# Patient Record
Sex: Male | Born: 1935
Health system: Southern US, Community
[De-identification: ages and names within clinical notes are randomized; demographics above are authoritative.]

## PROBLEM LIST (undated history)

## (undated) DIAGNOSIS — R42 Dizziness and giddiness: Secondary | ICD-10-CM

## (undated) DIAGNOSIS — E785 Hyperlipidemia, unspecified: Secondary | ICD-10-CM

## (undated) DIAGNOSIS — E119 Type 2 diabetes mellitus without complications: Secondary | ICD-10-CM

## (undated) DIAGNOSIS — H409 Unspecified glaucoma: Secondary | ICD-10-CM

## (undated) DIAGNOSIS — I219 Acute myocardial infarction, unspecified: Secondary | ICD-10-CM

## (undated) DIAGNOSIS — K219 Gastro-esophageal reflux disease without esophagitis: Secondary | ICD-10-CM

## (undated) DIAGNOSIS — I10 Essential (primary) hypertension: Secondary | ICD-10-CM

## (undated) DIAGNOSIS — M1990 Unspecified osteoarthritis, unspecified site: Secondary | ICD-10-CM

## (undated) DIAGNOSIS — C801 Malignant (primary) neoplasm, unspecified: Secondary | ICD-10-CM

## (undated) HISTORY — DX: Hyperlipidemia, unspecified: E78.5

## (undated) HISTORY — DX: Unspecified glaucoma: H40.9

## (undated) HISTORY — PX: GANGLION CYST EXCISION: SHX1691

## (undated) HISTORY — PX: CIRCUMCISION: SUR203

## (undated) HISTORY — PX: TONSILLECTOMY: SUR1361

## (undated) HISTORY — PX: CATARACT EXTRACTION W/ INTRAOCULAR LENS  IMPLANT, BILATERAL: SHX1307

## (undated) HISTORY — DX: Dizziness and giddiness: R42

## (undated) HISTORY — DX: Type 2 diabetes mellitus without complications: E11.9

## (undated) HISTORY — DX: Essential (primary) hypertension: I10

## (undated) HISTORY — DX: Unspecified osteoarthritis, unspecified site: M19.90

## (undated) HISTORY — PX: VASECTOMY: SHX75

## (undated) HISTORY — DX: Gastro-esophageal reflux disease without esophagitis: K21.9

---

## 2005-08-10 ENCOUNTER — Ambulatory Visit: Payer: Self-pay | Admitting: Urology

## 2005-11-01 ENCOUNTER — Ambulatory Visit: Payer: Self-pay | Admitting: Gastroenterology

## 2009-01-26 ENCOUNTER — Ambulatory Visit: Payer: Self-pay | Admitting: Oncology

## 2009-02-11 LAB — CMP (CANCER CENTER ONLY)
AST: 38 U/L (ref 11–38)
BUN, Bld: 16 mg/dL (ref 7–22)
Calcium: 9.6 mg/dL (ref 8.0–10.3)
Chloride: 101 mEq/L (ref 98–108)
Creat: 1.2 mg/dl (ref 0.6–1.2)
Glucose, Bld: 113 mg/dL (ref 73–118)

## 2009-02-11 LAB — CBC WITH DIFFERENTIAL (CANCER CENTER ONLY)
Eosinophils Absolute: 0.3 10*3/uL (ref 0.0–0.5)
HCT: 38.5 % — ABNORMAL LOW (ref 38.7–49.9)
LYMPH#: 1.5 10*3/uL (ref 0.9–3.3)
LYMPH%: 26.7 % (ref 14.0–48.0)
MCV: 92 fL (ref 82–98)
MONO#: 0.4 10*3/uL (ref 0.1–0.9)
NEUT%: 61.4 % (ref 40.0–80.0)
RBC: 4.2 10*6/uL (ref 4.20–5.70)
WBC: 5.7 10*3/uL (ref 4.0–10.0)

## 2009-02-11 LAB — MORPHOLOGY - CHCC SATELLITE: PLT EST ~~LOC~~: ADEQUATE

## 2009-02-13 LAB — PROTEIN ELECTROPHORESIS, SERUM
Albumin ELP: 61.8 % (ref 55.8–66.1)
Alpha-1-Globulin: 4 % (ref 2.9–4.9)
Alpha-2-Globulin: 13.8 % — ABNORMAL HIGH (ref 7.1–11.8)
Total Protein, Serum Electrophoresis: 7 g/dL (ref 6.0–8.3)

## 2009-02-13 LAB — RETICULOCYTES (CHCC)
ABS Retic: 56 10*3/uL (ref 19.0–186.0)
RBC.: 4.31 MIL/uL (ref 4.22–5.81)
Retic Ct Pct: 1.3 % (ref 0.4–3.1)

## 2009-02-13 LAB — FERRITIN: Ferritin: 343 ng/mL — ABNORMAL HIGH (ref 22–322)

## 2009-02-13 LAB — VITAMIN B12: Vitamin B-12: 1228 pg/mL — ABNORMAL HIGH (ref 211–911)

## 2009-02-13 LAB — IRON AND TIBC: TIBC: 339 ug/dL (ref 215–435)

## 2010-08-25 ENCOUNTER — Ambulatory Visit: Payer: Self-pay | Admitting: Unknown Physician Specialty

## 2011-07-14 HISTORY — PX: MELANOMA EXCISION: SHX5266

## 2013-05-23 DIAGNOSIS — Z8589 Personal history of malignant neoplasm of other organs and systems: Secondary | ICD-10-CM | POA: Insufficient documentation

## 2013-11-25 ENCOUNTER — Ambulatory Visit: Payer: Self-pay | Admitting: Family Medicine

## 2014-04-21 ENCOUNTER — Ambulatory Visit: Payer: Self-pay | Admitting: Family Medicine

## 2014-09-24 DIAGNOSIS — M3501 Sicca syndrome with keratoconjunctivitis: Secondary | ICD-10-CM | POA: Diagnosis not present

## 2014-10-02 DIAGNOSIS — D0471 Carcinoma in situ of skin of right lower limb, including hip: Secondary | ICD-10-CM | POA: Diagnosis not present

## 2014-10-02 DIAGNOSIS — Z85828 Personal history of other malignant neoplasm of skin: Secondary | ICD-10-CM | POA: Diagnosis not present

## 2014-10-02 DIAGNOSIS — D225 Melanocytic nevi of trunk: Secondary | ICD-10-CM | POA: Diagnosis not present

## 2014-10-02 DIAGNOSIS — Z85820 Personal history of malignant melanoma of skin: Secondary | ICD-10-CM | POA: Diagnosis not present

## 2014-10-02 DIAGNOSIS — L570 Actinic keratosis: Secondary | ICD-10-CM | POA: Diagnosis not present

## 2014-10-02 DIAGNOSIS — X32XXXA Exposure to sunlight, initial encounter: Secondary | ICD-10-CM | POA: Diagnosis not present

## 2014-10-02 DIAGNOSIS — D485 Neoplasm of uncertain behavior of skin: Secondary | ICD-10-CM | POA: Diagnosis not present

## 2014-10-07 DIAGNOSIS — E119 Type 2 diabetes mellitus without complications: Secondary | ICD-10-CM | POA: Diagnosis not present

## 2014-10-07 DIAGNOSIS — R740 Nonspecific elevation of levels of transaminase and lactic acid dehydrogenase [LDH]: Secondary | ICD-10-CM | POA: Diagnosis not present

## 2014-10-07 DIAGNOSIS — N400 Enlarged prostate without lower urinary tract symptoms: Secondary | ICD-10-CM | POA: Diagnosis not present

## 2014-10-07 DIAGNOSIS — M109 Gout, unspecified: Secondary | ICD-10-CM | POA: Diagnosis not present

## 2014-10-07 DIAGNOSIS — I10 Essential (primary) hypertension: Secondary | ICD-10-CM | POA: Diagnosis not present

## 2014-10-07 DIAGNOSIS — Z794 Long term (current) use of insulin: Secondary | ICD-10-CM | POA: Diagnosis not present

## 2014-10-07 DIAGNOSIS — E781 Pure hyperglyceridemia: Secondary | ICD-10-CM | POA: Diagnosis not present

## 2014-10-16 DIAGNOSIS — H1012 Acute atopic conjunctivitis, left eye: Secondary | ICD-10-CM | POA: Diagnosis not present

## 2014-11-05 DIAGNOSIS — H4011X2 Primary open-angle glaucoma, moderate stage: Secondary | ICD-10-CM | POA: Diagnosis not present

## 2014-11-18 DIAGNOSIS — D0471 Carcinoma in situ of skin of right lower limb, including hip: Secondary | ICD-10-CM | POA: Diagnosis not present

## 2015-01-08 DIAGNOSIS — E780 Pure hypercholesterolemia: Secondary | ICD-10-CM | POA: Diagnosis not present

## 2015-01-08 DIAGNOSIS — E119 Type 2 diabetes mellitus without complications: Secondary | ICD-10-CM | POA: Diagnosis not present

## 2015-01-08 DIAGNOSIS — N400 Enlarged prostate without lower urinary tract symptoms: Secondary | ICD-10-CM | POA: Diagnosis not present

## 2015-01-08 DIAGNOSIS — Z794 Long term (current) use of insulin: Secondary | ICD-10-CM | POA: Diagnosis not present

## 2015-01-08 DIAGNOSIS — I10 Essential (primary) hypertension: Secondary | ICD-10-CM | POA: Diagnosis not present

## 2015-01-08 DIAGNOSIS — E781 Pure hyperglyceridemia: Secondary | ICD-10-CM | POA: Diagnosis not present

## 2015-01-08 DIAGNOSIS — I213 ST elevation (STEMI) myocardial infarction of unspecified site: Secondary | ICD-10-CM | POA: Diagnosis not present

## 2015-01-08 DIAGNOSIS — R740 Nonspecific elevation of levels of transaminase and lactic acid dehydrogenase [LDH]: Secondary | ICD-10-CM | POA: Diagnosis not present

## 2015-01-16 DIAGNOSIS — H4011X2 Primary open-angle glaucoma, moderate stage: Secondary | ICD-10-CM | POA: Diagnosis not present

## 2015-01-22 DIAGNOSIS — H4011X2 Primary open-angle glaucoma, moderate stage: Secondary | ICD-10-CM | POA: Diagnosis not present

## 2015-03-03 ENCOUNTER — Other Ambulatory Visit: Payer: Self-pay | Admitting: Family Medicine

## 2015-03-03 ENCOUNTER — Other Ambulatory Visit: Payer: Self-pay | Admitting: *Deleted

## 2015-03-03 MED ORDER — GLUCOSE BLOOD VI STRP: ORAL_STRIP | Status: DC

## 2015-03-09 ENCOUNTER — Other Ambulatory Visit: Payer: Self-pay | Admitting: Family Medicine

## 2015-03-09 ENCOUNTER — Telehealth: Payer: Self-pay | Admitting: Family Medicine

## 2015-03-09 MED ORDER — LISINOPRIL 40 MG PO TABS: 40.0000 mg | ORAL_TABLET | Freq: Every day | ORAL | Status: DC

## 2015-03-09 NOTE — Telephone Encounter (Signed)
PT IS NEEDING REFILL ON LISINOPRIL 40MG  TAB 1 TAB EVERY DAY. [facility] IS . [facility] ORDER FOR [employer].

## 2015-03-09 NOTE — Telephone Encounter (Signed)
Sent request to Dr Manuella Ghazi

## 2015-03-10 ENCOUNTER — Telehealth: Payer: Self-pay | Admitting: Family Medicine

## 2015-03-10 NOTE — Telephone Encounter (Signed)
Pt needs [place] to reply to the rx that [facility] sent [place] to get a refill on his test strips. Pt has been out of test strips for about a week now.

## 2015-03-11 NOTE — Telephone Encounter (Signed)
Was sent on 03-03-15 by Dr Manuella Ghazi

## 2015-03-17 ENCOUNTER — Other Ambulatory Visit: Payer: Self-pay | Admitting: Emergency Medicine

## 2015-03-17 DIAGNOSIS — E119 Type 2 diabetes mellitus without complications: Secondary | ICD-10-CM

## 2015-03-17 MED ORDER — ACCU-CHEK NANO SMARTVIEW W/DEVICE KIT: 1.0000 | PACK | Freq: Once | Status: DC

## 2015-03-17 MED ORDER — FINGERSTIX LANCETS MISC: 1.0000 | Freq: Two times a day (BID) | Status: DC

## 2015-03-19 ENCOUNTER — Other Ambulatory Visit: Payer: Self-pay | Admitting: Family Medicine

## 2015-03-19 DIAGNOSIS — E119 Type 2 diabetes mellitus without complications: Secondary | ICD-10-CM

## 2015-03-19 MED ORDER — ACCU-CHEK NANO SMARTVIEW W/DEVICE KIT: 1.0000 | PACK | Freq: Once | Status: DC

## 2015-03-19 NOTE — Telephone Encounter (Signed)
Spoke with patient he stated that he received test strips but they do not fit meter that he currently has, I sent in script for proper meter and pt will pick up at [facility] on [address].

## 2015-03-20 ENCOUNTER — Telehealth: Payer: Self-pay | Admitting: Family Medicine

## 2015-03-20 NOTE — Telephone Encounter (Signed)
Patient will come to clinic to pick up script for meter. Patient next appt is 04/09/2015.

## 2015-03-20 NOTE — Telephone Encounter (Signed)
Requesting a return call [phone]

## 2015-03-27 DIAGNOSIS — L570 Actinic keratosis: Secondary | ICD-10-CM | POA: Diagnosis not present

## 2015-03-27 DIAGNOSIS — X32XXXA Exposure to sunlight, initial encounter: Secondary | ICD-10-CM | POA: Diagnosis not present

## 2015-03-27 DIAGNOSIS — Z85828 Personal history of other malignant neoplasm of skin: Secondary | ICD-10-CM | POA: Diagnosis not present

## 2015-03-27 DIAGNOSIS — Z85820 Personal history of malignant melanoma of skin: Secondary | ICD-10-CM | POA: Diagnosis not present

## 2015-03-27 DIAGNOSIS — S30861A Insect bite (nonvenomous) of abdominal wall, initial encounter: Secondary | ICD-10-CM | POA: Diagnosis not present

## 2015-04-09 ENCOUNTER — Ambulatory Visit (INDEPENDENT_AMBULATORY_CARE_PROVIDER_SITE_OTHER): Payer: Commercial Managed Care - HMO | Admitting: Family Medicine

## 2015-04-09 ENCOUNTER — Encounter: Payer: Self-pay | Admitting: Family Medicine

## 2015-04-09 VITALS — BP 128/74 | HR 87 | Temp 98.5°F | Resp 17 | Ht 70.0 in | Wt 179.6 lb

## 2015-04-09 DIAGNOSIS — I10 Essential (primary) hypertension: Secondary | ICD-10-CM

## 2015-04-09 DIAGNOSIS — E781 Pure hyperglyceridemia: Secondary | ICD-10-CM | POA: Diagnosis not present

## 2015-04-09 DIAGNOSIS — Z9889 Other specified postprocedural states: Secondary | ICD-10-CM

## 2015-04-09 DIAGNOSIS — Z85820 Personal history of malignant melanoma of skin: Secondary | ICD-10-CM | POA: Insufficient documentation

## 2015-04-09 DIAGNOSIS — E785 Hyperlipidemia, unspecified: Secondary | ICD-10-CM | POA: Diagnosis not present

## 2015-04-09 DIAGNOSIS — Z794 Long term (current) use of insulin: Secondary | ICD-10-CM | POA: Diagnosis not present

## 2015-04-09 DIAGNOSIS — E119 Type 2 diabetes mellitus without complications: Secondary | ICD-10-CM

## 2015-04-09 DIAGNOSIS — K219 Gastro-esophageal reflux disease without esophagitis: Secondary | ICD-10-CM | POA: Insufficient documentation

## 2015-04-09 DIAGNOSIS — R809 Proteinuria, unspecified: Secondary | ICD-10-CM

## 2015-04-09 DIAGNOSIS — E1129 Type 2 diabetes mellitus with other diabetic kidney complication: Secondary | ICD-10-CM | POA: Insufficient documentation

## 2015-04-09 DIAGNOSIS — N400 Enlarged prostate without lower urinary tract symptoms: Secondary | ICD-10-CM | POA: Insufficient documentation

## 2015-04-09 MED ORDER — LISINOPRIL 40 MG PO TABS: 40.0000 mg | ORAL_TABLET | Freq: Every day | ORAL | Status: DC

## 2015-04-09 NOTE — Progress Notes (Signed)
Name: JASIR ROTHER   MRN: [id number]    DOB: [birth year]   Date:04/09/2015       Progress Note  Subjective  Chief Complaint  Chief Complaint  Patient presents with  . Follow-up    3 mo./Fasting  . Diabetes  . Gastrophageal Reflux  . Hypertension    Diabetes He presents for his follow-up diabetic visit. He has type 2 diabetes mellitus. His disease course has been stable. Pertinent negatives for hypoglycemia include no headaches. Pertinent negatives for diabetes include no chest pain. Pertinent negatives for diabetic complications include no CVA. Current diabetic treatment includes intensive insulin program and oral agent (dual therapy). His breakfast blood glucose range is generally 110-130 mg/dl. An ACE inhibitor/angiotensin II receptor blocker is being taken. Eye exam is current.  Hypertension This is a chronic problem. The problem is controlled. Pertinent negatives include no chest pain, headaches, palpitations or shortness of breath. Past treatments include calcium channel blockers and ACE inhibitors. Hypertensive end-organ damage includes CAD/MI. There is no history of angina or CVA.  Hyperlipidemia This is a chronic problem. Recent lipid tests were reviewed and are high. Exacerbating diseases include diabetes. Pertinent negatives include no chest pain, leg pain, myalgias or shortness of breath. Current antihyperlipidemic treatment includes statins. The current treatment provides moderate improvement of lipids. Risk factors for coronary artery disease include diabetes mellitus, dyslipidemia, male sex and hypertension.      Past Medical History  Diagnosis Date  . Diabetes mellitus without complication   . Hypertension   . GERD (gastroesophageal reflux disease)   . Arthritis     Past Surgical History  Procedure Laterality Date  . Melanoma excision  07/2011  . Tonsillectomy    . Circumcision      Family History  Problem Relation Age of Onset  . Heart disease Mother   .  Heart disease Brother     History   Social History  . Marital Status: Married    Spouse Name: N/A  . Number of Children: N/A  . Years of Education: N/A   Occupational History  . Not on file.   Social History Main Topics  . Smoking status: Former Smoker    Types: Cigarettes  . Smokeless tobacco: Never Used  . Alcohol Use: 0.0 oz/week    0 Standard drinks or equivalent per week     Comment: occasional  . Drug Use: No  . Sexual Activity: Not on file   Other Topics Concern  . Not on file   Social History Narrative  . No narrative on file     Current outpatient prescriptions:  .  allopurinol (ZYLOPRIM) 100 MG tablet, Take 1 tablet by mouth 2 (two) times daily., Disp: , Rfl:  .  amLODipine (NORVASC) 5 MG tablet, Take 1 tablet by mouth every morning., Disp: , Rfl:  .  aspirin 81 MG tablet, Take 81 mg by mouth., Disp: , Rfl:  .  atorvastatin (LIPITOR) 10 MG tablet, Take 5 mg by mouth daily., Disp: , Rfl:  .  Blood Glucose Monitoring Suppl (ACCU-CHEK NANO SMARTVIEW) W/DEVICE KIT, 1 Device by Does not apply route once., Disp: 1 kit, Rfl: 0 .  dorzolamide (TRUSOPT) 2 % ophthalmic solution, , Disp: , Rfl:  .  doxazosin (CARDURA) 4 MG tablet, Take 4 mg by mouth daily., Disp: , Rfl:  .  Ergocalciferol (VITAMIN D2) 2000 UNITS TABS, Take by mouth., Disp: , Rfl:  .  finasteride (PROSCAR) 5 MG tablet, Take 5 mg by mouth daily., Disp: ,  Name: Mahlon G Larocque   MRN: [id number]    DOB: 09/29/1929   Date:04/09/2015       Progress Note  Subjective  Chief Complaint  Chief Complaint  Patient presents with  . Follow-up    3 mo./Fasting  . Diabetes  . Gastrophageal Reflux  . Hypertension    Diabetes He presents for his follow-up diabetic visit. He has type 2 diabetes mellitus. His disease course has been stable. Pertinent negatives for hypoglycemia include no headaches. Pertinent negatives for diabetes include no chest pain. Pertinent negatives for diabetic complications include no CVA. Current diabetic treatment includes intensive insulin program and oral agent (dual therapy). His breakfast blood glucose range is generally 110-130 mg/dl. An ACE inhibitor/angiotensin II receptor blocker is being taken. Eye exam is current.  Hypertension This is a chronic problem. The problem is controlled. Pertinent negatives include no chest pain, headaches, palpitations or shortness of breath. Past treatments include calcium channel blockers and ACE inhibitors. Hypertensive end-organ damage includes CAD/MI. There is no history of angina or CVA.  Hyperlipidemia This is a chronic problem. Recent lipid tests were reviewed and are high. Exacerbating diseases include diabetes. Pertinent negatives include no chest pain, leg pain, myalgias or shortness of breath. Current antihyperlipidemic treatment includes statins. The current treatment provides moderate improvement of lipids. Risk factors for coronary artery disease include diabetes mellitus, dyslipidemia, male sex and hypertension.      Past Medical History  Diagnosis Date  . Diabetes mellitus without complication   . Hypertension   . GERD (gastroesophageal reflux disease)   . Arthritis     Past Surgical History  Procedure Laterality Date  . Melanoma excision  07/2011  . Tonsillectomy    . Circumcision      Family History  Problem Relation Age of Onset  . Heart disease Mother   .  Heart disease Brother     History   Social History  . Marital Status: Married    Spouse Name: N/A  . Number of Children: N/A  . Years of Education: N/A   Occupational History  . Not on file.   Social History Main Topics  . Smoking status: Former Smoker    Types: Cigarettes  . Smokeless tobacco: Never Used  . Alcohol Use: 0.0 oz/week    0 Standard drinks or equivalent per week     Comment: occasional  . Drug Use: No  . Sexual Activity: Not on file   Other Topics Concern  . Not on file   Social History Narrative  . No narrative on file     Current outpatient prescriptions:  .  allopurinol (ZYLOPRIM) 100 MG tablet, Take 1 tablet by mouth 2 (two) times daily., Disp: , Rfl:  .  amLODipine (NORVASC) 5 MG tablet, Take 1 tablet by mouth every morning., Disp: , Rfl:  .  aspirin 81 MG tablet, Take 81 mg by mouth., Disp: , Rfl:  .  atorvastatin (LIPITOR) 10 MG tablet, Take 5 mg by mouth daily., Disp: , Rfl:  .  Blood Glucose Monitoring Suppl (ACCU-CHEK NANO SMARTVIEW) W/DEVICE KIT, 1 Device by Does not apply route once., Disp: 1 kit, Rfl: 0 .  dorzolamide (TRUSOPT) 2 % ophthalmic solution, , Disp: , Rfl:  .  doxazosin (CARDURA) 4 MG tablet, Take 4 mg by mouth daily., Disp: , Rfl:  .  Ergocalciferol (VITAMIN D2) 2000 UNITS TABS, Take by mouth., Disp: , Rfl:  .  finasteride (PROSCAR) 5 MG tablet, Take 5 mg by mouth daily., Disp: ,

## 2015-04-10 LAB — LIPID PANEL
CHOL/HDL RATIO: 3.1 ratio (ref 0.0–5.0)
Cholesterol, Total: 121 mg/dL (ref 100–199)
HDL: 39 mg/dL — ABNORMAL LOW (ref >39)
LDL CALC: 6 mg/dL (ref 0–99)
Triglycerides: 380 mg/dL — ABNORMAL HIGH (ref 0–149)
VLDL Cholesterol Cal: 76 mg/dL — ABNORMAL HIGH (ref 5–40)

## 2015-04-10 LAB — COMPREHENSIVE METABOLIC PANEL
A/G RATIO: 2 (ref 1.1–2.5)
ALBUMIN: 4.4 g/dL (ref 3.5–4.7)
ALK PHOS: 43 IU/L (ref 39–117)
ALT: 54 IU/L — AB (ref 0–44)
AST: 31 IU/L (ref 0–40)
BILIRUBIN TOTAL: 0.4 mg/dL (ref 0.0–1.2)
BUN/Creatinine Ratio: 18 (ref 10–22)
BUN: 19 mg/dL (ref 8–27)
CO2: 21 mmol/L (ref 18–29)
CREATININE: 1.04 mg/dL (ref 0.76–1.27)
Calcium: 9.3 mg/dL (ref 8.6–10.2)
Chloride: 101 mmol/L (ref 97–108)
GFR calc Af Amer: 77 mL/min/{1.73_m2} (ref >59)
GFR calc non Af Amer: 67 mL/min/{1.73_m2} (ref >59)
GLOBULIN, TOTAL: 2.2 g/dL (ref 1.5–4.5)
GLUCOSE: 154 mg/dL — AB (ref 65–99)
Potassium: 4.7 mmol/L (ref 3.5–5.2)
Sodium: 138 mmol/L (ref 134–144)
Total Protein: 6.6 g/dL (ref 6.0–8.5)

## 2015-04-10 LAB — HEMOGLOBIN A1C
Est. average glucose Bld gHb Est-mCnc: 166 mg/dL
HEMOGLOBIN A1C: 7.4 % — AB (ref 4.8–5.6)

## 2015-04-14 ENCOUNTER — Telehealth: Payer: Self-pay

## 2015-04-14 NOTE — Telephone Encounter (Signed)
Left vm  Of lab results f/u in 4 weeks

## 2015-05-15 ENCOUNTER — Other Ambulatory Visit: Payer: Self-pay | Admitting: Family Medicine

## 2015-05-26 DIAGNOSIS — H4011X2 Primary open-angle glaucoma, moderate stage: Secondary | ICD-10-CM | POA: Diagnosis not present

## 2015-06-30 DIAGNOSIS — E785 Hyperlipidemia, unspecified: Secondary | ICD-10-CM | POA: Diagnosis not present

## 2015-06-30 DIAGNOSIS — E119 Type 2 diabetes mellitus without complications: Secondary | ICD-10-CM | POA: Diagnosis not present

## 2015-06-30 DIAGNOSIS — Z794 Long term (current) use of insulin: Secondary | ICD-10-CM | POA: Diagnosis not present

## 2015-06-30 DIAGNOSIS — H409 Unspecified glaucoma: Secondary | ICD-10-CM | POA: Diagnosis not present

## 2015-06-30 DIAGNOSIS — M19042 Primary osteoarthritis, left hand: Secondary | ICD-10-CM | POA: Diagnosis not present

## 2015-06-30 DIAGNOSIS — K219 Gastro-esophageal reflux disease without esophagitis: Secondary | ICD-10-CM | POA: Diagnosis not present

## 2015-06-30 DIAGNOSIS — M19041 Primary osteoarthritis, right hand: Secondary | ICD-10-CM | POA: Diagnosis not present

## 2015-06-30 DIAGNOSIS — Z7982 Long term (current) use of aspirin: Secondary | ICD-10-CM | POA: Diagnosis not present

## 2015-06-30 DIAGNOSIS — H3530 Unspecified macular degeneration: Secondary | ICD-10-CM | POA: Diagnosis not present

## 2015-06-30 DIAGNOSIS — E118 Type 2 diabetes mellitus with unspecified complications: Secondary | ICD-10-CM | POA: Diagnosis not present

## 2015-06-30 DIAGNOSIS — Z23 Encounter for immunization: Secondary | ICD-10-CM | POA: Diagnosis not present

## 2015-06-30 DIAGNOSIS — I10 Essential (primary) hypertension: Secondary | ICD-10-CM | POA: Diagnosis not present

## 2015-07-13 ENCOUNTER — Encounter: Payer: Self-pay | Admitting: Family Medicine

## 2015-07-13 ENCOUNTER — Ambulatory Visit (INDEPENDENT_AMBULATORY_CARE_PROVIDER_SITE_OTHER): Payer: Commercial Managed Care - HMO | Admitting: Family Medicine

## 2015-07-13 VITALS — BP 138/60 | HR 71 | Temp 98.1°F | Resp 18 | Ht 70.0 in | Wt 179.0 lb

## 2015-07-13 DIAGNOSIS — I10 Essential (primary) hypertension: Secondary | ICD-10-CM | POA: Diagnosis not present

## 2015-07-13 DIAGNOSIS — R740 Nonspecific elevation of levels of transaminase and lactic acid dehydrogenase [LDH]: Secondary | ICD-10-CM | POA: Diagnosis not present

## 2015-07-13 DIAGNOSIS — E785 Hyperlipidemia, unspecified: Secondary | ICD-10-CM | POA: Diagnosis not present

## 2015-07-13 DIAGNOSIS — R1031 Right lower quadrant pain: Secondary | ICD-10-CM | POA: Diagnosis not present

## 2015-07-13 DIAGNOSIS — R7401 Elevation of levels of liver transaminase levels: Secondary | ICD-10-CM | POA: Insufficient documentation

## 2015-07-13 DIAGNOSIS — E781 Pure hyperglyceridemia: Secondary | ICD-10-CM

## 2015-07-13 DIAGNOSIS — M19049 Primary osteoarthritis, unspecified hand: Secondary | ICD-10-CM | POA: Insufficient documentation

## 2015-07-13 DIAGNOSIS — E119 Type 2 diabetes mellitus without complications: Secondary | ICD-10-CM | POA: Insufficient documentation

## 2015-07-13 DIAGNOSIS — E1140 Type 2 diabetes mellitus with diabetic neuropathy, unspecified: Secondary | ICD-10-CM | POA: Insufficient documentation

## 2015-07-13 DIAGNOSIS — H409 Unspecified glaucoma: Secondary | ICD-10-CM | POA: Insufficient documentation

## 2015-07-13 DIAGNOSIS — Z9181 History of falling: Secondary | ICD-10-CM | POA: Insufficient documentation

## 2015-07-13 DIAGNOSIS — M1990 Unspecified osteoarthritis, unspecified site: Secondary | ICD-10-CM | POA: Insufficient documentation

## 2015-07-13 DIAGNOSIS — M109 Gout, unspecified: Secondary | ICD-10-CM | POA: Insufficient documentation

## 2015-07-13 DIAGNOSIS — Z794 Long term (current) use of insulin: Secondary | ICD-10-CM

## 2015-07-13 NOTE — Progress Notes (Signed)
Name: Tracy [name]   MRN: [id number]    DOB: [birth year]   Date:07/13/2015       Progress Note  Subjective  Chief Complaint  Chief Complaint  Patient presents with  . Hypertension    3 month follow up   . Diabetes    Seen by Endocrinologist on June 30, 2015  . Hyperlipidemia    Hypertension This is a chronic problem. The problem is controlled. Pertinent negatives include no chest pain, headaches, palpitations or shortness of breath. Risk factors for coronary artery disease include diabetes mellitus, dyslipidemia and male gender. Past treatments include calcium channel blockers. Hypertensive end-organ damage includes CAD/MI. There is no history of kidney disease.  Hyperlipidemia This is a chronic problem. The problem is uncontrolled. Recent lipid tests were reviewed and are high (elevated TG). Exacerbating diseases include diabetes. Pertinent negatives include no chest pain, leg pain, myalgias or shortness of breath. Current antihyperlipidemic treatment includes statins (OTC Fish oil).   Pt. Also concerned about a pressure-like sensation on his right lower quadrant occasionally. No pain or other symptoms but is concerned about a hernia. No changes in urine or stool.  Past Medical History  Diagnosis Date  . Diabetes mellitus without complication (HCC)   . Hypertension   . GERD (gastroesophageal reflux disease)   . Arthritis     Past Surgical History  Procedure Laterality Date  . Melanoma excision  07/2011  . Tonsillectomy    . Circumcision      Family History  Problem Relation Age of Onset  . Heart disease Mother   . Heart disease Brother     Social History   Social History  . Marital Status: Married    Spouse Name: N/A  . Number of Children: N/A  . Years of Education: N/A   Occupational History  . Not on file.   Social History Main Topics  . Smoking status: Former Smoker    Types: Cigarettes  . Smokeless tobacco: Never Used  . Alcohol Use: 0.0 oz/week     0 Standard drinks or equivalent per week     Comment: occasional  . Drug Use: No  . Sexual Activity: Not on file   Other Topics Concern  . Not on file   Social History Narrative     Current outpatient prescriptions:  .  ACCU-CHEK FASTCLIX LANCETS MISC, CHECK  BLOOD  SUGAR TWICE DAILY, Disp: 204 each, Rfl: 1 .  allopurinol (ZYLOPRIM) 100 MG tablet, Take 1 tablet by mouth 2 (two) times daily., Disp: , Rfl:  .  amLODipine (NORVASC) 5 MG tablet, Take 1 tablet by mouth every morning., Disp: , Rfl:  .  aspirin 81 MG tablet, Take 81 mg by mouth., Disp: , Rfl:  .  atorvastatin (LIPITOR) 10 MG tablet, Take 5 mg by mouth daily., Disp: , Rfl:  .  Blood Glucose Monitoring Suppl (ACCU-CHEK NANO SMARTVIEW) W/DEVICE KIT, USE AS DIRECTED, Disp: 1 kit, Rfl: 0 .  dorzolamide (TRUSOPT) 2 % ophthalmic solution, , Disp: , Rfl:  .  doxazosin (CARDURA) 4 MG tablet, Take 4 mg by mouth daily., Disp: , Rfl:  .  Ergocalciferol (VITAMIN D2) 2000 UNITS TABS, Take by mouth., Disp: , Rfl:  .  finasteride (PROSCAR) 5 MG tablet, Take 5 mg by mouth daily., Disp: , Rfl:  .  glipiZIDE (GLUCOTROL) 10 MG tablet, Take 1 tablet by mouth 2 (two) times daily., Disp: , Rfl:  .  glucose blood (COOL BLOOD GLUCOSE TEST STRIPS) test strip, Use as  instructed, Disp: 100 each, Rfl: 12 .  Insulin Glargine (LANTUS SOLOSTAR) 100 UNIT/ML Solostar Pen, 40 units  At bedtime. 30 days = 5 pens., Disp: , Rfl:  .  Insulin Pen Needle (B-D ULTRAFINE III SHORT PEN) 31G X 8 MM MISC, , Disp: , Rfl:  .  lisinopril (PRINIVIL,ZESTRIL) 40 MG tablet, Take 1 tablet (40 mg total) by mouth daily., Disp: 90 tablet, Rfl: 1 .  metFORMIN (GLUCOPHAGE-XR) 750 MG 24 hr tablet, Take 1 tablet by mouth 2 (two) times daily., Disp: , Rfl:  .  Multiple Vitamin (DAILY VITAMINS) tablet, Take by mouth., Disp: , Rfl:  .  NEEDLE, DISP, 14 G 14G X 1" MISC, , Disp: , Rfl:  .  timolol (TIMOPTIC) 0.5 % ophthalmic solution, Frequency:QD   Dosage:0.0     Instructions:   Note:Dose: 0.5 %, OS, Disp: , Rfl:  .  TOLAK 4 % CREA, , Disp: , Rfl:   Allergies  Allergen Reactions  . Tetracyclines & Related Other (See Comments)   Review of Systems  Constitutional: Negative for fever and chills.  Respiratory: Negative for cough and shortness of breath.   Cardiovascular: Negative for chest pain, palpitations and leg swelling.  Musculoskeletal: Negative for myalgias.  Neurological: Negative for headaches.   Objective  Filed Vitals:   07/13/15 0817  BP: 138/60  Pulse: 71  Temp: 98.1 F (36.7 C)  TempSrc: Oral  Resp: 18  Height: 5\' 10"  (1.778 m)  Weight: 179 lb (81.194 kg)  SpO2: 96%    Physical Exam  Constitutional: He is oriented to person, place, and time and well-developed, well-nourished, and in no distress.  Cardiovascular: Normal rate, regular rhythm and normal heart sounds.   No murmur heard. Pulmonary/Chest: Effort normal and breath sounds normal. He has no wheezes. He has no rales.  Abdominal: Soft. Bowel sounds are normal. There is no tenderness. No hernia.  No tenderness to palpation in the right lower quadrant, and right groin, which is the area of concern. Normal Bowel sounds throughout.  Neurological: He is alert and oriented to person, place, and time.  Skin: Skin is warm and dry.  Nursing note and vitals reviewed.   Assessment & Plan  1. Essential hypertension Blood pressure is well controlled on present therapy.  2. Hypertriglyceridemia Currently on OTC fish oil. If the TGs are persistently elevated, will need to start on Rx-strength fish oil. - Lipid Profile  3. ALT (SGPT) level raised  - Comprehensive Metabolic Panel (CMET)  4. Dyslipidemia Recheck lipids today. Continue on statin therapy.   5. Abdominal discomfort in right lower quadrant No Bulging or other abnormality found on abdominal and groin exam. Pt. Reassured.    Leandro Berkowitz Asad [facility] 07/13/2015 8:24  AM

## 2015-07-14 LAB — COMPREHENSIVE METABOLIC PANEL
A/G RATIO: 2.1 (ref 1.1–2.5)
ALT: 42 IU/L (ref 0–44)
AST: 22 IU/L (ref 0–40)
Albumin: 4.4 g/dL (ref 3.5–4.7)
Alkaline Phosphatase: 42 IU/L (ref 39–117)
BILIRUBIN TOTAL: 0.4 mg/dL (ref 0.0–1.2)
BUN / CREAT RATIO: 13 (ref 10–22)
BUN: 14 mg/dL (ref 8–27)
CHLORIDE: 102 mmol/L (ref 97–106)
CO2: 23 mmol/L (ref 18–29)
Calcium: 9.1 mg/dL (ref 8.6–10.2)
Creatinine, Ser: 1.07 mg/dL (ref 0.76–1.27)
GFR, EST AFRICAN AMERICAN: 74 mL/min/{1.73_m2} (ref >59)
GFR, EST NON AFRICAN AMERICAN: 64 mL/min/{1.73_m2} (ref >59)
GLOBULIN, TOTAL: 2.1 g/dL (ref 1.5–4.5)
Glucose: 167 mg/dL — ABNORMAL HIGH (ref 65–99)
POTASSIUM: 4.5 mmol/L (ref 3.5–5.2)
SODIUM: 141 mmol/L (ref 136–144)
TOTAL PROTEIN: 6.5 g/dL (ref 6.0–8.5)

## 2015-07-14 LAB — LIPID PANEL
CHOLESTEROL TOTAL: 111 mg/dL (ref 100–199)
Chol/HDL Ratio: 2.6 ratio units (ref 0.0–5.0)
HDL: 43 mg/dL (ref >39)
LDL Calculated: 26 mg/dL (ref 0–99)
TRIGLYCERIDES: 208 mg/dL — AB (ref 0–149)
VLDL Cholesterol Cal: 42 mg/dL — ABNORMAL HIGH (ref 5–40)

## 2015-07-15 ENCOUNTER — Telehealth: Payer: Self-pay | Admitting: Emergency Medicine

## 2015-07-15 NOTE — Telephone Encounter (Signed)
Spoke to wife and gave results.

## 2015-09-23 DIAGNOSIS — H401132 Primary open-angle glaucoma, bilateral, moderate stage: Secondary | ICD-10-CM | POA: Diagnosis not present

## 2015-10-01 ENCOUNTER — Telehealth: Payer: Self-pay | Admitting: Family Medicine

## 2015-10-01 NOTE — Telephone Encounter (Signed)
I did not look in my box til 12:15 and I don't recall, how to even do?

## 2015-10-01 NOTE — Telephone Encounter (Signed)
Patient has appointment at 8:30 with Dr Evorn Gong at [facility] on 10-01-15. They are needing a referral to be sent over today. Darrick Penna is not in the office today

## 2015-10-02 DIAGNOSIS — B079 Viral wart, unspecified: Secondary | ICD-10-CM | POA: Diagnosis not present

## 2015-10-02 DIAGNOSIS — L570 Actinic keratosis: Secondary | ICD-10-CM | POA: Diagnosis not present

## 2015-10-02 DIAGNOSIS — D485 Neoplasm of uncertain behavior of skin: Secondary | ICD-10-CM | POA: Diagnosis not present

## 2015-10-02 DIAGNOSIS — X32XXXA Exposure to sunlight, initial encounter: Secondary | ICD-10-CM | POA: Diagnosis not present

## 2015-10-02 DIAGNOSIS — L820 Inflamed seborrheic keratosis: Secondary | ICD-10-CM | POA: Diagnosis not present

## 2015-10-02 DIAGNOSIS — Z85828 Personal history of other malignant neoplasm of skin: Secondary | ICD-10-CM | POA: Diagnosis not present

## 2015-10-02 DIAGNOSIS — Z85820 Personal history of malignant melanoma of skin: Secondary | ICD-10-CM | POA: Diagnosis not present

## 2015-10-08 NOTE — Telephone Encounter (Signed)
Spoke with Raquel Sarna at [facility] since she couldn't ahold of me or [place], she went on [employer] and did the [facility] Referral herself. So, this is taken care of.

## 2015-10-13 ENCOUNTER — Ambulatory Visit (INDEPENDENT_AMBULATORY_CARE_PROVIDER_SITE_OTHER): Payer: Commercial Managed Care - HMO | Admitting: Family Medicine

## 2015-10-13 ENCOUNTER — Encounter: Payer: Self-pay | Admitting: Family Medicine

## 2015-10-13 VITALS — BP 134/63 | HR 83 | Temp 98.4°F | Resp 19 | Ht 70.0 in | Wt 181.6 lb

## 2015-10-13 DIAGNOSIS — Z794 Long term (current) use of insulin: Secondary | ICD-10-CM

## 2015-10-13 DIAGNOSIS — E781 Pure hyperglyceridemia: Secondary | ICD-10-CM

## 2015-10-13 DIAGNOSIS — I10 Essential (primary) hypertension: Secondary | ICD-10-CM | POA: Diagnosis not present

## 2015-10-13 DIAGNOSIS — E119 Type 2 diabetes mellitus without complications: Secondary | ICD-10-CM | POA: Diagnosis not present

## 2015-10-13 DIAGNOSIS — N400 Enlarged prostate without lower urinary tract symptoms: Secondary | ICD-10-CM

## 2015-10-13 LAB — GLUCOSE, POCT (MANUAL RESULT ENTRY): POC GLUCOSE: 161 mg/dL — AB (ref 70–99)

## 2015-10-13 LAB — POCT GLYCOSYLATED HEMOGLOBIN (HGB A1C): Hemoglobin A1C: 6.9

## 2015-10-13 MED ORDER — DOXAZOSIN MESYLATE 4 MG PO TABS: 4.0000 mg | ORAL_TABLET | Freq: Every day | ORAL | Status: DC

## 2015-10-13 MED ORDER — FINASTERIDE 5 MG PO TABS: 5.0000 mg | ORAL_TABLET | Freq: Every day | ORAL | Status: DC

## 2015-10-13 MED ORDER — AMLODIPINE BESYLATE 5 MG PO TABS: 5.0000 mg | ORAL_TABLET | Freq: Every day | ORAL | Status: DC

## 2015-10-13 NOTE — Progress Notes (Signed)
Name: Tracy [name]   MRN: [id number]    DOB: 18-May-1930   Date:10/13/2015       Progress Note  Subjective  Chief Complaint  Chief Complaint  Patient presents with  . Follow-up    3 mo  . Diabetes  . Hyperlipidemia  . Gout  . Medication Refill    amlodipine 5 mg / finasteride 5 mg / doxazosin 4 mg     Diabetes He presents for his follow-up diabetic visit. He has type 2 diabetes mellitus. His disease course has been stable. Hypoglycemia symptoms include dizziness, hunger and sweats. Pertinent negatives for hypoglycemia include no headaches. Pertinent negatives for diabetes include no blurred vision, no chest pain, no fatigue, no foot paresthesias, no polydipsia and no polyuria. Current diabetic treatment includes intensive insulin program and oral agent (dual therapy). He is following a diabetic diet. His breakfast blood glucose range is generally 110-130 mg/dl. An ACE inhibitor/angiotensin II receptor blocker is being taken.  Hyperlipidemia This is a chronic problem. The problem is uncontrolled. Recent lipid tests were reviewed and are high (Elevated Triglycerides). Pertinent negatives include no chest pain, leg pain, myalgias or shortness of breath. Current antihyperlipidemic treatment includes statins and fibric acid derivatives.  Benign Prostatic Hypertrophy This is a chronic problem. The problem is unchanged. Irritative symptoms do not include frequency, nocturia or urgency. Obstructive symptoms include incomplete emptying (sometimes he has to go twice in the morning to empty his bladder.). Obstructive symptoms do not include dribbling. Pertinent negatives include no dysuria or hematuria. Past treatments include doxazosin and finasteride. The treatment provided significant relief.  Hypertension This is a chronic problem. The problem is controlled. Associated symptoms include sweats. Pertinent negatives include no blurred vision, chest pain, headaches, palpitations or shortness of  breath. Past treatments include calcium channel blockers and ACE inhibitors. Hypertensive end-organ damage includes CAD/MI. There is no history of kidney disease.     Past Medical History  Diagnosis Date  . Diabetes mellitus without complication (HCC)   . Hypertension   . GERD (gastroesophageal reflux disease)   . Arthritis     Past Surgical History  Procedure Laterality Date  . Melanoma excision  07/2011  . Tonsillectomy    . Circumcision      Family History  Problem Relation Age of Onset  . Heart disease Mother   . Heart disease Brother     Social History   Social History  . Marital Status: Married    Spouse Name: N/A  . Number of Children: N/A  . Years of Education: N/A   Occupational History  . Not on file.   Social History Main Topics  . Smoking status: Former Smoker    Types: Cigarettes  . Smokeless tobacco: Never Used  . Alcohol Use: 0.0 oz/week    0 Standard drinks or equivalent per week     Comment: occasional  . Drug Use: No  . Sexual Activity: Not on file   Other Topics Concern  . Not on file   Social History Narrative     Current outpatient prescriptions:  .  ACCU-CHEK FASTCLIX LANCETS MISC, CHECK  BLOOD  SUGAR TWICE DAILY, Disp: 204 each, Rfl: 1 .  allopurinol (ZYLOPRIM) 100 MG tablet, Take 1 tablet by mouth 2 (two) times daily., Disp: , Rfl:  .  amLODipine (NORVASC) 5 MG tablet, Take 1 tablet by mouth every morning., Disp: , Rfl:  .  aspirin 81 MG tablet, Take 81 mg by mouth., Disp: , Rfl:  .  atorvastatin (LIPITOR) 10 MG tablet, Take 5 mg by mouth daily., Disp: , Rfl:  .  Blood Glucose Monitoring Suppl (ACCU-CHEK NANO SMARTVIEW) W/DEVICE KIT, USE AS DIRECTED, Disp: 1 kit, Rfl: 0 .  dorzolamide (TRUSOPT) 2 % ophthalmic solution, , Disp: , Rfl:  .  doxazosin (CARDURA) 4 MG tablet, Take 4 mg by mouth daily., Disp: , Rfl:  .  finasteride (PROSCAR) 5 MG tablet, Take 5 mg by mouth daily., Disp: , Rfl:  .  glipiZIDE (GLUCOTROL) 10 MG tablet, Take  1 tablet by mouth 2 (two) times daily., Disp: , Rfl:  .  glucose blood (COOL BLOOD GLUCOSE TEST STRIPS) test strip, Use as instructed, Disp: 100 each, Rfl: 12 .  Insulin Glargine (LANTUS SOLOSTAR) 100 UNIT/ML Solostar Pen, 40 units  At bedtime. 30 days = 5 pens., Disp: , Rfl:  .  Insulin Pen Needle (B-D ULTRAFINE III SHORT PEN) 31G X 8 MM MISC, , Disp: , Rfl:  .  lisinopril (PRINIVIL,ZESTRIL) 40 MG tablet, Take 1 tablet (40 mg total) by mouth daily., Disp: 90 tablet, Rfl: 1 .  metFORMIN (GLUCOPHAGE-XR) 750 MG 24 hr tablet, Take 1 tablet by mouth 2 (two) times daily., Disp: , Rfl:  .  Multiple Vitamin (DAILY VITAMINS) tablet, Take by mouth., Disp: , Rfl:  .  NEEDLE, DISP, 14 G 14G X 1" MISC, , Disp: , Rfl:  .  timolol (TIMOPTIC) 0.5 % ophthalmic solution, Frequency:QD   Dosage:0.0     Instructions:  Note:Dose: 0.5 %, OS, Disp: , Rfl:  .  TOLAK 4 % CREA, , Disp: , Rfl:  .  Ergocalciferol (VITAMIN D2) 2000 UNITS TABS, Take by mouth. Reported on 10/13/2015, Disp: , Rfl:   Allergies  Allergen Reactions  . Tetracyclines & Related Other (See Comments)     Review of Systems  Constitutional: Negative for fatigue.  Eyes: Negative for blurred vision.  Respiratory: Negative for shortness of breath.   Cardiovascular: Negative for chest pain and palpitations.  Genitourinary: Positive for incomplete emptying (sometimes he has to go twice in the morning to empty his bladder.). Negative for dysuria, urgency, frequency, hematuria and nocturia.  Musculoskeletal: Negative for myalgias.  Neurological: Positive for dizziness. Negative for headaches.  Endo/Heme/Allergies: Negative for polydipsia.    Objective  Filed Vitals:   10/13/15 0835  BP: 134/63  Pulse: 83  Temp: 98.4 F (36.9 C)  TempSrc: Oral  Resp: 19  Height: 5\' 10"  (1.778 m)  Weight: 181 lb 9.6 oz (82.373 kg)  SpO2: 96%    Physical Exam  Constitutional: He is oriented to person, place, and time and well-developed, well-nourished, and  in no distress.  Cardiovascular: Normal rate, regular rhythm and normal heart sounds.   No murmur heard. Pulmonary/Chest: Effort normal and breath sounds normal. He has no wheezes.  Occasional crackles at the bases  Abdominal: Soft. Bowel sounds are normal.  Genitourinary: Rectum normal. Prostate is enlarged. Prostate is not tender.  Smooth contoured prostate  Neurological: He is alert and oriented to person, place, and time.  Nursing note and vitals reviewed.    Assessment & Plan  1. Essential hypertension BP stable and controlled on therapy. - amLODipine (NORVASC) 5 MG tablet; Take 1 tablet (5 mg total) by mouth daily.  Dispense: 90 tablet; Refill: 0  2. Type 2 diabetes mellitus treated with insulin (HCC) Now on 44 units of Lantus, along with glipizide and metformin. A1c 6.9%, stable and controlled, at goal. - POCT HgB A1C - POCT Glucose (CBG) - Urine  Microalbumin w/creat. ratio  3. Benign fibroma of prostate Enlarged prostate with minimal and occasional urinary symptoms. Continue on doxazosin and finasteride - doxazosin (CARDURA) 4 MG tablet; Take 1 tablet (4 mg total) by mouth at bedtime.  Dispense: 90 tablet; Refill: 0 - finasteride (PROSCAR) 5 MG tablet; Take 1 tablet (5 mg total) by mouth at bedtime.  Dispense: 90 tablet; Refill: 0 - PSA  4. Hypertriglyceridemia  - Lipid Profile - Comprehensive Metabolic Panel (CMET)    Kordell Jafri [facility]ad [facility] 10/13/2015 8:54 AM

## 2015-10-15 DIAGNOSIS — E119 Type 2 diabetes mellitus without complications: Secondary | ICD-10-CM | POA: Diagnosis not present

## 2015-10-15 DIAGNOSIS — Z794 Long term (current) use of insulin: Secondary | ICD-10-CM | POA: Diagnosis not present

## 2015-10-15 DIAGNOSIS — N400 Enlarged prostate without lower urinary tract symptoms: Secondary | ICD-10-CM | POA: Diagnosis not present

## 2015-10-15 DIAGNOSIS — E781 Pure hyperglyceridemia: Secondary | ICD-10-CM | POA: Diagnosis not present

## 2015-10-16 LAB — COMPREHENSIVE METABOLIC PANEL
ALBUMIN: 4 g/dL (ref 3.5–4.7)
ALK PHOS: 41 IU/L (ref 39–117)
ALT: 44 IU/L (ref 0–44)
AST: 33 IU/L (ref 0–40)
Albumin/Globulin Ratio: 1.8 (ref 1.1–2.5)
BILIRUBIN TOTAL: 0.5 mg/dL (ref 0.0–1.2)
BUN / CREAT RATIO: 16 (ref 10–22)
BUN: 15 mg/dL (ref 8–27)
CO2: 24 mmol/L (ref 18–29)
Calcium: 9.3 mg/dL (ref 8.6–10.2)
Chloride: 103 mmol/L (ref 96–106)
Creatinine, Ser: 0.93 mg/dL (ref 0.76–1.27)
GFR calc Af Amer: 87 mL/min/{1.73_m2} (ref >59)
GFR calc non Af Amer: 76 mL/min/{1.73_m2} (ref >59)
GLOBULIN, TOTAL: 2.2 g/dL (ref 1.5–4.5)
GLUCOSE: 103 mg/dL — AB (ref 65–99)
Potassium: 4.5 mmol/L (ref 3.5–5.2)
SODIUM: 140 mmol/L (ref 134–144)
Total Protein: 6.2 g/dL (ref 6.0–8.5)

## 2015-10-16 LAB — PSA: Prostate Specific Ag, Serum: 0.4 ng/mL (ref 0.0–4.0)

## 2015-10-16 LAB — LIPID PANEL
CHOLESTEROL TOTAL: 115 mg/dL (ref 100–199)
Chol/HDL Ratio: 2.8 ratio units (ref 0.0–5.0)
HDL: 41 mg/dL (ref >39)
LDL Calculated: 33 mg/dL (ref 0–99)
Triglycerides: 207 mg/dL — ABNORMAL HIGH (ref 0–149)
VLDL CHOLESTEROL CAL: 41 mg/dL — AB (ref 5–40)

## 2015-10-16 LAB — MICROALBUMIN / CREATININE URINE RATIO
Creatinine, Urine: 53.1 mg/dL
MICROALB/CREAT RATIO: 69.1 mg/g{creat} — AB (ref 0.0–30.0)
MICROALBUM., U, RANDOM: 36.7 ug/mL

## 2015-12-31 ENCOUNTER — Other Ambulatory Visit: Payer: Self-pay | Admitting: Family Medicine

## 2016-01-08 ENCOUNTER — Encounter: Payer: Self-pay | Admitting: Family Medicine

## 2016-01-08 ENCOUNTER — Ambulatory Visit (INDEPENDENT_AMBULATORY_CARE_PROVIDER_SITE_OTHER): Payer: Commercial Managed Care - HMO | Admitting: Family Medicine

## 2016-01-08 VITALS — BP 131/71 | HR 65 | Temp 97.7°F | Resp 15 | Ht 70.0 in | Wt 180.5 lb

## 2016-01-08 DIAGNOSIS — K219 Gastro-esophageal reflux disease without esophagitis: Secondary | ICD-10-CM | POA: Diagnosis not present

## 2016-01-08 DIAGNOSIS — M109 Gout, unspecified: Secondary | ICD-10-CM | POA: Diagnosis not present

## 2016-01-08 MED ORDER — RANITIDINE HCL 150 MG PO CAPS: 150.0000 mg | ORAL_CAPSULE | Freq: Every evening | ORAL | Status: DC

## 2016-01-08 MED ORDER — ALLOPURINOL 100 MG PO TABS: 100.0000 mg | ORAL_TABLET | Freq: Two times a day (BID) | ORAL | Status: DC

## 2016-01-08 NOTE — Progress Notes (Signed)
Name: Tracy [name]   MRN: [id number]    DOB: Jun 15, 1930   Date:01/08/2016       Progress Note  Subjective  Chief Complaint  Chief Complaint  Patient presents with  . Follow-up    3 mo  . Diabetes  . Medication Refill    ranitidine 150 mg / allopurinol 100 mg     Gastroesophageal Reflux He reports no abdominal pain, no belching, no chest pain, no heartburn or no nausea. This is a chronic problem. The problem has been gradually improving. The symptoms are aggravated by ETOH. He has tried a histamine-2 antagonist for the symptoms. The treatment provided significant (Takes Ranitidine sparingly, not every day.) relief.    Gout: Pt. Presents for follow up of Gout and medication refill. He is on Allopurinol 100 mg twice daily, last gout attack was many years ago, has been on Allopurinol since his last gout attack. Will obtain recent Uric Acid level.   Past Medical History  Diagnosis Date  . Diabetes mellitus without complication (HCC)   . Hypertension   . GERD (gastroesophageal reflux disease)   . Arthritis     Past Surgical History  Procedure Laterality Date  . Melanoma excision  07/2011  . Tonsillectomy    . Circumcision      Family History  Problem Relation Age of Onset  . Heart disease Mother   . Heart disease Brother     Social History   Social History  . Marital Status: Married    Spouse Name: N/A  . Number of Children: N/A  . Years of Education: N/A   Occupational History  . Not on file.   Social History Main Topics  . Smoking status: Former Smoker    Types: Cigarettes  . Smokeless tobacco: Never Used  . Alcohol Use: 0.0 oz/week    0 Standard drinks or equivalent per week     Comment: occasional  . Drug Use: No  . Sexual Activity: Not on file   Other Topics Concern  . Not on file   Social History Narrative     Current outpatient prescriptions:  .  ACCU-CHEK FASTCLIX LANCETS MISC, CHECK  BLOOD  SUGAR TWICE DAILY, Disp: 204 each, Rfl: 1 .   allopurinol (ZYLOPRIM) 100 MG tablet, Take 1 tablet by mouth 2 (two) times daily., Disp: , Rfl:  .  amLODipine (NORVASC) 5 MG tablet, TAKE 1 TABLET EVERY DAY, Disp: 90 tablet, Rfl: 0 .  aspirin 81 MG tablet, Take 81 mg by mouth., Disp: , Rfl:  .  atorvastatin (LIPITOR) 10 MG tablet, Take 5 mg by mouth daily., Disp: , Rfl:  .  Blood Glucose Monitoring Suppl (ACCU-CHEK NANO SMARTVIEW) W/DEVICE KIT, USE AS DIRECTED, Disp: 1 kit, Rfl: 0 .  dorzolamide (TRUSOPT) 2 % ophthalmic solution, , Disp: , Rfl:  .  doxazosin (CARDURA) 4 MG tablet, Take 1 tablet (4 mg total) by mouth at bedtime., Disp: 90 tablet, Rfl: 0 .  Ergocalciferol (VITAMIN D2) 2000 UNITS TABS, Take by mouth. Reported on 10/13/2015, Disp: , Rfl:  .  finasteride (PROSCAR) 5 MG tablet, Take 1 tablet (5 mg total) by mouth at bedtime., Disp: 90 tablet, Rfl: 0 .  glipiZIDE (GLUCOTROL) 10 MG tablet, Take 1 tablet by mouth 2 (two) times daily., Disp: , Rfl:  .  glucose blood (COOL BLOOD GLUCOSE TEST STRIPS) test strip, Use as instructed, Disp: 100 each, Rfl: 12 .  Insulin Glargine (LANTUS SOLOSTAR) 100 UNIT/ML Solostar Pen, Inject 44 Units into the  skin at bedtime., Disp: , Rfl:  .  Insulin Pen Needle (B-D ULTRAFINE III SHORT PEN) 31G X 8 MM MISC, , Disp: , Rfl:  .  lisinopril (PRINIVIL,ZESTRIL) 40 MG tablet, Take 1 tablet (40 mg total) by mouth daily., Disp: 90 tablet, Rfl: 1 .  metFORMIN (GLUCOPHAGE-XR) 750 MG 24 hr tablet, Take 1 tablet by mouth 2 (two) times daily., Disp: , Rfl:  .  Multiple Vitamin (DAILY VITAMINS) tablet, Take by mouth., Disp: , Rfl:  .  NEEDLE, DISP, 14 G 14G X 1" MISC, , Disp: , Rfl:  .  timolol (TIMOPTIC) 0.5 % ophthalmic solution, Frequency:QD   Dosage:0.0     Instructions:  Note:Dose: 0.5 %, OS, Disp: , Rfl:  .  TOLAK 4 % CREA, , Disp: , Rfl:   Allergies  Allergen Reactions  . Tetracyclines & Related Other (See Comments)     Review of Systems  Cardiovascular: Negative for chest pain.  Gastrointestinal: Negative  for heartburn, nausea and abdominal pain.  Musculoskeletal: Negative for joint pain.     Objective  Filed Vitals:   01/08/16 1016  BP: 131/71  Pulse: 65  Temp: 97.7 F (36.5 C)  TempSrc: Oral  Resp: 15  Height: 5\' 10"  (1.778 m)  Weight: 180 lb 8 oz (81.874 kg)  SpO2: 96%    Physical Exam  Constitutional: He is well-developed, well-nourished, and in no distress.  Cardiovascular: Normal rate and regular rhythm.   Pulmonary/Chest: Effort normal and breath sounds normal.  Abdominal: Soft. Bowel sounds are normal.  Musculoskeletal: He exhibits no edema.       Right foot: There is no tenderness and no swelling.       Left foot: There is no tenderness and no swelling.  Nursing note and vitals reviewed.    Assessment & Plan  1. Gout of right foot, unspecified cause, unspecified chronicity Stable, repeat uric acid level. - allopurinol (ZYLOPRIM) 100 MG tablet; Take 1 tablet (100 mg total) by mouth 2 (two) times daily.  Dispense: 180 tablet; Refill: 0 - Uric acid  2. Gastroesophageal reflux disease, esophagitis presence not specified He takes Zantac when needed, occasional symptoms. - ranitidine (ZANTAC) 150 MG capsule; Take 1 capsule (150 mg total) by mouth every evening.  Dispense: 90 capsule; Refill: 0   Caralyn Twining [facility] 01/08/2016 10:40 AM

## 2016-01-11 DIAGNOSIS — M109 Gout, unspecified: Secondary | ICD-10-CM | POA: Diagnosis not present

## 2016-01-12 LAB — URIC ACID: Uric Acid: 4.9 mg/dL (ref 3.7–8.6)

## 2016-01-21 ENCOUNTER — Ambulatory Visit (INDEPENDENT_AMBULATORY_CARE_PROVIDER_SITE_OTHER): Payer: Commercial Managed Care - HMO | Admitting: Family Medicine

## 2016-01-21 ENCOUNTER — Encounter: Payer: Self-pay | Admitting: Family Medicine

## 2016-01-21 VITALS — BP 132/70 | HR 67 | Temp 97.9°F | Resp 15 | Ht 70.0 in | Wt 181.3 lb

## 2016-01-21 DIAGNOSIS — E785 Hyperlipidemia, unspecified: Secondary | ICD-10-CM

## 2016-01-21 DIAGNOSIS — Z794 Long term (current) use of insulin: Secondary | ICD-10-CM

## 2016-01-21 DIAGNOSIS — E119 Type 2 diabetes mellitus without complications: Secondary | ICD-10-CM

## 2016-01-21 DIAGNOSIS — H401132 Primary open-angle glaucoma, bilateral, moderate stage: Secondary | ICD-10-CM | POA: Diagnosis not present

## 2016-01-21 LAB — POCT GLYCOSYLATED HEMOGLOBIN (HGB A1C): Hemoglobin A1C: 7

## 2016-01-21 LAB — GLUCOSE, POCT (MANUAL RESULT ENTRY): POC GLUCOSE: 133 mg/dL — AB (ref 70–99)

## 2016-01-21 MED ORDER — GLIPIZIDE 10 MG PO TABS: 10.0000 mg | ORAL_TABLET | Freq: Two times a day (BID) | ORAL | Status: DC

## 2016-01-21 NOTE — Progress Notes (Signed)
Name: Tracy [name]   MRN: [id number]    DOB: 1929/12/02   Date:01/21/2016       Progress Note  Subjective  Chief Complaint  Chief Complaint  Patient presents with  . Follow-up    2 wk  . Diabetes    Diabetes He presents for his follow-up diabetic visit. He has type 2 diabetes mellitus. His disease course has been stable. Pertinent negatives for diabetes include no fatigue, no polydipsia and no polyuria. Symptoms are stable. Current diabetic treatment includes intensive insulin program and oral agent (dual therapy). His breakfast blood glucose range is generally 110-130 mg/dl.  Hyperlipidemia This is a chronic problem. The problem is controlled. Recent lipid tests were reviewed and are high (Triglycerides elevated.). Exacerbating diseases include diabetes. Pertinent negatives include no leg pain, myalgias or shortness of breath. Current antihyperlipidemic treatment includes statins.     Past Medical History  Diagnosis Date  . Diabetes mellitus without complication (HCC)   . Hypertension   . GERD (gastroesophageal reflux disease)   . Arthritis     Past Surgical History  Procedure Laterality Date  . Melanoma excision  07/2011  . Tonsillectomy    . Circumcision      Family History  Problem Relation Age of Onset  . Heart disease Mother   . Heart disease Brother     Social History   Social History  . Marital Status: Married    Spouse Name: N/A  . Number of Children: N/A  . Years of Education: N/A   Occupational History  . Not on file.   Social History Main Topics  . Smoking status: Former Smoker    Types: Cigarettes  . Smokeless tobacco: Never Used  . Alcohol Use: 0.0 oz/week    0 Standard drinks or equivalent per week     Comment: occasional  . Drug Use: No  . Sexual Activity: Not on file   Other Topics Concern  . Not on file   Social History Narrative     Current outpatient prescriptions:  .  ACCU-CHEK FASTCLIX LANCETS MISC, CHECK  BLOOD  SUGAR  TWICE DAILY, Disp: 204 each, Rfl: 1 .  allopurinol (ZYLOPRIM) 100 MG tablet, Take 1 tablet (100 mg total) by mouth 2 (two) times daily., Disp: 180 tablet, Rfl: 0 .  amLODipine (NORVASC) 5 MG tablet, TAKE 1 TABLET EVERY DAY, Disp: 90 tablet, Rfl: 0 .  aspirin 81 MG tablet, Take 81 mg by mouth., Disp: , Rfl:  .  atorvastatin (LIPITOR) 10 MG tablet, Take 5 mg by mouth daily., Disp: , Rfl:  .  Blood Glucose Monitoring Suppl (ACCU-CHEK NANO SMARTVIEW) W/DEVICE KIT, USE AS DIRECTED, Disp: 1 kit, Rfl: 0 .  dorzolamide (TRUSOPT) 2 % ophthalmic solution, , Disp: , Rfl:  .  doxazosin (CARDURA) 4 MG tablet, Take 1 tablet (4 mg total) by mouth at bedtime., Disp: 90 tablet, Rfl: 0 .  Ergocalciferol (VITAMIN D2) 2000 UNITS TABS, Take by mouth. Reported on 10/13/2015, Disp: , Rfl:  .  finasteride (PROSCAR) 5 MG tablet, Take 1 tablet (5 mg total) by mouth at bedtime., Disp: 90 tablet, Rfl: 0 .  glipiZIDE (GLUCOTROL) 10 MG tablet, Take 1 tablet by mouth 2 (two) times daily., Disp: , Rfl:  .  glucose blood (COOL BLOOD GLUCOSE TEST STRIPS) test strip, Use as instructed, Disp: 100 each, Rfl: 12 .  Insulin Glargine (LANTUS SOLOSTAR) 100 UNIT/ML Solostar Pen, Inject 44 Units into the skin at bedtime., Disp: , Rfl:  .  Insulin  Pen Needle (B-D ULTRAFINE III SHORT PEN) 31G X 8 MM MISC, , Disp: , Rfl:  .  lisinopril (PRINIVIL,ZESTRIL) 40 MG tablet, Take 1 tablet (40 mg total) by mouth daily., Disp: 90 tablet, Rfl: 1 .  metFORMIN (GLUCOPHAGE-XR) 750 MG 24 hr tablet, Take 1 tablet by mouth 2 (two) times daily., Disp: , Rfl:  .  Multiple Vitamin (DAILY VITAMINS) tablet, Take by mouth., Disp: , Rfl:  .  NEEDLE, DISP, 14 G 14G X 1" MISC, , Disp: , Rfl:  .  ranitidine (ZANTAC) 150 MG capsule, Take 1 capsule (150 mg total) by mouth every evening., Disp: 90 capsule, Rfl: 0 .  timolol (TIMOPTIC) 0.5 % ophthalmic solution, Frequency:QD   Dosage:0.0     Instructions:  Note:Dose: 0.5 %, OS, Disp: , Rfl:  .  TOLAK 4 % CREA, , Disp: ,  Rfl:   Allergies  Allergen Reactions  . Tetracyclines & Related Other (See Comments)     Review of Systems  Constitutional: Negative for malaise/fatigue and fatigue.  Respiratory: Negative for shortness of breath.   Gastrointestinal: Negative for nausea, vomiting, abdominal pain and diarrhea.  Musculoskeletal: Negative for myalgias.  Endo/Heme/Allergies: Negative for polydipsia.    Objective  Filed Vitals:   01/21/16 0957  BP: 132/70  Pulse: 67  Temp: 97.9 F (36.6 C)  TempSrc: Oral  Resp: 15  Height: 5\' 10"  (1.778 m)  Weight: 181 lb 4.8 oz (82.237 kg)  SpO2: 96%    Physical Exam  Constitutional: He is oriented to person, place, and time and well-developed, well-nourished, and in no distress.  HENT:  Head: Normocephalic and atraumatic.  Cardiovascular: Normal rate and regular rhythm.   Pulmonary/Chest: Effort normal and breath sounds normal.  Neurological: He is alert and oriented to person, place, and time.  Nursing note and vitals reviewed.   Assessment & Plan  1. Type 2 diabetes mellitus treated with insulin (HCC) Foot exam completed, A1c i at goal at 7.0%, continue on present diabetic regimen.s - POCT HgB A1C - POCT Glucose (CBG) - glipiZIDE (GLUCOTROL) 10 MG tablet; Take 1 tablet (10 mg total) by mouth 2 (two) times daily before a meal.  Dispense: 180 tablet; Refill: 0  2. Dyslipidemia Elevated LDL, repeat and consider restarting on pure EPA. - Lipid Profile - Comprehensive Metabolic Panel (CMET)   Bethel Sirois [facility] 01/21/2016 10:05 AM

## 2016-01-22 DIAGNOSIS — E785 Hyperlipidemia, unspecified: Secondary | ICD-10-CM | POA: Diagnosis not present

## 2016-01-23 LAB — LIPID PANEL
CHOLESTEROL TOTAL: 103 mg/dL (ref 100–199)
Chol/HDL Ratio: 2.7 ratio units (ref 0.0–5.0)
HDL: 38 mg/dL — AB (ref >39)
LDL Calculated: 3 mg/dL (ref 0–99)
TRIGLYCERIDES: 309 mg/dL — AB (ref 0–149)
VLDL Cholesterol Cal: 62 mg/dL — ABNORMAL HIGH (ref 5–40)

## 2016-01-23 LAB — COMPREHENSIVE METABOLIC PANEL
A/G RATIO: 1.8 (ref 1.2–2.2)
ALBUMIN: 4.2 g/dL (ref 3.5–4.7)
ALT: 43 IU/L (ref 0–44)
AST: 28 IU/L (ref 0–40)
Alkaline Phosphatase: 44 IU/L (ref 39–117)
BILIRUBIN TOTAL: 0.3 mg/dL (ref 0.0–1.2)
BUN / CREAT RATIO: 23 (ref 10–24)
BUN: 22 mg/dL (ref 8–27)
CO2: 22 mmol/L (ref 18–29)
Calcium: 9.1 mg/dL (ref 8.6–10.2)
Chloride: 101 mmol/L (ref 96–106)
Creatinine, Ser: 0.97 mg/dL (ref 0.76–1.27)
GFR calc non Af Amer: 72 mL/min/{1.73_m2} (ref >59)
GFR, EST AFRICAN AMERICAN: 83 mL/min/{1.73_m2} (ref >59)
GLOBULIN, TOTAL: 2.4 g/dL (ref 1.5–4.5)
Glucose: 135 mg/dL — ABNORMAL HIGH (ref 65–99)
POTASSIUM: 4.6 mmol/L (ref 3.5–5.2)
SODIUM: 139 mmol/L (ref 134–144)
TOTAL PROTEIN: 6.6 g/dL (ref 6.0–8.5)

## 2016-01-26 DIAGNOSIS — H401132 Primary open-angle glaucoma, bilateral, moderate stage: Secondary | ICD-10-CM | POA: Diagnosis not present

## 2016-02-01 ENCOUNTER — Telehealth: Payer: Self-pay | Admitting: Family Medicine

## 2016-02-11 NOTE — Telephone Encounter (Signed)
ERRENOUS °

## 2016-02-22 ENCOUNTER — Other Ambulatory Visit: Payer: Self-pay | Admitting: Family Medicine

## 2016-03-31 DIAGNOSIS — Z85820 Personal history of malignant melanoma of skin: Secondary | ICD-10-CM | POA: Diagnosis not present

## 2016-03-31 DIAGNOSIS — X32XXXA Exposure to sunlight, initial encounter: Secondary | ICD-10-CM | POA: Diagnosis not present

## 2016-03-31 DIAGNOSIS — L820 Inflamed seborrheic keratosis: Secondary | ICD-10-CM | POA: Diagnosis not present

## 2016-03-31 DIAGNOSIS — D485 Neoplasm of uncertain behavior of skin: Secondary | ICD-10-CM | POA: Diagnosis not present

## 2016-03-31 DIAGNOSIS — Z85828 Personal history of other malignant neoplasm of skin: Secondary | ICD-10-CM | POA: Diagnosis not present

## 2016-03-31 DIAGNOSIS — L570 Actinic keratosis: Secondary | ICD-10-CM | POA: Diagnosis not present

## 2016-04-20 ENCOUNTER — Other Ambulatory Visit: Payer: Self-pay | Admitting: Family Medicine

## 2016-04-20 DIAGNOSIS — M109 Gout, unspecified: Secondary | ICD-10-CM

## 2016-04-20 DIAGNOSIS — E119 Type 2 diabetes mellitus without complications: Secondary | ICD-10-CM

## 2016-04-20 DIAGNOSIS — I10 Essential (primary) hypertension: Secondary | ICD-10-CM

## 2016-04-20 DIAGNOSIS — Z794 Long term (current) use of insulin: Secondary | ICD-10-CM

## 2016-04-26 ENCOUNTER — Ambulatory Visit: Payer: Commercial Managed Care - HMO | Admitting: Family Medicine

## 2016-04-27 ENCOUNTER — Encounter: Payer: Self-pay | Admitting: Family Medicine

## 2016-04-27 ENCOUNTER — Ambulatory Visit (INDEPENDENT_AMBULATORY_CARE_PROVIDER_SITE_OTHER): Payer: Commercial Managed Care - HMO | Admitting: Family Medicine

## 2016-04-27 VITALS — BP 110/70 | HR 92 | Temp 98.4°F | Resp 16 | Ht 70.0 in | Wt 177.2 lb

## 2016-04-27 DIAGNOSIS — E1121 Type 2 diabetes mellitus with diabetic nephropathy: Secondary | ICD-10-CM | POA: Diagnosis not present

## 2016-04-27 DIAGNOSIS — E785 Hyperlipidemia, unspecified: Secondary | ICD-10-CM | POA: Diagnosis not present

## 2016-04-27 DIAGNOSIS — Z794 Long term (current) use of insulin: Secondary | ICD-10-CM

## 2016-04-27 DIAGNOSIS — E781 Pure hyperglyceridemia: Secondary | ICD-10-CM

## 2016-04-27 DIAGNOSIS — I10 Essential (primary) hypertension: Secondary | ICD-10-CM

## 2016-04-27 DIAGNOSIS — N400 Enlarged prostate without lower urinary tract symptoms: Secondary | ICD-10-CM

## 2016-04-27 LAB — POCT GLYCOSYLATED HEMOGLOBIN (HGB A1C): Hemoglobin A1C: 7.5

## 2016-04-27 LAB — GLUCOSE, POCT (MANUAL RESULT ENTRY): POC Glucose: 198 mg/dl — AB (ref 70–99)

## 2016-04-27 MED ORDER — METFORMIN HCL 1000 MG PO TABS: 1000.0000 mg | ORAL_TABLET | Freq: Two times a day (BID) | ORAL | 1 refills | Status: DC

## 2016-04-27 MED ORDER — FINASTERIDE 5 MG PO TABS: 5.0000 mg | ORAL_TABLET | Freq: Every day | ORAL | 0 refills | Status: DC

## 2016-04-27 MED ORDER — ATORVASTATIN CALCIUM 10 MG PO TABS: 10.0000 mg | ORAL_TABLET | Freq: Every day | ORAL | 0 refills | Status: DC

## 2016-04-27 NOTE — Progress Notes (Signed)
Name: Tracy [name]   MRN: [id number]    DOB: [birth year]   Date:04/27/2016       Progress Note  Subjective  Chief Complaint  Chief Complaint  Patient presents with  . Diabetes    follow up 3 months  . Hypertension    Diabetes  He presents for his follow-up diabetic visit. He has type 2 diabetes mellitus. His disease course has been worsening. Pertinent negatives for hypoglycemia include no headaches. Pertinent negatives for diabetes include no blurred vision, no chest pain, no fatigue, no polydipsia and no polyuria. Symptoms are stable. Pertinent negatives for diabetic complications include no CVA. Current diabetic treatment includes intensive insulin program and oral agent (dual therapy). He is following a diabetic diet. His breakfast blood glucose range is generally 140-180 mg/dl. An ACE inhibitor/angiotensin II receptor blocker is being taken.  Hypertension  This is a chronic problem. The problem is unchanged. Pertinent negatives include no blurred vision, chest pain, headaches, palpitations or shortness of breath. Past treatments include calcium channel blockers and ACE inhibitors. Hypertensive end-organ damage includes CAD/MI. There is no history of kidney disease or CVA.  Hyperlipidemia  This is a chronic problem. The problem is uncontrolled. Recent lipid tests were reviewed and are high. Pertinent negatives include no chest pain, myalgias or shortness of breath. Current antihyperlipidemic treatment includes statins. Risk factors for coronary artery disease include diabetes mellitus, dyslipidemia and male sex.     Past Medical History:  Diagnosis Date  . Arthritis   . Diabetes mellitus without complication (HCC)   . GERD (gastroesophageal reflux disease)   . Hypertension     Past Surgical History:  Procedure Laterality Date  . CIRCUMCISION    . MELANOMA EXCISION  07/2011  . TONSILLECTOMY      Family History  Problem Relation Age of Onset  . Heart disease Mother   .  Heart disease Brother     Social History   Social History  . Marital status: Married    Spouse name: N/A  . Number of children: N/A  . Years of education: N/A   Occupational History  . Not on file.   Social History Main Topics  . Smoking status: Former Smoker    Types: Cigarettes  . Smokeless tobacco: Never Used  . Alcohol use 0.0 oz/week     Comment: occasional  . Drug use: No  . Sexual activity: Not on file   Other Topics Concern  . Not on file   Social History Narrative  . No narrative on file     Current Outpatient Prescriptions:  .  ACCU-CHEK FASTCLIX LANCETS MISC, CHECK  BLOOD  SUGAR TWICE DAILY, Disp: 204 each, Rfl: 1 .  allopurinol (ZYLOPRIM) 100 MG tablet, TAKE 1 TABLET TWICE DAILY, Disp: 180 tablet, Rfl: 0 .  amLODipine (NORVASC) 5 MG tablet, TAKE 1 TABLET EVERY DAY, Disp: 90 tablet, Rfl: 0 .  aspirin 81 MG tablet, Take 81 mg by mouth., Disp: , Rfl:  .  atorvastatin (LIPITOR) 10 MG tablet, Take 5 mg by mouth daily., Disp: , Rfl:  .  Blood Glucose Monitoring Suppl (ACCU-CHEK NANO SMARTVIEW) W/DEVICE KIT, USE AS DIRECTED, Disp: 1 kit, Rfl: 0 .  dorzolamide (TRUSOPT) 2 % ophthalmic solution, , Disp: , Rfl:  .  doxazosin (CARDURA) 4 MG tablet, TAKE 1 TABLET AT BEDTIME, Disp: 90 tablet, Rfl: 0 .  Ergocalciferol (VITAMIN D2) 2000 UNITS TABS, Take by mouth. Reported on 10/13/2015, Disp: , Rfl:  .  finasteride (PROSCAR) 5 MG  tablet, TAKE 1 TABLET AT BEDTIME, Disp: 90 tablet, Rfl: 0 .  glipiZIDE (GLUCOTROL) 10 MG tablet, TAKE 1 TABLET TWICE DAILY BEFORE MEALS, Disp: 180 tablet, Rfl: 0 .  glucose blood (COOL BLOOD GLUCOSE TEST STRIPS) test strip, Use as instructed, Disp: 100 each, Rfl: 12 .  Insulin Glargine (LANTUS SOLOSTAR) 100 UNIT/ML Solostar Pen, Inject 44 Units into the skin at bedtime., Disp: , Rfl:  .  Insulin Pen Needle (B-D ULTRAFINE III SHORT PEN) 31G X 8 MM MISC, , Disp: , Rfl:  .  lisinopril (PRINIVIL,ZESTRIL) 40 MG tablet, TAKE 1 TABLET EVERY DAY, Disp: 90  tablet, Rfl: 3 .  metFORMIN (GLUCOPHAGE-XR) 750 MG 24 hr tablet, Take 1 tablet by mouth 2 (two) times daily., Disp: , Rfl:  .  Multiple Vitamin (DAILY VITAMINS) tablet, Take by mouth., Disp: , Rfl:  .  NEEDLE, DISP, 14 G 14G X 1" MISC, , Disp: , Rfl:  .  ranitidine (ZANTAC) 150 MG capsule, Take 1 capsule (150 mg total) by mouth every evening., Disp: 90 capsule, Rfl: 0 .  timolol (TIMOPTIC) 0.5 % ophthalmic solution, Frequency:QD   Dosage:0.0     Instructions:  Note:Dose: 0.5 %, OS, Disp: , Rfl:  .  TOLAK 4 % CREA, , Disp: , Rfl:   Allergies  Allergen Reactions  . Tetracyclines & Related Other (See Comments)    Review of Systems  Constitutional: Negative for fatigue.  Eyes: Negative for blurred vision.  Respiratory: Negative for shortness of breath.   Cardiovascular: Negative for chest pain and palpitations.  Musculoskeletal: Negative for myalgias.  Neurological: Negative for headaches.  Endo/Heme/Allergies: Negative for polydipsia.     Objective  Vitals:   04/27/16 0822  BP: 110/70  Pulse: 92  Resp: 16  Temp: 98.4 F (36.9 C)  TempSrc: Oral  SpO2: 97%  Weight: 177 lb 3.2 oz (80.4 kg)  Height: 5\' 10"  (1.778 m)    Physical Exam  Constitutional: He is oriented to person, place, and time and well-developed, well-nourished, and in no distress.  HENT:  Head: Normocephalic and atraumatic.  Cardiovascular: Normal rate, regular rhythm and normal heart sounds.   No murmur heard. Pulmonary/Chest: Effort normal and breath sounds normal. No respiratory distress. He has no wheezes. He has no rhonchi.  Abdominal: Soft. Bowel sounds are normal. There is no tenderness.  Musculoskeletal:       Right ankle: He exhibits no swelling.       Left ankle: He exhibits no swelling.  Neurological: He is alert and oriented to person, place, and time.  Psychiatric: Mood, memory, affect and judgment normal.  Nursing note and vitals reviewed.   Recent Results (from the past 2160 hour(s))   POCT Glucose (CBG)     Status: Abnormal   Collection Time: 04/27/16  8:26 AM  Result Value Ref Range   POC Glucose 198 (A) 70 - 99 mg/dl  POCT HgB W0J     Status: None   Collection Time: 04/27/16  8:28 AM  Result Value Ref Range   Hemoglobin A1C 7.5      Assessment & Plan  1. Type 2 diabetes mellitus with diabetic nephropathy, with long-term current use of insulin (HCC) A1c is worsening, fasting a.m. readings are also in the high 150s. Increase metformin from 750 mg to 1000 mg twice a day. Advised on dietary modification and compliance. - POCT HgB A1C - POCT Glucose (CBG) - metFORMIN (GLUCOPHAGE) 1000 MG tablet; Take 1 tablet (1,000 mg total) by mouth 2 (two) times  daily with a meal.  Dispense: 180 tablet; Refill: 1  2. Essential hypertension BP stable and controlled on present antihypertensive therapy  3. Hypertriglyceridemia Recheck FLP, if persistently above 300, consider starting back on Vascepa - Lipid Profile  4. Dyslipidemia Continue on statin therapy, goal LDL is less than 70 - COMPLETE METABOLIC PANEL WITH GFR - atorvastatin (LIPITOR) 10 MG tablet; Take 1 tablet (10 mg total) by mouth daily at 6 PM.  Dispense: 90 tablet; Refill: 0  5. Benign fibroma of prostate Stable, no nocturia - finasteride (PROSCAR) 5 MG tablet; Take 1 tablet (5 mg total) by mouth at bedtime.  Dispense: 90 tablet; Refill: 0    Brazos Sandoval [facility] 04/27/2016 8:32 AM

## 2016-04-29 DIAGNOSIS — E785 Hyperlipidemia, unspecified: Secondary | ICD-10-CM | POA: Diagnosis not present

## 2016-04-29 DIAGNOSIS — E781 Pure hyperglyceridemia: Secondary | ICD-10-CM | POA: Diagnosis not present

## 2016-04-29 LAB — COMPLETE METABOLIC PANEL WITH GFR
ALT: 37 U/L (ref 9–46)
AST: 23 U/L (ref 10–35)
Albumin: 3.8 g/dL (ref 3.6–5.1)
Alkaline Phosphatase: 44 U/L (ref 40–115)
BUN: 13 mg/dL (ref 7–25)
CHLORIDE: 104 mmol/L (ref 98–110)
CO2: 24 mmol/L (ref 20–31)
CREATININE: 0.85 mg/dL (ref 0.70–1.11)
Calcium: 8.8 mg/dL (ref 8.6–10.3)
GFR, Est Non African American: 81 mL/min (ref >=60)
GLUCOSE: 152 mg/dL — AB (ref 65–99)
Potassium: 4.4 mmol/L (ref 3.5–5.3)
SODIUM: 138 mmol/L (ref 135–146)
Total Bilirubin: 0.4 mg/dL (ref 0.2–1.2)
Total Protein: 6 g/dL — ABNORMAL LOW (ref 6.1–8.1)

## 2016-04-29 LAB — LIPID PANEL
Cholesterol: 107 mg/dL — ABNORMAL LOW (ref 125–200)
HDL: 33 mg/dL — ABNORMAL LOW (ref >=40)
TRIGLYCERIDES: 487 mg/dL — AB (ref <150)
Total CHOL/HDL Ratio: 3.2 Ratio (ref <=5.0)

## 2016-06-18 ENCOUNTER — Other Ambulatory Visit: Payer: Self-pay | Admitting: Family Medicine

## 2016-07-15 ENCOUNTER — Other Ambulatory Visit: Payer: Self-pay | Admitting: Family Medicine

## 2016-07-22 ENCOUNTER — Other Ambulatory Visit: Payer: Self-pay | Admitting: Family Medicine

## 2016-07-22 DIAGNOSIS — Z794 Long term (current) use of insulin: Principal | ICD-10-CM

## 2016-07-22 DIAGNOSIS — E785 Hyperlipidemia, unspecified: Secondary | ICD-10-CM

## 2016-07-22 DIAGNOSIS — E119 Type 2 diabetes mellitus without complications: Secondary | ICD-10-CM

## 2016-07-28 ENCOUNTER — Encounter: Payer: Self-pay | Admitting: Family Medicine

## 2016-07-28 ENCOUNTER — Ambulatory Visit
Admission: RE | Admit: 2016-07-28 | Discharge: 2016-07-28 | Disposition: A | Discharge status: Home / Self Care | Payer: Commercial Managed Care - HMO | Source: Ambulatory Visit | Attending: Family Medicine | Admitting: Family Medicine

## 2016-07-28 ENCOUNTER — Ambulatory Visit (INDEPENDENT_AMBULATORY_CARE_PROVIDER_SITE_OTHER): Payer: Commercial Managed Care - HMO | Admitting: Family Medicine

## 2016-07-28 VITALS — BP 124/68 | HR 68 | Temp 98.0°F | Resp 16 | Ht 70.0 in | Wt 181.6 lb

## 2016-07-28 DIAGNOSIS — Z23 Encounter for immunization: Secondary | ICD-10-CM

## 2016-07-28 DIAGNOSIS — M25552 Pain in left hip: Secondary | ICD-10-CM

## 2016-07-28 MED ORDER — MELOXICAM 7.5 MG PO TABS: 7.5000 mg | ORAL_TABLET | Freq: Every day | ORAL | 0 refills | Status: DC

## 2016-07-28 NOTE — Progress Notes (Signed)
Name: Tracy [name]   MRN: [id number]    DOB: 1929-10-08   Date:07/28/2016       Progress Note  Subjective  Chief Complaint  Chief Complaint  Patient presents with  . Hip Pain    left hip pain for weeks    Hip Pain   There was no injury mechanism. The pain is present in the left hip. The quality of the pain is described as aching. The pain is at a severity of 4/10. The pain is moderate. The pain has been fluctuating since onset. Pertinent negatives include no inability to bear weight. The symptoms are aggravated by movement (siting in one position makes it worse, usually more in AM, gets better throughout the day). He has tried nothing for the symptoms.     Past Medical History:  Diagnosis Date  . Arthritis   . Diabetes mellitus without complication (HCC)   . GERD (gastroesophageal reflux disease)   . Hypertension     Past Surgical History:  Procedure Laterality Date  . CIRCUMCISION    . MELANOMA EXCISION  07/2011  . TONSILLECTOMY      Family History  Problem Relation Age of Onset  . Heart disease Mother   . Heart disease Brother     Social History   Social History  . Marital status: Married    Spouse name: N/A  . Number of children: N/A  . Years of education: N/A   Occupational History  . Not on file.   Social History Main Topics  . Smoking status: Former Smoker    Types: Cigarettes  . Smokeless tobacco: Never Used  . Alcohol use 0.0 oz/week     Comment: occasional  . Drug use: No  . Sexual activity: Not on file   Other Topics Concern  . Not on file   Social History Narrative  . No narrative on file     Current Outpatient Prescriptions:  .  ACCU-CHEK FASTCLIX LANCETS MISC, CHECK  BLOOD  SUGAR TWICE DAILY, Disp: 204 each, Rfl: 1 .  allopurinol (ZYLOPRIM) 100 MG tablet, TAKE 1 TABLET TWICE DAILY, Disp: 180 tablet, Rfl: 0 .  amLODipine (NORVASC) 5 MG tablet, TAKE 1 TABLET EVERY DAY, Disp: 90 tablet, Rfl: 0 .  aspirin 81 MG tablet, Take 81 mg by  mouth., Disp: , Rfl:  .  atorvastatin (LIPITOR) 10 MG tablet, TAKE 1 TABLET DAILY AT 6 PM., Disp: 90 tablet, Rfl: 0 .  Blood Glucose Monitoring Suppl (ACCU-CHEK NANO SMARTVIEW) W/DEVICE KIT, USE AS DIRECTED, Disp: 1 kit, Rfl: 0 .  dorzolamide (TRUSOPT) 2 % ophthalmic solution, , Disp: , Rfl:  .  doxazosin (CARDURA) 4 MG tablet, TAKE 1 TABLET AT BEDTIME, Disp: 90 tablet, Rfl: 0 .  Ergocalciferol (VITAMIN D2) 2000 UNITS TABS, Take by mouth. Reported on 10/13/2015, Disp: , Rfl:  .  finasteride (PROSCAR) 5 MG tablet, Take 1 tablet (5 mg total) by mouth at bedtime., Disp: 90 tablet, Rfl: 0 .  glipiZIDE (GLUCOTROL) 10 MG tablet, TAKE 1 TABLET TWICE DAILY BEFORE MEALS, Disp: 180 tablet, Rfl: 0 .  glucose blood (COOL BLOOD GLUCOSE TEST STRIPS) test strip, Use as instructed, Disp: 100 each, Rfl: 12 .  Insulin Glargine (LANTUS SOLOSTAR) 100 UNIT/ML Solostar Pen, Inject 44 Units into the skin at bedtime., Disp: , Rfl:  .  Insulin Pen Needle (B-D ULTRAFINE III SHORT PEN) 31G X 8 MM MISC, , Disp: , Rfl:  .  lisinopril (PRINIVIL,ZESTRIL) 40 MG tablet, TAKE 1 TABLET EVERY DAY,  Disp: 90 tablet, Rfl: 3 .  metFORMIN (GLUCOPHAGE) 1000 MG tablet, Take 1 tablet (1,000 mg total) by mouth 2 (two) times daily with a meal., Disp: 180 tablet, Rfl: 1 .  metFORMIN (GLUCOPHAGE-XR) 750 MG 24 hr tablet, Take 1 tablet by mouth 2 (two) times daily., Disp: , Rfl:  .  Multiple Vitamin (DAILY VITAMINS) tablet, Take by mouth., Disp: , Rfl:  .  NEEDLE, DISP, 14 G 14G X 1" MISC, , Disp: , Rfl:  .  ranitidine (ZANTAC) 150 MG capsule, Take 1 capsule (150 mg total) by mouth every evening., Disp: 90 capsule, Rfl: 0 .  timolol (TIMOPTIC) 0.5 % ophthalmic solution, Frequency:QD   Dosage:0.0     Instructions:  Note:Dose: 0.5 %, OS, Disp: , Rfl:  .  TOLAK 4 % CREA, , Disp: , Rfl:   Allergies  Allergen Reactions  . Tetracyclines & Related Other (See Comments)     Review of Systems  Constitutional: Negative for chills, fever and  malaise/fatigue.  Musculoskeletal: Positive for joint pain.      Objective  Vitals:   07/28/16 0918  BP: 124/68  Pulse: 68  Resp: 16  Temp: 98 F (36.7 C)  TempSrc: Oral  SpO2: 96%  Weight: 181 lb 9.6 oz (82.4 kg)  Height: 5\' 10"  (1.778 m)    Physical Exam  Constitutional: He is oriented to person, place, and time and well-developed, well-nourished, and in no distress.  Musculoskeletal:       Left hip: He exhibits tenderness and swelling.       Legs: Neurological: He is alert and oriented to person, place, and time.  Psychiatric: Mood, memory, affect and judgment normal.  Nursing note and vitals reviewed.    Assessment & Plan  1. Left hip pain Suspect osteoarthritis, we'll obtain x-rays, start on meloxicam 7.5 mg daily after meals - meloxicam (MOBIC) 7.5 MG tablet; Take 1 tablet (7.5 mg total) by mouth daily.  Dispense: 30 tablet; Refill: 0 - DG HIP UNILAT WITH PELVIS 2-3 VIEWS LEFT; Future  2. Need for influenza vaccination  - Flu vaccine HIGH DOSE PF (Fluzone High dose)   [name] [facility] 07/28/2016 9:25 AM

## 2016-08-11 DIAGNOSIS — Z79899 Other long term (current) drug therapy: Secondary | ICD-10-CM | POA: Diagnosis not present

## 2016-08-11 DIAGNOSIS — Z794 Long term (current) use of insulin: Secondary | ICD-10-CM | POA: Diagnosis not present

## 2016-08-11 DIAGNOSIS — E119 Type 2 diabetes mellitus without complications: Secondary | ICD-10-CM | POA: Diagnosis not present

## 2016-08-11 DIAGNOSIS — Z7982 Long term (current) use of aspirin: Secondary | ICD-10-CM | POA: Diagnosis not present

## 2016-08-11 DIAGNOSIS — E118 Type 2 diabetes mellitus with unspecified complications: Secondary | ICD-10-CM | POA: Diagnosis not present

## 2016-08-11 DIAGNOSIS — I10 Essential (primary) hypertension: Secondary | ICD-10-CM | POA: Diagnosis not present

## 2016-08-11 DIAGNOSIS — E781 Pure hyperglyceridemia: Secondary | ICD-10-CM | POA: Diagnosis not present

## 2016-08-12 ENCOUNTER — Other Ambulatory Visit: Payer: Self-pay | Admitting: Family Medicine

## 2016-08-12 DIAGNOSIS — M109 Gout, unspecified: Secondary | ICD-10-CM

## 2016-08-25 ENCOUNTER — Ambulatory Visit (INDEPENDENT_AMBULATORY_CARE_PROVIDER_SITE_OTHER): Payer: Commercial Managed Care - HMO | Admitting: Family Medicine

## 2016-08-25 ENCOUNTER — Encounter: Payer: Self-pay | Admitting: Family Medicine

## 2016-08-25 VITALS — BP 120/78 | HR 84 | Temp 98.1°F | Resp 16 | Ht 70.0 in | Wt 183.3 lb

## 2016-08-25 DIAGNOSIS — Z794 Long term (current) use of insulin: Secondary | ICD-10-CM | POA: Diagnosis not present

## 2016-08-25 DIAGNOSIS — E1121 Type 2 diabetes mellitus with diabetic nephropathy: Secondary | ICD-10-CM | POA: Diagnosis not present

## 2016-08-25 DIAGNOSIS — I10 Essential (primary) hypertension: Secondary | ICD-10-CM

## 2016-08-25 DIAGNOSIS — E785 Hyperlipidemia, unspecified: Secondary | ICD-10-CM

## 2016-08-25 DIAGNOSIS — E781 Pure hyperglyceridemia: Secondary | ICD-10-CM

## 2016-08-25 LAB — GLUCOSE, POCT (MANUAL RESULT ENTRY): POC GLUCOSE: 221 mg/dL — AB (ref 70–99)

## 2016-08-25 LAB — POCT GLYCOSYLATED HEMOGLOBIN (HGB A1C): HEMOGLOBIN A1C: 7.1

## 2016-08-25 MED ORDER — ATORVASTATIN CALCIUM 10 MG PO TABS
ORAL_TABLET | ORAL | 0 refills | Status: DC
Start: 2016-08-25 — End: 2016-12-08

## 2016-08-25 MED ORDER — METFORMIN HCL 1000 MG PO TABS: 1000.0000 mg | ORAL_TABLET | Freq: Two times a day (BID) | ORAL | 1 refills | Status: DC

## 2016-08-25 NOTE — Progress Notes (Signed)
Name: Tracy [name]   MRN: [id number]    DOB: 1930-08-21   Date:08/25/2016       Progress Note  Subjective  Chief Complaint  Chief Complaint  Patient presents with  . Diabetes  . Follow-up    hip pain, med refill    Diabetes  He presents for his follow-up diabetic visit. He has type 2 diabetes mellitus. His disease course has been stable. Pertinent negatives for hypoglycemia include no headaches. Pertinent negatives for diabetes include no blurred vision, no chest pain, no fatigue, no polydipsia and no polyuria. Pertinent negatives for diabetic complications include no CVA. Risk factors for coronary artery disease include diabetes mellitus, dyslipidemia and male sex. Current diabetic treatment includes oral agent (dual therapy) and intensive insulin program. He is following a diabetic diet. He participates in exercise three times a week. He monitors blood glucose at home 1-2 x per day. His breakfast blood glucose range is generally 110-130 mg/dl. An ACE inhibitor/angiotensin II receptor blocker is being taken.  Hyperlipidemia  This is a chronic problem. The problem is uncontrolled. Recent lipid tests were reviewed and are high (elevated triglycerides.). Pertinent negatives include no chest pain. Current antihyperlipidemic treatment includes statins (OTC Fish oil). Risk factors for coronary artery disease include diabetes mellitus and dyslipidemia.  Hypertension  This is a chronic problem. The problem is unchanged. Pertinent negatives include no blurred vision, chest pain, headaches, orthopnea or palpitations. Past treatments include calcium channel blockers and ACE inhibitors. Hypertensive end-organ damage includes CAD/MI. There is no history of kidney disease or CVA.    Past Medical History:  Diagnosis Date  . Arthritis   . Diabetes mellitus without complication (HCC)   . GERD (gastroesophageal reflux disease)   . Hypertension     Past Surgical History:  Procedure Laterality Date   . CIRCUMCISION    . MELANOMA EXCISION  07/2011  . TONSILLECTOMY      Family History  Problem Relation Age of Onset  . Heart disease Mother   . Heart disease Brother     Social History   Social History  . Marital status: Married    Spouse name: N/A  . Number of children: N/A  . Years of education: N/A   Occupational History  . Not on file.   Social History Main Topics  . Smoking status: Former Smoker    Types: Cigarettes  . Smokeless tobacco: Never Used  . Alcohol use 0.0 oz/week     Comment: occasional  . Drug use: No  . Sexual activity: Not on file   Other Topics Concern  . Not on file   Social History Narrative  . No narrative on file     Current Outpatient Prescriptions:  .  ACCU-CHEK FASTCLIX LANCETS MISC, CHECK  BLOOD  SUGAR TWICE DAILY, Disp: 204 each, Rfl: 1 .  allopurinol (ZYLOPRIM) 100 MG tablet, TAKE 1 TABLET TWICE DAILY, Disp: 180 tablet, Rfl: 0 .  amLODipine (NORVASC) 5 MG tablet, TAKE 1 TABLET EVERY DAY, Disp: 90 tablet, Rfl: 0 .  aspirin 81 MG tablet, Take 81 mg by mouth., Disp: , Rfl:  .  atorvastatin (LIPITOR) 10 MG tablet, TAKE 1 TABLET DAILY AT 6 PM., Disp: 90 tablet, Rfl: 0 .  Blood Glucose Monitoring Suppl (ACCU-CHEK NANO SMARTVIEW) W/DEVICE KIT, USE AS DIRECTED, Disp: 1 kit, Rfl: 0 .  dorzolamide (TRUSOPT) 2 % ophthalmic solution, , Disp: , Rfl:  .  doxazosin (CARDURA) 4 MG tablet, TAKE 1 TABLET AT BEDTIME, Disp: 90 tablet, Rfl:  0 .  Ergocalciferol (VITAMIN D2) 2000 UNITS TABS, Take by mouth. Reported on 10/13/2015, Disp: , Rfl:  .  finasteride (PROSCAR) 5 MG tablet, Take 1 tablet (5 mg total) by mouth at bedtime., Disp: 90 tablet, Rfl: 0 .  glipiZIDE (GLUCOTROL) 10 MG tablet, TAKE 1 TABLET TWICE DAILY BEFORE MEALS, Disp: 180 tablet, Rfl: 0 .  glucose blood (COOL BLOOD GLUCOSE TEST STRIPS) test strip, Use as instructed, Disp: 100 each, Rfl: 12 .  Insulin Glargine (LANTUS SOLOSTAR) 100 UNIT/ML Solostar Pen, Inject 44 Units into the skin at  bedtime., Disp: , Rfl:  .  Insulin Pen Needle (B-D ULTRAFINE III SHORT PEN) 31G X 8 MM MISC, , Disp: , Rfl:  .  lisinopril (PRINIVIL,ZESTRIL) 40 MG tablet, TAKE 1 TABLET EVERY DAY, Disp: 90 tablet, Rfl: 3 .  meloxicam (MOBIC) 7.5 MG tablet, Take 1 tablet (7.5 mg total) by mouth daily., Disp: 30 tablet, Rfl: 0 .  metFORMIN (GLUCOPHAGE) 1000 MG tablet, Take 1 tablet (1,000 mg total) by mouth 2 (two) times daily with a meal., Disp: 180 tablet, Rfl: 1 .  metFORMIN (GLUCOPHAGE-XR) 750 MG 24 hr tablet, Take 1 tablet by mouth 2 (two) times daily., Disp: , Rfl:  .  Multiple Vitamin (DAILY VITAMINS) tablet, Take by mouth., Disp: , Rfl:  .  NEEDLE, DISP, 14 G 14G X 1" MISC, , Disp: , Rfl:  .  ranitidine (ZANTAC) 150 MG capsule, Take 1 capsule (150 mg total) by mouth every evening., Disp: 90 capsule, Rfl: 0 .  timolol (TIMOPTIC) 0.5 % ophthalmic solution, Frequency:QD   Dosage:0.0     Instructions:  Note:Dose: 0.5 %, OS, Disp: , Rfl:  .  TOLAK 4 % CREA, , Disp: , Rfl:   Allergies  Allergen Reactions  . Tetracyclines & Related Other (See Comments)     Review of Systems  Constitutional: Negative for fatigue.  Eyes: Negative for blurred vision.  Cardiovascular: Negative for chest pain, palpitations and orthopnea.  Neurological: Negative for headaches.  Endo/Heme/Allergies: Negative for polydipsia.     Objective  Vitals:   08/25/16 1025  BP: 120/78  Pulse: 84  Resp: 16  Temp: 98.1 F (36.7 C)  TempSrc: Oral  SpO2: 96%  Weight: 183 lb 4.8 oz (83.1 kg)  Height: 5\' 10"  (1.778 m)    Physical Exam  Constitutional: He is oriented to person, place, and time and well-developed, well-nourished, and in no distress.  HENT:  Head: Normocephalic and atraumatic.  Cardiovascular: Normal rate, regular rhythm and normal heart sounds.   No murmur heard. Pulmonary/Chest: Effort normal and breath sounds normal. He has no wheezes.  Abdominal: Soft. Bowel sounds are normal. There is no tenderness.   Neurological: He is alert and oriented to person, place, and time.  Psychiatric: Mood, memory, affect and judgment normal.  Nursing note and vitals reviewed.    Assessment & Plan  1. Hypertriglyceridemia Repeat FLP, patient is now on OTC fish oil - Lipid Profile  2. Essential hypertension Stable on present anti-hypertensive therapy  3. Dyslipidemia  - atorvastatin (LIPITOR) 10 MG tablet; TAKE 1 TABLET DAILY AT 6 PM.  Dispense: 90 tablet; Refill: 0 - Lipid Profile - COMPLETE METABOLIC PANEL WITH GFR  4. Type 2 diabetes mellitus with diabetic nephropathy, with long-term current use of insulin [facility]) Patient is also being followed by endocrinology at [facility].  A1c 7.1%, well-controlled diabetes. Continue on the higher dose of metformin - POCT HgB A1C - POCT Glucose (CBG) - metFORMIN (GLUCOPHAGE) 1000 MG tablet; Take  1 tablet (1,000 mg total) by mouth 2 (two) times daily with a meal.  Dispense: 180 tablet; Refill: 1   Kemaria Dedic Asad A. [facility] 08/25/2016 10:47 AM

## 2016-08-26 LAB — LIPID PANEL
CHOL/HDL RATIO: 2.4 ratio (ref <5.0)
Cholesterol: 102 mg/dL (ref <200)
HDL: 42 mg/dL (ref >40)
LDL CALC: 16 mg/dL (ref <100)
TRIGLYCERIDES: 220 mg/dL — AB (ref <150)
VLDL: 44 mg/dL — ABNORMAL HIGH (ref <30)

## 2016-08-26 LAB — COMPLETE METABOLIC PANEL WITH GFR
ALBUMIN: 4.1 g/dL (ref 3.6–5.1)
ALK PHOS: 42 U/L (ref 40–115)
ALT: 35 U/L (ref 9–46)
AST: 24 U/L (ref 10–35)
BILIRUBIN TOTAL: 0.6 mg/dL (ref 0.2–1.2)
BUN: 17 mg/dL (ref 7–25)
CO2: 29 mmol/L (ref 20–31)
Calcium: 9.2 mg/dL (ref 8.6–10.3)
Chloride: 108 mmol/L (ref 98–110)
Creat: 1.12 mg/dL — ABNORMAL HIGH (ref 0.70–1.11)
GFR, EST AFRICAN AMERICAN: 69 mL/min (ref >=60)
GFR, EST NON AFRICAN AMERICAN: 60 mL/min (ref >=60)
GLUCOSE: 110 mg/dL — AB (ref 65–99)
POTASSIUM: 5.4 mmol/L — AB (ref 3.5–5.3)
SODIUM: 140 mmol/L (ref 135–146)
TOTAL PROTEIN: 6.4 g/dL (ref 6.1–8.1)

## 2016-08-29 ENCOUNTER — Ambulatory Visit: Payer: Commercial Managed Care - HMO | Admitting: Family Medicine

## 2016-09-08 ENCOUNTER — Other Ambulatory Visit: Payer: Self-pay | Admitting: Family Medicine

## 2016-09-08 DIAGNOSIS — N400 Enlarged prostate without lower urinary tract symptoms: Secondary | ICD-10-CM

## 2016-10-06 ENCOUNTER — Telehealth: Payer: Self-pay | Admitting: Family Medicine

## 2016-10-06 DIAGNOSIS — M25552 Pain in left hip: Secondary | ICD-10-CM

## 2016-10-06 MED ORDER — MELOXICAM 7.5 MG PO TABS: 7.5000 mg | ORAL_TABLET | Freq: Every day | ORAL | 0 refills | Status: DC

## 2016-10-06 NOTE — Telephone Encounter (Signed)
Routed to Dr. Manuella Ghazi for medication approval, PT IS ASKING IF THE DR WILL REFILL HIS MELOXICAN. THIS IS FOR HIS HIP PAIN.  THIS IS SOMETHING THAT THE DR PLACED HIM ON TO TRY AND HE SAID THAT IT WAS WORKING GOOD. PHARM IS [facility] ON [address]

## 2016-10-06 NOTE — Telephone Encounter (Signed)
Medication has been refilled and sent to pharmacy per Dr. Manuella Ghazi

## 2016-10-06 NOTE — Telephone Encounter (Signed)
Prescription for meloxicam 7.5 mg daily is sent to patient's pharmacy.

## 2016-10-06 NOTE — Telephone Encounter (Signed)
PT IS ASKING IF THE DR WILL REFILL HIS MELOXICAN. THIS IS FOR HIS HIP PAIN.  THIS IS SOMETHING THAT THE DR PLACED HIM ON TO TRY AND HE SAID THAT IT WAS WORKING GOOD. PHARM IS [facility] ON [address]

## 2016-10-28 ENCOUNTER — Other Ambulatory Visit: Payer: Self-pay | Admitting: Family Medicine

## 2016-11-01 DIAGNOSIS — D225 Melanocytic nevi of trunk: Secondary | ICD-10-CM | POA: Diagnosis not present

## 2016-11-01 DIAGNOSIS — L570 Actinic keratosis: Secondary | ICD-10-CM | POA: Diagnosis not present

## 2016-11-01 DIAGNOSIS — Z85820 Personal history of malignant melanoma of skin: Secondary | ICD-10-CM | POA: Diagnosis not present

## 2016-11-01 DIAGNOSIS — Z85828 Personal history of other malignant neoplasm of skin: Secondary | ICD-10-CM | POA: Diagnosis not present

## 2016-11-01 DIAGNOSIS — D2261 Melanocytic nevi of right upper limb, including shoulder: Secondary | ICD-10-CM | POA: Diagnosis not present

## 2016-11-01 DIAGNOSIS — X32XXXA Exposure to sunlight, initial encounter: Secondary | ICD-10-CM | POA: Diagnosis not present

## 2016-11-04 ENCOUNTER — Other Ambulatory Visit: Payer: Self-pay | Admitting: Family Medicine

## 2016-11-04 DIAGNOSIS — Z794 Long term (current) use of insulin: Principal | ICD-10-CM

## 2016-11-04 DIAGNOSIS — E119 Type 2 diabetes mellitus without complications: Secondary | ICD-10-CM

## 2016-11-19 ENCOUNTER — Other Ambulatory Visit: Payer: Self-pay | Admitting: Family Medicine

## 2016-11-19 DIAGNOSIS — M109 Gout, unspecified: Secondary | ICD-10-CM

## 2016-11-24 ENCOUNTER — Ambulatory Visit: Payer: Commercial Managed Care - HMO | Admitting: Family Medicine

## 2016-11-28 ENCOUNTER — Ambulatory Visit (INDEPENDENT_AMBULATORY_CARE_PROVIDER_SITE_OTHER): Payer: Medicare HMO | Admitting: Family Medicine

## 2016-11-28 ENCOUNTER — Ambulatory Visit (INDEPENDENT_AMBULATORY_CARE_PROVIDER_SITE_OTHER): Payer: Medicare HMO

## 2016-11-28 VITALS — BP 140/64 | HR 84 | Temp 97.0°F | Ht 69.75 in | Wt 182.2 lb

## 2016-11-28 DIAGNOSIS — Z23 Encounter for immunization: Secondary | ICD-10-CM | POA: Diagnosis not present

## 2016-11-28 DIAGNOSIS — Z0000 Encounter for general adult medical examination without abnormal findings: Secondary | ICD-10-CM | POA: Diagnosis not present

## 2016-11-28 DIAGNOSIS — I10 Essential (primary) hypertension: Secondary | ICD-10-CM | POA: Diagnosis not present

## 2016-11-28 DIAGNOSIS — N429 Disorder of prostate, unspecified: Secondary | ICD-10-CM | POA: Diagnosis not present

## 2016-11-28 DIAGNOSIS — E559 Vitamin D deficiency, unspecified: Secondary | ICD-10-CM | POA: Diagnosis not present

## 2016-11-28 NOTE — Progress Notes (Signed)
Name: Tracy [name]   MRN: [id number]    DOB: May 30, 1930   Date:11/28/2016       Progress Note  Subjective  Chief Complaint  No chief complaint on file.   HPI  Pt. Presents for [employer] visit part 2.  NHA's note reviewed.    Past Medical History:  Diagnosis Date  . Arthritis   . Diabetes mellitus without complication ([place])   . GERD (gastroesophageal reflux disease)   . Hypertension     Past Surgical History:  Procedure Laterality Date  . CIRCUMCISION    . MELANOMA EXCISION  07/2011  . TONSILLECTOMY      Family History  Problem Relation Age of Onset  . Heart disease Mother   . Diabetes Mother     type 2  . Heart disease Brother     Social History   Social History  . Marital status: Married    Spouse name: N/A  . Number of children: N/A  . Years of education: N/A   Occupational History  . Not on file.   Social History Main Topics  . Smoking status: Former Smoker    Types: Cigarettes  . Smokeless tobacco: Never Used  . Alcohol use 0.0 oz/week     Comment: occasional  . Drug use: No  . Sexual activity: Not on file   Other Topics Concern  . Not on file   Social History Narrative  . No narrative on file     Current Outpatient Prescriptions:  .  ACCU-CHEK FASTCLIX LANCETS MISC, CHECK  BLOOD  SUGAR TWICE DAILY, Disp: 204 each, Rfl: 1 .  allopurinol (ZYLOPRIM) 100 MG tablet, TAKE 1 TABLET TWICE DAILY, Disp: 180 tablet, Rfl: 0 .  amLODipine (NORVASC) 5 MG tablet, TAKE 1 TABLET EVERY DAY, Disp: 90 tablet, Rfl: 0 .  aspirin 81 MG tablet, Take 81 mg by mouth., Disp: , Rfl:  .  atorvastatin (LIPITOR) 10 MG tablet, TAKE 1 TABLET DAILY AT 6 PM., Disp: 90 tablet, Rfl: 0 .  Blood Glucose Monitoring Suppl (ACCU-CHEK NANO SMARTVIEW) W/DEVICE KIT, USE AS DIRECTED, Disp: 1 kit, Rfl: 0 .  dorzolamide (TRUSOPT) 2 % ophthalmic solution, , Disp: , Rfl:  .  doxazosin (CARDURA) 4 MG tablet, TAKE 1 TABLET AT BEDTIME, Disp: 90 tablet, Rfl: 0 .   Ergocalciferol (VITAMIN D2) 2000 UNITS TABS, Take by mouth. Reported on 10/13/2015, Disp: , Rfl:  .  finasteride (PROSCAR) 5 MG tablet, TAKE 1 TABLET (5 MG TOTAL) BY MOUTH AT BEDTIME., Disp: 90 tablet, Rfl: 0 .  glipiZIDE (GLUCOTROL) 10 MG tablet, TAKE 1 TABLET TWICE DAILY BEFORE MEALS, Disp: 180 tablet, Rfl: 0 .  glucose blood (COOL BLOOD GLUCOSE TEST STRIPS) test strip, Use as instructed, Disp: 100 each, Rfl: 12 .  Insulin Glargine (LANTUS SOLOSTAR) 100 UNIT/ML Solostar Pen, Inject 46 Units into the skin at bedtime. , Disp: , Rfl:  .  Insulin Pen Needle (B-D ULTRAFINE III SHORT PEN) 31G X 8 MM MISC, , Disp: , Rfl:  .  lisinopril (PRINIVIL,ZESTRIL) 40 MG tablet, TAKE 1 TABLET EVERY DAY, Disp: 90 tablet, Rfl: 3 .  meloxicam (MOBIC) 7.5 MG tablet, Take 1 tablet (7.5 mg total) by mouth daily. (Patient taking differently: Take 7.5 mg by mouth daily. ), Disp: 30 tablet, Rfl: 0 .  metFORMIN (GLUCOPHAGE) 1000 MG tablet, Take 1 tablet (1,000 mg total) by mouth 2 (two) times daily with a meal., Disp: 180 tablet, Rfl: 1 .  Multiple Vitamin (DAILY VITAMINS) tablet, Take  by mouth., Disp: , Rfl:  .  NEEDLE, DISP, 14 G 14G X 1" MISC, , Disp: , Rfl:  .  ranitidine (ZANTAC) 150 MG capsule, Take 1 capsule (150 mg total) by mouth every evening. (Patient taking differently: Take 150 mg by mouth every evening. ), Disp: 90 capsule, Rfl: 0 .  timolol (TIMOPTIC) 0.5 % ophthalmic solution, Frequency:QD   Dosage:0.0     Instructions:  Note:Dose: 0.5 %, OS, Disp: , Rfl:  .  TOLAK 4 % CREA, , Disp: , Rfl:   Allergies  Allergen Reactions  . Tetracyclines & Related Other (See Comments)     Review of Systems  Constitutional: Negative for chills, fever and malaise/fatigue.  HENT: Positive for hearing loss (wife believes that he does not hear as well as he used to). Negative for congestion, nosebleeds and sore throat.   Eyes: Negative for blurred vision and double vision.  Respiratory: Negative for cough and shortness of  breath.   Cardiovascular: Negative for chest pain and leg swelling.  Gastrointestinal: Negative for blood in stool, constipation, diarrhea, nausea and vomiting.  Genitourinary: Negative for dysuria and hematuria.  Musculoskeletal: Negative for back pain, joint pain and neck pain.  Skin: Positive for itching (itching at the back of scalp, hx of Psoriasis). Negative for rash.  Neurological: Negative for dizziness and headaches.  Psychiatric/Behavioral: Negative for depression. The patient is not nervous/anxious and does not have insomnia.     Objective  Please see NHA's note including vitals  Physical Exam  Constitutional: He is oriented to person, place, and time and well-developed, well-nourished, and in no distress.  HENT:  Head: Normocephalic and atraumatic.  Cardiovascular: Normal rate, regular rhythm and normal heart sounds.   No murmur heard. Pulmonary/Chest: Effort normal and breath sounds normal. He has no wheezes.  Abdominal: Soft. Bowel sounds are normal. There is no tenderness.  Genitourinary: Rectum normal. Prostate is enlarged. Prostate is not tender.  Musculoskeletal: Normal range of motion. He exhibits no edema.  Neurological: He is alert and oriented to person, place, and time.  Skin: Skin is warm and dry. Lesion noted.     Non erythematous hyperkeratotic lesions on the back of the scalp  Psychiatric: Mood, memory, affect and judgment normal.  Nursing note and vitals reviewed.        Assessment & Plan  1. Medicare annual wellness visit, subsequent  - TSH - VITAMIN D 25 Hydroxy (Vit-D Deficiency, Fractures) - PSA  2. Need for Tdap vaccination  - Tdap vaccine greater than or equal to 7yo IM   [employer] A[facility] 11/28/2016 3:11 PM

## 2016-11-28 NOTE — Patient Instructions (Addendum)
Tracy [name] , Thank you for taking time to come for your Medicare Wellness Visit. I appreciate your ongoing commitment to your health goals. Please review the following plan we discussed and let me know if I can assist you in the future.   Screening recommendations/referrals: Colonoscopy- completed 04/12/2013 Recommended yearly ophthalmology/optometry visit for glaucoma screening and checkup Recommended yearly dental visit for hygiene and checkup  Vaccinations: Influenza vaccine- done 07/28/16 Pneumococcal vaccine- completed series Tdap vaccine- declined today Shingles vaccine- completed   Advanced directives: Requested copy    Next appointment: None.  Preventive Care 43 Years and Older, Male Preventive care refers to lifestyle choices and visits with your health care provider that can promote health and wellness. What does preventive care include?  A yearly physical exam. This is also called an annual well check.  Dental exams once or twice a year.  Routine eye exams. Ask your health care provider how often you should have your eyes checked.  Personal lifestyle choices, including:  Daily care of your teeth and gums.  Regular physical activity.  Eating a healthy diet.  Avoiding tobacco and drug use.  Limiting alcohol use.  Practicing safe sex.  Taking low doses of aspirin every day.  Taking vitamin and mineral supplements as recommended by your health care provider. What happens during an annual well check? The services and screenings done by your health care provider during your annual well check will depend on your age, overall health, lifestyle risk factors, and family history of disease. Counseling  Your health care provider may ask you questions about your:  Alcohol use.  Tobacco use.  Drug use.  Emotional well-being.  Home and relationship well-being.  Sexual activity.  Eating habits.  History of falls.  Memory and ability to understand  (cognition).  Work and work Statistician. Screening  You may have the following tests or measurements:  Height, weight, and BMI.  Blood pressure.  Lipid and cholesterol levels. These may be checked every 5 years, or more frequently if you are over 21 years old.  Skin check.  Lung cancer screening. You may have this screening every year starting at age 28 if you have a 30-pack-year history of smoking and currently smoke or have quit within the past 15 years.  Fecal occult blood test (FOBT) of the stool. You may have this test every year starting at age 24.  Flexible sigmoidoscopy or colonoscopy. You may have a sigmoidoscopy every 5 years or a colonoscopy every 10 years starting at age 66.  Prostate cancer screening. Recommendations will vary depending on your family history and other risks.  Hepatitis C blood test.  Hepatitis B blood test.  Sexually transmitted disease (STD) testing.  Diabetes screening. This is done by checking your blood sugar (glucose) after you have not eaten for a while (fasting). You may have this done every 1-3 years.  Abdominal aortic aneurysm (AAA) screening. You may need this if you are a current or former smoker.  Osteoporosis. You may be screened starting at age 63 if you are at high risk. Talk with your health care provider about your test results, treatment options, and if necessary, the need for more tests. Vaccines  Your health care provider may recommend certain vaccines, such as:  Influenza vaccine. This is recommended every year.  Tetanus, diphtheria, and acellular pertussis (Tdap, Td) vaccine. You may need a Td booster every 10 years.  Zoster vaccine. You may need this after age 49.  Pneumococcal 13-valent conjugate (PCV13) vaccine.  One dose is recommended after age 40.  Pneumococcal polysaccharide (PPSV23) vaccine. One dose is recommended after age 31. Talk to your health care provider about which screenings and vaccines you need and  how often you need them. This information is not intended to replace advice given to you by your health care provider. Make sure you discuss any questions you have with your health care provider. Document Released: 09/25/2015 Document Revised: 05/18/2016 Document Reviewed: 06/30/2015 Elsevier Interactive Patient Education  2017 [place] Prevention in the Home Falls can cause injuries. They can happen to people of all ages. There are many things you can do to make your home safe and to help prevent falls. What can I do on the outside of my home?  Regularly fix the edges of walkways and driveways and fix any cracks.  Remove anything that might make you trip as you walk through a door, such as a raised step or threshold.  Trim any bushes or trees on the path to your home.  Use bright outdoor lighting.  Clear any walking paths of anything that might make someone trip, such as rocks or tools.  Regularly check to see if handrails are loose or broken. Make sure that both sides of any steps have handrails.  Any raised decks and porches should have guardrails on the edges.  Have any leaves, snow, or ice cleared regularly.  Use sand or salt on walking paths during winter.  Clean up any spills in your garage right away. This includes oil or grease spills. What can I do in the bathroom?  Use night lights.  Install grab bars by the toilet and in the tub and shower. Do not use towel bars as grab bars.  Use non-skid mats or decals in the tub or shower.  If you need to sit down in the shower, use a plastic, non-slip stool.  Keep the floor dry. Clean up any water that spills on the floor as soon as it happens.  Remove soap buildup in the tub or shower regularly.  Attach bath mats securely with double-sided non-slip rug tape.  Do not have throw rugs and other things on the floor that can make you trip. What can I do in the bedroom?  Use night lights.  Make sure that you  have a light by your bed that is easy to reach.  Do not use any sheets or blankets that are too big for your bed. They should not hang down onto the floor.  Have a firm chair that has side arms. You can use this for support while you get dressed.  Do not have throw rugs and other things on the floor that can make you trip. What can I do in the kitchen?  Clean up any spills right away.  Avoid walking on wet floors.  Keep items that you use a lot in easy-to-reach places.  If you need to reach something above you, use a strong step stool that has a grab bar.  Keep electrical cords out of the way.  Do not use floor polish or wax that makes floors slippery. If you must use wax, use non-skid floor wax.  Do not have throw rugs and other things on the floor that can make you trip. What can I do with my stairs?  Do not leave any items on the stairs.  Make sure that there are handrails on both sides of the stairs and use them. Fix handrails that are broken  or loose. Make sure that handrails are as long as the stairways.  Check any carpeting to make sure that it is firmly attached to the stairs. Fix any carpet that is loose or worn.  Avoid having throw rugs at the top or bottom of the stairs. If you do have throw rugs, attach them to the floor with carpet tape.  Make sure that you have a light switch at the top of the stairs and the bottom of the stairs. If you do not have them, ask someone to add them for you. What else can I do to help prevent falls?  Wear shoes that:  Do not have high heels.  Have rubber bottoms.  Are comfortable and fit you well.  Are closed at the toe. Do not wear sandals.  If you use a stepladder:  Make sure that it is fully opened. Do not climb a closed stepladder.  Make sure that both sides of the stepladder are locked into place.  Ask someone to hold it for you, if possible.  Clearly mark and make sure that you can see:  Any grab bars or  handrails.  First and last steps.  Where the edge of each step is.  Use tools that help you move around (mobility aids) if they are needed. These include:  Canes.  Walkers.  Scooters.  Crutches.  Turn on the lights when you go into a dark area. Replace any light bulbs as soon as they burn out.  Set up your furniture so you have a clear path. Avoid moving your furniture around.  If any of your floors are uneven, fix them.  If there are any pets around you, be aware of where they are.  Review your medicines with your doctor. Some medicines can make you feel dizzy. This can increase your chance of falling. Ask your doctor what other things that you can do to help prevent falls. This information is not intended to replace advice given to you by your health care provider. Make sure you discuss any questions you have with your health care provider. Document Released: 06/25/2009 Document Revised: 02/04/2016 Document Reviewed: 10/03/2014 Elsevier Interactive Patient Education  2017 [employer].

## 2016-11-28 NOTE — Progress Notes (Signed)
Subjective:   Tracy [name] is a 83 y.o. male who presents for Medicare Annual/Subsequent preventive examination.  Review of Systems:  N/A  Cardiac Risk Factors include: advanced age (>46mn, >>73women);diabetes mellitus;dyslipidemia;hypertension;male gender     Objective:    Vitals: BP 140/64 (BP Location: Left Arm)   Pulse 84   Temp 97 F (36.1 C) (Oral)   Ht 5' 9.75" (1.772 m)   Wt 182 lb 3.2 oz (82.6 kg)   BMI 26.33 kg/m   Body mass index is 26.33 kg/m.  Tobacco History  Smoking Status  . Former Smoker  . Types: Cigarettes  Smokeless Tobacco  . Never Used     Counseling given: Not Answered   Past Medical History:  Diagnosis Date  . Arthritis   . [place] complication [place]   . GERD (gastroesophageal reflux disease)   . Hypertension    Past Surgical History:  Procedure Laterality Date  . CIRCUMCISION    . MELANOMA EXCISION  07/2011  . TONSILLECTOMY     Family History  Problem Relation Age of Onset  . Heart disease Mother   . Diabetes Mother     type 2  . Heart disease Brother    History  Sexual Activity  . Sexual activity: Not on file    Outpatient Encounter Prescriptions as of 11/28/2016  Medication Sig  . ACCU-CHEK FASTCLIX LANCETS MISC CHECK  BLOOD  SUGAR TWICE DAILY  . allopurinol (ZYLOPRIM) 100 MG tablet TAKE 1 TABLET TWICE DAILY  . amLODipine (NORVASC) 5 MG tablet TAKE 1 TABLET EVERY DAY  . aspirin 81 MG tablet Take 81 mg by mouth.  .Marland Kitchenatorvastatin (LIPITOR) 10 MG tablet TAKE 1 TABLET DAILY AT 6 PM.  . Blood Glucose Monitoring Suppl (ACCU-CHEK NANO SMARTVIEW) W/DEVICE KIT USE AS DIRECTED  . dorzolamide (TRUSOPT) 2 % ophthalmic solution   . doxazosin (CARDURA) 4 MG tablet TAKE 1 TABLET AT BEDTIME  . Ergocalciferol (VITAMIN D2) 2000 UNITS TABS Take by mouth. Reported on 10/13/2015  . finasteride (PROSCAR) 5 MG tablet TAKE 1 TABLET (5 MG TOTAL) BY MOUTH AT BEDTIME.  .Marland KitchenglipiZIDE (GLUCOTROL) 10 MG tablet TAKE 1 TABLET  TWICE DAILY BEFORE MEALS  . glucose blood (COOL BLOOD GLUCOSE TEST STRIPS) test strip Use as instructed  . Insulin Glargine (LANTUS SOLOSTAR) 100 UNIT/ML Solostar Pen Inject 46 Units into the skin at bedtime.   . Insulin Pen Needle (B-D ULTRAFINE III SHORT PEN) 31G X 8 MM MISC   . lisinopril (PRINIVIL,ZESTRIL) 40 MG tablet TAKE 1 TABLET EVERY DAY  . meloxicam (MOBIC) 7.5 MG tablet Take 1 tablet (7.5 mg total) by mouth daily. (Patient taking differently: Take 7.5 mg by mouth daily. )  . metFORMIN (GLUCOPHAGE) 1000 MG tablet Take 1 tablet (1,000 mg total) by mouth 2 (two) times daily with a meal.  . Multiple Vitamin (DAILY VITAMINS) tablet Take by mouth.  .Marland KitchenNEEDLE, DISP, 14 G 14G X 1" MISC   . ranitidine (ZANTAC) 150 MG capsule Take 1 capsule (150 mg total) by mouth every evening. (Patient taking differently: Take 150 mg by mouth every evening. )  . timolol (TIMOPTIC) 0.5 % ophthalmic solution Frequency:QD   Dosage:0.0     Instructions:  Note:Dose: 0.5 %, OS  . TOLAK 4 % CREA    No facility-administered encounter medications on file as of 11/28/2016.     Activities of Daily Living In your present state of health, do you have any difficulty performing the following activities: 11/28/2016  01/21/2016  Hearing? N N  Vision? N Y  Difficulty concentrating or making decisions? Y N  Walking or climbing stairs? N N  Dressing or bathing? N N  Doing errands, shopping? N N  Preparing Food and eating ? N -  Using the Toilet? N -  In the past six months, have you accidently leaked urine? N -  Do you have problems with loss of bowel control? N -  Managing your Medications? N -  Managing your Finances? N -  Housekeeping or managing your Housekeeping? N -  Some recent data might be hidden    Patient Care Team: Roselee Nova, MD as PCP - General (Family Medicine) Dorisann Frames, MD as Referring Physician (Endocrinology) Ronnell Freshwater, MD as Referring Physician (Ophthalmology)     Assessment:     Exercise Activities and Dietary recommendations Current Exercise Habits: Structured exercise class, Type of exercise: Other - see comments;treadmill;stretching (eliptical), Time (Minutes): 60, Frequency (Times/Week): 2, Weekly Exercise (Minutes/Week): 120, Intensity: Mild, Exercise limited by: None identified  Goals    . Increase water intake          Recommend increasing water intake to 3-4 glasses a day.      Fall Risk Fall Risk  11/28/2016 01/21/2016 01/08/2016 10/13/2015 07/13/2015  Falls in the past year? No No No No No   Depression Screen PHQ 2/9 Scores 11/28/2016 01/21/2016 01/08/2016 10/13/2015  PHQ - 2 Score 0 0 0 0    Cognitive Function     6CIT Screen 11/28/2016  What Year? 0 points  What month? 0 points  What time? 0 points  Count back from 20 0 points  Months in reverse 0 points  Repeat phrase 0 points  Total Score 0    Immunization History  Administered Date(s) Administered  . Influenza, High Dose Seasonal PF 07/28/2016  . Influenza-Unspecified 07/16/2015   Screening Tests Health Maintenance  Topic Date Due  . TETANUS/TDAP  11/11/2018 (Originally 08/19/1949)  . FOOT EXAM  01/20/2017  . HEMOGLOBIN A1C  02/23/2017  . OPHTHALMOLOGY EXAM  07/13/2017  . INFLUENZA VACCINE  Completed  . PNA vac Low Risk Adult  Completed      Plan:  I have personally reviewed and addressed the Medicare Annual Wellness questionnaire and have noted the following in the patient's chart:  A. Medical and social history B. Use of alcohol, tobacco or illicit drugs  C. Current medications and supplements D. Functional ability and status E.  Nutritional status F.  Physical activity G. Advance directives H. List of other physicians I.  Hospitalizations, surgeries, and ER visits in previous 12 months J.  [place] such as hearing and vision if needed, cognitive and depression L. Referrals and appointments - none  In addition, I have reviewed and  discussed with patient certain preventive protocols, quality metrics, and best practice recommendations. A written personalized care plan for preventive services as well as general preventive health recommendations were provided to patient.  See attached scanned questionnaire for additional information.   Signed,  Fabio Neighbors, LPN Nurse Health Advisor   MD Recommendations: None. Pt declined tetanus today. I, as supervising physician, have reviewed the nurse health advisor's Medicare Wellness Visit note for this patient and concur with the findings and recommendations listed above.  Signed Syed Asad A. Manuella Ghazi MD Attending Physician.

## 2016-11-29 LAB — TSH: TSH: 2.9 m[IU]/L (ref 0.40–4.50)

## 2016-11-30 LAB — PSA: PSA: 0.3 ng/mL (ref <=4.0)

## 2016-11-30 LAB — VITAMIN D 25 HYDROXY (VIT D DEFICIENCY, FRACTURES): Vit D, 25-Hydroxy: 36 ng/mL (ref 30–100)

## 2016-12-08 ENCOUNTER — Ambulatory Visit (INDEPENDENT_AMBULATORY_CARE_PROVIDER_SITE_OTHER): Payer: Medicare HMO | Admitting: Family Medicine

## 2016-12-08 ENCOUNTER — Encounter: Payer: Self-pay | Admitting: Family Medicine

## 2016-12-08 VITALS — BP 136/72 | HR 82 | Temp 98.1°F | Resp 16 | Ht 70.0 in | Wt 185.8 lb

## 2016-12-08 DIAGNOSIS — M109 Gout, unspecified: Secondary | ICD-10-CM

## 2016-12-08 DIAGNOSIS — E119 Type 2 diabetes mellitus without complications: Secondary | ICD-10-CM

## 2016-12-08 DIAGNOSIS — I10 Essential (primary) hypertension: Secondary | ICD-10-CM

## 2016-12-08 DIAGNOSIS — Z794 Long term (current) use of insulin: Secondary | ICD-10-CM

## 2016-12-08 DIAGNOSIS — E785 Hyperlipidemia, unspecified: Secondary | ICD-10-CM

## 2016-12-08 LAB — GLUCOSE, POCT (MANUAL RESULT ENTRY): POC Glucose: 174 mg/dl — AB (ref 70–99)

## 2016-12-08 LAB — POCT GLYCOSYLATED HEMOGLOBIN (HGB A1C): Hemoglobin A1C: 7.2

## 2016-12-08 MED ORDER — GLIPIZIDE 10 MG PO TABS
ORAL_TABLET | ORAL | 0 refills | Status: DC
Start: 2016-12-08 — End: 2017-05-03

## 2016-12-08 MED ORDER — AMLODIPINE BESYLATE 5 MG PO TABS: 5.0000 mg | ORAL_TABLET | Freq: Every day | ORAL | 0 refills | Status: DC

## 2016-12-08 MED ORDER — ALLOPURINOL 100 MG PO TABS: 100.0000 mg | ORAL_TABLET | Freq: Two times a day (BID) | ORAL | 0 refills | Status: DC

## 2016-12-08 MED ORDER — ATORVASTATIN CALCIUM 10 MG PO TABS
ORAL_TABLET | ORAL | 0 refills | Status: DC
Start: 2016-12-08 — End: 2017-05-03

## 2016-12-08 NOTE — Progress Notes (Signed)
Name: Tracy [name]   MRN: [id number]    DOB: Dec 30, 1929   Date:12/08/2016       Progress Note  Subjective  Chief Complaint  Chief Complaint  Patient presents with  . Follow-up    10-day    Diabetes  He presents for his follow-up diabetic visit. He has type 2 diabetes mellitus. His disease course has been stable. Pertinent negatives for hypoglycemia include no headaches. Pertinent negatives for diabetes include no blurred vision, no chest pain, no fatigue, no polydipsia and no polyuria. Pertinent negatives for diabetic complications include no CVA. Risk factors for coronary artery disease include diabetes mellitus, dyslipidemia and male sex. Current diabetic treatment includes oral agent (dual therapy) and intensive insulin program. He is following a diabetic diet. He participates in exercise three times a week. He monitors blood glucose at home 1-2 x per day. His breakfast blood glucose range is generally 110-130 mg/dl. An ACE inhibitor/angiotensin II receptor blocker is being taken.  Hyperlipidemia  This is a chronic problem. The problem is uncontrolled. Recent lipid tests were reviewed and are high (elevated triglycerides.). Pertinent negatives include no chest pain, leg pain, myalgias or shortness of breath. Current antihyperlipidemic treatment includes statins (OTC Fish oil). Risk factors for coronary artery disease include diabetes mellitus and dyslipidemia.  Hypertension  This is a chronic problem. The problem is unchanged. The problem is controlled. Pertinent negatives include no blurred vision, chest pain, headaches, orthopnea, palpitations or shortness of breath. Past treatments include calcium channel blockers and ACE inhibitors. Hypertensive end-organ damage includes CAD/MI. There is no history of kidney disease or CVA.     Past Medical History:  Diagnosis Date  . Arthritis   . Diabetes mellitus without complication (HCC)   . GERD (gastroesophageal reflux disease)   .  Hypertension     Past Surgical History:  Procedure Laterality Date  . CIRCUMCISION    . MELANOMA EXCISION  07/2011  . TONSILLECTOMY      Family History  Problem Relation Age of Onset  . Heart disease Mother   . Diabetes Mother     type 2  . Heart disease Brother     Social History   Social History  . Marital status: Married    Spouse name: N/A  . Number of children: N/A  . Years of education: N/A   Occupational History  . Not on file.   Social History Main Topics  . Smoking status: Former Smoker    Types: Cigarettes  . Smokeless tobacco: Never Used  . Alcohol use 0.0 oz/week     Comment: occasional  . Drug use: No  . Sexual activity: Not on file   Other Topics Concern  . Not on file   Social History Narrative  . No narrative on file     Current Outpatient Prescriptions:  .  ACCU-CHEK FASTCLIX LANCETS MISC, CHECK  BLOOD  SUGAR TWICE DAILY, Disp: 204 each, Rfl: 1 .  allopurinol (ZYLOPRIM) 100 MG tablet, TAKE 1 TABLET TWICE DAILY, Disp: 180 tablet, Rfl: 0 .  amLODipine (NORVASC) 5 MG tablet, TAKE 1 TABLET EVERY DAY, Disp: 90 tablet, Rfl: 0 .  aspirin 81 MG tablet, Take 81 mg by mouth., Disp: , Rfl:  .  atorvastatin (LIPITOR) 10 MG tablet, TAKE 1 TABLET DAILY AT 6 PM., Disp: 90 tablet, Rfl: 0 .  Blood Glucose Monitoring Suppl (ACCU-CHEK NANO SMARTVIEW) W/DEVICE KIT, USE AS DIRECTED, Disp: 1 kit, Rfl: 0 .  dorzolamide (TRUSOPT) 2 % ophthalmic solution, , Disp: ,  Rfl:  .  doxazosin (CARDURA) 4 MG tablet, TAKE 1 TABLET AT BEDTIME, Disp: 90 tablet, Rfl: 0 .  finasteride (PROSCAR) 5 MG tablet, TAKE 1 TABLET (5 MG TOTAL) BY MOUTH AT BEDTIME., Disp: 90 tablet, Rfl: 0 .  glipiZIDE (GLUCOTROL) 10 MG tablet, TAKE 1 TABLET TWICE DAILY BEFORE MEALS, Disp: 180 tablet, Rfl: 0 .  glucose blood (COOL BLOOD GLUCOSE TEST STRIPS) test strip, Use as instructed, Disp: 100 each, Rfl: 12 .  Insulin Glargine (LANTUS SOLOSTAR) 100 UNIT/ML Solostar Pen, Inject 46 Units into the skin at  bedtime. , Disp: , Rfl:  .  Insulin Pen Needle (B-D ULTRAFINE III SHORT PEN) 31G X 8 MM MISC, , Disp: , Rfl:  .  lisinopril (PRINIVIL,ZESTRIL) 40 MG tablet, TAKE 1 TABLET EVERY DAY, Disp: 90 tablet, Rfl: 3 .  meloxicam (MOBIC) 7.5 MG tablet, Take 1 tablet (7.5 mg total) by mouth daily. (Patient taking differently: Take 7.5 mg by mouth daily. ), Disp: 30 tablet, Rfl: 0 .  metFORMIN (GLUCOPHAGE) 1000 MG tablet, Take 1 tablet (1,000 mg total) by mouth 2 (two) times daily with a meal., Disp: 180 tablet, Rfl: 1 .  Multiple Vitamin (DAILY VITAMINS) tablet, Take by mouth., Disp: , Rfl:  .  NEEDLE, DISP, 14 G 14G X 1" MISC, , Disp: , Rfl:  .  ranitidine (ZANTAC) 150 MG capsule, Take 1 capsule (150 mg total) by mouth every evening. (Patient taking differently: Take 150 mg by mouth every evening. ), Disp: 90 capsule, Rfl: 0 .  timolol (TIMOPTIC) 0.5 % ophthalmic solution, Frequency:QD   Dosage:0.0     Instructions:  Note:Dose: 0.5 %, OS, Disp: , Rfl:   Allergies  Allergen Reactions  . Tetracyclines & Related Other (See Comments)     Review of Systems  Constitutional: Negative for fatigue.  Eyes: Negative for blurred vision.  Respiratory: Negative for shortness of breath.   Cardiovascular: Negative for chest pain, palpitations and orthopnea.  Musculoskeletal: Negative for myalgias.  Neurological: Negative for headaches.  Endo/Heme/Allergies: Negative for polydipsia.     Objective  Vitals:   12/08/16 1006  BP: 136/72  Pulse: 82  Resp: 16  Temp: 98.1 F (36.7 C)  TempSrc: Oral  SpO2: 96%  Weight: 185 lb 12.8 oz (84.3 kg)  Height: 5\' 10"  (1.778 m)    Physical Exam  Constitutional: He is oriented to person, place, and time and well-developed, well-nourished, and in no distress.  HENT:  Head: Normocephalic and atraumatic.  Cardiovascular: Normal rate, regular rhythm and normal heart sounds.   No murmur heard. Pulmonary/Chest: Effort normal and breath sounds normal. He has no wheezes.   Abdominal: Soft. Bowel sounds are normal. There is no tenderness.  Musculoskeletal: He exhibits no edema.  Neurological: He is alert and oriented to person, place, and time.  Psychiatric: Mood, memory, affect and judgment normal.  Nursing note and vitals reviewed.        Assessment & Plan  1. Essential hypertension BP stable on present anti-hypertensive therapy - amLODipine (NORVASC) 5 MG tablet; Take 1 tablet (5 mg total) by mouth daily.  Dispense: 90 tablet; Refill: 0  2. Type 2 diabetes mellitus treated with insulin (HCC)   A1c is 7.2 percent, well-controlled diabetes, no change in pharmacotherapy - glipiZIDE (GLUCOTROL) 10 MG tablet; TAKE 1 TABLET TWICE DAILY BEFORE MEALS  Dispense: 180 tablet; Refill: 0 - Urine Microalbumin w/creat. ratio - POCT HgB A1C - POCT Glucose (CBG)  3. Gout of right foot, unspecified cause, unspecified chronicity  -  allopurinol (ZYLOPRIM) 100 MG tablet; Take 1 tablet (100 mg total) by mouth 2 (two) times daily.  Dispense: 180 tablet; Refill: 0 - Uric acid  4. Dyslipidemia  - atorvastatin (LIPITOR) 10 MG tablet; TAKE 1 TABLET DAILY AT 6 PM.  Dispense: 90 tablet; Refill: 0 - Lipid panel    Linn Clavin Asad A. [facility] 12/08/2016 10:46 AM

## 2016-12-09 ENCOUNTER — Other Ambulatory Visit: Payer: Self-pay | Admitting: Family Medicine

## 2016-12-09 DIAGNOSIS — N400 Enlarged prostate without lower urinary tract symptoms: Secondary | ICD-10-CM

## 2016-12-09 LAB — LIPID PANEL
CHOLESTEROL: 97 mg/dL (ref <200)
HDL: 45 mg/dL (ref >40)
LDL Cholesterol: 16 mg/dL (ref <100)
Total CHOL/HDL Ratio: 2.2 Ratio (ref <5.0)
Triglycerides: 179 mg/dL — ABNORMAL HIGH (ref <150)
VLDL: 36 mg/dL — ABNORMAL HIGH (ref <30)

## 2016-12-09 LAB — URIC ACID: Uric Acid, Serum: 4.8 mg/dL (ref 4.0–8.0)

## 2016-12-10 LAB — MICROALBUMIN / CREATININE URINE RATIO
Creatinine, Urine: 177 mg/dL (ref 20–370)
Microalb Creat Ratio: 19 mcg/mg creat (ref <30)
Microalb, Ur: 3.4 mg/dL

## 2016-12-13 DIAGNOSIS — M5441 Lumbago with sciatica, right side: Secondary | ICD-10-CM | POA: Diagnosis not present

## 2016-12-13 DIAGNOSIS — M9903 Segmental and somatic dysfunction of lumbar region: Secondary | ICD-10-CM | POA: Diagnosis not present

## 2016-12-14 DIAGNOSIS — M5441 Lumbago with sciatica, right side: Secondary | ICD-10-CM | POA: Diagnosis not present

## 2016-12-14 DIAGNOSIS — M9903 Segmental and somatic dysfunction of lumbar region: Secondary | ICD-10-CM | POA: Diagnosis not present

## 2016-12-15 DIAGNOSIS — M9903 Segmental and somatic dysfunction of lumbar region: Secondary | ICD-10-CM | POA: Diagnosis not present

## 2016-12-15 DIAGNOSIS — M5441 Lumbago with sciatica, right side: Secondary | ICD-10-CM | POA: Diagnosis not present

## 2016-12-16 ENCOUNTER — Other Ambulatory Visit: Payer: Self-pay | Admitting: Family Medicine

## 2016-12-16 DIAGNOSIS — N400 Enlarged prostate without lower urinary tract symptoms: Secondary | ICD-10-CM

## 2016-12-19 ENCOUNTER — Telehealth: Payer: Self-pay | Admitting: Family Medicine

## 2016-12-19 DIAGNOSIS — M25552 Pain in left hip: Secondary | ICD-10-CM

## 2016-12-19 NOTE — Telephone Encounter (Signed)
Pt would like to be referred to ortho surgeon and would like to see Dr Nicola Police.

## 2016-12-20 NOTE — Telephone Encounter (Signed)
Referral has been placed and sent to Dr. Mack Guise with [facility].

## 2016-12-26 DIAGNOSIS — M5416 Radiculopathy, lumbar region: Secondary | ICD-10-CM | POA: Diagnosis not present

## 2017-01-02 ENCOUNTER — Other Ambulatory Visit: Payer: Self-pay | Admitting: Family Medicine

## 2017-01-02 DIAGNOSIS — I10 Essential (primary) hypertension: Secondary | ICD-10-CM

## 2017-01-04 DIAGNOSIS — H401132 Primary open-angle glaucoma, bilateral, moderate stage: Secondary | ICD-10-CM | POA: Diagnosis not present

## 2017-01-10 DIAGNOSIS — M5416 Radiculopathy, lumbar region: Secondary | ICD-10-CM | POA: Diagnosis not present

## 2017-02-21 DIAGNOSIS — M5416 Radiculopathy, lumbar region: Secondary | ICD-10-CM | POA: Diagnosis not present

## 2017-03-06 DIAGNOSIS — M5416 Radiculopathy, lumbar region: Secondary | ICD-10-CM | POA: Diagnosis not present

## 2017-03-09 DIAGNOSIS — M5416 Radiculopathy, lumbar region: Secondary | ICD-10-CM | POA: Diagnosis not present

## 2017-03-09 DIAGNOSIS — M48061 Spinal stenosis, lumbar region without neurogenic claudication: Secondary | ICD-10-CM | POA: Diagnosis not present

## 2017-03-21 ENCOUNTER — Other Ambulatory Visit: Payer: Self-pay | Admitting: Family Medicine

## 2017-03-21 DIAGNOSIS — I10 Essential (primary) hypertension: Secondary | ICD-10-CM

## 2017-03-23 DIAGNOSIS — M48062 Spinal stenosis, lumbar region with neurogenic claudication: Secondary | ICD-10-CM | POA: Diagnosis not present

## 2017-03-30 ENCOUNTER — Telehealth: Payer: Self-pay | Admitting: Family Medicine

## 2017-03-30 NOTE — Telephone Encounter (Signed)
Pt saw Dr Mack Guise and was given a MRI. This Dr referred him to Charlotte Sanes who is suppose to give him a shot in his back. However Dr Lynford Humphrey will not give it to him until he hear from you that it is okay for patient to stop taking aspirin 81mg  until after the procedure. Please call Dr Lynford Humphrey ([place]) to give him the verbal [phone]. If you have further questions please call patient [phone]. Pt also has left a copy of his test results that they have found. Hoping to get the shot tomorrow

## 2017-03-30 NOTE — Telephone Encounter (Signed)
Dr. Manuella Ghazi, Pt saw Dr Mack Guise and was given a MRI. This Dr referred him to Charlotte Sanes who is suppose to give him a shot in his back. However Dr Lynford Humphrey will not give it to him until he hear from you that it is okay for patient to stop taking aspirin 81mg  until after the procedure. Please call Dr Lynford Humphrey ([place]) to give him the verbal [phone]. If you have further questions please call patient [phone]. Pt also has left a copy of his test results that they have found. Hoping to get the shot tomorrow

## 2017-03-30 NOTE — Telephone Encounter (Signed)
Please contact the orthopedic clinic and I will be glad to speak with the orthopedic surgeon regarding this patient.

## 2017-04-11 ENCOUNTER — Telehealth: Payer: Self-pay | Admitting: Family Medicine

## 2017-04-11 NOTE — Telephone Encounter (Signed)
Dr Emmaline Kluver office returning your call pertaining to a verbal that they are requesting for patient to stop taking aspirin due to a procedure.  (P) [phone]

## 2017-04-11 NOTE — Telephone Encounter (Signed)
Please connect with Dr. Jonathon Resides office and I will be happy to talk to them when available

## 2017-04-13 ENCOUNTER — Other Ambulatory Visit: Payer: Self-pay | Admitting: Family Medicine

## 2017-04-13 DIAGNOSIS — I10 Essential (primary) hypertension: Secondary | ICD-10-CM

## 2017-04-14 DIAGNOSIS — M48062 Spinal stenosis, lumbar region with neurogenic claudication: Secondary | ICD-10-CM | POA: Diagnosis not present

## 2017-04-14 DIAGNOSIS — M545 Low back pain: Secondary | ICD-10-CM | POA: Diagnosis not present

## 2017-04-17 ENCOUNTER — Telehealth: Payer: Self-pay | Admitting: Family Medicine

## 2017-04-17 DIAGNOSIS — E875 Hyperkalemia: Secondary | ICD-10-CM

## 2017-04-17 NOTE — Telephone Encounter (Signed)
I had contacted the office twice last week and left a message for them to return my call. Never received a response.

## 2017-04-17 NOTE — Telephone Encounter (Signed)
LISINOPRIL IS NEEDING TO BE REFILLED AND HE IS ALMOST OUT AND [employer] SAYS THAT THE DR HAS NOT RESPONDED TO THEIR REQUEST TWICE. HE ORDERED IT THE FIRST TIME July 10TH. 2. ANOTHER THING THE PATIENT SAID IS THAT DR DAVID MUSANTE ( ORTHO [phone]) IS TRYING TO HAVE SURGERY OR SPINAL SHOT AND HE WILL NOT DO IT SINCE HE IS ON 81MG  ASPRIN 1 A DAY AND THEY NEED THE OK FOR HIM TO BE OFF IT.

## 2017-04-18 DIAGNOSIS — E875 Hyperkalemia: Secondary | ICD-10-CM | POA: Insufficient documentation

## 2017-04-18 NOTE — Telephone Encounter (Signed)
Dr. Manuella Ghazi refused the lisinopril on 03/21/17, but I don't know why Please ask patient to come by for a BMP; last creatinine was elevated and last K+ was elevated I'd like to see his kidney function and K+ before sending off a 90 day supply of medicine If he can do that today or tomorrow, we'll be able to help him In regards to the aspirin, that will need to wait for Dr. Manuella Ghazi

## 2017-04-18 NOTE — Telephone Encounter (Signed)
Just for clarification; he does not need an appointment, just the BMP Thank you

## 2017-04-18 NOTE — Telephone Encounter (Signed)
Left voicemail asking patient to call and schedule appointment this week if possible

## 2017-04-19 ENCOUNTER — Encounter: Payer: Self-pay | Admitting: Family Medicine

## 2017-04-19 ENCOUNTER — Ambulatory Visit (INDEPENDENT_AMBULATORY_CARE_PROVIDER_SITE_OTHER): Payer: Medicare HMO | Admitting: Family Medicine

## 2017-04-19 VITALS — BP 120/80 | HR 90 | Temp 98.3°F | Resp 16 | Ht 70.0 in | Wt 182.2 lb

## 2017-04-19 DIAGNOSIS — M48062 Spinal stenosis, lumbar region with neurogenic claudication: Secondary | ICD-10-CM | POA: Diagnosis not present

## 2017-04-19 DIAGNOSIS — E875 Hyperkalemia: Secondary | ICD-10-CM

## 2017-04-19 DIAGNOSIS — I10 Essential (primary) hypertension: Secondary | ICD-10-CM | POA: Diagnosis not present

## 2017-04-19 DIAGNOSIS — Z791 Long term (current) use of non-steroidal anti-inflammatories (NSAID): Secondary | ICD-10-CM | POA: Diagnosis not present

## 2017-04-19 NOTE — Progress Notes (Addendum)
Name: Tracy [name]   MRN: [id number]    DOB: [birth year]   Date:04/19/2017       Progress Note  Subjective  Chief Complaint  Chief Complaint  Patient presents with  . Hypertension    3 month follow up , medication refills    HPI  Pt presents for the following:  Lumbar Stenosis and Neurogenic Claudication: Patient saw Dr. Mack Guise with [facility], was given Meloxicam, and was referred to Dr. Lynford Humphrey at [facility] for an epidural steroid injection.  [name] office determined that patient could only have this injection if he stopped his daily 40m Aspirin, but pt needs approval to do this by his PCP.  Has taken 865mASA daily since 1980 s/p MI; no longer sees a cardiologist. Per MD notes from Dr. LaSanda Kleinwe will await Dr. ShTrena Platteturn before determining if aspirin therapy may be stopped.  -He also asks for a refill on his Meloxicam - Originally Rx'd by Dr. ShManuella GhaziDr. KrMack Guiseefilled last time, but he is no longer seeing him and is almost out.  Discussed most recent Kidney function (08/2016 - Elevated Cr), and the need to recheck prior to refilling this prescription. We will draw BMP today to check potassium and Kidney function and forward to PCP, Dr. ShManuella Ghazio determine refill status.  HTN: Pt has long-standing history of HTN. Per MD notes from Dr. LaSanda Kleinpatient needs to have BMP performed prior to refill.  Creatinine was elevated at last check in December 2017, and patient has been taking Meloxicam almost daily.  Discussed this with the patient in detail and he is very agreeable.  BP is below goal at 120/80 today.  He denies chest pain, shortness of breath, or swelling.  He is feeling very well today.  Patient Active Problem List   Diagnosis Date Noted  . Hyperkalemia 04/18/2017  . Medicare annual wellness visit, subsequent 11/28/2016  . Left hip pain 07/28/2016  . ALT (SGPT) level raised 07/13/2015  . Abdominal discomfort in right lower quadrant 07/13/2015  .  At risk for falling 07/13/2015  . Arthritis of hand, degenerative 07/13/2015  . Type 2 diabetes mellitus treated with insulin (HCTexanna10/31/2016  . Gout 07/13/2015  . Glaucoma 07/13/2015  . Diabetic neuropathy (HCAur[place]10/31/2016  . Hypertension 04/09/2015  . Dyslipidemia 04/09/2015  . Hypertriglyceridemia 04/09/2015  . Benign fibroma of prostate 04/09/2015  . Acid reflux 04/09/2015  . Cutaneous malignant melanoma (HC[facility] [place]07/28/2016  . Heart attack ([place]/1980    Social History  Substance Use Topics  . Smoking status: Former Smoker    Types: Cigarettes  . Smokeless tobacco: Never Used  . Alcohol use 0.0 oz/week     Comment: occasional     Current Outpatient Prescriptions:  .  ACCU-CHEK FASTCLIX LANCETS MISC, CHECK  BLOOD  SUGAR TWICE DAILY, Disp: 204 each, Rfl: 1 .  allopurinol (ZYLOPRIM) 100 MG tablet, Take 1 tablet (100 mg total) by mouth 2 (two) times daily., Disp: 180 tablet, Rfl: 0 .  amLODipine (NORVASC) 5 MG tablet, TAKE 1 TABLET EVERY DAY, Disp: 90 tablet, Rfl: 0 .  aspirin 81 MG tablet, Take 81 mg by mouth., Disp: , Rfl:  .  atorvastatin (LIPITOR) 10 MG tablet, TAKE 1 TABLET DAILY AT 6 PM., Disp: 90 tablet, Rfl: 0 .  Blood Glucose Monitoring Suppl (ACCU-CHEK NANO SMARTVIEW) W/DEVICE KIT, USE AS DIRECTED, Disp: 1 kit, Rfl: 0 .  dorzolamide (TRUSOPT) 2 % ophthalmic solution, , Disp: , Rfl:  .  doxazosin (CARDURA) 4 MG tablet, TAKE 1 TABLET AT BEDTIME, Disp: 90 tablet, Rfl: 0 .  finasteride (PROSCAR) 5 MG tablet, TAKE 1 TABLET AT BEDTIME, Disp: 90 tablet, Rfl: 0 .  glipiZIDE (GLUCOTROL) 10 MG tablet, TAKE 1 TABLET TWICE DAILY BEFORE MEALS, Disp: 180 tablet, Rfl: 0 .  glucose blood (COOL BLOOD GLUCOSE TEST STRIPS) test strip, Use as instructed, Disp: 100 each, Rfl: 12 .  Insulin Glargine (LANTUS SOLOSTAR) 100 UNIT/ML Solostar Pen, Inject 46 Units into the skin at bedtime. , Disp: , Rfl:  .  Insulin Pen Needle (B-D ULTRAFINE III SHORT PEN) 31G X 8 MM MISC, , Disp: , Rfl:  .   lisinopril (PRINIVIL,ZESTRIL) 40 MG tablet, TAKE 1 TABLET EVERY DAY, Disp: 90 tablet, Rfl: 3 .  meloxicam (MOBIC) 7.5 MG tablet, Take 1 tablet (7.5 mg total) by mouth daily. (Patient taking differently: Take 7.5 mg by mouth daily. ), Disp: 30 tablet, Rfl: 0 .  metFORMIN (GLUCOPHAGE) 1000 MG tablet, Take 1 tablet (1,000 mg total) by mouth 2 (two) times daily with a meal., Disp: 180 tablet, Rfl: 1 .  Multiple Vitamin (DAILY VITAMINS) tablet, Take by mouth., Disp: , Rfl:  .  NEEDLE, DISP, 14 G 14G X 1" MISC, , Disp: , Rfl:  .  ranitidine (ZANTAC) 150 MG capsule, Take 1 capsule (150 mg total) by mouth every evening. (Patient taking differently: Take 150 mg by mouth every evening. ), Disp: 90 capsule, Rfl: 0 .  timolol (TIMOPTIC) 0.5 % ophthalmic solution, Frequency:QD   Dosage:0.0     Instructions:  Note:Dose: 0.5 %, OS, Disp: , Rfl:   Allergies  Allergen Reactions  . Tetracyclines & Related Other (See Comments)    ROS  Constitutional: Negative for fever or weight change.  Respiratory: Negative for cough and shortness of breath.   Cardiovascular: Negative for chest pain or palpitations.  Gastrointestinal: Negative for abdominal pain, no bowel changes.  Musculoskeletal: Negative for gait problem or joint swelling. +Hip, buttock, and thigh pain Skin: Negative for rash.  Neurological: Negative for dizziness or headache.  No other specific complaints in a complete review of systems (except as listed in HPI above).  Objective  Vitals:   04/19/17 1325  BP: 120/80  Pulse: 90  Resp: 16  Temp: 98.3 F (36.8 C)  TempSrc: Oral  SpO2: 97%  Weight: 182 lb 3.2 oz (82.6 kg)  Height: 5' 10"  (1.778 m)   Body mass index is 26.14 kg/m.  Nursing Note and Vital Signs reviewed.  Physical Exam  Constitutional: Patient appears well-developed and well-nourished. No distress.  HEENT: head atraumatic, normocephalic Cardiovascular: Normal rate, regular rhythm, S1/S2 present.  No murmur or rub heard.  No BLE edema. Pulmonary/Chest: Effort normal and breath sounds clear. No respiratory distress or retractions. Psychiatric: Patient has a normal mood and affect. behavior is normal. Judgment and thought content normal. MSK: Decreased lumbar AROM, extremities have baseline AROM. Skin warm/dry.  No tenderness.  No results found for this or any previous visit (from the past 2160 hour(s)).   Assessment & Plan  1. Spinal stenosis of lumbar region with neurogenic claudication - Spoke with [name] office, patient must be off of Aspirin therapy for >6 days before procedure is performed.  Will defer to PCP, Dr. Manuella Ghazi to determine Aspirin therapy regimen. - BMP to be drawn today, will pass along to Dr. Manuella Ghazi to determine if Meloxicam appropriate to refill.  2. Essential hypertension -Patient will have BMP performed today while in the office. -We  will await these test results prior to refilling lisinopril.  Discussed red flags for elevated BP/stroke including headache, blurred vision, extremities weakness, confusion, and facial droop .- Basic Metabolic Panel (BMET) - *Of note, patient needs 90 day supply sent to his mail-order pharmacy, but will also need a 7 day supply sent to [facility] [address] to get him through until the mail order arrives*  3. Hyperkalemia - Basic Metabolic Panel (BMET)  4. NSAID long-term use - Basic Metabolic Panel (BMET)  I have reviewed this encounter including the documentation in this note and/or discussed this patient with the Johney Maine, FNP, NP-C. I am certifying that I agree with the content of this note as supervising physician.  Steele Sizer, MD [facility] 04/24/2017, 9:43 PM

## 2017-04-19 NOTE — Patient Instructions (Signed)
Please have your Metabolic Panel drawn today.  We will be in contact regarding your Meloxicam and Lisinopril after this result is back. We have placed a call to [facility] to determine what their Aspirin Protocol is, and we are awaiting a response back. I will notify your PCP, Dr. Manuella Ghazi, so that he is able to follow up with you.

## 2017-04-20 ENCOUNTER — Other Ambulatory Visit: Payer: Self-pay | Admitting: Family Medicine

## 2017-04-20 DIAGNOSIS — M25552 Pain in left hip: Secondary | ICD-10-CM

## 2017-04-20 DIAGNOSIS — I10 Essential (primary) hypertension: Secondary | ICD-10-CM

## 2017-04-20 LAB — BASIC METABOLIC PANEL
BUN: 21 mg/dL (ref 7–25)
CHLORIDE: 106 mmol/L (ref 98–110)
CO2: 21 mmol/L (ref 20–32)
CREATININE: 1.05 mg/dL (ref 0.70–1.11)
Calcium: 9 mg/dL (ref 8.6–10.3)
GLUCOSE: 204 mg/dL — AB (ref 65–99)
Potassium: 4.4 mmol/L (ref 3.5–5.3)
Sodium: 139 mmol/L (ref 135–146)

## 2017-04-20 MED ORDER — LISINOPRIL 40 MG PO TABS: 40.0000 mg | ORAL_TABLET | Freq: Every day | ORAL | 0 refills | Status: DC

## 2017-04-20 MED ORDER — LISINOPRIL 40 MG PO TABS: 40.0000 mg | ORAL_TABLET | Freq: Every day | ORAL | 1 refills | Status: DC

## 2017-04-20 MED ORDER — MELOXICAM 7.5 MG PO TABS: 7.5000 mg | ORAL_TABLET | Freq: Every day | ORAL | 0 refills | Status: DC | PRN

## 2017-04-25 ENCOUNTER — Other Ambulatory Visit: Payer: Self-pay | Admitting: Family Medicine

## 2017-04-25 DIAGNOSIS — E1121 Type 2 diabetes mellitus with diabetic nephropathy: Secondary | ICD-10-CM

## 2017-04-25 DIAGNOSIS — N400 Enlarged prostate without lower urinary tract symptoms: Secondary | ICD-10-CM

## 2017-04-25 DIAGNOSIS — M109 Gout, unspecified: Secondary | ICD-10-CM

## 2017-04-25 DIAGNOSIS — I10 Essential (primary) hypertension: Secondary | ICD-10-CM

## 2017-04-25 DIAGNOSIS — Z794 Long term (current) use of insulin: Secondary | ICD-10-CM

## 2017-04-25 DIAGNOSIS — E119 Type 2 diabetes mellitus without complications: Secondary | ICD-10-CM

## 2017-04-27 NOTE — Telephone Encounter (Signed)
Spoke with patient and discussed stopping aspirin prior to a lower back injection. It is okay for patient to stop aspirin for 6 days prior to the procedure and resume after 24 hours.

## 2017-05-03 ENCOUNTER — Ambulatory Visit (INDEPENDENT_AMBULATORY_CARE_PROVIDER_SITE_OTHER): Payer: Medicare HMO | Admitting: Family Medicine

## 2017-05-03 ENCOUNTER — Encounter: Payer: Self-pay | Admitting: Family Medicine

## 2017-05-03 ENCOUNTER — Telehealth: Payer: Self-pay | Admitting: Family Medicine

## 2017-05-03 VITALS — BP 124/82 | HR 84 | Temp 97.7°F | Resp 16 | Ht 70.0 in | Wt 181.8 lb

## 2017-05-03 DIAGNOSIS — N400 Enlarged prostate without lower urinary tract symptoms: Secondary | ICD-10-CM

## 2017-05-03 DIAGNOSIS — E119 Type 2 diabetes mellitus without complications: Secondary | ICD-10-CM

## 2017-05-03 DIAGNOSIS — I10 Essential (primary) hypertension: Secondary | ICD-10-CM | POA: Diagnosis not present

## 2017-05-03 DIAGNOSIS — E785 Hyperlipidemia, unspecified: Secondary | ICD-10-CM

## 2017-05-03 DIAGNOSIS — M48062 Spinal stenosis, lumbar region with neurogenic claudication: Secondary | ICD-10-CM | POA: Insufficient documentation

## 2017-05-03 DIAGNOSIS — M109 Gout, unspecified: Secondary | ICD-10-CM

## 2017-05-03 DIAGNOSIS — Z794 Long term (current) use of insulin: Secondary | ICD-10-CM

## 2017-05-03 DIAGNOSIS — K219 Gastro-esophageal reflux disease without esophagitis: Secondary | ICD-10-CM | POA: Diagnosis not present

## 2017-05-03 LAB — POCT GLYCOSYLATED HEMOGLOBIN (HGB A1C): HEMOGLOBIN A1C: 6.9

## 2017-05-03 MED ORDER — GLIPIZIDE 10 MG PO TABS: ORAL_TABLET | ORAL | 1 refills | Status: DC

## 2017-05-03 MED ORDER — METFORMIN HCL 1000 MG PO TABS: 1000.0000 mg | ORAL_TABLET | Freq: Two times a day (BID) | ORAL | 1 refills | Status: DC

## 2017-05-03 MED ORDER — FINASTERIDE 5 MG PO TABS: 5.0000 mg | ORAL_TABLET | Freq: Every day | ORAL | 1 refills | Status: DC

## 2017-05-03 MED ORDER — AMLODIPINE BESYLATE 5 MG PO TABS: 5.0000 mg | ORAL_TABLET | Freq: Every day | ORAL | 1 refills | Status: DC

## 2017-05-03 MED ORDER — RANITIDINE HCL 150 MG PO CAPS: 150.0000 mg | ORAL_CAPSULE | Freq: Every day | ORAL | 0 refills | Status: DC | PRN

## 2017-05-03 MED ORDER — ALLOPURINOL 100 MG PO TABS: 100.0000 mg | ORAL_TABLET | Freq: Two times a day (BID) | ORAL | 1 refills | Status: DC

## 2017-05-03 MED ORDER — ATORVASTATIN CALCIUM 10 MG PO TABS: ORAL_TABLET | ORAL | 1 refills | Status: DC

## 2017-05-03 MED ORDER — DOXAZOSIN MESYLATE 4 MG PO TABS
4.0000 mg | ORAL_TABLET | Freq: Every day | ORAL | 1 refills | Status: DC
Start: 2017-05-03 — End: 2017-11-20

## 2017-05-03 NOTE — Progress Notes (Addendum)
Name: Tracy [name]   MRN: [id number]    DOB: 1930/01/08   Date:05/03/2017       Progress Note  Subjective  Chief Complaint  Chief Complaint  Patient presents with  . Follow-up    2 weeks  . Medication Refill    HPI  Lumbar Stenosis and Neurogenic Claudication: Patient saw Dr. Mack Guise with [facility], and was referred to Dr. Lynford Humphrey at [facility] for an epidural steroid injection.  [name] office determined that patient could only have this injection if he stopped his daily '81mg'$  Aspirin for 6 days prior, but pt needed approval to do this by his PCP.  Pt spoke with Dr. Manuella Ghazi regarding this and he was advised on the increased risk of heart attack and stroke from stopping for 6 days especially due to patient's hsitory of prior MI and current T2DM, and he verbalizes his willingness to take this risk to proceed with the procedure.  Has taken '81mg'$  ASA daily since 1980 s/p MI; no longer sees a cardiologist.  We will notify [name] Office so that they may proceed.  HTN: Pt has long-standing history of HTN. Per MD notes from Dr. Sanda Klein, patient needs to have BMP performed prior to refill.  Creatinine was elevated in December 2017, back down to normal August 2018.  BP is below goal  today.  He denies chest pain, shortness of breath, or swelling.  He is feeling very well today.  Taking lisinopril, amlodipine  Diabetes:  A1C today is 6.9% (Down from 7.2% in March 2018) Was seen by an endocrinologist for a long time, but was released and told to return only as needed. His disease course has been stable. No blurred vision, no chest pain, no fatigue, no polydipsia and no polyuria; endorses occasional polyphagia. No history of CVA, and denies numbness/tingling in feet or hands. Risk factors for coronary artery disease include diabetes mellitus, dyslipidemia and male sex. Current diabetic treatment includes glipizide, metformin, and 50units lantus QHS.  He follows the diabetic  diet most days, but sometimes has some fried foods.  Average fasting home BG's 115-120, highest fasting is 170, lowest fasting is 90 (happens maybe once a month).  He participates in exercise three times a week - golfing or at the fitness center. On an ACE-I, Statin, and Aspirin.   Hyperlipidemia: Has had hyperlipidemia for many years; has history of MI 08/27/1979. Takes aspirin daily, on atorvastatin '10mg'$ , takes fish oil OTC 2 capsules daily.  Recent lipid tests were reviewed and show elevated triglycerides, but otherwise look excellent. Pertinent negatives include no chest pain, leg pain, myalgias or shortness of breath. Risk factors for coronary artery disease include diabetes mellitus and dyslipidemia.   Benign fibroma of Prostate: Has had for many years, last PSA was March 2018 and was 0.3. IPSS Questionnaire (AUA-7): Over the past month.   1)  How often have you had a sensation of not emptying your bladder completely after you finish urinating?  3 - About half the time  2)  How often have you had to urinate again less than two hours after you finished urinating? 3 - About half the time  3)  How often have you found you stopped and started again several times when you urinated?  1 - Less than 1 time in 5  4) How difficult have you found it to postpone urination?  1 - Less than 1 time in 5  5) How often have you had a weak  urinary stream?  0 - Not at all  6) How often have you had to push or strain to begin urination?  0 - Never  7) How many times did you most typically get up to urinate from the time you went to bed until the time you got up in the morning?  0 - None  Total score:  0-7 mildly symptomatic   8-19 moderately symptomatic   20-35 severely symptomatic  Score: 8; Moderately symptomatic - patient states this is much improved than before he was started on medications and does not want to proceed with any medication changes at this time.  GERD: Has GERD only when he eats a heavy meal  or drinks vodka or scotch. Will take Zantac PRN for this and it treats his symptoms with good relief. Otherwise symptoms have been well controlled.  Gout: Takes allopurinol BID. Has had gout 5-6 times in the past, last episode was at least 3 years go. Normally gets it in the heel. Denies any joint redness or swelling today.  Patient Active Problem List   Diagnosis Date Noted  . Spinal stenosis of lumbar region with neurogenic claudication 05/03/2017  . Type 2 diabetes mellitus with diabetic nephropathy, with long-term current use of insulin ([place]) 05/03/2017  . Hyperkalemia 04/18/2017  . Medicare annual wellness visit, subsequent 11/28/2016  . Left hip pain 07/28/2016  . ALT (SGPT) level raised 07/13/2015  . Abdominal discomfort in right lower quadrant 07/13/2015  . At risk for falling 07/13/2015  . Arthritis of hand, degenerative 07/13/2015  . Gout 07/13/2015  . Glaucoma 07/13/2015  . Diabetic neuropathy ([place]) 07/13/2015  . Hypertension 04/09/2015  . Dyslipidemia 04/09/2015  . Hypertriglyceridemia 04/09/2015  . Benign fibroma of prostate 04/09/2015  . Acid reflux 04/09/2015  . Cutaneous malignant melanoma ([place]) 04/09/2015  . Heart attack ([place]) 08/27/1979    Past Surgical History:  Procedure Laterality Date  . CIRCUMCISION    . MELANOMA EXCISION  07/2011  . TONSILLECTOMY      Family History  Problem Relation Age of Onset  . Heart disease Mother   . Diabetes Mother        type 2  . Heart disease Brother     Social History   Social History  . Marital status: Married    Spouse name: N/A  . Number of children: N/A  . Years of education: N/A   Occupational History  . Not on file.   Social History Main Topics  . Smoking status: Former Smoker    Types: Cigarettes  . Smokeless tobacco: Never Used  . Alcohol use 0.0 oz/week     Comment: occasional  . Drug use: No  . Sexual activity: Not on file   Other Topics Concern  . Not on file   Social History Narrative  .  No narrative on file     Current Outpatient Prescriptions:  .  ACCU-CHEK FASTCLIX LANCETS MISC, CHECK  BLOOD  SUGAR TWICE DAILY, Disp: 204 each, Rfl: 1 .  allopurinol (ZYLOPRIM) 100 MG tablet, Take 1 tablet (100 mg total) by mouth 2 (two) times daily., Disp: 180 tablet, Rfl: 1 .  amLODipine (NORVASC) 5 MG tablet, Take 1 tablet (5 mg total) by mouth daily., Disp: 90 tablet, Rfl: 1 .  aspirin 81 MG tablet, Take 81 mg by mouth., Disp: , Rfl:  .  atorvastatin (LIPITOR) 10 MG tablet, TAKE 1 TABLET DAILY AT 6 PM., Disp: 90 tablet, Rfl: 1 .  Blood Glucose Monitoring Suppl (ACCU-CHEK  NANO SMARTVIEW) W/DEVICE KIT, USE AS DIRECTED, Disp: 1 kit, Rfl: 0 .  dorzolamide (TRUSOPT) 2 % ophthalmic solution, , Disp: , Rfl:  .  doxazosin (CARDURA) 4 MG tablet, Take 1 tablet (4 mg total) by mouth at bedtime., Disp: 90 tablet, Rfl: 1 .  finasteride (PROSCAR) 5 MG tablet, Take 1 tablet (5 mg total) by mouth at bedtime., Disp: 90 tablet, Rfl: 1 .  glipiZIDE (GLUCOTROL) 10 MG tablet, TAKE 1 TABLET TWICE DAILY BEFORE MEALS, Disp: 180 tablet, Rfl: 1 .  glucose blood (COOL BLOOD GLUCOSE TEST STRIPS) test strip, Use as instructed, Disp: 100 each, Rfl: 12 .  Insulin Glargine (LANTUS SOLOSTAR) 100 UNIT/ML Solostar Pen, Inject 46 Units into the skin at bedtime. , Disp: , Rfl:  .  Insulin Pen Needle (B-D ULTRAFINE III SHORT PEN) 31G X 8 MM MISC, , Disp: , Rfl:  .  lisinopril (PRINIVIL,ZESTRIL) 40 MG tablet, Take 1 tablet (40 mg total) by mouth daily., Disp: 90 tablet, Rfl: 1 .  meloxicam (MOBIC) 7.5 MG tablet, Take 1 tablet (7.5 mg total) by mouth daily as needed., Disp: 30 tablet, Rfl: 0 .  metFORMIN (GLUCOPHAGE) 1000 MG tablet, Take 1 tablet (1,000 mg total) by mouth 2 (two) times daily with a meal., Disp: 180 tablet, Rfl: 1 .  Multiple Vitamin (DAILY VITAMINS) tablet, Take by mouth., Disp: , Rfl:  .  NEEDLE, DISP, 14 G 14G X 1" MISC, , Disp: , Rfl:  .  ranitidine (ZANTAC) 150 MG capsule, Take 1 capsule (150 mg total) by  mouth daily as needed., Disp: 10 capsule, Rfl: 0 .  timolol (TIMOPTIC) 0.5 % ophthalmic solution, Frequency:QD   Dosage:0.0     Instructions:  Note:Dose: 0.5 %, OS, Disp: , Rfl:  .  Cholecalciferol (VITAMIN D) 2000 units tablet, Take 2,000 Units by mouth daily., Disp: , Rfl:  .  Cobalamine Combinations (B12 FOLATE PO), Take 1 tablet by mouth daily., Disp: , Rfl:  .  Multiple Vitamins-Minerals (CENTRUM SILVER 50+MEN) TABS, Take 1 tablet by mouth daily., Disp: , Rfl:   Allergies  Allergen Reactions  . Tetracyclines & Related Other (See Comments)     ROS  Constitutional: Negative for fever or weight change.  Respiratory: Negative for cough and shortness of breath.   Cardiovascular: Negative for chest pain or palpitations.  Gastrointestinal: Negative for abdominal pain, no bowel changes.  Musculoskeletal: Negative for gait problem or joint swelling.  Skin: Negative for rash.  Neurological: Negative for dizziness or headache.  No other specific complaints in a complete review of systems (except as listed in HPI above).  Objective  Vitals:   05/03/17 0753  BP: 124/82  Pulse: 84  Resp: 16  Temp: 97.7 F (36.5 C)  TempSrc: Oral  SpO2: 95%  Weight: 181 lb 12.8 oz (82.5 kg)  Height: '5\' 10"'$  (1.778 m)   Body mass index is 26.09 kg/m.  Physical Exam Constitutional: Patient appears well-developed and well-nourished. No distress.  HEENT: head atraumatic, normocephalic, pupils equal and reactive to light, neck supple, throat within normal limits Cardiovascular: Normal rate, regular rhythm and normal heart sounds.  No murmur heard. No BLE edema. Pulmonary/Chest: Effort normal and breath sounds normal. No respiratory distress. Abdominal: Soft.  There is no tenderness. Psychiatric: Patient has a normal mood and affect. behavior is normal. Judgment and thought content normal.  Recent Results (from the past 2160 hour(s))  Basic Metabolic Panel (BMET)     Status: Abnormal   Collection  Time: 04/19/17  2:30 PM  Result Value Ref Range   Sodium 139 135 - 146 mmol/L   Potassium 4.4 3.5 - 5.3 mmol/L   Chloride 106 98 - 110 mmol/L   CO2 21 20 - 32 mmol/L    Comment: ** Please note change in reference range(s). **      Glucose, Bld 204 (H) 65 - 99 mg/dL   BUN 21 7 - 25 mg/dL   Creat 1.05 0.70 - 1.11 mg/dL    Comment:   For patients > or = 81 years of age: The upper reference limit for Creatinine is approximately 13% higher for people identified as African-American.      Calcium 9.0 8.6 - 10.3 mg/dL  POCT glycosylated hemoglobin (Hb A1C)     Status: None   Collection Time: 05/03/17  1:50 PM  Result Value Ref Range   Hemoglobin A1C 6.9     Diabetic Foot Exam: Diabetic Foot Exam - Simple   Simple Foot Form Diabetic Foot exam was performed with the following findings:  Yes 05/03/2017  8:38 AM  Visual Inspection No deformities, no ulcerations, no other skin breakdown bilaterally:  Yes Sensation Testing Intact to touch and monofilament testing bilaterally:  Yes Pulse Check Posterior Tibialis and Dorsalis pulse intact bilaterally:  Yes Comments    PHQ2/9: Depression screen [facility] 2/9 12/08/2016 11/28/2016 01/21/2016 01/08/2016 10/13/2015  Decreased Interest 0 0 0 0 0  Down, Depressed, Hopeless 0 0 0 0 0  PHQ - 2 Score 0 0 0 0 0   Fall Risk: Fall Risk  12/08/2016 11/28/2016 01/21/2016 01/08/2016 10/13/2015  Falls in the past year? No No No No No   Assessment & Plan  1. Essential hypertension  - amLODipine (NORVASC) 5 MG tablet; Take 1 tablet (5 mg total) by mouth daily.  Dispense: 90 tablet; Refill: 1  2. Dyslipidemia  - atorvastatin (LIPITOR) 10 MG tablet; TAKE 1 TABLET DAILY AT 6 PM.  Dispense: 90 tablet; Refill: 1  3. Type 2 diabetes mellitus with diabetic nephropathy, with long-term current use of insulin (HCC)  - metFORMIN (GLUCOPHAGE) 1000 MG tablet; Take 1 tablet (1,000 mg total) by mouth 2 (two) times daily with a meal.  Dispense: 180 tablet; Refill: 1 -  glipiZIDE (GLUCOTROL) 10 MG tablet; TAKE 1 TABLET TWICE DAILY BEFORE MEALS  Dispense: 180 tablet; Refill: 1 - POCT glycosylated hemoglobin (Hb A1C)  4. Benign fibroma of prostate  - finasteride (PROSCAR) 5 MG tablet; Take 1 tablet (5 mg total) by mouth at bedtime.  Dispense: 90 tablet; Refill: 1  5. Gastroesophageal reflux disease, esophagitis presence not specified  - ranitidine (ZANTAC) 150 MG capsule; Take 1 capsule (150 mg total) by mouth daily as needed.  Dispense: 10 capsule; Refill: 0  6. Gout of right foot, unspecified cause, unspecified chronicity  - allopurinol (ZYLOPRIM) 100 MG tablet; Take 1 tablet (100 mg total) by mouth 2 (two) times daily.  Dispense: 180 tablet; Refill: 1  7. Spinal stenosis of lumbar region with neurogenic claudication May go off of Aspirin x6 days leading up to injection with [facility]. Patient verbalizes understanding of increasing his risk for cardiac and stroke event while off of this medication.   -Reviewed Health Maintenance: Foot Exam done.  I have reviewed this encounter including the documentation in this note and/or discussed this patient with the Johney Maine, FNP, NP-C. I am certifying that I agree with the content of this note as supervising physician.  Steele Sizer, MD [facility] 05/03/2017, 5:01  PM

## 2017-05-03 NOTE — Telephone Encounter (Signed)
Please call [name] office with [facility] to let them know that he is cleared to stop aspirin x6 days per PCP Dr. Manuella Ghazi. He would like to schedule his injection for sometime before 05/17/2017. Thanks!

## 2017-05-03 NOTE — Telephone Encounter (Signed)
Spoke to [facility], Order faxed over to [phone]. They will schedule appointment

## 2017-05-10 DIAGNOSIS — M5416 Radiculopathy, lumbar region: Secondary | ICD-10-CM | POA: Diagnosis not present

## 2017-05-24 DIAGNOSIS — M5416 Radiculopathy, lumbar region: Secondary | ICD-10-CM | POA: Diagnosis not present

## 2017-06-06 DIAGNOSIS — M5416 Radiculopathy, lumbar region: Secondary | ICD-10-CM | POA: Diagnosis not present

## 2017-06-06 DIAGNOSIS — E119 Type 2 diabetes mellitus without complications: Secondary | ICD-10-CM | POA: Diagnosis not present

## 2017-06-21 DIAGNOSIS — M5416 Radiculopathy, lumbar region: Secondary | ICD-10-CM | POA: Diagnosis not present

## 2017-06-21 DIAGNOSIS — E119 Type 2 diabetes mellitus without complications: Secondary | ICD-10-CM | POA: Diagnosis not present

## 2017-06-22 DIAGNOSIS — M5416 Radiculopathy, lumbar region: Secondary | ICD-10-CM | POA: Diagnosis not present

## 2017-06-23 ENCOUNTER — Encounter: Payer: Self-pay | Admitting: Family Medicine

## 2017-06-27 ENCOUNTER — Ambulatory Visit (INDEPENDENT_AMBULATORY_CARE_PROVIDER_SITE_OTHER): Payer: Medicare HMO | Admitting: Family Medicine

## 2017-06-27 ENCOUNTER — Ambulatory Visit
Admission: RE | Admit: 2017-06-27 | Discharge: 2017-06-27 | Disposition: A | Discharge status: Home / Self Care | Payer: Medicare HMO | Source: Ambulatory Visit | Attending: Family Medicine | Admitting: Family Medicine

## 2017-06-27 ENCOUNTER — Encounter: Payer: Self-pay | Admitting: Family Medicine

## 2017-06-27 VITALS — BP 130/64 | HR 85 | Temp 98.1°F | Resp 16 | Ht 70.0 in | Wt 176.4 lb

## 2017-06-27 DIAGNOSIS — Z87891 Personal history of nicotine dependence: Secondary | ICD-10-CM | POA: Diagnosis not present

## 2017-06-27 DIAGNOSIS — Z01818 Encounter for other preprocedural examination: Secondary | ICD-10-CM | POA: Insufficient documentation

## 2017-06-27 DIAGNOSIS — I252 Old myocardial infarction: Secondary | ICD-10-CM

## 2017-06-27 DIAGNOSIS — Z23 Encounter for immunization: Secondary | ICD-10-CM | POA: Diagnosis not present

## 2017-06-27 LAB — POCT INR: INR: 1

## 2017-06-27 LAB — POCT GLYCOSYLATED HEMOGLOBIN (HGB A1C): Hemoglobin A1C: 5.4

## 2017-06-27 NOTE — Progress Notes (Signed)
Name: Tracy [name]   MRN: [id number]    DOB: 1930/03/28   Date:06/27/2017       Progress Note  Subjective  Chief Complaint  Chief Complaint  Patient presents with  . surgical clearance    HPI  Pt. Presents for preoperative clearance for an elective lumabr laminectomy, to be performed by Dr. Artelia [name] for lumbar spinal stenosis.  He has had no previous abnormal bleeding episodes and takes Aspirin 81 mg every day He has history of MI in 1980, has seen Dr. Darrold [name] in the past but regular cardiology follow up. He has well controlled Diabetes. He has no history of adverse reaction to anesthesia.    Past Medical History:  Diagnosis Date  . Arthritis   . Diabetes mellitus without complication (HCC)   . GERD (gastroesophageal reflux disease)   . Hypertension     Past Surgical History:  Procedure Laterality Date  . CIRCUMCISION    . MELANOMA EXCISION  07/2011  . TONSILLECTOMY      Family History  Problem Relation Age of Onset  . Heart disease Mother   . Diabetes Mother        type 2  . Heart disease Brother     Social History   Social History  . Marital status: Married    Spouse name: N/A  . Number of children: N/A  . Years of education: N/A   Occupational History  . Not on file.   Social History Main Topics  . Smoking status: Former Smoker    Types: Cigarettes  . Smokeless tobacco: Never Used  . Alcohol use 0.0 oz/week     Comment: occasional  . Drug use: No  . Sexual activity: Not on file   Other Topics Concern  . Not on file   Social History Narrative  . No narrative on file     Current Outpatient Prescriptions:  .  ACCU-CHEK FASTCLIX LANCETS MISC, CHECK  BLOOD  SUGAR TWICE DAILY, Disp: 204 each, Rfl: 1 .  allopurinol (ZYLOPRIM) 100 MG tablet, Take 1 tablet (100 mg total) by mouth 2 (two) times daily., Disp: 180 tablet, Rfl: 1 .  amLODipine (NORVASC) 5 MG tablet, Take 1 tablet (5 mg total) by mouth daily., Disp: 90 tablet, Rfl: 1 .   aspirin 81 MG tablet, Take 81 mg by mouth., Disp: , Rfl:  .  atorvastatin (LIPITOR) 10 MG tablet, TAKE 1 TABLET DAILY AT 6 PM., Disp: 90 tablet, Rfl: 1 .  Blood Glucose Monitoring Suppl (ACCU-CHEK NANO SMARTVIEW) W/DEVICE KIT, USE AS DIRECTED, Disp: 1 kit, Rfl: 0 .  Cholecalciferol (VITAMIN D) 2000 units tablet, Take 2,000 Units by mouth daily., Disp: , Rfl:  .  Cobalamine Combinations (B12 FOLATE PO), Take 1 tablet by mouth daily., Disp: , Rfl:  .  finasteride (PROSCAR) 5 MG tablet, Take 1 tablet (5 mg total) by mouth at bedtime., Disp: 90 tablet, Rfl: 1 .  glipiZIDE (GLUCOTROL) 10 MG tablet, TAKE 1 TABLET TWICE DAILY BEFORE MEALS, Disp: 180 tablet, Rfl: 1 .  glucose blood (COOL BLOOD GLUCOSE TEST STRIPS) test strip, Use as instructed, Disp: 100 each, Rfl: 12 .  Insulin Glargine (LANTUS SOLOSTAR) 100 UNIT/ML Solostar Pen, Inject 50 Units into the skin at bedtime. , Disp: , Rfl:  .  Insulin Pen Needle (B-D ULTRAFINE III SHORT PEN) 31G X 8 MM MISC, , Disp: , Rfl:  .  lisinopril (PRINIVIL,ZESTRIL) 40 MG tablet, Take 1 tablet (40 mg total) by mouth daily., Disp: 90 tablet,  Rfl: 1 .  meloxicam (MOBIC) 7.5 MG tablet, Take 1 tablet (7.5 mg total) by mouth daily as needed., Disp: 30 tablet, Rfl: 0 .  metFORMIN (GLUCOPHAGE) 1000 MG tablet, Take 1 tablet (1,000 mg total) by mouth 2 (two) times daily with a meal., Disp: 180 tablet, Rfl: 1 .  Multiple Vitamin (DAILY VITAMINS) tablet, Take by mouth., Disp: , Rfl:  .  ranitidine (ZANTAC) 150 MG capsule, Take 1 capsule (150 mg total) by mouth daily as needed., Disp: 10 capsule, Rfl: 0 .  timolol (TIMOPTIC) 0.5 % ophthalmic solution, Frequency:QD   Dosage:0.0     Instructions:  Note:Dose: 0.5 %, OS, Disp: , Rfl:  .  dorzolamide (TRUSOPT) 2 % ophthalmic solution, , Disp: , Rfl:  .  doxazosin (CARDURA) 4 MG tablet, Take 1 tablet (4 mg total) by mouth at bedtime., Disp: 90 tablet, Rfl: 1 .  Multiple Vitamins-Minerals (CENTRUM SILVER 50+MEN) TABS, Take 1 tablet by  mouth daily., Disp: , Rfl:  .  NEEDLE, DISP, 14 G 14G X 1" MISC, , Disp: , Rfl:  .  vitamin B-12 (CYANOCOBALAMIN) 500 MCG tablet, Take 1 tablet by mouth daily., Disp: , Rfl:   Allergies  Allergen Reactions  . Tetracyclines & Related Other (See Comments)     Review of Systems  Constitutional: Negative for chills, fever and malaise/fatigue.  HENT: Negative for congestion and ear pain.   Respiratory: Negative for cough, sputum production and shortness of breath.   Cardiovascular: Negative for chest pain, palpitations and leg swelling.  Gastrointestinal: Negative for abdominal pain, nausea and vomiting.  Genitourinary: Negative for dysuria and hematuria.  Musculoskeletal: Negative for back pain.    Objective  Vitals:   06/27/17 1154  BP: 130/64  Pulse: 85  Resp: 16  Temp: 98.1 F (36.7 C)  TempSrc: Oral  SpO2: 98%  Weight: 176 lb 6.4 oz (80 kg)  Height: 5\' 10"  (1.778 m)    Physical Exam  Constitutional: He is oriented to person, place, and time and well-developed, well-nourished, and in no distress.  HENT:  Head: Normocephalic and atraumatic.  Cardiovascular: Normal rate, regular rhythm and normal heart sounds.   No murmur heard. Pulmonary/Chest: Effort normal and breath sounds normal. He has no wheezes.  Abdominal: Soft. Bowel sounds are normal. There is no tenderness.  Musculoskeletal: He exhibits no edema.  Neurological: He is alert and oriented to person, place, and time.  Psychiatric: Mood, memory, affect and judgment normal.  Nursing note and vitals reviewed.      Assessment & Plan  1. Preoperative clearance INR is normal at 1.0, point-of-care A1c is 5.4%, consistent with well-controlled diabetes. Obtain pertinent lab work as requested by orthopedist. - EKG 12-Lead - CBC with Differential/Platelet - BASIC METABOLIC PANEL WITH GFR - Hepatic function panel - POCT HgB A1C - Urinalysis - POCT INR - DG Chest 2 View; Future - Ambulatory referral to  Cardiology  2. History of MI (myocardial infarction) EKG otherwise normal but given his history of previous MI, will refer to cardiology - Ambulatory referral to Cardiology  3. Needs flu shot  - Flu vaccine HIGH DOSE PF (Fluzone High dose)  Tracy Bultman Asad [facility] 06/27/2017 12:14 PM

## 2017-06-28 LAB — HEPATIC FUNCTION PANEL
AG Ratio: 1.9 (calc) (ref 1.0–2.5)
ALKALINE PHOSPHATASE (APISO): 38 U/L — AB (ref 40–115)
ALT: 42 U/L (ref 9–46)
AST: 27 U/L (ref 10–35)
Albumin: 4.3 g/dL (ref 3.6–5.1)
BILIRUBIN DIRECT: 0.1 mg/dL (ref 0.0–0.2)
BILIRUBIN TOTAL: 0.6 mg/dL (ref 0.2–1.2)
Globulin: 2.3 g/dL (calc) (ref 1.9–3.7)
Indirect Bilirubin: 0.5 mg/dL (calc) (ref 0.2–1.2)
Total Protein: 6.6 g/dL (ref 6.1–8.1)

## 2017-06-28 LAB — CBC WITH DIFFERENTIAL/PLATELET
BASOS ABS: 49 {cells}/uL (ref 0–200)
Basophils Relative: 0.8 %
EOS PCT: 3.3 %
Eosinophils Absolute: 201 cells/uL (ref 15–500)
HEMATOCRIT: 36.8 % — AB (ref 38.5–50.0)
Hemoglobin: 12.8 g/dL — ABNORMAL LOW (ref 13.2–17.1)
LYMPHS ABS: 2202 {cells}/uL (ref 850–3900)
MCH: 33.1 pg — ABNORMAL HIGH (ref 27.0–33.0)
MCHC: 34.8 g/dL (ref 32.0–36.0)
MCV: 95.1 fL (ref 80.0–100.0)
MONOS PCT: 10.8 %
MPV: 11.6 fL (ref 7.5–12.5)
NEUTROS PCT: 49 %
Neutro Abs: 2989 cells/uL (ref 1500–7800)
PLATELETS: 149 10*3/uL (ref 140–400)
RBC: 3.87 10*6/uL — AB (ref 4.20–5.80)
RDW: 13.1 % (ref 11.0–15.0)
TOTAL LYMPHOCYTE: 36.1 %
WBC mixed population: 659 cells/uL (ref 200–950)
WBC: 6.1 10*3/uL (ref 3.8–10.8)

## 2017-06-28 LAB — BASIC METABOLIC PANEL WITH GFR
BUN: 17 mg/dL (ref 7–25)
CALCIUM: 9.5 mg/dL (ref 8.6–10.3)
CHLORIDE: 104 mmol/L (ref 98–110)
CO2: 25 mmol/L (ref 20–32)
Creat: 0.94 mg/dL (ref 0.70–1.11)
GFR, EST NON AFRICAN AMERICAN: 74 mL/min/{1.73_m2} (ref >=60)
GFR, Est African American: 86 mL/min/{1.73_m2} (ref >=60)
Glucose, Bld: 169 mg/dL — ABNORMAL HIGH (ref 65–139)
POTASSIUM: 4.6 mmol/L (ref 3.5–5.3)
SODIUM: 137 mmol/L (ref 135–146)

## 2017-06-28 LAB — URINALYSIS
BILIRUBIN URINE: NEGATIVE
HGB URINE DIPSTICK: NEGATIVE
KETONES UR: NEGATIVE
Leukocytes, UA: NEGATIVE
Nitrite: NEGATIVE
PROTEIN: NEGATIVE
Specific Gravity, Urine: 1.013 (ref 1.001–1.03)
pH: 5.5 (ref 5.0–8.0)

## 2017-07-03 ENCOUNTER — Other Ambulatory Visit: Payer: Self-pay

## 2017-07-03 DIAGNOSIS — H401132 Primary open-angle glaucoma, bilateral, moderate stage: Secondary | ICD-10-CM | POA: Diagnosis not present

## 2017-07-03 MED ORDER — INSULIN PEN NEEDLE 31G X 8 MM MISC: 0 refills | Status: DC

## 2017-07-03 NOTE — Telephone Encounter (Signed)
Pt need a new RX sent.

## 2017-07-06 ENCOUNTER — Telehealth: Payer: Self-pay | Admitting: Family Medicine

## 2017-07-06 NOTE — Telephone Encounter (Signed)
Copied from [place]. Topic: Quick Communication - See Telephone Encounter >> Jul 06, 2017  9:45 AM Corie Chiquito, [place] wrote: CRM for notification. See Telephone encounter for:  07/06/17. Patient is requesting a copy of his EKG to be sent to his cardiologist Dr.Pairseats. Patient has a 9:30 appointment with him in the morning

## 2017-07-07 DIAGNOSIS — I214 Non-ST elevation (NSTEMI) myocardial infarction: Secondary | ICD-10-CM | POA: Diagnosis not present

## 2017-07-07 DIAGNOSIS — Z01810 Encounter for preprocedural cardiovascular examination: Secondary | ICD-10-CM | POA: Diagnosis not present

## 2017-07-07 DIAGNOSIS — I10 Essential (primary) hypertension: Secondary | ICD-10-CM | POA: Diagnosis not present

## 2017-07-07 NOTE — Telephone Encounter (Signed)
EKG performed on 06/27/17 has been faxed to [facility] Cardiologist attention to Dr. Saralyn Pilar on 07/07/17 @ 8:36am.

## 2017-07-10 DIAGNOSIS — H401132 Primary open-angle glaucoma, bilateral, moderate stage: Secondary | ICD-10-CM | POA: Diagnosis not present

## 2017-07-10 DIAGNOSIS — H02059 Trichiasis without entropian unspecified eye, unspecified eyelid: Secondary | ICD-10-CM | POA: Diagnosis not present

## 2017-07-11 DIAGNOSIS — Z08 Encounter for follow-up examination after completed treatment for malignant neoplasm: Secondary | ICD-10-CM | POA: Diagnosis not present

## 2017-07-11 DIAGNOSIS — L570 Actinic keratosis: Secondary | ICD-10-CM | POA: Diagnosis not present

## 2017-07-11 DIAGNOSIS — D485 Neoplasm of uncertain behavior of skin: Secondary | ICD-10-CM | POA: Diagnosis not present

## 2017-07-11 DIAGNOSIS — Z85828 Personal history of other malignant neoplasm of skin: Secondary | ICD-10-CM | POA: Diagnosis not present

## 2017-07-11 DIAGNOSIS — Z85820 Personal history of malignant melanoma of skin: Secondary | ICD-10-CM | POA: Diagnosis not present

## 2017-07-11 DIAGNOSIS — B079 Viral wart, unspecified: Secondary | ICD-10-CM | POA: Diagnosis not present

## 2017-07-11 DIAGNOSIS — X32XXXA Exposure to sunlight, initial encounter: Secondary | ICD-10-CM | POA: Diagnosis not present

## 2017-07-13 ENCOUNTER — Telehealth: Payer: Self-pay

## 2017-07-13 NOTE — Telephone Encounter (Signed)
Pt informed

## 2017-07-13 NOTE — Telephone Encounter (Signed)
Copied from [place] [id number]. Topic: Inquiry >> Jul 13, 2017 10:38 AM Pricilla Handler wrote: Reason for CRM: Patient called requesting a copy of his lab results. Patient wants to pick up a physical copy from the office to take to another doctor. Please print a copy of patient's lab results and call him once they are ready for pickup. Patient needs ASAP. Thank You!!!

## 2017-07-13 NOTE — Telephone Encounter (Signed)
The requested information has been printed and placed upfront for pick up.

## 2017-07-14 ENCOUNTER — Telehealth: Payer: Self-pay | Admitting: Family Medicine

## 2017-07-14 NOTE — Telephone Encounter (Signed)
Copied from [place] [id number]. Topic: Inquiry >> Jul 14, 2017 12:20 PM Malena Catholic I, [place] wrote: Reason for CRM: Ort Call for a pt they need EKG any Office note and, Lab  Her name is Tracy [name] her Beryle Lathe is [phone] her num is [phone] Ext [phone]

## 2017-07-14 NOTE — Telephone Encounter (Signed)
The requested information has been previously sent by Practice Manager, Radene Knee, on 06/30/17.

## 2017-07-18 DIAGNOSIS — I10 Essential (primary) hypertension: Secondary | ICD-10-CM | POA: Diagnosis not present

## 2017-07-18 DIAGNOSIS — M48062 Spinal stenosis, lumbar region with neurogenic claudication: Secondary | ICD-10-CM | POA: Diagnosis not present

## 2017-07-18 DIAGNOSIS — Z6825 Body mass index (BMI) 25.0-25.9, adult: Secondary | ICD-10-CM | POA: Diagnosis not present

## 2017-07-19 ENCOUNTER — Other Ambulatory Visit: Payer: Self-pay | Admitting: Neurosurgery

## 2017-07-24 DIAGNOSIS — Z01810 Encounter for preprocedural cardiovascular examination: Secondary | ICD-10-CM | POA: Diagnosis not present

## 2017-07-24 DIAGNOSIS — I214 Non-ST elevation (NSTEMI) myocardial infarction: Secondary | ICD-10-CM | POA: Diagnosis not present

## 2017-07-25 DIAGNOSIS — M545 Low back pain: Secondary | ICD-10-CM | POA: Diagnosis not present

## 2017-07-26 ENCOUNTER — Other Ambulatory Visit: Payer: Self-pay | Admitting: Neurosurgery

## 2017-07-28 DIAGNOSIS — I10 Essential (primary) hypertension: Secondary | ICD-10-CM | POA: Diagnosis not present

## 2017-07-28 DIAGNOSIS — Z01810 Encounter for preprocedural cardiovascular examination: Secondary | ICD-10-CM | POA: Diagnosis not present

## 2017-07-28 DIAGNOSIS — I214 Non-ST elevation (NSTEMI) myocardial infarction: Secondary | ICD-10-CM | POA: Diagnosis not present

## 2017-07-31 DIAGNOSIS — M545 Low back pain: Secondary | ICD-10-CM | POA: Diagnosis not present

## 2017-08-01 ENCOUNTER — Ambulatory Visit: Payer: Medicare HMO | Admitting: Family Medicine

## 2017-08-07 ENCOUNTER — Ambulatory Visit: Payer: Medicare HMO | Admitting: Family Medicine

## 2017-08-07 ENCOUNTER — Encounter: Payer: Self-pay | Admitting: Family Medicine

## 2017-08-07 VITALS — BP 124/76 | HR 93 | Temp 98.0°F | Resp 16 | Wt 181.3 lb

## 2017-08-07 DIAGNOSIS — I10 Essential (primary) hypertension: Secondary | ICD-10-CM | POA: Diagnosis not present

## 2017-08-07 MED ORDER — LISINOPRIL 40 MG PO TABS: 40.0000 mg | ORAL_TABLET | Freq: Every day | ORAL | 0 refills | Status: DC

## 2017-08-07 NOTE — Progress Notes (Signed)
Name: Tracy [name]   MRN: [id number]    DOB: 1930-02-25   Date:08/07/2017       Progress Note  Subjective  Chief Complaint  Chief Complaint  Patient presents with  . Diabetes    3 months    Hypertension  This is a chronic problem. The problem is unchanged. The problem is controlled. Pertinent negatives include no blurred vision, chest pain, headaches, palpitations, shortness of breath or sweats. Past treatments include ACE inhibitors. There is no history of kidney disease, CAD/MI or CVA.     Past Medical History:  Diagnosis Date  . Arthritis   . Diabetes mellitus without complication (HCC)   . GERD (gastroesophageal reflux disease)   . Hypertension     Past Surgical History:  Procedure Laterality Date  . CIRCUMCISION    . MELANOMA EXCISION  07/2011  . TONSILLECTOMY      Family History  Problem Relation Age of Onset  . Heart disease Mother   . Diabetes Mother        type 2  . Heart disease Brother     Social History   Socioeconomic History  . Marital status: Married    Spouse name: Not on file  . Number of children: Not on file  . Years of education: Not on file  . Highest education level: Not on file  Social Needs  . Financial resource strain: Not on file  . Food insecurity - worry: Not on file  . Food insecurity - inability: Not on file  . Transportation needs - medical: Not on file  . Transportation needs - non-medical: Not on file  Occupational History  . Not on file  Tobacco Use  . Smoking status: Former Smoker    Types: Cigarettes  . Smokeless tobacco: Never Used  Substance and Sexual Activity  . Alcohol use: Yes    Alcohol/week: 0.0 oz    Comment: occasional  . Drug use: No  . Sexual activity: Not on file  Other Topics Concern  . Not on file  Social History Narrative  . Not on file     Current Outpatient Medications:  .  ACCU-CHEK FASTCLIX LANCETS MISC, CHECK  BLOOD  SUGAR TWICE DAILY, Disp: 204 each, Rfl: 1 .  allopurinol  (ZYLOPRIM) 100 MG tablet, Take 1 tablet (100 mg total) by mouth 2 (two) times daily., Disp: 180 tablet, Rfl: 1 .  amLODipine (NORVASC) 5 MG tablet, Take 1 tablet (5 mg total) by mouth daily., Disp: 90 tablet, Rfl: 1 .  aspirin 81 MG tablet, Take 81 mg by mouth., Disp: , Rfl:  .  atorvastatin (LIPITOR) 10 MG tablet, TAKE 1 TABLET DAILY AT 6 PM., Disp: 90 tablet, Rfl: 1 .  Blood Glucose Monitoring Suppl (ACCU-CHEK NANO SMARTVIEW) W/DEVICE KIT, USE AS DIRECTED, Disp: 1 kit, Rfl: 0 .  Cholecalciferol (VITAMIN D) 2000 units tablet, Take 2,000 Units by mouth daily., Disp: , Rfl:  .  Cobalamine Combinations (B12 FOLATE PO), Take 1 tablet by mouth daily., Disp: , Rfl:  .  dorzolamide (TRUSOPT) 2 % ophthalmic solution, , Disp: , Rfl:  .  doxazosin (CARDURA) 4 MG tablet, Take 1 tablet (4 mg total) by mouth at bedtime., Disp: 90 tablet, Rfl: 1 .  finasteride (PROSCAR) 5 MG tablet, Take 1 tablet (5 mg total) by mouth at bedtime., Disp: 90 tablet, Rfl: 1 .  glipiZIDE (GLUCOTROL) 10 MG tablet, TAKE 1 TABLET TWICE DAILY BEFORE MEALS, Disp: 180 tablet, Rfl: 1 .  glucose blood (COOL  BLOOD GLUCOSE TEST STRIPS) test strip, Use as instructed, Disp: 100 each, Rfl: 12 .  Insulin Glargine (LANTUS SOLOSTAR) 100 UNIT/ML Solostar Pen, Inject 50 Units into the skin at bedtime. , Disp: , Rfl:  .  Insulin Pen Needle (B-D ULTRAFINE III SHORT PEN) 31G X 8 MM MISC, Use as directed with Insulin pen., Disp: 100 each, Rfl: 0 .  lisinopril (PRINIVIL,ZESTRIL) 40 MG tablet, Take 1 tablet (40 mg total) by mouth daily., Disp: 90 tablet, Rfl: 1 .  meloxicam (MOBIC) 7.5 MG tablet, Take 1 tablet (7.5 mg total) by mouth daily as needed., Disp: 30 tablet, Rfl: 0 .  metFORMIN (GLUCOPHAGE) 1000 MG tablet, Take 1 tablet (1,000 mg total) by mouth 2 (two) times daily with a meal., Disp: 180 tablet, Rfl: 1 .  Multiple Vitamin (DAILY VITAMINS) tablet, Take by mouth., Disp: , Rfl:  .  Multiple Vitamins-Minerals (CENTRUM SILVER 50+MEN) TABS, Take 1  tablet by mouth daily., Disp: , Rfl:  .  NEEDLE, DISP, 14 G 14G X 1" MISC, , Disp: , Rfl:  .  ranitidine (ZANTAC) 150 MG capsule, Take 1 capsule (150 mg total) by mouth daily as needed., Disp: 10 capsule, Rfl: 0 .  timolol (TIMOPTIC) 0.5 % ophthalmic solution, Frequency:QD   Dosage:0.0     Instructions:  Note:Dose: 0.5 %, OS, Disp: , Rfl:  .  vitamin B-12 (CYANOCOBALAMIN) 500 MCG tablet, Take 1 tablet by mouth daily., Disp: , Rfl:   Allergies  Allergen Reactions  . Tetracyclines & Related Other (See Comments)     Review of Systems  Eyes: Negative for blurred vision.  Respiratory: Negative for shortness of breath.   Cardiovascular: Negative for chest pain and palpitations.  Neurological: Negative for headaches.    Objective  Vitals:   08/07/17 1522  BP: 124/76  Pulse: 93  Resp: 16  Temp: 98 F (36.7 C)  TempSrc: Oral  SpO2: 96%  Weight: 181 lb 4.8 oz (82.2 kg)    Physical Exam  Constitutional: He is oriented to person, place, and time and well-developed, well-nourished, and in no distress.  HENT:  Head: Normocephalic and atraumatic.  Cardiovascular: Normal rate, regular rhythm and normal heart sounds.  No murmur heard. Pulmonary/Chest: Effort normal and breath sounds normal. He has no wheezes.  Neurological: He is alert and oriented to person, place, and time.  Psychiatric: Mood, memory, affect and judgment normal.  Nursing note and vitals reviewed.       Assessment & Plan  1. Essential hypertension BP stable on present antihypertensive treatment  - lisinopril (PRINIVIL,ZESTRIL) 40 MG tablet; Take 1 tablet (40 mg total) by mouth daily.  Dispense: 90 tablet; Refill: 0   Haleem Hanner [facility]ad [facility] 08/07/2017 3:27 PM

## 2017-08-10 ENCOUNTER — Other Ambulatory Visit: Payer: Self-pay | Admitting: Family Medicine

## 2017-08-10 DIAGNOSIS — K219 Gastro-esophageal reflux disease without esophagitis: Secondary | ICD-10-CM

## 2017-08-10 NOTE — Telephone Encounter (Signed)
Copied from [place] [id number]. Topic: General - Other >> Aug 10, 2017  3:04 PM Yvette Rack wrote: Reason for CRM: med refill on Zantac 150 mg to [facility] mail service

## 2017-08-11 MED ORDER — RANITIDINE HCL 150 MG PO CAPS: 150.0000 mg | ORAL_CAPSULE | Freq: Every day | ORAL | 0 refills | Status: DC | PRN

## 2017-08-11 NOTE — Telephone Encounter (Signed)
Prescription refill. Thanks.

## 2017-08-22 DIAGNOSIS — M4317 Spondylolisthesis, lumbosacral region: Secondary | ICD-10-CM | POA: Diagnosis not present

## 2017-08-29 DIAGNOSIS — H3530 Unspecified macular degeneration: Secondary | ICD-10-CM | POA: Diagnosis not present

## 2017-08-29 DIAGNOSIS — Z85820 Personal history of malignant melanoma of skin: Secondary | ICD-10-CM | POA: Diagnosis not present

## 2017-08-29 DIAGNOSIS — Z7984 Long term (current) use of oral hypoglycemic drugs: Secondary | ICD-10-CM | POA: Diagnosis not present

## 2017-08-29 DIAGNOSIS — Z794 Long term (current) use of insulin: Secondary | ICD-10-CM | POA: Diagnosis not present

## 2017-08-29 DIAGNOSIS — E119 Type 2 diabetes mellitus without complications: Secondary | ICD-10-CM | POA: Diagnosis not present

## 2017-08-29 DIAGNOSIS — I10 Essential (primary) hypertension: Secondary | ICD-10-CM | POA: Diagnosis not present

## 2017-08-29 DIAGNOSIS — I252 Old myocardial infarction: Secondary | ICD-10-CM | POA: Diagnosis not present

## 2017-08-29 DIAGNOSIS — E781 Pure hyperglyceridemia: Secondary | ICD-10-CM | POA: Diagnosis not present

## 2017-08-29 DIAGNOSIS — I2510 Atherosclerotic heart disease of native coronary artery without angina pectoris: Secondary | ICD-10-CM | POA: Diagnosis not present

## 2017-08-29 DIAGNOSIS — K219 Gastro-esophageal reflux disease without esophagitis: Secondary | ICD-10-CM | POA: Diagnosis not present

## 2017-09-06 NOTE — Pre-Procedure Instructions (Signed)
ANTRON SETH  09/06/2017      [place] [place] [id number],  [address] [address] [place] [address] Phone: [phone] Fax: [phone]  [place] [address] Phone: [phone] Fax: [facility] - [place], [place] [address] Phone: [phone] Fax: [phone]    Your procedure is scheduled on Wednesday January 2.  Report to [facility] Admitting at 6:30 A.M.  Call this number if you have problems the morning of surgery:  [phone]   Remember:  Do not eat food or drink liquids after midnight.  Take these medicines the morning of surgery with A SIP OF WATER:   Allopurinol (zyloprim) Amlodipine (norvasc) Ranitidine (zantac) Eye drops  DO NOT TAKE Glipizide (Glucotrol) or Metformin (Glucphage) the day of surgery  TAKE HALF DOSE of insulin Glargine (Lantus) insulin the night before surgery. (25 units)  7 days prior to surgery STOP taking any meloxicam (mobic), Aleve, Naproxen, Ibuprofen, Motrin, Advil, Goody's, BC's, all herbal medications, fish oil, and all vitamins  **FOLLOW your surgeon's instructions on stopping Aspirin. If no instructions were given, please call surgeon's office**     How to Manage Your Diabetes Before and After Surgery  Why is it important to control my blood sugar before and after surgery? . Improving blood sugar levels before and after surgery helps healing and can limit problems. . A way of improving blood sugar control is eating a healthy diet by: o  Eating less sugar and carbohydrates o  Increasing activity/exercise o  Talking with your doctor about reaching your blood sugar goals . High blood sugars (greater than 180 mg/dL) can raise your risk of infections and slow your recovery, so you will  need to focus on controlling your diabetes during the weeks before surgery. . Make sure that the doctor who takes care of your diabetes knows about your planned surgery including the date and location.  How do I manage my blood sugar before surgery? . Check your blood sugar at least 4 times a day, starting 2 days before surgery, to make sure that the level is not too high or low. o Check your blood sugar the morning of your surgery when you wake up and every 2 hours until you get to the Short Stay unit. . If your blood sugar is less than 70 mg/dL, you will need to treat for low blood sugar: o Do not take insulin. o Treat a low blood sugar (less than 70 mg/dL) with  cup of clear juice (cranberry or apple), 4 glucose tablets, OR glucose gel. Recheck blood sugar in 15 minutes after treatment (to make sure it is greater than 70 mg/dL). If your blood sugar is not greater than 70 mg/dL on recheck, call [phone] o  for further instructions. . Report your blood sugar to the short stay nurse when you get to Short Stay.  . If you are admitted to the hospital after surgery: o Your blood sugar will be checked by the staff and you will probably be given insulin after surgery (instead of oral diabetes medicines) to make sure you have good blood sugar levels. o The goal for blood sugar control after surgery is 80-180 mg/dL.              Do not wear jewelry, make-up  or nail polish.  Do not wear lotions, powders, or perfumes, or deodorant.  Do not shave 48 hours prior to surgery.  Men may shave face and neck.  Do not bring valuables to the hospital.  [facility] is not responsible for any belongings or valuables.  Contacts, dentures or bridgework may not be worn into surgery.  Leave your suitcase in the car.  After surgery it may be brought to your room.  For patients admitted to the hospital, discharge time will be determined by your treatment team.  Patients discharged the day of surgery  will not be allowed to drive home.    Special instructions:    [place]- Preparing For Surgery  Before surgery, you can play an important role. Because skin is not sterile, your skin needs to be as free of germs as possible. You can reduce the number of germs on your skin by washing with CHG (chlorahexidine gluconate) Soap before surgery.  CHG is an antiseptic cleaner which kills germs and bonds with the skin to continue killing germs even after washing.  Please do not use if you have an allergy to CHG or antibacterial soaps. If your skin becomes reddened/irritated stop using the CHG.  Do not shave (including legs and underarms) for at least 48 hours prior to first CHG shower. It is OK to shave your face.  Please follow these instructions carefully.   1. Shower the NIGHT BEFORE SURGERY and the MORNING OF SURGERY with CHG.   2. If you chose to wash your hair, wash your hair first as usual with your normal shampoo.  3. After you shampoo, rinse your hair and body thoroughly to remove the shampoo.  4. Use CHG as you would any other liquid soap. You can apply CHG directly to the skin and wash gently with a scrungie or a clean washcloth.   5. Apply the CHG Soap to your body ONLY FROM THE NECK DOWN.  Do not use on open wounds or open sores. Avoid contact with your eyes, ears, mouth and genitals (private parts). Wash Face and genitals (private parts)  with your normal soap.  6. Wash thoroughly, paying special attention to the area where your surgery will be performed.  7. Thoroughly rinse your body with warm water from the neck down.  8. DO NOT shower/wash with your normal soap after using and rinsing off the CHG Soap.  9. Pat yourself dry with a CLEAN TOWEL.  10. Wear CLEAN PAJAMAS to bed the night before surgery, wear comfortable clothes the morning of surgery  11. Place CLEAN SHEETS on your bed the night of your first shower and DO NOT SLEEP WITH PETS.    Day of Surgery: Do not  apply any deodorants/lotions. Please wear clean clothes to the hospital/surgery center.      Please read over the following fact sheets that you were given. Coughing and Deep Breathing, MRSA Information and Surgical Site Infection Prevention

## 2017-09-07 ENCOUNTER — Encounter (HOSPITAL_COMMUNITY)
Admission: RE | Admit: 2017-09-07 | Discharge: 2017-09-07 | Disposition: A | Discharge status: Home / Self Care | Payer: Medicare HMO | Source: Ambulatory Visit | Attending: Neurosurgery | Admitting: Neurosurgery

## 2017-09-07 ENCOUNTER — Other Ambulatory Visit: Payer: Self-pay

## 2017-09-07 ENCOUNTER — Encounter (HOSPITAL_COMMUNITY): Payer: Self-pay

## 2017-09-07 DIAGNOSIS — I252 Old myocardial infarction: Secondary | ICD-10-CM | POA: Insufficient documentation

## 2017-09-07 DIAGNOSIS — I10 Essential (primary) hypertension: Secondary | ICD-10-CM | POA: Insufficient documentation

## 2017-09-07 DIAGNOSIS — Z7982 Long term (current) use of aspirin: Secondary | ICD-10-CM | POA: Diagnosis not present

## 2017-09-07 DIAGNOSIS — Z7984 Long term (current) use of oral hypoglycemic drugs: Secondary | ICD-10-CM | POA: Insufficient documentation

## 2017-09-07 DIAGNOSIS — Z87891 Personal history of nicotine dependence: Secondary | ICD-10-CM | POA: Insufficient documentation

## 2017-09-07 DIAGNOSIS — E119 Type 2 diabetes mellitus without complications: Secondary | ICD-10-CM | POA: Insufficient documentation

## 2017-09-07 DIAGNOSIS — K219 Gastro-esophageal reflux disease without esophagitis: Secondary | ICD-10-CM | POA: Insufficient documentation

## 2017-09-07 DIAGNOSIS — Z79899 Other long term (current) drug therapy: Secondary | ICD-10-CM | POA: Diagnosis not present

## 2017-09-07 DIAGNOSIS — Z01812 Encounter for preprocedural laboratory examination: Secondary | ICD-10-CM | POA: Diagnosis not present

## 2017-09-07 HISTORY — DX: Malignant (primary) neoplasm, unspecified: C80.1

## 2017-09-07 HISTORY — DX: Acute myocardial infarction, unspecified: I21.9

## 2017-09-07 LAB — CBC
HCT: 39.2 % (ref 39.0–52.0)
Hemoglobin: 13.7 g/dL (ref 13.0–17.0)
MCH: 33.7 pg (ref 26.0–34.0)
MCHC: 34.9 g/dL (ref 30.0–36.0)
MCV: 96.6 fL (ref 78.0–100.0)
PLATELETS: 135 10*3/uL — AB (ref 150–400)
RBC: 4.06 MIL/uL — AB (ref 4.22–5.81)
RDW: 13.4 % (ref 11.5–15.5)
WBC: 6.7 10*3/uL (ref 4.0–10.5)

## 2017-09-07 LAB — BASIC METABOLIC PANEL
ANION GAP: 8 (ref 5–15)
BUN: 19 mg/dL (ref 6–20)
CALCIUM: 9.2 mg/dL (ref 8.9–10.3)
CO2: 21 mmol/L — ABNORMAL LOW (ref 22–32)
Chloride: 107 mmol/L (ref 101–111)
Creatinine, Ser: 0.98 mg/dL (ref 0.61–1.24)
GLUCOSE: 222 mg/dL — AB (ref 65–99)
POTASSIUM: 4.6 mmol/L (ref 3.5–5.1)
SODIUM: 136 mmol/L (ref 135–145)

## 2017-09-07 LAB — SURGICAL PCR SCREEN
MRSA, PCR: NEGATIVE
Staphylococcus aureus: NEGATIVE

## 2017-09-07 LAB — GLUCOSE, CAPILLARY: GLUCOSE-CAPILLARY: 262 mg/dL — AB (ref 65–99)

## 2017-09-07 NOTE — Progress Notes (Signed)
NOTIFIED Tracy [name] OF GLUCOSE 265 ON ARRIVAL, PATIENT HAD EATEN CHEESE TOAST THIS AM.  ENCOURAGED PATIENT TO WATCH DIET PRIOR TO SURGERY AND BE SURE TO TAKE DIABETES MEDS AS INSTRUCTED.  CARDIOLOGIST DR. Royal Kunia. [phone] [name]

## 2017-09-07 NOTE — Progress Notes (Signed)
Anesthesia Chart Review:  Pt is an 83 year old male scheduled for bilateral L3-4, left L4-5, and left L5-S1 laminectomy/foraminotomy on 09/13/2016 with [name], M.D.  - PCP is Keith Rake, MD. Last office visit 08/07/17 - Cardiologist is Isaias Cowman, MD who cleared pt for surgery at last office visit 07/28/17 (notes in care everywhere)   PMH includes: MI (1980), HTN, DM, GERD. Former smoker. BMI 26.  Medications include: Amlodipine, ASA 81 mg, Lipitor, doxazosin, glipizide, Lantus, lisinopril, metformin, Zantac  BP (!) 152/82   Pulse 65   Temp 36.6 C (Oral)   Resp 18   Ht 5\' 10"  (1.778 m)   Wt 179 lb 10.8 oz (81.5 kg)   SpO2 97%   BMI 25.78 kg/m   Preoperative labs reviewed.   - Glucose 222. HbA1c was 5.4 on 06/27/17  CXR 06/27/17: There is no acute cardiopulmonary abnormality.  EKG 06/27/17: Sinus rhythm. Short PR interval. LAFB.  Nuclear stress test 07/25/17 (care everywhere):  1.Negative ETT 2.Mild reduced left ventricular function 3.Inferior wall hypokinesis 4.Mild inferior scar without significant ischemia  If no changes, I anticipate pt can proceed with surgery as scheduled.   Willeen Cass, FNP-BC [facility] Short Stay Surgical Center/Anesthesiology Phone: [phone] 09/07/2017 4:07 PM

## 2017-09-08 ENCOUNTER — Telehealth: Payer: Self-pay | Admitting: Family Medicine

## 2017-09-08 NOTE — Telephone Encounter (Signed)
Copied from [place] [id number]. Topic: Quick Communication - See Telephone Encounter >> Sep 08, 2017  4:54 PM Corie Chiquito, [place] wrote: CRM for notification. See Telephone encounter for: Nevin Bloodgood called from [place] because the patient still needs his refill on his Ranitidine 150 Mg. If someone could give them a call back at [phone]  09/08/17.

## 2017-09-11 NOTE — Telephone Encounter (Signed)
Spoke Insurance claims handler from [facility] and he stated the last Rx written for this was last year. I saw that Dr. Manuella Ghazi reorder it and sent it to [facility] on 08/11/2017 but Elta [place] states they did not receive it; therefore did a verbal. Also [facility] [place] states capsule usually are not covered therefore changed it to tabs instead. I will change it on the pt med list to match.

## 2017-09-13 ENCOUNTER — Encounter (HOSPITAL_COMMUNITY)
Admission: RE | Disposition: A | Discharge status: Home / Self Care | Payer: Self-pay | Source: Ambulatory Visit | Attending: Neurosurgery

## 2017-09-13 ENCOUNTER — Inpatient Hospital Stay (HOSPITAL_COMMUNITY): Payer: Medicare HMO

## 2017-09-13 ENCOUNTER — Inpatient Hospital Stay (HOSPITAL_COMMUNITY)
Admission: RE | Admit: 2017-09-13 | Discharge: 2017-09-14 | DRG: 520 | Disposition: A | Discharge status: Home / Self Care | Payer: Medicare HMO | Source: Ambulatory Visit | Attending: Neurosurgery | Admitting: Neurosurgery

## 2017-09-13 ENCOUNTER — Other Ambulatory Visit: Payer: Self-pay

## 2017-09-13 ENCOUNTER — Encounter (HOSPITAL_COMMUNITY): Payer: Self-pay

## 2017-09-13 ENCOUNTER — Inpatient Hospital Stay (HOSPITAL_COMMUNITY): Payer: Medicare HMO | Admitting: Emergency Medicine

## 2017-09-13 ENCOUNTER — Inpatient Hospital Stay (HOSPITAL_COMMUNITY): Payer: Medicare HMO | Admitting: Certified Registered Nurse Anesthetist

## 2017-09-13 DIAGNOSIS — Z8249 Family history of ischemic heart disease and other diseases of the circulatory system: Secondary | ICD-10-CM | POA: Diagnosis not present

## 2017-09-13 DIAGNOSIS — M5418 Radiculopathy, sacral and sacrococcygeal region: Secondary | ICD-10-CM | POA: Diagnosis not present

## 2017-09-13 DIAGNOSIS — E119 Type 2 diabetes mellitus without complications: Secondary | ICD-10-CM | POA: Diagnosis not present

## 2017-09-13 DIAGNOSIS — M4807 Spinal stenosis, lumbosacral region: Secondary | ICD-10-CM | POA: Diagnosis present

## 2017-09-13 DIAGNOSIS — M7138 Other bursal cyst, other site: Secondary | ICD-10-CM | POA: Diagnosis present

## 2017-09-13 DIAGNOSIS — I252 Old myocardial infarction: Secondary | ICD-10-CM

## 2017-09-13 DIAGNOSIS — I10 Essential (primary) hypertension: Secondary | ICD-10-CM | POA: Diagnosis present

## 2017-09-13 DIAGNOSIS — Z794 Long term (current) use of insulin: Secondary | ICD-10-CM | POA: Diagnosis not present

## 2017-09-13 DIAGNOSIS — Z87891 Personal history of nicotine dependence: Secondary | ICD-10-CM | POA: Diagnosis not present

## 2017-09-13 DIAGNOSIS — K219 Gastro-esophageal reflux disease without esophagitis: Secondary | ICD-10-CM | POA: Diagnosis not present

## 2017-09-13 DIAGNOSIS — Z419 Encounter for procedure for purposes other than remedying health state, unspecified: Secondary | ICD-10-CM

## 2017-09-13 DIAGNOSIS — Z981 Arthrodesis status: Secondary | ICD-10-CM | POA: Diagnosis not present

## 2017-09-13 DIAGNOSIS — Z85820 Personal history of malignant melanoma of skin: Secondary | ICD-10-CM | POA: Diagnosis not present

## 2017-09-13 DIAGNOSIS — Z833 Family history of diabetes mellitus: Secondary | ICD-10-CM

## 2017-09-13 DIAGNOSIS — M48061 Spinal stenosis, lumbar region without neurogenic claudication: Principal | ICD-10-CM | POA: Diagnosis present

## 2017-09-13 DIAGNOSIS — Z7982 Long term (current) use of aspirin: Secondary | ICD-10-CM | POA: Diagnosis not present

## 2017-09-13 DIAGNOSIS — M5417 Radiculopathy, lumbosacral region: Secondary | ICD-10-CM | POA: Diagnosis not present

## 2017-09-13 DIAGNOSIS — M5416 Radiculopathy, lumbar region: Secondary | ICD-10-CM | POA: Diagnosis not present

## 2017-09-13 HISTORY — PX: LUMBAR LAMINECTOMY/DECOMPRESSION MICRODISCECTOMY: SHX5026

## 2017-09-13 LAB — GLUCOSE, CAPILLARY
GLUCOSE-CAPILLARY: 114 mg/dL — AB (ref 65–99)
GLUCOSE-CAPILLARY: 122 mg/dL — AB (ref 65–99)
GLUCOSE-CAPILLARY: 322 mg/dL — AB (ref 65–99)
GLUCOSE-CAPILLARY: 273 mg/dL — AB (ref 65–99)
Glucose-Capillary: 179 mg/dL — ABNORMAL HIGH (ref 65–99)

## 2017-09-13 SURGERY — LUMBAR LAMINECTOMY/DECOMPRESSION MICRODISCECTOMY 3 LEVELS
Anesthesia: General | Site: Back

## 2017-09-13 MED ORDER — PANTOPRAZOLE SODIUM 40 MG PO TBEC
40.0000 mg | DELAYED_RELEASE_TABLET | Freq: Every day | ORAL | Status: DC
  Administered 2017-09-13: 40 mg via ORAL
  Filled 2017-09-13: qty 1

## 2017-09-13 MED ORDER — EPHEDRINE SULFATE-NACL 50-0.9 MG/10ML-% IV SOSY
PREFILLED_SYRINGE | INTRAVENOUS | Status: DC | PRN
  Administered 2017-09-13: 10 mg via INTRAVENOUS
  Administered 2017-09-13 (×2): 5 mg via INTRAVENOUS

## 2017-09-13 MED ORDER — ONDANSETRON HCL 4 MG/2ML IJ SOLN
4.0000 mg | Freq: Once | INTRAMUSCULAR | Status: DC | PRN
Start: 2017-09-13 — End: 2017-09-13

## 2017-09-13 MED ORDER — PROPOFOL 10 MG/ML IV BOLUS
INTRAVENOUS | Status: DC | PRN
  Administered 2017-09-13: 130 mg via INTRAVENOUS

## 2017-09-13 MED ORDER — CEFAZOLIN SODIUM-DEXTROSE 2-4 GM/100ML-% IV SOLN
2.0000 g | Freq: Three times a day (TID) | INTRAVENOUS | Status: AC
  Administered 2017-09-13 – 2017-09-14 (×2): 2 g via INTRAVENOUS
  Filled 2017-09-13 (×2): qty 100

## 2017-09-13 MED ORDER — OMEGA-3-ACID ETHYL ESTERS 1 G PO CAPS: 1.0000 g | ORAL_CAPSULE | Freq: Every day | ORAL | Status: DC

## 2017-09-13 MED ORDER — 0.9 % SODIUM CHLORIDE (POUR BTL) OPTIME
TOPICAL | Status: DC | PRN
  Administered 2017-09-13: 1000 mL

## 2017-09-13 MED ORDER — ACETAMINOPHEN 325 MG PO TABS: 650.0000 mg | ORAL_TABLET | ORAL | Status: DC | PRN

## 2017-09-13 MED ORDER — ROCURONIUM BROMIDE 10 MG/ML (PF) SYRINGE
PREFILLED_SYRINGE | INTRAVENOUS | Status: AC
  Filled 2017-09-13: qty 5

## 2017-09-13 MED ORDER — BUPIVACAINE HCL (PF) 0.25 % IJ SOLN
INTRAMUSCULAR | Status: DC | PRN
  Administered 2017-09-13: 10 mL
  Administered 2017-09-13: 5 mL

## 2017-09-13 MED ORDER — INSULIN ASPART 100 UNIT/ML ~~LOC~~ SOLN
0.0000 [IU] | Freq: Every day | SUBCUTANEOUS | Status: DC
  Administered 2017-09-13: 3 [IU] via SUBCUTANEOUS

## 2017-09-13 MED ORDER — ONDANSETRON HCL 4 MG/2ML IJ SOLN
INTRAMUSCULAR | Status: DC | PRN
  Administered 2017-09-13: 4 mg via INTRAVENOUS

## 2017-09-13 MED ORDER — BUPIVACAINE HCL (PF) 0.25 % IJ SOLN
INTRAMUSCULAR | Status: AC
  Filled 2017-09-13: qty 30

## 2017-09-13 MED ORDER — PHENYLEPHRINE HCL 10 MG/ML IJ SOLN
INTRAVENOUS | Status: DC | PRN
  Administered 2017-09-13: 25 ug/min via INTRAVENOUS

## 2017-09-13 MED ORDER — THROMBIN (RECOMBINANT) 5000 UNITS EX SOLR
CUTANEOUS | Status: DC | PRN
  Administered 2017-09-13 (×2): 5000 [IU] via TOPICAL

## 2017-09-13 MED ORDER — MENTHOL 3 MG MT LOZG: 1.0000 | LOZENGE | OROMUCOSAL | Status: DC | PRN

## 2017-09-13 MED ORDER — LISINOPRIL 20 MG PO TABS
40.0000 mg | ORAL_TABLET | Freq: Every day | ORAL | Status: DC
  Administered 2017-09-13 – 2017-09-14 (×2): 40 mg via ORAL
  Filled 2017-09-13 (×2): qty 2

## 2017-09-13 MED ORDER — CYCLOBENZAPRINE HCL 10 MG PO TABS
10.0000 mg | ORAL_TABLET | Freq: Three times a day (TID) | ORAL | Status: DC | PRN
  Administered 2017-09-13 – 2017-09-14 (×3): 10 mg via ORAL
  Filled 2017-09-13 (×2): qty 1

## 2017-09-13 MED ORDER — ONDANSETRON HCL 4 MG PO TABS: 4.0000 mg | ORAL_TABLET | Freq: Four times a day (QID) | ORAL | Status: DC | PRN

## 2017-09-13 MED ORDER — OXYCODONE HCL 5 MG PO TABS
10.0000 mg | ORAL_TABLET | ORAL | Status: DC | PRN
  Administered 2017-09-13 – 2017-09-14 (×3): 10 mg via ORAL
  Filled 2017-09-13 (×2): qty 2

## 2017-09-13 MED ORDER — SODIUM CHLORIDE 0.9 % IR SOLN
Status: DC | PRN
  Administered 2017-09-13: 500 mL

## 2017-09-13 MED ORDER — SODIUM CHLORIDE 0.9 % IV SOLN: 250.0000 mL | INTRAVENOUS | Status: DC

## 2017-09-13 MED ORDER — PANTOPRAZOLE SODIUM 40 MG IV SOLR: 40.0000 mg | Freq: Every day | INTRAVENOUS | Status: DC

## 2017-09-13 MED ORDER — HYDROMORPHONE HCL 1 MG/ML IJ SOLN
0.2500 mg | INTRAMUSCULAR | Status: DC | PRN
  Administered 2017-09-13: 0.25 mg via INTRAVENOUS

## 2017-09-13 MED ORDER — VITAMIN D3 25 MCG (1000 UNIT) PO TABS
2000.0000 [IU] | ORAL_TABLET | Freq: Every day | ORAL | Status: DC
  Administered 2017-09-13 – 2017-09-14 (×2): 2000 [IU] via ORAL
  Filled 2017-09-13 (×4): qty 2

## 2017-09-13 MED ORDER — HEMOSTATIC AGENTS (NO CHARGE) OPTIME
TOPICAL | Status: DC | PRN
  Administered 2017-09-13: 1 via TOPICAL

## 2017-09-13 MED ORDER — INSULIN GLARGINE 100 UNIT/ML ~~LOC~~ SOLN
50.0000 [IU] | Freq: Every day | SUBCUTANEOUS | Status: DC
  Administered 2017-09-13: 50 [IU] via SUBCUTANEOUS
  Filled 2017-09-13: qty 0.5

## 2017-09-13 MED ORDER — ALLOPURINOL 100 MG PO TABS
100.0000 mg | ORAL_TABLET | Freq: Two times a day (BID) | ORAL | Status: DC
  Administered 2017-09-14: 100 mg via ORAL
  Filled 2017-09-13: qty 1

## 2017-09-13 MED ORDER — EPHEDRINE 5 MG/ML INJ
INTRAVENOUS | Status: AC
  Filled 2017-09-13: qty 10

## 2017-09-13 MED ORDER — ADULT MULTIVITAMIN W/MINERALS CH
1.0000 | ORAL_TABLET | Freq: Every day | ORAL | Status: DC
  Administered 2017-09-13 – 2017-09-14 (×2): 1 via ORAL
  Filled 2017-09-13 (×4): qty 1

## 2017-09-13 MED ORDER — FINASTERIDE 5 MG PO TABS
5.0000 mg | ORAL_TABLET | Freq: Every day | ORAL | Status: DC
  Administered 2017-09-13: 5 mg via ORAL
  Filled 2017-09-13: qty 1

## 2017-09-13 MED ORDER — HYDROMORPHONE HCL 1 MG/ML IJ SOLN
INTRAMUSCULAR | Status: AC
  Filled 2017-09-13: qty 1

## 2017-09-13 MED ORDER — CHLORHEXIDINE GLUCONATE CLOTH 2 % EX PADS: 6.0000 | MEDICATED_PAD | Freq: Once | CUTANEOUS | Status: DC

## 2017-09-13 MED ORDER — OXYCODONE HCL 5 MG PO TABS
ORAL_TABLET | ORAL | Status: AC
  Administered 2017-09-13: 10 mg via ORAL
  Filled 2017-09-13: qty 2

## 2017-09-13 MED ORDER — ONDANSETRON HCL 4 MG/2ML IJ SOLN: 4.0000 mg | Freq: Four times a day (QID) | INTRAMUSCULAR | Status: DC | PRN

## 2017-09-13 MED ORDER — MEPERIDINE HCL 25 MG/ML IJ SOLN: 6.2500 mg | INTRAMUSCULAR | Status: DC | PRN

## 2017-09-13 MED ORDER — INSULIN ASPART 100 UNIT/ML ~~LOC~~ SOLN
0.0000 [IU] | Freq: Three times a day (TID) | SUBCUTANEOUS | Status: DC
  Administered 2017-09-13: 11 [IU] via SUBCUTANEOUS
  Administered 2017-09-14: 3 [IU] via SUBCUTANEOUS

## 2017-09-13 MED ORDER — PHENOL 1.4 % MT LIQD: 1.0000 | OROMUCOSAL | Status: DC | PRN

## 2017-09-13 MED ORDER — PROPOFOL 10 MG/ML IV BOLUS
INTRAVENOUS | Status: AC
  Filled 2017-09-13: qty 40

## 2017-09-13 MED ORDER — LIDOCAINE 2% (20 MG/ML) 5 ML SYRINGE
INTRAMUSCULAR | Status: AC
  Filled 2017-09-13: qty 5

## 2017-09-13 MED ORDER — TIMOLOL MALEATE 0.5 % OP SOLN
1.0000 [drp] | Freq: Every day | OPHTHALMIC | Status: DC
  Filled 2017-09-13: qty 5

## 2017-09-13 MED ORDER — SUGAMMADEX SODIUM 200 MG/2ML IV SOLN
INTRAVENOUS | Status: AC
  Filled 2017-09-13: qty 2

## 2017-09-13 MED ORDER — DORZOLAMIDE HCL 2 % OP SOLN
1.0000 [drp] | Freq: Two times a day (BID) | OPHTHALMIC | Status: DC
  Administered 2017-09-13: 1 [drp] via OPHTHALMIC
  Filled 2017-09-13: qty 10

## 2017-09-13 MED ORDER — HYDROMORPHONE HCL 1 MG/ML IJ SOLN: 0.5000 mg | INTRAMUSCULAR | Status: DC | PRN

## 2017-09-13 MED ORDER — MELOXICAM 7.5 MG PO TABS
7.5000 mg | ORAL_TABLET | Freq: Every day | ORAL | Status: DC
  Administered 2017-09-13 – 2017-09-14 (×2): 7.5 mg via ORAL
  Filled 2017-09-13 (×2): qty 1

## 2017-09-13 MED ORDER — DEXAMETHASONE SODIUM PHOSPHATE 10 MG/ML IJ SOLN
INTRAMUSCULAR | Status: AC
  Filled 2017-09-13: qty 1

## 2017-09-13 MED ORDER — LIDOCAINE 2% (20 MG/ML) 5 ML SYRINGE
INTRAMUSCULAR | Status: DC | PRN
  Administered 2017-09-13: 100 mg via INTRAVENOUS

## 2017-09-13 MED ORDER — LIDOCAINE-EPINEPHRINE 1 %-1:100000 IJ SOLN
INTRAMUSCULAR | Status: DC | PRN
  Administered 2017-09-13: 5 mL

## 2017-09-13 MED ORDER — GLIPIZIDE 10 MG PO TABS
10.0000 mg | ORAL_TABLET | Freq: Every day | ORAL | Status: DC
  Administered 2017-09-14: 10 mg via ORAL
  Filled 2017-09-13: qty 1

## 2017-09-13 MED ORDER — ATORVASTATIN CALCIUM 20 MG PO TABS
20.0000 mg | ORAL_TABLET | Freq: Every day | ORAL | Status: DC
  Administered 2017-09-13: 20 mg via ORAL
  Filled 2017-09-13: qty 1

## 2017-09-13 MED ORDER — CYCLOBENZAPRINE HCL 10 MG PO TABS
ORAL_TABLET | ORAL | Status: AC
  Administered 2017-09-13: 10 mg via ORAL
  Filled 2017-09-13: qty 1

## 2017-09-13 MED ORDER — LACTATED RINGERS IV SOLN
INTRAVENOUS | Status: DC | PRN
  Administered 2017-09-13 (×2): via INTRAVENOUS

## 2017-09-13 MED ORDER — ASPIRIN EC 81 MG PO TBEC
81.0000 mg | DELAYED_RELEASE_TABLET | Freq: Every day | ORAL | Status: DC
  Administered 2017-09-13 – 2017-09-14 (×2): 81 mg via ORAL
  Filled 2017-09-13 (×2): qty 1

## 2017-09-13 MED ORDER — METFORMIN HCL 500 MG PO TABS
1000.0000 mg | ORAL_TABLET | Freq: Two times a day (BID) | ORAL | Status: DC
  Administered 2017-09-13 – 2017-09-14 (×2): 1000 mg via ORAL
  Filled 2017-09-13 (×2): qty 2

## 2017-09-13 MED ORDER — SODIUM CHLORIDE 0.9% FLUSH
3.0000 mL | Freq: Two times a day (BID) | INTRAVENOUS | Status: DC
  Administered 2017-09-14: 3 mL via INTRAVENOUS

## 2017-09-13 MED ORDER — SODIUM CHLORIDE 0.9% FLUSH: 3.0000 mL | INTRAVENOUS | Status: DC | PRN

## 2017-09-13 MED ORDER — AMLODIPINE BESYLATE 5 MG PO TABS
5.0000 mg | ORAL_TABLET | Freq: Every day | ORAL | Status: DC
  Administered 2017-09-14: 5 mg via ORAL
  Filled 2017-09-13: qty 1

## 2017-09-13 MED ORDER — FENTANYL CITRATE (PF) 100 MCG/2ML IJ SOLN
INTRAMUSCULAR | Status: DC | PRN
  Administered 2017-09-13: 50 ug via INTRAVENOUS
  Administered 2017-09-13: 100 ug via INTRAVENOUS

## 2017-09-13 MED ORDER — ONDANSETRON HCL 4 MG/2ML IJ SOLN
INTRAMUSCULAR | Status: AC
  Filled 2017-09-13: qty 2

## 2017-09-13 MED ORDER — DOXAZOSIN MESYLATE 4 MG PO TABS
4.0000 mg | ORAL_TABLET | Freq: Every day | ORAL | Status: DC
  Administered 2017-09-13: 4 mg via ORAL
  Filled 2017-09-13: qty 1

## 2017-09-13 MED ORDER — FENTANYL CITRATE (PF) 250 MCG/5ML IJ SOLN
INTRAMUSCULAR | Status: AC
  Filled 2017-09-13: qty 5

## 2017-09-13 MED ORDER — ACETAMINOPHEN 650 MG RE SUPP: 650.0000 mg | RECTAL | Status: DC | PRN

## 2017-09-13 MED ORDER — OMEGA-3 FISH OIL 1000 MG PO CAPS: 1.0000 | ORAL_CAPSULE | Freq: Every day | ORAL | Status: DC

## 2017-09-13 MED ORDER — FAMOTIDINE 20 MG PO TABS
10.0000 mg | ORAL_TABLET | Freq: Every day | ORAL | Status: DC
  Administered 2017-09-13 – 2017-09-14 (×2): 10 mg via ORAL
  Filled 2017-09-13 (×2): qty 1

## 2017-09-13 MED ORDER — SUGAMMADEX SODIUM 200 MG/2ML IV SOLN
INTRAVENOUS | Status: DC | PRN
  Administered 2017-09-13: 200 mg via INTRAVENOUS

## 2017-09-13 MED ORDER — DEXAMETHASONE SODIUM PHOSPHATE 10 MG/ML IJ SOLN
10.0000 mg | INTRAMUSCULAR | Status: AC
  Administered 2017-09-13: 10 mg via INTRAVENOUS

## 2017-09-13 MED ORDER — ROCURONIUM BROMIDE 10 MG/ML (PF) SYRINGE
PREFILLED_SYRINGE | INTRAVENOUS | Status: DC | PRN
  Administered 2017-09-13: 50 mg via INTRAVENOUS
  Administered 2017-09-13: 20 mg via INTRAVENOUS
  Administered 2017-09-13: 10 mg via INTRAVENOUS

## 2017-09-13 MED ORDER — CYANOCOBALAMIN 500 MCG PO TABS
500.0000 ug | ORAL_TABLET | Freq: Every day | ORAL | Status: DC
  Administered 2017-09-13 – 2017-09-14 (×2): 500 ug via ORAL
  Filled 2017-09-13 (×2): qty 1

## 2017-09-13 MED ORDER — LIDOCAINE-EPINEPHRINE 1 %-1:100000 IJ SOLN
INTRAMUSCULAR | Status: AC
  Filled 2017-09-13: qty 1

## 2017-09-13 MED ORDER — THROMBIN (RECOMBINANT) 5000 UNITS EX SOLR
CUTANEOUS | Status: AC
  Filled 2017-09-13: qty 10000

## 2017-09-13 MED ORDER — CEFAZOLIN SODIUM-DEXTROSE 2-4 GM/100ML-% IV SOLN
2.0000 g | INTRAVENOUS | Status: AC
  Administered 2017-09-13: 2 g via INTRAVENOUS
  Filled 2017-09-13: qty 100

## 2017-09-13 MED ORDER — ALUM & MAG HYDROXIDE-SIMETH 200-200-20 MG/5ML PO SUSP: 30.0000 mL | Freq: Four times a day (QID) | ORAL | Status: DC | PRN

## 2017-09-13 SURGICAL SUPPLY — 58 items
BAG DECANTER FOR FLEXI CONT (MISCELLANEOUS) ×3 IMPLANT
BENZOIN TINCTURE PRP APPL 2/3 (GAUZE/BANDAGES/DRESSINGS) ×3 IMPLANT
BLADE CLIPPER SURG (BLADE) IMPLANT
BLADE SURG 11 STRL SS (BLADE) ×3 IMPLANT
BUR CUTTER 7.0 ROUND (BURR) ×3 IMPLANT
BUR MATCHSTICK NEURO 3.0 LAGG (BURR) ×3 IMPLANT
CANISTER SUCT 3000ML PPV (MISCELLANEOUS) ×3 IMPLANT
CARTRIDGE OIL MAESTRO DRILL (MISCELLANEOUS) ×1 IMPLANT
CLOSURE WOUND 1/2 X4 (GAUZE/BANDAGES/DRESSINGS) ×1
DECANTER SPIKE VIAL GLASS SM (MISCELLANEOUS) ×3 IMPLANT
DERMABOND ADVANCED (GAUZE/BANDAGES/DRESSINGS) ×2
DERMABOND ADVANCED .7 DNX12 (GAUZE/BANDAGES/DRESSINGS) ×1 IMPLANT
DIFFUSER DRILL AIR PNEUMATIC (MISCELLANEOUS) ×3 IMPLANT
DRAPE HALF SHEET 40X57 (DRAPES) IMPLANT
DRAPE LAPAROTOMY 100X72X124 (DRAPES) ×3 IMPLANT
DRAPE MICROSCOPE LEICA (MISCELLANEOUS) ×3 IMPLANT
DRAPE POUCH INSTRU U-SHP 10X18 (DRAPES) ×3 IMPLANT
DRAPE SURG 17X23 STRL (DRAPES) ×3 IMPLANT
DRSG OPSITE 4X5.5 SM (GAUZE/BANDAGES/DRESSINGS) ×3 IMPLANT
DRSG OPSITE POSTOP 4X8 (GAUZE/BANDAGES/DRESSINGS) ×3 IMPLANT
DURAPREP 26ML APPLICATOR (WOUND CARE) ×3 IMPLANT
ELECT REM PT RETURN 9FT ADLT (ELECTROSURGICAL) ×3
ELECTRODE REM PT RTRN 9FT ADLT (ELECTROSURGICAL) ×1 IMPLANT
EVACUATOR 1/8 PVC DRAIN (DRAIN) ×3 IMPLANT
GAUZE SPONGE 4X4 12PLY STRL (GAUZE/BANDAGES/DRESSINGS) IMPLANT
GAUZE SPONGE 4X4 16PLY XRAY LF (GAUZE/BANDAGES/DRESSINGS) ×3 IMPLANT
GLOVE BIO SURGEON STRL SZ7 (GLOVE) ×3 IMPLANT
GLOVE BIO SURGEON STRL SZ8 (GLOVE) ×6 IMPLANT
GLOVE BIO SURGEON STRL SZ8.5 (GLOVE) ×3 IMPLANT
GLOVE BIOGEL PI IND STRL 7.0 (GLOVE) ×1 IMPLANT
GLOVE BIOGEL PI INDICATOR 7.0 (GLOVE) ×2
GLOVE ECLIPSE 7.5 STRL STRAW (GLOVE) IMPLANT
GLOVE EXAM NITRILE LRG STRL (GLOVE) IMPLANT
GLOVE EXAM NITRILE XL STR (GLOVE) IMPLANT
GLOVE EXAM NITRILE XS STR PU (GLOVE) IMPLANT
GLOVE INDICATOR 8.5 STRL (GLOVE) ×6 IMPLANT
GOWN STRL REUS W/ TWL LRG LVL3 (GOWN DISPOSABLE) ×1 IMPLANT
GOWN STRL REUS W/ TWL XL LVL3 (GOWN DISPOSABLE) ×3 IMPLANT
GOWN STRL REUS W/TWL 2XL LVL3 (GOWN DISPOSABLE) IMPLANT
GOWN STRL REUS W/TWL LRG LVL3 (GOWN DISPOSABLE) ×2
GOWN STRL REUS W/TWL XL LVL3 (GOWN DISPOSABLE) ×6
KIT BASIN OR (CUSTOM PROCEDURE TRAY) ×3 IMPLANT
KIT ROOM TURNOVER OR (KITS) ×3 IMPLANT
NEEDLE HYPO 22GX1.5 SAFETY (NEEDLE) ×3 IMPLANT
NEEDLE SPNL 22GX3.5 QUINCKE BK (NEEDLE) ×3 IMPLANT
NS IRRIG 1000ML POUR BTL (IV SOLUTION) ×3 IMPLANT
OIL CARTRIDGE MAESTRO DRILL (MISCELLANEOUS) ×3
PACK LAMINECTOMY NEURO (CUSTOM PROCEDURE TRAY) ×3 IMPLANT
RUBBERBAND STERILE (MISCELLANEOUS) ×6 IMPLANT
SPONGE SURGIFOAM ABS GEL SZ50 (HEMOSTASIS) ×3 IMPLANT
STRIP CLOSURE SKIN 1/2X4 (GAUZE/BANDAGES/DRESSINGS) ×2 IMPLANT
SUT VIC AB 0 CT1 18XCR BRD8 (SUTURE) ×1 IMPLANT
SUT VIC AB 0 CT1 8-18 (SUTURE) ×2
SUT VIC AB 2-0 CT1 18 (SUTURE) ×6 IMPLANT
SUT VIC AB 4-0 PS2 27 (SUTURE) ×3 IMPLANT
TOWEL GREEN STERILE (TOWEL DISPOSABLE) ×3 IMPLANT
TOWEL GREEN STERILE FF (TOWEL DISPOSABLE) ×3 IMPLANT
WATER STERILE IRR 1000ML POUR (IV SOLUTION) ×3 IMPLANT

## 2017-09-13 NOTE — Transfer of Care (Signed)
Immediate Anesthesia Transfer of Care Note  Patient: Tracy [name]  Procedure(s) Performed: BILATERAL Lumbar three-four , Left  Lumbar four-five  AND Left  L5-S1, LAMINECTOMY/FORAMINOTOMY (Back)  Patient Location: PACU  Anesthesia Type:General  Level of Consciousness: drowsy and patient cooperative  Airway & Oxygen Therapy: Patient Spontanous Breathing and Patient connected to nasal cannula oxygen  Post-op Assessment: Report given to RN, Post -op Vital signs reviewed and stable and Patient moving all extremities X 4  Post vital signs: Reviewed and stable  Last Vitals:  Vitals:   09/13/17 0645  BP: (!) 153/69  Pulse: 60  Resp: 20  Temp: 36.5 C  SpO2: 95%    Last Pain:  Vitals:   09/13/17 0645  TempSrc: Oral      Patients Stated Pain Goal: 3 (49/61/16 4353)  Complications: No apparent anesthesia complications

## 2017-09-13 NOTE — Anesthesia Procedure Notes (Signed)
Procedure Name: Intubation Date/Time: 09/13/2017 8:37 AM Performed by: Colin Benton, CRNA Pre-anesthesia Checklist: Patient identified, Emergency Drugs available, Suction available and Patient being monitored Patient Re-evaluated:Patient Re-evaluated prior to induction Oxygen Delivery Method: Circle system utilized Preoxygenation: Pre-oxygenation with 100% oxygen Induction Type: IV induction Ventilation: Mask ventilation without difficulty and Oral airway inserted - appropriate to patient size Laryngoscope Size: Mac and 3 Grade View: Grade II Tube type: Oral Tube size: 7.5 mm Number of attempts: 2 Airway Equipment and Method: Stylet Placement Confirmation: ETT inserted through vocal cords under direct vision,  positive ETCO2 and breath sounds checked- equal and bilateral Secured at: 23 cm Tube secured with: Tape Dental Injury: Teeth and Oropharynx as per pre-operative assessment  Comments: DL x1 with MAC 3 by CRNA.  Grade 3 view.  Unable to pass ETT anterior.  DL x2 by MD.  Successful intubation with grade 2 view.

## 2017-09-13 NOTE — Anesthesia Postprocedure Evaluation (Signed)
Anesthesia Post Note  Patient: Tracy [name]  Procedure(s) Performed: BILATERAL Lumbar three-four , Left  Lumbar four-five  AND Left  L5-S1, LAMINECTOMY/FORAMINOTOMY (Back)     Patient location during evaluation: PACU Anesthesia Type: General Level of consciousness: awake and alert Pain management: pain level controlled Vital Signs Assessment: post-procedure vital signs reviewed and stable Respiratory status: spontaneous breathing, nonlabored ventilation, respiratory function stable and patient connected to nasal cannula oxygen Cardiovascular status: blood pressure returned to baseline and stable Postop Assessment: no apparent nausea or vomiting Anesthetic complications: no    Last Vitals:  Vitals:   09/13/17 1220 09/13/17 1230  BP: (!) 119/51 (!) 142/72  Pulse: 63 68  Resp: 16   Temp: (!) 36.2 C 36.4 C  SpO2: 94% 94%    Last Pain:  Vitals:   09/13/17 1230  TempSrc: Oral  PainSc:                  Jaeli Grubb DAVID

## 2017-09-13 NOTE — Anesthesia Preprocedure Evaluation (Addendum)
Anesthesia Evaluation  Patient identified by MRN, date of birth, ID band Patient awake    Reviewed: Allergy & Precautions, NPO status , Patient's Chart, lab work & pertinent test results  Airway Mallampati: I  TM Distance: >3 FB Neck ROM: Full    Dental  (+) Teeth Intact, Dental Advidsory Given   Pulmonary former smoker,    Pulmonary exam normal        Cardiovascular hypertension, + Past MI  Normal cardiovascular exam     Neuro/Psych    GI/Hepatic GERD  Medicated and Controlled,  Endo/Other  diabetes, Type 2, Oral Hypoglycemic Agents, Insulin Dependent  Renal/GU      Musculoskeletal   Abdominal   Peds  Hematology   Anesthesia Other Findings   Reproductive/Obstetrics                            Anesthesia Physical Anesthesia Plan  ASA: III  Anesthesia Plan: General   Post-op Pain Management:    Induction: Intravenous  PONV Risk Score and Plan: 2 and Ondansetron and Midazolam  Airway Management Planned: Oral ETT  Additional Equipment:   Intra-op Plan:   Post-operative Plan: Extubation in OR  Informed Consent: I have reviewed the patients History and Physical, chart, labs and discussed the procedure including the risks, benefits and alternatives for the proposed anesthesia with the patient or authorized representative who has indicated his/her understanding and acceptance.     Plan Discussed with: CRNA and Surgeon  Anesthesia Plan Comments:         Anesthesia Quick Evaluation

## 2017-09-13 NOTE — H&P (Signed)
Tracy [name] is an 84 y.o. male.   Chief Complaint: back and bilateral leg pain HPI: 84 year old gentleman with long-standing back pain and neurogenic claudication workup revealed severe spinal stenosis at L3-4 0 as well as severe foraminal stenosis at L4-5 and L5-S1 the left. Due to patient's progression of clinical syndrome imaging findings failure conservative treatment I recommended decompressive laminectomy bilaterally at L3-4 left-sided laminotomies for decompression L4-5 L5-S1. I extensively went over the risks and benefits of the operation with him as well as perioperative course expectations of outcome and alternatives surgery and he understands and agrees to proceed forward.  Past Medical History:  Diagnosis Date  . Arthritis   . Cancer [facility])    SKIN CANCER  . Diabetes mellitus without complication ([place])   . GERD (gastroesophageal reflux disease)   . Hypertension   . Myocardial infarction [facility])    08/27/1979    Past Surgical History:  Procedure Laterality Date  . CATARACT EXTRACTION W/ INTRAOCULAR LENS  IMPLANT, BILATERAL    . CIRCUMCISION    . GANGLION CYST EXCISION     LEFT HAND  . MELANOMA EXCISION  07/2011  . TONSILLECTOMY    . VASECTOMY      Family History  Problem Relation Age of Onset  . Heart disease Mother   . Diabetes Mother        type 2  . Heart disease Brother    Social History:  reports that he has quit smoking. His smoking use included cigarettes. he has never used smokeless tobacco. He reports that he drinks alcohol. He reports that he does not use drugs.  Allergies:  Allergies  Allergen Reactions  . Tetracyclines & Related     UNSPECIFIED REACTION     Medications Prior to Admission  Medication Sig Dispense Refill  . ACCU-CHEK FASTCLIX LANCETS MISC CHECK  BLOOD  SUGAR TWICE DAILY 204 each 1  . allopurinol (ZYLOPRIM) 100 MG tablet Take 1 tablet (100 mg total) by mouth 2 (two) times daily. 180 tablet 1  . amLODipine (NORVASC) 5 MG tablet  Take 1 tablet (5 mg total) by mouth daily. 90 tablet 1  . aspirin 81 MG tablet Take 81 mg by mouth.    Marland Kitchen atorvastatin (LIPITOR) 10 MG tablet TAKE 1 TABLET DAILY AT 6 PM. 90 tablet 1  . Blood Glucose Monitoring Suppl (ACCU-CHEK NANO SMARTVIEW) W/DEVICE KIT USE AS DIRECTED 1 kit 0  . Cholecalciferol (VITAMIN D) 2000 units tablet Take 2,000 Units by mouth daily.    . Cobalamine Combinations (B12 FOLATE PO) Take 1 tablet by mouth daily.    . dorzolamide (TRUSOPT) 2 % ophthalmic solution     . doxazosin (CARDURA) 4 MG tablet Take 1 tablet (4 mg total) by mouth at bedtime. 90 tablet 1  . finasteride (PROSCAR) 5 MG tablet Take 1 tablet (5 mg total) by mouth at bedtime. 90 tablet 1  . glipiZIDE (GLUCOTROL) 10 MG tablet TAKE 1 TABLET TWICE DAILY BEFORE MEALS 180 tablet 1  . glucose blood (COOL BLOOD GLUCOSE TEST STRIPS) test strip Use as instructed 100 each 12  . Insulin Glargine (LANTUS SOLOSTAR) 100 UNIT/ML Solostar Pen Inject 50 Units into the skin at bedtime.     . Insulin Pen Needle (B-D ULTRAFINE III SHORT PEN) 31G X 8 MM MISC Use as directed with Insulin pen. 100 each 0  . lisinopril (PRINIVIL,ZESTRIL) 40 MG tablet Take 1 tablet (40 mg total) by mouth daily. 90 tablet 0  . meloxicam (MOBIC) 7.5 MG  tablet Take 1 tablet (7.5 mg total) by mouth daily as needed. 30 tablet 0  . metFORMIN (GLUCOPHAGE) 1000 MG tablet Take 1 tablet (1,000 mg total) by mouth 2 (two) times daily with a meal. 180 tablet 1  . Multiple Vitamin (DAILY VITAMINS) tablet Take by mouth.    . Multiple Vitamins-Minerals (CENTRUM SILVER 50+MEN) TABS Take 1 tablet by mouth daily.    Marland Kitchen NEEDLE, DISP, 14 G 14G X 1" MISC     . omega-3 fish oil (MAXEPA) 1000 MG CAPS capsule Take 1 capsule by mouth daily.    . ranitidine (ZANTAC) 150 MG capsule Take 1 capsule (150 mg total) by mouth daily as needed. 90 capsule 0  . timolol (TIMOPTIC) 0.5 % ophthalmic solution Frequency:QD   Dosage:0.0     Instructions:  Note:Dose: 0.5 %, OS    . vitamin  B-12 (CYANOCOBALAMIN) 500 MCG tablet Take 1 tablet by mouth daily.      Results for orders placed or performed during the hospital encounter of 09/13/17 (from the past 48 hour(s))  Glucose, capillary     Status: Abnormal   Collection Time: 09/13/17  6:41 AM  Result Value Ref Range   Glucose-Capillary 114 (H) 65 - 99 mg/dL   Comment 1 Notify RN    No results found.  Review of Systems  Musculoskeletal: Positive for back pain and myalgias.  Neurological: Positive for tingling and sensory change.    Blood pressure (!) 153/69, pulse 60, temperature 97.7 F (36.5 C), temperature source Oral, resp. rate 20, height 5' 10"  (1.778 m), weight 81.5 kg (179 lb 10.8 oz), SpO2 95 %. Physical Exam  Constitutional: He appears well-developed and well-nourished.  HENT:  Head: Normocephalic.  Eyes: Pupils are equal, round, and reactive to light.  Neck: Normal range of motion.  GI: Soft.  Neurological: He is alert. He has normal strength. GCS eye subscore is 4. GCS verbal subscore is 5. GCS motor subscore is 6.  Strength 5 out of 5 iliopsoas, quads, hamstrings, gastric, and tibialis, EHL.  Skin: Skin is warm and dry.     Assessment/Plan 84 year old gentleman presents for a decompressive laminectomy L3-4 and an left-sided 45, 51.  Sharalyn Lomba P, MD 09/13/2017, 7:59 AM

## 2017-09-13 NOTE — Evaluation (Signed)
Physical Therapy Evaluation Patient Details Name: Tracy [name] MRN: [id number] DOB: [birth year] Today's Date: 09/13/2017   History of Present Illness  Pt is an 84 y/o male s/p L3-S1 laminectomy. PMH includes skin cancer, DM, HTN, and MI.   Clinical Impression  Pt is s/p surgery above with deficits below. PTA, pt was independent with mobility. Upon eval, pt reporting tightness in back, and was slightly unsteady during gait. Required min guard and use of IV pole for mobility this session. Reports wife will be able to assist at home and will need DME below. Will continue to follow acutely to maximize functional mobility independence and safety.     Follow Up Recommendations No PT follow up;Supervision - Intermittent    Equipment Recommendations  3in1 (PT)    Recommendations for Other Services       Precautions / Restrictions Precautions Precautions: Back Precaution Booklet Issued: Yes (comment) Precaution Comments: Reviewed back precautions with pt.  Restrictions Weight Bearing Restrictions: No      Mobility  Bed Mobility Overal bed mobility: Needs Assistance Bed Mobility: Rolling;Sidelying to Sit;Sit to Sidelying Rolling: Supervision Sidelying to sit: Supervision     Sit to sidelying: Supervision General bed mobility comments: Supervision to ensure log roll technique. Required verbal cues for sequencing.   Transfers Overall transfer level: Needs assistance Equipment used: None Transfers: Sit to/from Stand Sit to Stand: Min guard         General transfer comment: Min guard for steadying assist. Verbal cues to power through LEs.   Ambulation/Gait Ambulation/Gait assistance: Min guard Ambulation Distance (Feet): 300 Feet Assistive device: (IV pole ) Gait Pattern/deviations: Step-through pattern;Decreased stride length Gait velocity: Decreaed Gait velocity interpretation: Below normal speed for age/gender General Gait Details: Slow, guarded gait. Slightly  unsteady; required use of IV pole and min guard for safety during ambulation. Pt reported feeling wobbly. Educated to use cane at home if feeling unsteady at home.   Stairs            Wheelchair Mobility    Modified Rankin (Stroke Patients Only)       Balance Overall balance assessment: Needs assistance Sitting-balance support: No upper extremity supported;Feet supported Sitting balance-Leahy Scale: Good     Standing balance support: No upper extremity supported;During functional activity Standing balance-Leahy Scale: Fair Standing balance comment: Able to maintain static standing without UE support.                              Pertinent Vitals/Pain Pain Assessment: Faces Faces Pain Scale: Hurts little more Pain Location: back  Pain Descriptors / Indicators: Guarding;Grimacing;Tightness Pain Intervention(s): Limited activity within patient's tolerance;Monitored during session;Repositioned    Home Living Family/patient expects to be discharged to:: Private residence Living Arrangements: Spouse/significant other Available Help at Discharge: Family;Available 24 hours/day Type of Home: House Home Access: Stairs to enter Entrance Stairs-Rails: Psychiatric nurse of Steps: 7 Home Layout: One level Home Equipment: Cane - single point      Prior Function Level of Independence: Independent               Hand Dominance   Dominant Hand: Right    Extremity/Trunk Assessment   Upper Extremity Assessment Upper Extremity Assessment: Defer to OT evaluation    Lower Extremity Assessment Lower Extremity Assessment: RLE deficits/detail RLE Deficits / Details: REports no RLE pain during ambulation, as he had before.     Cervical / Trunk Assessment Cervical / Trunk Assessment:  Other exceptions Cervical / Trunk Exceptions: s/p laminectomy   Communication   Communication: No difficulties  Cognition Arousal/Alertness: Awake/alert Behavior  During Therapy: WFL for tasks assessed/performed Overall Cognitive Status: Within Functional Limits for tasks assessed                                        General Comments General comments (skin integrity, edema, etc.): Pt's wife present during session. Educated about generalized walking program.     Exercises     Assessment/Plan    PT Assessment Patient needs continued PT services  PT Problem List Decreased strength;Decreased balance;Decreased mobility;Decreased knowledge of use of DME;Decreased knowledge of precautions;Pain       PT Treatment Interventions Stair training;Gait training;Functional mobility training;Therapeutic activities;Therapeutic exercise;Balance training;Neuromuscular re-education;Patient/family education    PT Goals (Current goals can be found in the Care Plan section)  Acute Rehab PT Goals Patient Stated Goal: to go home  PT Goal Formulation: With patient Time For Goal Achievement: 09/20/17 Potential to Achieve Goals: Good    Frequency Min 5X/week   Barriers to discharge        Co-evaluation               AM-PAC PT "6 Clicks" Daily Activity  Outcome Measure Difficulty turning over in bed (including adjusting bedclothes, sheets and blankets)?: A Little Difficulty moving from lying on back to sitting on the side of the bed? : A Little Difficulty sitting down on and standing up from a chair with arms (e.g., wheelchair, bedside commode, etc,.)?: Unable Help needed moving to and from a bed to chair (including a wheelchair)?: A Little Help needed walking in hospital room?: A Little Help needed climbing 3-5 steps with a railing? : A Little 6 Click Score: 16    End of Session Equipment Utilized During Treatment: Gait belt Activity Tolerance: Patient tolerated treatment well Patient left: in bed;with call bell/phone within reach;with family/visitor present Nurse Communication: Mobility status PT Visit Diagnosis: Other  abnormalities of gait and mobility (R26.89);Pain Pain - part of body: (back )    Time: 1407-1430 PT Time Calculation (min) (ACUTE ONLY): 23 min   Charges:   PT Evaluation $PT Eval Low Complexity: 1 Low PT Treatments $Gait Training: 8-22 mins   PT G Codes:        Leighton Ruff, PT, DPT  Acute Rehabilitation Services  Pager: [phone]   Rudean Hitt 09/13/2017, 3:55 PM

## 2017-09-13 NOTE — Op Note (Signed)
Preoperative diagnosis: #1 severe central and foraminal spinal stenosis L3-4 with bilateral L3 and L4 radiculopathies  #2 spinal stenosis on the left at L4-5 with foraminal stenosis and left L4 and L5 radiculopathies  #3 severe spinal stenosis L5-S1 on the left with left S1 radiculopathy  Postoperative diagnosis: Same to include synovial cyst on the left at L4-5  Procedure: #1 bilateral decompressive laminectomy L3-4 with bilateral foraminotomies of the L3 and L4 nerve roots  #2 left-sided decompressive laminectomy L4-5 with resection of left-sided synovial cyst and microscopic foraminotomy of the left L5 nerve root  #3 left-sided decompressive laminotomy L5-S1 with microdissection of the left S1 nerve root Mike's cup decompression.  Surgeon: Dominica Severin Nema Oatley  Asst.: Newman Pies  Anesthesia: Gen.  EBL: Minimal  History of present illness: 84 year old gentleman has had long-standing back pain leg weakness and pain going down both legs worse on the right. Workup revealed severe spinal stenosis at L3-4 as well as severe foraminal stenosis on the left L4-5 and L5-S1. Due to patient's fixator treatment imaging findings progression of clinical syndrome or recommended decompressive laminectomies at the above levels. I extensively went over the risks and benefits of the operation with the patient as well as perioperative course expectations of outcome alternatives of surgery and he understands and agrees to proceed forward.  Operative procedure: Patient brought in the or was used to draw anesthesia positioned prone the Wilson frame his back was prepped and draped in routine sterile fashion after infiltration of 10 mL lidocaine with epi a midline incision was made and Bovie light cautery was then to take down subcutaneous tissue and subperiosteal dissection was carried out bilaterally at L3-4 and on the left at L4-5 and L5-S1. Interoperative x-ray confirmed location proper level so it L3 for the spinous  process was removed at L3 central decompression was begun there was marked hourglass compression of thecal sac primarily on the right with a large spur this was all removed decompress the thecal thecal sac. Foraminotomies were performed at the L3 and L4 nerve roots bilaterally decompressing that level. This was then packed with Gelfoam to stick and L4-5 on the left. Inferior aspect limited L5 medial facet complex super aspect of the lamina of L5 was drilled down a high-speed drill laminotomy was begun with a 20 minute Kerrison punch ligament flavum was removed piecemeal fashion standard which was noted be markedly hypertrophied. Then under Mike's cup combination identified the L5 pedicle under bit the medial gutter and marching superiorly it's just above the disc space was a large synovial cyst densely adherent to the dura. This was teased off the dura with a 4 Penfield and removed piecemeal fashion decompress the lateral canal the undersurface of the 4 root. At the decompression was no further stenosis on the for the fiber to the left or the thecal sac. This was packed with Gelfoam and 2 seconds L5-S1. Inferior aspect limited L5 medial facet complex super aspect of lamina S1 was drilled down a high-speed drill laminotomy was cut was begun with a 2 and 3 Kerrison punch here the ligament was also noted be markedly protruded but there was a large spur coming off the facet joint displacing the dorsal aspect of the S1 nerve root this is all removed in piecemeal fashion unroofing the S1 foramen. And under microscopic illumination further foraminotomies were performed at S1 disc space was inspected and felt not to be herniated disc spondylitic and was left alone. Gelfoam was laid top dura the muscle fascia proximal in  layers after copious irrigation meticulous hemostasis and a medium Hemovac drain was placed. Wounds and closed in layers with after Vicryl skin was closed running 4 subcuticular or Dermabond benzo and  Steri-Strips and sterile dressing was applied patient recovered in stable condition at the end the case on it counts sponge counts were correct.

## 2017-09-14 ENCOUNTER — Encounter (HOSPITAL_COMMUNITY): Payer: Self-pay | Admitting: Neurosurgery

## 2017-09-14 LAB — GLUCOSE, CAPILLARY: GLUCOSE-CAPILLARY: 176 mg/dL — AB (ref 65–99)

## 2017-09-14 MED ORDER — CYCLOBENZAPRINE HCL 10 MG PO TABS: 10.0000 mg | ORAL_TABLET | Freq: Three times a day (TID) | ORAL | 0 refills | Status: DC | PRN

## 2017-09-14 MED ORDER — HYDROCODONE-ACETAMINOPHEN 5-325 MG PO TABS: 1.0000 | ORAL_TABLET | ORAL | 0 refills | Status: DC | PRN

## 2017-09-14 NOTE — Progress Notes (Signed)
Patient is discharged from room 3C07 at this time. Alert and in stable condition. IV site d/c'd and instructions read to patient and spouse with understanding verbalized. Left unit via wheelchair with all belongings at side. 

## 2017-09-14 NOTE — Evaluation (Signed)
Occupational Therapy Evaluation and Discharge Patient Details Name: Tracy [name] MRN: [id number] DOB: [birth year] Today's Date: 09/14/2017    History of Present Illness Pt is an 84 y/o male s/p L3-S1 laminectomy. PMH includes skin cancer, DM, HTN, and MI.    Clinical Impression   Pt reports he was independent with ADL PTA. Currently pt supervision with ADL and min guard for functional mobility. All back, safety, and ADL education completed with pt and wife. Pt planning to d/c home with 24/7 supervision from family. No further acute OT needs identified; signing off at this time. Please re-consult if needs change. Thank you for this referral.    Follow Up Recommendations  No OT follow up;Supervision/Assistance - 24 hour    Equipment Recommendations  3 in 1 bedside commode    Recommendations for Other Services       Precautions / Restrictions Precautions Precautions: Back Precaution Booklet Issued: No Precaution Comments: Reviewed back precautions with pt.  Restrictions Weight Bearing Restrictions: No      Mobility Bed Mobility       General bed mobility comments: OOB in chair upon arrival  Transfers Overall transfer level: Needs assistance Equipment used: None Transfers: Sit to/from Stand Sit to Stand: Min guard         General transfer comment: for safety, good hand placement and technique    Balance Overall balance assessment: Needs assistance Sitting-balance support: Feet supported;No upper extremity supported Sitting balance-Leahy Scale: Good     Standing balance support: No upper extremity supported;During functional activity Standing balance-Leahy Scale: Fair                            ADL either performed or assessed with clinical judgement   ADL Overall ADL's : Needs assistance/impaired Eating/Feeding: Set up;Sitting   Grooming: Supervision/safety;Standing Grooming Details (indicate cue type and reason): Educated on use of 2 cups  for oral care Upper Body Bathing: Set up;Sitting   Lower Body Bathing: Supervison/ safety;Sit to/from stand   Upper Body Dressing : Supervision/safety;Standing Upper Body Dressing Details (indicate cue type and reason): to don shirt Lower Body Dressing: Supervision/safety;Sit to/from stand Lower Body Dressing Details (indicate cue type and reason): Pt able to cross foot over opposite knee to don underwear, pants, socks, shoes Toilet Transfer: Min guard;Ambulation;Regular Teacher, adult education Details (indicate cue type and reason): Simulated by sit to stand from chair. Educated on use of 3 in 1 over toilet   Toileting - Clothing Manipulation Details (indicate cue type and reason): Educated on proper technique for peri care without twisting and use of wet wipes Tub/ Shower Transfer: Min guard;Tub transfer;Ambulation;3 in 1 Tub/Shower Transfer Details (indicate cue type and reason): Simulated in room with min guard assist, educated on 3 in 1 in tub as a seat and supervision for transfer inititially Functional mobility during ADLs: Min guard General ADL Comments: Educated pt on maintaining back precuations during functional activities, keeping frequently used items at counter top height, and frequent mobility theoughout the day upon return home.     Vision         Perception     Praxis      Pertinent Vitals/Pain Pain Assessment: Faces Faces Pain Scale: Hurts little more Pain Location: back Pain Descriptors / Indicators: Discomfort;Sore Pain Intervention(s): Monitored during session;Patient requesting pain meds-RN notified     Hand Dominance Right   Extremity/Trunk Assessment Upper Extremity Assessment Upper Extremity Assessment: Overall WFL for tasks assessed  Lower Extremity Assessment Lower Extremity Assessment: Defer to PT evaluation   Cervical / Trunk Assessment Cervical / Trunk Assessment: Other exceptions Cervical / Trunk Exceptions: s/p laminectomy     Communication Communication Communication: No difficulties   Cognition Arousal/Alertness: Awake/alert Behavior During Therapy: WFL for tasks assessed/performed Overall Cognitive Status: Within Functional Limits for tasks assessed                                     General Comments       Exercises     Shoulder Instructions      Home Living Family/patient expects to be discharged to:: Private residence Living Arrangements: Spouse/significant other Available Help at Discharge: Family;Available 24 hours/day Type of Home: House Home Access: Stairs to enter [employer] of Steps: 7 Entrance Stairs-Rails: Right;Left Home Layout: Two level;Able to live on main level with bedroom/bathroom     Bathroom Shower/Tub: Tub/shower unit;Door   [employer] Toilet: Standard     Home Equipment: [employer] - single point          Prior Functioning/Environment Level of Independence: Independent                 OT Problem List:        OT Treatment/Interventions:      OT Goals(Current goals can be found in the care plan section) Acute Rehab OT Goals Patient Stated Goal: to go home  OT Goal Formulation: All assessment and education complete, DC therapy  OT Frequency:     Barriers to D/C:            Co-evaluation              AM-PAC PT "6 Clicks" Daily Activity     Outcome Measure Help from another person eating meals?: None Help from another person taking care of personal grooming?: A Little Help from another person toileting, which includes using toliet, bedpan, or urinal?: A Little Help from another person bathing (including washing, rinsing, drying)?: A Little Help from another person to put on and taking off regular upper body clothing?: A Little Help from another person to put on and taking off regular lower body clothing?: A Little 6 Click Score: 19   End of Session Nurse Communication: Mobility status;Patient requests pain meds;Other  (comment)(equipment and f/u needs)  Activity Tolerance: Patient tolerated treatment well Patient left: in chair;with call bell/phone within reach;with family/visitor present  OT Visit Diagnosis: Unsteadiness on feet (R26.81);Pain Pain - part of body: (back)                Time: 0812-0826 OT Time Calculation (min): 14 min Charges:  OT General Charges $OT Visit: 1 Visit OT Evaluation $OT Eval Low Complexity: 1 Low G-Codes:     Halimah Bewick A. Brett Albino, M.S., OTR/L Pager: [phone]  Gaye Alken 09/14/2017, 9:13 AM

## 2017-09-14 NOTE — Discharge Instructions (Signed)

## 2017-09-14 NOTE — Progress Notes (Signed)
Physical Therapy Treatment Patient Details Name: Tracy [name] MRN: [id number] DOB: 02-Oct-1929 Today's Date: 09/14/2017    History of Present Illness Pt is an 84 y/o male s/p L3-S1 laminectomy. PMH includes skin cancer, DM, HTN, and MI.     PT Comments    Pt progressing towards physical therapy goals. Was able to perform transfers and ambulation with gross min guard assist for balance support and safety. Pt occasionally with lateral LOB but feel he is rushing mobility. No unsteadiness noted when pt is taking his time and moving more cautiously. Will continue to follow and progress as able per POC.    Follow Up Recommendations  No PT follow up;Supervision - Intermittent     Equipment Recommendations  3in1 (PT)    Recommendations for Other Services       Precautions / Restrictions Precautions Precautions: Back Precaution Booklet Issued: Yes (comment) Precaution Comments: Reviewed back precautions with pt.  Restrictions Weight Bearing Restrictions: No    Mobility  Bed Mobility Overal bed mobility: Needs Assistance Bed Mobility: Rolling;Sidelying to Sit;Sit to Sidelying Rolling: Supervision Sidelying to sit: Supervision     Sit to sidelying: Supervision General bed mobility comments: Spent a significant amount of time on bed mobility for technique. Pt required multimodal cues for completion of log roll without twisting or bending in back.   Transfers Overall transfer level: Needs assistance Equipment used: None Transfers: Sit to/from Stand Sit to Stand: Min guard         General transfer comment: Practiced x4 for technique and to minimize trunk flexion. Improved by end of transfer training, however still slightly flexed with power-up to full stand.   Ambulation/Gait Ambulation/Gait assistance: Min guard;Supervision Ambulation Distance (Feet): 300 Feet Assistive device: None Gait Pattern/deviations: Step-through pattern;Decreased stride length Gait velocity:  Decreased Gait velocity interpretation: Below normal speed for age/gender General Gait Details: Pt with quick cadence and appears to be rushing. VC's to slow down as noted occasional sideways LOB. No assist to recover however.    Stairs Stairs: Yes   Stair Management: One rail Left;Step to pattern;Forwards Number of Stairs: 10 General stair comments: VC's for sequencing and general safety with stair negotiation.   Wheelchair Mobility    Modified Rankin (Stroke Patients Only)       Balance Overall balance assessment: Needs assistance Sitting-balance support: No upper extremity supported;Feet supported Sitting balance-Leahy Scale: Good     Standing balance support: No upper extremity supported;During functional activity Standing balance-Leahy Scale: Fair Standing balance comment: Able to maintain static standing without UE support.                             Cognition Arousal/Alertness: Awake/alert Behavior During Therapy: WFL for tasks assessed/performed Overall Cognitive Status: Within Functional Limits for tasks assessed                                        Exercises      General Comments        Pertinent Vitals/Pain Pain Assessment: Faces Faces Pain Scale: Hurts little more Pain Location: back  Pain Descriptors / Indicators: Guarding;Grimacing;Tightness Pain Intervention(s): Limited activity within patient's tolerance;Monitored during session;Repositioned    Home Living                      Prior Function  PT Goals (current goals can now be found in the care plan section) Acute Rehab PT Goals Patient Stated Goal: to go home  PT Goal Formulation: With patient Time For Goal Achievement: 09/20/17 Potential to Achieve Goals: Good Progress towards PT goals: Progressing toward goals    Frequency    Min 5X/week      PT Plan Current plan remains appropriate    Co-evaluation               AM-PAC PT "6 Clicks" Daily Activity  Outcome Measure  Difficulty turning over in bed (including adjusting bedclothes, sheets and blankets)?: A Little Difficulty moving from lying on back to sitting on the side of the bed? : A Little Difficulty sitting down on and standing up from a chair with arms (e.g., wheelchair, bedside commode, etc,.)?: None Help needed moving to and from a bed to chair (including a wheelchair)?: A Little Help needed walking in hospital room?: A Little Help needed climbing 3-5 steps with a railing? : A Little 6 Click Score: 19    End of Session Equipment Utilized During Treatment: Gait belt Activity Tolerance: Patient tolerated treatment well Patient left: in bed;with call bell/phone within reach;with family/visitor present Nurse Communication: Mobility status PT Visit Diagnosis: Other abnormalities of gait and mobility (R26.89);Pain Pain - part of body: (back)     Time: 0750-0809 PT Time Calculation (min) (ACUTE ONLY): 19 min  Charges:  $Gait Training: 8-22 mins                    G Codes:       Rolinda Roan, PT, DPT Acute Rehabilitation Services Pager: [phone]    Thelma Comp 09/14/2017, 8:23 AM

## 2017-09-14 NOTE — Discharge Summary (Signed)
Physician Discharge Summary  Patient ID: Tracy [name] MRN: [id number] DOB/AGE: [birth year] 84 y.o.  Admit date: 09/13/2017 Discharge date: 09/14/2017  Admission Diagnoses: severe central and foraminal spinal stenosis L3-4 with bilateral L3 and L4 radiculopathies. spinal stenosis on the left at L4-5 with foraminal stenosis and left L4 and L5 radiculopathies. severe spinal stenosis L5-S1 on the left with left S1 radiculopathy  Discharge Diagnoses: same    Discharged Condition: good  Hospital Course: The patient was admitted on 09/13/2017 and taken to the operating room where the patient underwent bilat decompressive lami L3-4, left sided decompressive lam L4-5 and L5-S1. The patient tolerated the procedure well and was taken to the recovery room and then to the floor in stable condition. The hospital course was routine. There were no complications. The wound remained clean dry and intact. Pt had appropriate back soreness. No complaints of new pain or new N/T/W. The patient remained afebrile with stable vital signs, and tolerated a regular diet. The patient continued to increase activities, and pain was well controlled with oral pain medications.   Consults: None  Significant Diagnostic Studies:  Results for orders placed or performed during the hospital encounter of 09/13/17  Glucose, capillary  Result Value Ref Range   Glucose-Capillary 114 (H) 65 - 99 mg/dL   Comment 1 Notify RN   Glucose, capillary  Result Value Ref Range   Glucose-Capillary 122 (H) 65 - 99 mg/dL  Glucose, capillary  Result Value Ref Range   Glucose-Capillary 179 (H) 65 - 99 mg/dL  Glucose, capillary  Result Value Ref Range   Glucose-Capillary 322 (H) 65 - 99 mg/dL  Glucose, capillary  Result Value Ref Range   Glucose-Capillary 273 (H) 65 - 99 mg/dL   Comment 1 Notify RN    Comment 2 Document in Chart     Dg Lumbar Spine 1 View  Result Date: 09/13/2017 CLINICAL DATA:  L3-4, L4-5, L5-S1 laminectomy. EXAM:  LUMBAR SPINE - 1 VIEW COMPARISON:  06/22/2017 FINDINGS: Cross-table portable view of the lumbar spine obtained at 0905 hours. Using caudal most lumbar type vertebral body as the L5 level, this shows a last full open disc space at L5-S1 with loss of disc height at L5-S1. Soft tissue retractors are noted in the lower back, overlying the L5 spinous process. Surgical probe is positioned with the tip overlying a position just posterior to the L4-5 facets. Surgical sponges evident in the operative bed. IMPRESSION: Intraoperative localization. Electronically Signed   By: Misty Stanley M.D.   On: 09/13/2017 12:44    Antibiotics:  Anti-infectives (From admission, onward)   Start     Dose/Rate Route Frequency Ordered Stop   09/13/17 1630  ceFAZolin (ANCEF) IVPB 2g/100 mL premix     2 g 200 mL/hr over 30 Minutes Intravenous Every 8 hours 09/13/17 1236 09/14/17 0111   09/13/17 0902  bacitracin 50,000 Units in sodium chloride irrigation 0.9 % 500 mL irrigation  Status:  Discontinued       As needed 09/13/17 0902 09/13/17 1104   09/13/17 0642  ceFAZolin (ANCEF) IVPB 2g/100 mL premix     2 g 200 mL/hr over 30 Minutes Intravenous On call to O.R. 09/13/17 0272 09/13/17 0840      Discharge Exam: Blood pressure 117/62, pulse 82, temperature 97.8 F (36.6 C), temperature source Oral, resp. rate 18, height '5\' 10"'$  (1.778 m), weight 81.5 kg (179 lb 10.8 oz), SpO2 96 %. Neurologic: Grossly normal Ambulating and voiding well  Discharge Medications:   Allergies  as of 09/14/2017      Reactions   Tetracyclines & Related    UNSPECIFIED REACTION       Medication List    TAKE these medications   ACCU-CHEK FASTCLIX LANCETS Misc CHECK  BLOOD  SUGAR TWICE DAILY   ACCU-CHEK NANO SMARTVIEW w/Device Kit USE AS DIRECTED   allopurinol 100 MG tablet Commonly known as:  ZYLOPRIM Take 1 tablet (100 mg total) by mouth 2 (two) times daily.   amLODipine 5 MG tablet Commonly known as:  NORVASC Take 1 tablet (5 mg  total) by mouth daily.   aspirin 81 MG tablet Take 81 mg by mouth.   atorvastatin 10 MG tablet Commonly known as:  LIPITOR TAKE 1 TABLET DAILY AT 6 PM.   B12 FOLATE PO Take 1 tablet by mouth daily.   CENTRUM SILVER 50+MEN Tabs Take 1 tablet by mouth daily.   cyclobenzaprine 10 MG tablet Commonly known as:  FLEXERIL Take 1 tablet (10 mg total) by mouth 3 (three) times daily as needed for muscle spasms.   DAILY VITAMINS tablet Take by mouth.   dorzolamide 2 % ophthalmic solution Commonly known as:  TRUSOPT   doxazosin 4 MG tablet Commonly known as:  CARDURA Take 1 tablet (4 mg total) by mouth at bedtime.   finasteride 5 MG tablet Commonly known as:  PROSCAR Take 1 tablet (5 mg total) by mouth at bedtime.   glipiZIDE 10 MG tablet Commonly known as:  GLUCOTROL TAKE 1 TABLET TWICE DAILY BEFORE MEALS   glucose blood test strip Commonly known as:  COOL BLOOD GLUCOSE TEST STRIPS Use as instructed   HYDROcodone-acetaminophen 5-325 MG tablet Commonly known as:  NORCO/VICODIN Take 1 tablet by mouth every 4 (four) hours as needed for moderate pain.   Insulin Pen Needle 31G X 8 MM Misc Commonly known as:  B-D ULTRAFINE III SHORT PEN Use as directed with Insulin pen.   LANTUS SOLOSTAR 100 UNIT/ML Solostar Pen Generic drug:  Insulin Glargine Inject 50 Units into the skin at bedtime.   lisinopril 40 MG tablet Commonly known as:  PRINIVIL,ZESTRIL Take 1 tablet (40 mg total) by mouth daily.   meloxicam 7.5 MG tablet Commonly known as:  MOBIC Take 1 tablet (7.5 mg total) by mouth daily as needed.   metFORMIN 1000 MG tablet Commonly known as:  GLUCOPHAGE Take 1 tablet (1,000 mg total) by mouth 2 (two) times daily with a meal.   NEEDLE (DISP) 14 G 14G X 1" Misc   omega-3 fish oil 1000 MG Caps capsule Commonly known as:  MAXEPA Take 1 capsule by mouth daily.   ranitidine 150 MG capsule Commonly known as:  ZANTAC Take 1 capsule (150 mg total) by mouth daily as  needed.   timolol 0.5 % ophthalmic solution Commonly known as:  TIMOPTIC Frequency:QD   Dosage:0.0     Instructions:  Note:Dose: 0.5 %, OS   vitamin B-12 500 MCG tablet Commonly known as:  CYANOCOBALAMIN Take 1 tablet by mouth daily.   Vitamin D 2000 units tablet Take 2,000 Units by mouth daily.       Disposition: home   Final Dx: same as admitting       Signed: Eleonore Chiquito 09/14/2017, 7:31 AM

## 2017-09-19 ENCOUNTER — Other Ambulatory Visit: Payer: Self-pay

## 2017-09-19 ENCOUNTER — Emergency Department
Admission: EM | Admit: 2017-09-19 | Discharge: 2017-09-19 | Disposition: A | Discharge status: Home / Self Care | Payer: Medicare HMO | Attending: Emergency Medicine | Admitting: Emergency Medicine

## 2017-09-19 DIAGNOSIS — I252 Old myocardial infarction: Secondary | ICD-10-CM | POA: Insufficient documentation

## 2017-09-19 DIAGNOSIS — Z87891 Personal history of nicotine dependence: Secondary | ICD-10-CM | POA: Diagnosis not present

## 2017-09-19 DIAGNOSIS — Z79899 Other long term (current) drug therapy: Secondary | ICD-10-CM | POA: Insufficient documentation

## 2017-09-19 DIAGNOSIS — K5900 Constipation, unspecified: Secondary | ICD-10-CM | POA: Diagnosis not present

## 2017-09-19 DIAGNOSIS — E119 Type 2 diabetes mellitus without complications: Secondary | ICD-10-CM | POA: Diagnosis not present

## 2017-09-19 DIAGNOSIS — R339 Retention of urine, unspecified: Secondary | ICD-10-CM | POA: Diagnosis not present

## 2017-09-19 DIAGNOSIS — Z7982 Long term (current) use of aspirin: Secondary | ICD-10-CM | POA: Diagnosis not present

## 2017-09-19 DIAGNOSIS — I10 Essential (primary) hypertension: Secondary | ICD-10-CM | POA: Insufficient documentation

## 2017-09-19 DIAGNOSIS — Z794 Long term (current) use of insulin: Secondary | ICD-10-CM | POA: Insufficient documentation

## 2017-09-19 LAB — URINALYSIS, ROUTINE W REFLEX MICROSCOPIC
Bilirubin Urine: NEGATIVE
Glucose, UA: 150 mg/dL — AB
Hgb urine dipstick: NEGATIVE
Ketones, ur: NEGATIVE mg/dL
Leukocytes, UA: NEGATIVE
Nitrite: NEGATIVE
Protein, ur: NEGATIVE mg/dL
Specific Gravity, Urine: 1.018 (ref 1.005–1.030)
pH: 5 (ref 5.0–8.0)

## 2017-09-19 MED ORDER — SORBITOL 70 % SOLN
960.0000 mL | TOPICAL_OIL | Freq: Once | ORAL | Status: AC
  Administered 2017-09-19: 960 mL via RECTAL
  Filled 2017-09-19: qty 473

## 2017-09-19 NOTE — Discharge Instructions (Signed)
Please call urology to arrange a follow-up appointment in 1 week for reevaluation and possible catheter removal.  If you are unable to see urology please follow-up with your doctor in 1 week.  Return to the emergency department for any fever, abdominal pain, weakness or numbness of any leg or arm, or any other symptom personally concerning to yourself.

## 2017-09-19 NOTE — ED Notes (Signed)
Pt bladder scanned for 607cc of urine.

## 2017-09-19 NOTE — ED Triage Notes (Signed)
First nurse note:  Pt to ed with c/o constipation and ? Impaction.  Pt reports laminectomy about 1 week ago with dr Saintclair Halsted in [place].  Reports unable to have BM in several days. Pt walking with walker at triage accompanied by wife.

## 2017-09-19 NOTE — ED Triage Notes (Addendum)
Pt reporting constipation X 1 week. Pt recent back surgery. Pt states he is uncomfortable sitting down. Pt has tried Miralax without relief. Pt taking oxycodone for pain.

## 2017-09-19 NOTE — ED Provider Notes (Signed)
[facility] Emergency Department Provider Note  Time seen: 5:46 PM  I have reviewed the triage vital signs and the nursing notes.   HISTORY  Chief Complaint Constipation    HPI Tracy [name] is a 84 y.o. male with a past medical history of diabetes, gastric reflux, hypertension, prior MI, presents to the emergency department with constipation.  According to the patient he had a laminectomy in his lumbar spine approximately 6 days ago.  Patient has been taking oxycodone, last bowel movement was the day before the surgery.  States he tried taking Dulcolax and MiraLAX last night, was not taking any stool softener or laxative prior to last night.  Patient states today he was having a significant difficulty urinating, he called his doctor and they recommended he go to the emergency department for evaluation.  Here the patient appears well, no distress.  Complaining mostly of suprapubic pain and distention.  Denies any fever, cough or congestion at this time.  Denies any vomiting, diarrhea, dysuria.  Patient states he was able to urinate only a very small amount this morning and is not been able to urinate since.  Patient does describe some weakness, but states it is generalized.  Denies any numbness in his legs.  Denies any focal weakness in his legs.   Past Medical History:  Diagnosis Date  . Arthritis   . Cancer [facility])    SKIN CANCER  . Diabetes mellitus without complication ([place])   . GERD (gastroesophageal reflux disease)   . Hypertension   . Myocardial infarction [facility])    08/27/1979    Patient Active Problem List   Diagnosis Date Noted  . Spinal stenosis of lumbar region 09/13/2017  . Spinal stenosis of lumbar region with neurogenic claudication 05/03/2017  . Type 2 diabetes mellitus without complication, with long-term current use of insulin ([place]) 05/03/2017  . Hyperkalemia 04/18/2017  . Medicare annual wellness visit, subsequent 11/28/2016  . Left hip  pain 07/28/2016  . ALT (SGPT) level raised 07/13/2015  . Abdominal discomfort in right lower quadrant 07/13/2015  . At risk for falling 07/13/2015  . Arthritis of hand, degenerative 07/13/2015  . Gout 07/13/2015  . Glaucoma 07/13/2015  . Diabetic neuropathy ([place]) 07/13/2015  . Hypertension 04/09/2015  . Dyslipidemia 04/09/2015  . Hypertriglyceridemia 04/09/2015  . Benign fibroma of prostate 04/09/2015  . Acid reflux 04/09/2015  . 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 History of MI (myocardial infarction) 08/27/1979    Past Surgical History:  Procedure Laterality Date  . CATARACT EXTRACTION W/ INTRAOCULAR LENS  IMPLANT, BILATERAL    . CIRCUMCISION    . GANGLION CYST EXCISION     LEFT HAND  . LUMBAR LAMINECTOMY/DECOMPRESSION MICRODISCECTOMY  09/13/2017   Procedure: BILATERAL Lumbar three-four , Left  Lumbar four-five  AND Left  L5-S1, LAMINECTOMY/FORAMINOTOMY;  Surgeon: [name], MD;  Location: [place];  Service: Neurosurgery;;  . MELANOMA EXCISION  07/2011  . TONSILLECTOMY    . VASECTOMY      Prior to Admission medications   Medication Sig Start Date End Date Taking? Authorizing Provider  ACCU-CHEK FASTCLIX LANCETS MISC CHECK  BLOOD  SUGAR TWICE DAILY 05/15/15   Ashok Norris, MD  allopurinol (ZYLOPRIM) 100 MG tablet Take 1 tablet (100 mg total) by mouth 2 (two) times daily. 05/03/17   Hubbard Hartshorn, FNP  amLODipine (NORVASC) 5 MG tablet Take 1 tablet (5 mg total) by mouth daily. 05/03/17   Hubbard Hartshorn, FNP  aspirin 81 MG tablet  Take 81 mg by mouth. 10/08/10   [provider]  atorvastatin (LIPITOR) 10 MG tablet TAKE 1 TABLET DAILY AT 6 PM. 05/03/17   Hubbard Hartshorn, FNP  Blood Glucose Monitoring Suppl (ACCU-CHEK NANO SMARTVIEW) W/DEVICE KIT USE AS DIRECTED 05/15/15   Ashok Norris, MD  Cholecalciferol (VITAMIN D) 2000 units tablet Take 2,000 Units by mouth daily. 02/02/17   [provider]  Cobalamine Combinations (B12 FOLATE PO) Take 1 tablet by  mouth daily. 03/21/17   [provider]  cyclobenzaprine (FLEXERIL) 10 MG tablet Take 1 tablet (10 mg total) by mouth 3 (three) times daily as needed for muscle spasms. 09/14/17   Meyran, Ocie Cornfield, NP  dorzolamide (TRUSOPT) 2 % ophthalmic solution  02/28/15   [provider]  doxazosin (CARDURA) 4 MG tablet Take 1 tablet (4 mg total) by mouth at bedtime. 05/03/17   Hubbard Hartshorn, FNP  finasteride (PROSCAR) 5 MG tablet Take 1 tablet (5 mg total) by mouth at bedtime. 05/03/17   Hubbard Hartshorn, FNP  glipiZIDE (GLUCOTROL) 10 MG tablet TAKE 1 TABLET TWICE DAILY BEFORE MEALS 05/03/17   Hubbard Hartshorn, FNP  glucose blood (COOL BLOOD GLUCOSE TEST STRIPS) test strip Use as instructed 03/03/15   Roselee Nova, MD  HYDROcodone-acetaminophen (NORCO/VICODIN) 5-325 MG tablet Take 1 tablet by mouth every 4 (four) hours as needed for moderate pain. 09/14/17 09/14/18  Meyran, Ocie Cornfield, NP  Insulin Glargine (LANTUS SOLOSTAR) 100 UNIT/ML Solostar Pen Inject 50 Units into the skin at bedtime.  11/26/13   [provider]  Insulin Pen Needle (B-D ULTRAFINE III SHORT PEN) 31G X 8 MM MISC Use as directed with Insulin pen. 07/03/17   Roselee Nova, MD  lisinopril (PRINIVIL,ZESTRIL) 40 MG tablet Take 1 tablet (40 mg total) by mouth daily. 08/07/17   Roselee Nova, MD  meloxicam (MOBIC) 7.5 MG tablet Take 1 tablet (7.5 mg total) by mouth daily as needed. 04/20/17   Hubbard Hartshorn, FNP  metFORMIN (GLUCOPHAGE) 1000 MG tablet Take 1 tablet (1,000 mg total) by mouth 2 (two) times daily with a meal. 05/03/17   Hubbard Hartshorn, FNP  Multiple Vitamin (DAILY VITAMINS) tablet Take by mouth. 10/08/10   [provider]  Multiple Vitamins-Minerals (CENTRUM SILVER 50+MEN) TABS Take 1 tablet by mouth daily.    [provider]  NEEDLE, DISP, 14 G 14G X 1" [place]  12/29/11   [provider]  omega-3 fish oil (MAXEPA) 1000 MG CAPS capsule Take 1 capsule by mouth daily.    [provider]  ranitidine (ZANTAC) 150 MG capsule Take 1 capsule (150 mg total) by mouth daily as needed. 08/11/17   Roselee Nova, MD  timolol (TIMOPTIC) 0.5 % ophthalmic solution Frequency:QD   Dosage:0.0     Instructions:  Note:Dose: 0.5 %, OS 01/17/11   [provider]  vitamin B-12 (CYANOCOBALAMIN) 500 MCG tablet Take 1 tablet by mouth daily. 06/15/17   [provider]    Allergies  Allergen Reactions  . Tetracyclines & Related     UNSPECIFIED REACTION     Family History  Problem Relation Age of Onset  . Heart disease Mother   . Diabetes Mother        type 2  . Heart disease Brother     Social History Social History   Tobacco Use  . Smoking status: Former Smoker    Types: Cigarettes  . Smokeless tobacco: Never Used  Substance Use Topics  . Alcohol use: Yes    Alcohol/week: 0.0 oz    Comment: occasional  . Drug use: No    Review of Systems Constitutional: Negative for fever. Eyes: Negative for visual complaints. ENT: Negative for congestion Cardiovascular: Negative for chest pain. Respiratory: Negative for shortness of breath. Gastrointestinal: Negative for abdominal pain Genitourinary: Negative for dysuria.  Musculoskeletal: Negative for back pain. Skin: Negative for rash. Neurological: Negative for headache All other ROS negative  ____________________________________________   PHYSICAL EXAM:  VITAL SIGNS: ED Triage Vitals  Enc Vitals Group     BP 09/19/17 1606 134/63     Pulse Rate 09/19/17 1606 92     Resp --      Temp 09/19/17 1606 98 F (36.7 C)     Temp Source 09/19/17 1606 Oral     SpO2 09/19/17 1606 99 %     Weight 09/19/17 1606 180 lb (81.6 kg)     Height 09/19/17 1606 5' 10"  (1.778 m)     Head Circumference --      Peak Flow --      Pain Score 09/19/17 1611 9     Pain Loc --      Pain Edu? --      Excl. in [place]? --    Constitutional: Alert and oriented. Well appearing and in no distress. Eyes: Normal exam ENT    Head: Normocephalic and atraumatic.   Mouth/Throat: Mucous membranes are moist. Cardiovascular: Normal rate, regular rhythm.  Respiratory: Normal respiratory effort without tachypnea nor retractions. Breath sounds are clear  Gastrointestinal: Soft, moderate suprapubic tenderness to palpation with moderate distention. Musculoskeletal: Nontender with normal range of motion in all extremities.  Neurologic:  Normal speech and language. No gross focal neurologic deficits Skin:  Skin is warm, dry and intact.  Psychiatric: Mood and affect are normal.  ____________________________________________   INITIAL IMPRESSION / ASSESSMENT AND PLAN / ED COURSE  Pertinent labs & imaging results that were available during my care of the patient were reviewed by me and considered in my medical decision making (see chart for details).  Patient presents to the emergency department with complaints of constipation and now urinary retention.  Patient states generalized weakness since the surgery but denies any focal weakness in his legs, denies any sensory deficits in his legs or numbness/tingling.  Patient able to ambulate well in the emergency department.  Differential would include medication induced constipation, urinary retention, urinary tract infection.  Patient's bladder scan shows greater than 600 cc of urine in his bladder, patient does not feel that he can urinate.  We will place a Foley catheter with a leg bag.  I will perform a rectal examination to check for fecal impaction and likely enema following for symptom relief.  At this time the patient has great strength in his legs, ambulates without difficulty, no weakness or numbness on exam.  If the patient has normal rectal tone I do not believe that he would be at risk for cauda equina.  Patient has normal rectal tone on exam.  Hard stool in the rectal vault removed with digital disimpaction.  We will perform an enema, and then allow the patient to  attempt to urinate after he has a bowel movement.  If the patient is still unable to urinate we will then place a Foley catheter.  Patient reports a "massive" bowel movement after the enema.  Still unable to urinate.  Continues with fullness/lower abdominal discomfort.  We will place a  Foley catheter and leg bag.  Patient agreeable to plan of care.  Patient still unable to urinate, Foley catheter placed with nearly 700 cc of urine drained, patient states significant relief.  Discussed with the patient to continue Dulcolax stool softener while taking the pain medication.  Follow-up with urology in 7 days for Foley catheter removal.  Patient agreeable to this plan of care.  ____________________________________________   FINAL CLINICAL IMPRESSION(S) / ED DIAGNOSES  constipation Acute urinary retention   Harvest Dark, MD 09/19/17 2036

## 2017-09-19 NOTE — ED Notes (Signed)
Spoke with Dr. Burlene Arnt about patient, no orders recived.

## 2017-09-28 ENCOUNTER — Ambulatory Visit: Payer: Medicare HMO | Admitting: Urology

## 2017-09-28 ENCOUNTER — Encounter: Payer: Self-pay | Admitting: Urology

## 2017-09-28 VITALS — BP 129/73 | HR 101 | Ht 70.0 in | Wt 180.0 lb

## 2017-09-28 DIAGNOSIS — N138 Other obstructive and reflux uropathy: Secondary | ICD-10-CM | POA: Diagnosis not present

## 2017-09-28 DIAGNOSIS — N401 Enlarged prostate with lower urinary tract symptoms: Secondary | ICD-10-CM

## 2017-09-28 DIAGNOSIS — R339 Retention of urine, unspecified: Secondary | ICD-10-CM | POA: Diagnosis not present

## 2017-09-28 NOTE — Progress Notes (Signed)
09/28/2017 8:36 AM   [name] 05-01-1930 [id number]  Referring provider: Roselee Nova, MD [address] [place], [place] [address]  Chief Complaint  Patient presents with  . Urinary Retention    New Patient    HPI: Tracy [name] is an 84 year old male who presents for evaluation of urinary retention.  He had lumbar spine surgery on 09/13/2016.  He was discharged on 1/3 and was voiding without problems.  He developed significant constipation and subsequent urinary retention and was seen in the ED on 09/19/2017.  Bladder scan showed a volume of 700 mL and a Foley catheter was placed.  Prior to his surgery he had no bothersome lower urinary tract symptoms.  His constipation has resolved.  He is on doxazosin and finasteride for BPH.   PMH: Past Medical History:  Diagnosis Date  . Arthritis   . Cancer [facility])    SKIN CANCER  . Diabetes mellitus without complication ([place])   . GERD (gastroesophageal reflux disease)   . Hypertension   . Myocardial infarction [facility])    08/27/1979    Surgical History: Past Surgical History:  Procedure Laterality Date  . CATARACT EXTRACTION W/ INTRAOCULAR LENS  IMPLANT, BILATERAL    . CIRCUMCISION    . GANGLION CYST EXCISION     LEFT HAND  . LUMBAR LAMINECTOMY/DECOMPRESSION MICRODISCECTOMY  09/13/2017   Procedure: BILATERAL Lumbar three-four , Left  Lumbar four-five  AND Left  L5-S1, LAMINECTOMY/FORAMINOTOMY;  Surgeon: [name], MD;  Location: [place];  Service: Neurosurgery;;  . MELANOMA EXCISION  07/2011  . TONSILLECTOMY    . VASECTOMY      Home Medications:  Allergies as of 09/28/2017      Reactions   Tetracyclines & Related    UNSPECIFIED REACTION       Medication List        Accurate as of 09/28/17  8:36 AM. Always use your most recent med list.          ACCU-CHEK FASTCLIX LANCETS Misc CHECK  BLOOD  SUGAR TWICE DAILY   ACCU-CHEK NANO SMARTVIEW w/Device Kit USE AS DIRECTED   allopurinol 100 MG  tablet Commonly known as:  ZYLOPRIM Take 1 tablet (100 mg total) by mouth 2 (two) times daily.   amLODipine 5 MG tablet Commonly known as:  NORVASC Take 1 tablet (5 mg total) by mouth daily.   aspirin 81 MG tablet Take 81 mg by mouth.   atorvastatin 10 MG tablet Commonly known as:  LIPITOR TAKE 1 TABLET DAILY AT 6 PM.   B12 FOLATE PO Take 1 tablet by mouth daily.   CENTRUM SILVER 50+MEN Tabs Take 1 tablet by mouth daily.   cyclobenzaprine 10 MG tablet Commonly known as:  FLEXERIL Take 1 tablet (10 mg total) by mouth 3 (three) times daily as needed for muscle spasms.   DAILY VITAMINS tablet Take by mouth.   dorzolamide 2 % ophthalmic solution Commonly known as:  TRUSOPT   doxazosin 4 MG tablet Commonly known as:  CARDURA Take 1 tablet (4 mg total) by mouth at bedtime.   finasteride 5 MG tablet Commonly known as:  PROSCAR Take 1 tablet (5 mg total) by mouth at bedtime.   glipiZIDE 10 MG tablet Commonly known as:  GLUCOTROL TAKE 1 TABLET TWICE DAILY BEFORE MEALS   glucose blood test strip Commonly known as:  COOL BLOOD GLUCOSE TEST STRIPS Use as instructed   HYDROcodone-acetaminophen 5-325 MG tablet Commonly known as:  NORCO/VICODIN Take 1  tablet by mouth every 4 (four) hours as needed for moderate pain.   Insulin Pen Needle 31G X 8 MM Misc Commonly known as:  B-D ULTRAFINE III SHORT PEN Use as directed with Insulin pen.   LANTUS SOLOSTAR 100 UNIT/ML Solostar Pen Generic drug:  Insulin Glargine Inject 50 Units into the skin at bedtime.   lisinopril 40 MG tablet Commonly known as:  PRINIVIL,ZESTRIL Take 1 tablet (40 mg total) by mouth daily.   meloxicam 7.5 MG tablet Commonly known as:  MOBIC Take 1 tablet (7.5 mg total) by mouth daily as needed.   metFORMIN 1000 MG tablet Commonly known as:  GLUCOPHAGE Take 1 tablet (1,000 mg total) by mouth 2 (two) times daily with a meal.   NEEDLE (DISP) 14 G 14G X 1" Misc   omega-3 fish oil 1000 MG Caps  capsule Commonly known as:  MAXEPA Take 1 capsule by mouth daily.   ranitidine 150 MG capsule Commonly known as:  ZANTAC Take 1 capsule (150 mg total) by mouth daily as needed.   timolol 0.5 % ophthalmic solution Commonly known as:  TIMOPTIC Frequency:QD   Dosage:0.0     Instructions:  Note:Dose: 0.5 %, OS   vitamin B-12 500 MCG tablet Commonly known as:  CYANOCOBALAMIN Take 1 tablet by mouth daily.   Vitamin D 2000 units tablet Take 2,000 Units by mouth daily.       Allergies:  Allergies  Allergen Reactions  . Tetracyclines & Related     UNSPECIFIED REACTION     Family History: Family History  Problem Relation Age of Onset  . Heart disease Mother   . Diabetes Mother        type 2  . Heart disease Brother     Social History:  reports that he has quit smoking. His smoking use included cigarettes. he has never used smokeless tobacco. He reports that he drinks alcohol. He reports that he does not use drugs.  ROS: UROLOGY Frequent Urination?: No Hard to postpone urination?: No Burning/pain with urination?: No Get up at night to urinate?: No Leakage of urine?: No Urine stream starts and stops?: No Trouble starting stream?: No Do you have to strain to urinate?: No Blood in urine?: No Urinary tract infection?: No Sexually transmitted disease?: No Injury to kidneys or bladder?: No Painful intercourse?: No Weak stream?: No Erection problems?: No Penile pain?: No  Gastrointestinal Nausea?: No Vomiting?: No Indigestion/heartburn?: No Diarrhea?: No Constipation?: Yes  Constitutional Fever: No Night sweats?: No Weight loss?: No Fatigue?: No  Skin Skin rash/lesions?: No Itching?: No  Eyes Blurred vision?: No Double vision?: No  Ears/Nose/Throat Sore throat?: No Sinus problems?: No  Hematologic/Lymphatic Swollen glands?: No Easy bruising?: No  Cardiovascular Leg swelling?: No Chest pain?: No  Respiratory Cough?: No Shortness of breath?:  No  Endocrine Excessive thirst?: No  Musculoskeletal Back pain?: No Joint pain?: No  Neurological Headaches?: No Dizziness?: No  Psychologic Depression?: No Anxiety?: No  Physical Exam: BP 129/73   Pulse (!) 101   Ht 5' 10"  (1.778 m)   Wt 180 lb (81.6 kg)   BMI 25.83 kg/m   Constitutional:  Alert and oriented, No acute distress. HEENT: [place] AT, moist mucus membranes.  Trachea midline, no masses. Cardiovascular: No clubbing, cyanosis, or edema. Respiratory: Normal respiratory effort, no increased work of breathing. GI: Abdomen is soft, nontender, nondistended, no abdominal masses GU: No CVA tenderness.  Skin: No rashes, bruises or suspicious lesions. Lymph: No cervical or inguinal adenopathy. Neurologic: Grossly  intact, no focal deficits, moving all 4 extremities. Psychiatric: Normal mood and affect.  Laboratory Data: Lab Results  Component Value Date   WBC 6.7 09/07/2017   HGB 13.7 09/07/2017   HCT 39.2 09/07/2017   MCV 96.6 09/07/2017   PLT 135 (L) 09/07/2017    Lab Results  Component Value Date   CREATININE 0.98 09/07/2017    Lab Results  Component Value Date   PSA1 0.4 10/15/2015    No results found for: TESTOSTERONE  Lab Results  Component Value Date   HGBA1C 5.4 06/27/2017    Urinalysis Lab Results  Component Value Date   APPEARANCEUR CLEAR (A) 09/19/2017   LEUKOCYTESUR NEGATIVE 09/19/2017   PROTEINUR NEGATIVE 09/19/2017   GLUCOSEU 150 (A) 09/19/2017   BILIRUBINUR NEGATIVE 09/19/2017   NITRITE NEGATIVE 09/19/2017    Lab Results  Component Value Date   LABMICR 36.7 10/15/2015     Assessment & Plan:   84 year old male with urinary retention precipitated by constipation.  He had no voiding problems before or immediately after his lumbar spinal surgery.  His Foley catheter was removed and he was instructed to call should he have voiding difficulty or recurrent retention.  He will otherwise follow-up in approximately 2 weeks for a bladder  scan for PVR and DRE.  He was given Cipro 500 mg prior to catheter removal.    Abbie Sons, MD  [place] [address] [place], [place] [address] [phone]

## 2017-09-28 NOTE — Progress Notes (Signed)
Catheter Removal  Patient is present today for a catheter removal.  8ml of water was drained from the balloon. A 16FR foley cath was removed from the bladder no complications were noted . Patient tolerated well.  Preformed by: Sarah Watts, CMA  

## 2017-10-02 ENCOUNTER — Other Ambulatory Visit: Payer: Self-pay | Admitting: Family Medicine

## 2017-10-05 DIAGNOSIS — M545 Low back pain: Secondary | ICD-10-CM | POA: Diagnosis not present

## 2017-10-06 ENCOUNTER — Encounter: Payer: Self-pay | Admitting: Family Medicine

## 2017-10-06 ENCOUNTER — Ambulatory Visit (INDEPENDENT_AMBULATORY_CARE_PROVIDER_SITE_OTHER): Payer: Medicare HMO | Admitting: Family Medicine

## 2017-10-06 VITALS — BP 128/74 | HR 87 | Temp 97.8°F | Resp 16 | Wt 173.5 lb

## 2017-10-06 DIAGNOSIS — Z794 Long term (current) use of insulin: Secondary | ICD-10-CM

## 2017-10-06 DIAGNOSIS — M1A0710 Idiopathic chronic gout, right ankle and foot, without tophus (tophi): Secondary | ICD-10-CM | POA: Diagnosis not present

## 2017-10-06 DIAGNOSIS — E785 Hyperlipidemia, unspecified: Secondary | ICD-10-CM

## 2017-10-06 DIAGNOSIS — E119 Type 2 diabetes mellitus without complications: Secondary | ICD-10-CM

## 2017-10-06 DIAGNOSIS — I10 Essential (primary) hypertension: Secondary | ICD-10-CM

## 2017-10-06 DIAGNOSIS — E781 Pure hyperglyceridemia: Secondary | ICD-10-CM | POA: Diagnosis not present

## 2017-10-06 LAB — POCT GLYCOSYLATED HEMOGLOBIN (HGB A1C): Hemoglobin A1C: 7.4

## 2017-10-06 MED ORDER — LISINOPRIL 40 MG PO TABS: 40.0000 mg | ORAL_TABLET | Freq: Every day | ORAL | 0 refills | Status: DC

## 2017-10-06 MED ORDER — ATORVASTATIN CALCIUM 10 MG PO TABS: ORAL_TABLET | ORAL | 1 refills | Status: DC

## 2017-10-06 MED ORDER — ALLOPURINOL 100 MG PO TABS: 100.0000 mg | ORAL_TABLET | Freq: Two times a day (BID) | ORAL | 1 refills | Status: DC

## 2017-10-06 MED ORDER — AMLODIPINE BESYLATE 5 MG PO TABS: 5.0000 mg | ORAL_TABLET | Freq: Every day | ORAL | 1 refills | Status: DC

## 2017-10-06 NOTE — Progress Notes (Signed)
Name: Tracy [name]   MRN: [id number]    DOB: 12/08/1929   Date:10/06/2017       Progress Note  Subjective  Chief Complaint  Chief Complaint  Patient presents with  . Diabetes  . Medication Refill    Diabetes  He presents for his follow-up diabetic visit. He has type 2 diabetes mellitus. His disease course has been stable. There are no hypoglycemic associated symptoms. Pertinent negatives for hypoglycemia include no headaches. (Has stopped Glipizide on Endocrinology recommendation. ) Pertinent negatives for diabetes include no blurred vision, no chest pain, no fatigue, no foot paresthesias, no polydipsia and no polyuria. Pertinent negatives for diabetic complications include no CVA. Current diabetic treatment includes oral agent (monotherapy) and insulin injections. His weight is stable. He is following a generally healthy diet. He rarely participates in exercise. He monitors blood glucose at home 1-2 x per day. His breakfast blood glucose range is generally 110-130 mg/dl.  Hypertension  This is a chronic problem. The problem is unchanged. The problem is controlled. Pertinent negatives include no blurred vision, chest pain, headaches, palpitations or shortness of breath. Past treatments include calcium channel blockers and ACE inhibitors. Hypertensive end-organ damage includes CAD/MI. There is no history of kidney disease or CVA.  Hyperlipidemia  This is a chronic problem. The problem is uncontrolled. Recent lipid tests were reviewed and are high. Pertinent negatives include no chest pain, myalgias or shortness of breath. Current antihyperlipidemic treatment includes statins.  Gout: Pt. Takes Allopurinol 100 mg BID for history of gout, no recent gout attacks, eating healthy otherwise and eats a lot of vegetables.     Past Medical History:  Diagnosis Date  . Arthritis   . Cancer (HCC)    SKIN CANCER  . Diabetes mellitus without complication (HCC)   . GERD (gastroesophageal reflux  disease)   . Hypertension   . Myocardial infarction (HCC)    08/27/1979    Past Surgical History:  Procedure Laterality Date  . CATARACT EXTRACTION W/ INTRAOCULAR LENS  IMPLANT, BILATERAL    . CIRCUMCISION    . GANGLION CYST EXCISION     LEFT HAND  . LUMBAR LAMINECTOMY/DECOMPRESSION MICRODISCECTOMY  09/13/2017   Procedure: BILATERAL Lumbar three-four , Left  Lumbar four-five  AND Left  L5-S1, LAMINECTOMY/FORAMINOTOMY;  Surgeon: Cram, Gary, MD;  Location[facility];  Service: Neurosurgery;;  . MELANOMA EXCISION  07/2011  . TONSILLECTOMY    . VASECTOMY      Family History  Problem Relation Age of Onset  . Heart disease Mother   . Diabetes Mother        type 2  . Heart disease Brother     Social History   Socioeconomic History  . Marital status: Married    Spouse name: Not on file  . Number of children: Not on file  . Years of education: Not on file  . Highest education level: Not on file  Social Needs  . Financial resource strain: Not on file  . Food insecurity - worry: Not on file  . Food insecurity - inability: Not on file  . Transportation needs - medical: Not on file  . Transportation needs - non-medical: Not on file  Occupational History  . Not on file  Tobacco Use  . Smoking status: Former Smoker    Types: Cigarettes  . Smokeless tobacco: Never Used  Substance and Sexual Activity  . Alcohol use: Yes    Alcohol/week: 0.0 oz    Comment: occasional  . Drug use: No  .   Name: Tracy [name]   MRN: [id number]    DOB: 12/08/1929   Date:10/06/2017       Progress Note  Subjective  Chief Complaint  Chief Complaint  Patient presents with  . Diabetes  . Medication Refill    Diabetes  He presents for his follow-up diabetic visit. He has type 2 diabetes mellitus. His disease course has been stable. There are no hypoglycemic associated symptoms. Pertinent negatives for hypoglycemia include no headaches. (Has stopped Glipizide on Endocrinology recommendation. ) Pertinent negatives for diabetes include no blurred vision, no chest pain, no fatigue, no foot paresthesias, no polydipsia and no polyuria. Pertinent negatives for diabetic complications include no CVA. Current diabetic treatment includes oral agent (monotherapy) and insulin injections. His weight is stable. He is following a generally healthy diet. He rarely participates in exercise. He monitors blood glucose at home 1-2 x per day. His breakfast blood glucose range is generally 110-130 mg/dl.  Hypertension  This is a chronic problem. The problem is unchanged. The problem is controlled. Pertinent negatives include no blurred vision, chest pain, headaches, palpitations or shortness of breath. Past treatments include calcium channel blockers and ACE inhibitors. Hypertensive end-organ damage includes CAD/MI. There is no history of kidney disease or CVA.  Hyperlipidemia  This is a chronic problem. The problem is uncontrolled. Recent lipid tests were reviewed and are high. Pertinent negatives include no chest pain, myalgias or shortness of breath. Current antihyperlipidemic treatment includes statins.  Gout: Pt. Takes Allopurinol 100 mg BID for history of gout, no recent gout attacks, eating healthy otherwise and eats a lot of vegetables.     Past Medical History:  Diagnosis Date  . Arthritis   . Cancer (HCC)    SKIN CANCER  . Diabetes mellitus without complication (HCC)   . GERD (gastroesophageal reflux  disease)   . Hypertension   . Myocardial infarction (HCC)    08/27/1979    Past Surgical History:  Procedure Laterality Date  . CATARACT EXTRACTION W/ INTRAOCULAR LENS  IMPLANT, BILATERAL    . CIRCUMCISION    . GANGLION CYST EXCISION     LEFT HAND  . LUMBAR LAMINECTOMY/DECOMPRESSION MICRODISCECTOMY  09/13/2017   Procedure: BILATERAL Lumbar three-four , Left  Lumbar four-five  AND Left  L5-S1, LAMINECTOMY/FORAMINOTOMY;  Surgeon: Cram, Gary, MD;  Location[facility];  Service: Neurosurgery;;  . MELANOMA EXCISION  07/2011  . TONSILLECTOMY    . VASECTOMY      Family History  Problem Relation Age of Onset  . Heart disease Mother   . Diabetes Mother        type 2  . Heart disease Brother     Social History   Socioeconomic History  . Marital status: Married    Spouse name: Not on file  . Number of children: Not on file  . Years of education: Not on file  . Highest education level: Not on file  Social Needs  . Financial resource strain: Not on file  . Food insecurity - worry: Not on file  . Food insecurity - inability: Not on file  . Transportation needs - medical: Not on file  . Transportation needs - non-medical: Not on file  Occupational History  . Not on file  Tobacco Use  . Smoking status: Former Smoker    Types: Cigarettes  . Smokeless tobacco: Never Used  Substance and Sexual Activity  . Alcohol use: Yes    Alcohol/week: 0.0 oz    Comment: occasional  . Drug use: No  .   Name: Tracy [name]   MRN: [id number]    DOB: 12/08/1929   Date:10/06/2017       Progress Note  Subjective  Chief Complaint  Chief Complaint  Patient presents with  . Diabetes  . Medication Refill    Diabetes  He presents for his follow-up diabetic visit. He has type 2 diabetes mellitus. His disease course has been stable. There are no hypoglycemic associated symptoms. Pertinent negatives for hypoglycemia include no headaches. (Has stopped Glipizide on Endocrinology recommendation. ) Pertinent negatives for diabetes include no blurred vision, no chest pain, no fatigue, no foot paresthesias, no polydipsia and no polyuria. Pertinent negatives for diabetic complications include no CVA. Current diabetic treatment includes oral agent (monotherapy) and insulin injections. His weight is stable. He is following a generally healthy diet. He rarely participates in exercise. He monitors blood glucose at home 1-2 x per day. His breakfast blood glucose range is generally 110-130 mg/dl.  Hypertension  This is a chronic problem. The problem is unchanged. The problem is controlled. Pertinent negatives include no blurred vision, chest pain, headaches, palpitations or shortness of breath. Past treatments include calcium channel blockers and ACE inhibitors. Hypertensive end-organ damage includes CAD/MI. There is no history of kidney disease or CVA.  Hyperlipidemia  This is a chronic problem. The problem is uncontrolled. Recent lipid tests were reviewed and are high. Pertinent negatives include no chest pain, myalgias or shortness of breath. Current antihyperlipidemic treatment includes statins.  Gout: Pt. Takes Allopurinol 100 mg BID for history of gout, no recent gout attacks, eating healthy otherwise and eats a lot of vegetables.     Past Medical History:  Diagnosis Date  . Arthritis   . Cancer (HCC)    SKIN CANCER  . Diabetes mellitus without complication (HCC)   . GERD (gastroesophageal reflux  disease)   . Hypertension   . Myocardial infarction (HCC)    08/27/1979    Past Surgical History:  Procedure Laterality Date  . CATARACT EXTRACTION W/ INTRAOCULAR LENS  IMPLANT, BILATERAL    . CIRCUMCISION    . GANGLION CYST EXCISION     LEFT HAND  . LUMBAR LAMINECTOMY/DECOMPRESSION MICRODISCECTOMY  09/13/2017   Procedure: BILATERAL Lumbar three-four , Left  Lumbar four-five  AND Left  L5-S1, LAMINECTOMY/FORAMINOTOMY;  Surgeon: Cram, Gary, MD;  [facility];  Service: Neurosurgery;;  . MELANOMA EXCISION  07/2011  . TONSILLECTOMY    . VASECTOMY      Family History  Problem Relation Age of Onset  . Heart disease Mother   . Diabetes Mother        type 2  . Heart disease Brother     Social History   Socioeconomic History  . Marital status: Married    Spouse name: Not on file  . Number of children: Not on file  . Years of education: Not on file  . Highest education level: Not on file  Social Needs  . Financial resource strain: Not on file  . Food insecurity - worry: Not on file  . Food insecurity - inability: Not on file  . Transportation needs - medical: Not on file  . Transportation needs - non-medical: Not on file  Occupational History  . Not on file  Tobacco Use  . Smoking status: Former Smoker    Types: Cigarettes  . Smokeless tobacco: Never Used  Substance and Sexual Activity  . Alcohol use: Yes    Alcohol/week: 0.0 oz    Comment: occasional  . Drug use: No  .

## 2017-10-09 DIAGNOSIS — H401132 Primary open-angle glaucoma, bilateral, moderate stage: Secondary | ICD-10-CM | POA: Diagnosis not present

## 2017-10-10 DIAGNOSIS — M545 Low back pain: Secondary | ICD-10-CM | POA: Diagnosis not present

## 2017-10-10 DIAGNOSIS — M1A0710 Idiopathic chronic gout, right ankle and foot, without tophus (tophi): Secondary | ICD-10-CM | POA: Diagnosis not present

## 2017-10-10 DIAGNOSIS — E781 Pure hyperglyceridemia: Secondary | ICD-10-CM | POA: Diagnosis not present

## 2017-10-10 LAB — URIC ACID: Uric Acid, Serum: 4.5 mg/dL (ref 4.0–8.0)

## 2017-10-10 LAB — LIPID PANEL
CHOL/HDL RATIO: 1.9 (calc) (ref <5.0)
CHOLESTEROL: 82 mg/dL (ref <200)
HDL: 43 mg/dL (ref >40)
LDL CHOLESTEROL (CALC): 21 mg/dL
Non-HDL Cholesterol (Calc): 39 mg/dL (calc) (ref <130)
TRIGLYCERIDES: 96 mg/dL (ref <150)

## 2017-10-12 ENCOUNTER — Encounter: Payer: Self-pay | Admitting: Urology

## 2017-10-12 ENCOUNTER — Ambulatory Visit (INDEPENDENT_AMBULATORY_CARE_PROVIDER_SITE_OTHER): Payer: Medicare HMO | Admitting: Urology

## 2017-10-12 VITALS — BP 134/54 | HR 98 | Ht 70.0 in | Wt 180.0 lb

## 2017-10-12 DIAGNOSIS — R339 Retention of urine, unspecified: Secondary | ICD-10-CM | POA: Diagnosis not present

## 2017-10-12 DIAGNOSIS — N401 Enlarged prostate with lower urinary tract symptoms: Secondary | ICD-10-CM | POA: Diagnosis not present

## 2017-10-12 DIAGNOSIS — N138 Other obstructive and reflux uropathy: Secondary | ICD-10-CM

## 2017-10-12 DIAGNOSIS — Z87898 Personal history of other specified conditions: Secondary | ICD-10-CM | POA: Diagnosis not present

## 2017-10-12 LAB — BLADDER SCAN AMB NON-IMAGING

## 2017-10-12 NOTE — Progress Notes (Signed)
 10/12/2017 1:49 PM   Tracy [name] 03/12/1930 [id number]  Referring provider: Shah, Syed Asad A, [name] [address] [place], [place] [address]  Chief Complaint  Patient presents with  . Follow-up    HPI: 84-year-old male presents for follow-up of urinary retention which occurred after lumbar spine surgery performed in early January.  I saw him on 1/17 and he had no voiding difficulties.  He feels he is at baseline.  He is on doxazosin and finasteride for BPH prescribed by his PCP.   PMH: Past Medical History:  Diagnosis Date  . Arthritis   . Cancer (HCC)    SKIN CANCER  . Diabetes mellitus without complication (HCC)   . GERD (gastroesophageal reflux disease)   . Hypertension   . Myocardial infarction (HCC)    08/27/1979    Surgical History: Past Surgical History:  Procedure Laterality Date  . CATARACT EXTRACTION W/ INTRAOCULAR LENS  IMPLANT, BILATERAL    . CIRCUMCISION    . GANGLION CYST EXCISION     LEFT HAND  . LUMBAR LAMINECTOMY/DECOMPRESSION MICRODISCECTOMY  09/13/2017   Procedure: BILATERAL Lumbar three-four , Left  Lumbar four-five  AND Left  L5-S1, LAMINECTOMY/FORAMINOTOMY;  Surgeon: Cram, Gary, [name];  Location[facility];  Service: Neurosurgery;;  . MELANOMA EXCISION  07/2011  . TONSILLECTOMY    . VASECTOMY      Home Medications:  Allergies as of 10/12/2017      Reactions   Tetracyclines & Related    UNSPECIFIED REACTION       Medication List        Accurate as of 10/12/17  1:49 PM. Always use your most recent med list.          ACCU-CHEK FASTCLIX LANCETS Misc CHECK  BLOOD  SUGAR TWICE DAILY   ACCU-CHEK NANO SMARTVIEW w/Device Kit USE AS DIRECTED   allopurinol 100 MG tablet Commonly known as:  ZYLOPRIM Take 1 tablet (100 mg total) by mouth 2 (two) times daily.   amLODipine 5 MG tablet Commonly known as:  NORVASC Take 1 tablet (5 mg total) by mouth daily.   aspirin 81 MG tablet Take 81 mg by mouth.   atorvastatin 10 MG  tablet Commonly known as:  LIPITOR TAKE 1 TABLET DAILY AT 6 PM.   B12 FOLATE PO Take 1 tablet by mouth daily.   CENTRUM SILVER 50+MEN Tabs Take 1 tablet by mouth daily.   cyclobenzaprine 10 MG tablet Commonly known as:  FLEXERIL Take 1 tablet (10 mg total) by mouth 3 (three) times daily as needed for muscle spasms.   DAILY VITAMINS tablet Take by mouth.   dorzolamide 2 % ophthalmic solution Commonly known as:  TRUSOPT   doxazosin 4 MG tablet Commonly known as:  CARDURA Take 1 tablet (4 mg total) by mouth at bedtime.   finasteride 5 MG tablet Commonly known as:  PROSCAR Take 1 tablet (5 mg total) by mouth at bedtime.   glucose blood test strip Commonly known as:  COOL BLOOD GLUCOSE TEST STRIPS Use as instructed   HYDROcodone-acetaminophen 5-325 MG tablet Commonly known as:  NORCO/VICODIN Take 1 tablet by mouth every 4 (four) hours as needed for moderate pain.   Insulin Pen Needle 31G X 8 MM Misc Commonly known as:  B-D ULTRAFINE III SHORT PEN USE AS DIRECTED WITH INSULIN PEN.   LANTUS SOLOSTAR 100 UNIT/ML Solostar Pen Generic drug:  Insulin Glargine Inject 50 Units into the skin at bedtime.   lisinopril 40 MG tablet Commonly   10/12/2017 1:49 PM   Tracy [name] 09-30-1929 [id number]  Referring provider: Roselee Nova, [name] [address] [place], [place] [address]  Chief Complaint  Patient presents with  . Follow-up    HPI: 84 year old male presents for follow-up of urinary retention which occurred after lumbar spine surgery performed in early January.  I saw him on 1/17 and he had no voiding difficulties.  He feels he is at baseline.  He is on doxazosin and finasteride for BPH prescribed by his PCP.   PMH: Past Medical History:  Diagnosis Date  . Arthritis   . Cancer [facility])    SKIN CANCER  . Diabetes mellitus without complication ([place])   . GERD (gastroesophageal reflux disease)   . Hypertension   . Myocardial infarction [facility])    08/27/1979    Surgical History: Past Surgical History:  Procedure Laterality Date  . CATARACT EXTRACTION W/ INTRAOCULAR LENS  IMPLANT, BILATERAL    . CIRCUMCISION    . GANGLION CYST EXCISION     LEFT HAND  . LUMBAR LAMINECTOMY/DECOMPRESSION MICRODISCECTOMY  09/13/2017   Procedure: BILATERAL Lumbar three-four , Left  Lumbar four-five  AND Left  L5-S1, LAMINECTOMY/FORAMINOTOMY;  Surgeon: Tracy [name];  Location: [facility];  Service: Neurosurgery;;  . MELANOMA EXCISION  07/2011  . TONSILLECTOMY    . VASECTOMY      Home Medications:  Allergies as of 10/12/2017      Reactions   Tetracyclines & Related    UNSPECIFIED REACTION       Medication List        Accurate as of 10/12/17  1:49 PM. Always use your most recent med list.          ACCU-CHEK FASTCLIX LANCETS Misc CHECK  BLOOD  SUGAR TWICE DAILY   ACCU-CHEK NANO SMARTVIEW w/Device Kit USE AS DIRECTED   allopurinol 100 MG tablet Commonly known as:  ZYLOPRIM Take 1 tablet (100 mg total) by mouth 2 (two) times daily.   amLODipine 5 MG tablet Commonly known as:  NORVASC Take 1 tablet (5 mg total) by mouth daily.   aspirin 81 MG tablet Take 81 mg by mouth.   atorvastatin 10 MG  tablet Commonly known as:  LIPITOR TAKE 1 TABLET DAILY AT 6 PM.   B12 FOLATE PO Take 1 tablet by mouth daily.   CENTRUM SILVER 50+MEN Tabs Take 1 tablet by mouth daily.   cyclobenzaprine 10 MG tablet Commonly known as:  FLEXERIL Take 1 tablet (10 mg total) by mouth 3 (three) times daily as needed for muscle spasms.   DAILY VITAMINS tablet Take by mouth.   dorzolamide 2 % ophthalmic solution Commonly known as:  TRUSOPT   doxazosin 4 MG tablet Commonly known as:  CARDURA Take 1 tablet (4 mg total) by mouth at bedtime.   finasteride 5 MG tablet Commonly known as:  PROSCAR Take 1 tablet (5 mg total) by mouth at bedtime.   glucose blood test strip Commonly known as:  COOL BLOOD GLUCOSE TEST STRIPS Use as instructed   HYDROcodone-acetaminophen 5-325 MG tablet Commonly known as:  NORCO/VICODIN Take 1 tablet by mouth every 4 (four) hours as needed for moderate pain.   Insulin Pen Needle 31G X 8 MM Misc Commonly known as:  B-D ULTRAFINE III SHORT PEN USE AS DIRECTED WITH INSULIN PEN.   LANTUS SOLOSTAR 100 UNIT/ML Solostar Pen Generic drug:  Insulin Glargine Inject 50 Units into the skin at bedtime.   lisinopril 40 MG tablet Commonly

## 2017-10-13 DIAGNOSIS — M545 Low back pain: Secondary | ICD-10-CM | POA: Diagnosis not present

## 2017-10-16 DIAGNOSIS — M545 Low back pain: Secondary | ICD-10-CM | POA: Diagnosis not present

## 2017-10-19 ENCOUNTER — Ambulatory Visit: Payer: Medicare HMO | Admitting: Family Medicine

## 2017-10-19 ENCOUNTER — Encounter: Payer: Self-pay | Admitting: Family Medicine

## 2017-10-19 VITALS — BP 128/68 | HR 90 | Temp 98.4°F | Resp 16 | Wt 176.1 lb

## 2017-10-19 DIAGNOSIS — B9789 Other viral agents as the cause of diseases classified elsewhere: Secondary | ICD-10-CM

## 2017-10-19 DIAGNOSIS — J069 Acute upper respiratory infection, unspecified: Secondary | ICD-10-CM | POA: Diagnosis not present

## 2017-10-19 DIAGNOSIS — J029 Acute pharyngitis, unspecified: Secondary | ICD-10-CM

## 2017-10-19 LAB — POCT INFLUENZA A/B
INFLUENZA B, POC: NEGATIVE
Influenza A, POC: NEGATIVE

## 2017-10-19 NOTE — Progress Notes (Signed)
Name: Tracy [name]   MRN: [id number]    DOB: 1930/07/27   Date:10/19/2017       Progress Note  Subjective  Chief Complaint  Chief Complaint  Patient presents with  . Cough     onset: monday sneezing, runny nose. Pt denies body aches/chills and fever   . Sore Throat    onset: monday mild not as bad now.    HPI  Pt. Presents for evaluation of Upper Respiratory infection. His symptoms started 4 days ago with sneezing and runny nose, then came the sore throat, no fevers or chills, he has been taking Zyrtec which helps relieve some symptoms but also makes him sleepy.  Coughing is better now, sore throat has resolved.    Past Medical History:  Diagnosis Date  . Arthritis   . Cancer [facility])    SKIN CANCER  . Diabetes mellitus without complication ([place])   . GERD (gastroesophageal reflux disease)   . Hypertension   . Myocardial infarction [facility])    08/27/1979    Past Surgical History:  Procedure Laterality Date  . CATARACT EXTRACTION W/ INTRAOCULAR LENS  IMPLANT, BILATERAL    . CIRCUMCISION    . GANGLION CYST EXCISION     LEFT HAND  . LUMBAR LAMINECTOMY/DECOMPRESSION MICRODISCECTOMY  09/13/2017   Procedure: BILATERAL Lumbar three-four , Left  Lumbar four-five  AND Left  L5-S1, LAMINECTOMY/FORAMINOTOMY;  Surgeon: [name], MD;  Location: [facility];  Service: Neurosurgery;;  . MELANOMA EXCISION  07/2011  . TONSILLECTOMY    . VASECTOMY      Family History  Problem Relation Age of Onset  . Heart disease Mother   . Diabetes Mother        type 2  . Heart disease Brother     Social History   Socioeconomic History  . Marital status: Married    Spouse name: Not on file  . Number of children: Not on file  . Years of education: Not on file  . Highest education level: Not on file  Social Needs  . Financial resource strain: Not on file  . Food insecurity - worry: Not on file  . Food insecurity - inability: Not on file  . Transportation needs - medical: Not on file  .  Transportation needs - non-medical: Not on file  Occupational History  . Not on file  Tobacco Use  . Smoking status: Former Smoker    Types: Cigarettes  . Smokeless tobacco: Never Used  Substance and Sexual Activity  . Alcohol use: Yes    Alcohol/week: 0.0 oz    Comment: occasional  . Drug use: No  . Sexual activity: Not on file  Other Topics Concern  . Not on file  Social History Narrative  . Not on file     Current Outpatient Medications:  .  ACCU-CHEK FASTCLIX LANCETS MISC, CHECK  BLOOD  SUGAR TWICE DAILY, Disp: 204 each, Rfl: 1 .  allopurinol (ZYLOPRIM) 100 MG tablet, Take 1 tablet (100 mg total) by mouth 2 (two) times daily., Disp: 180 tablet, Rfl: 1 .  amLODipine (NORVASC) 5 MG tablet, Take 1 tablet (5 mg total) by mouth daily., Disp: 90 tablet, Rfl: 1 .  aspirin 81 MG tablet, Take 81 mg by mouth., Disp: , Rfl:  .  atorvastatin (LIPITOR) 10 MG tablet, TAKE 1 TABLET DAILY AT 6 PM., Disp: 90 tablet, Rfl: 1 .  Blood Glucose Monitoring Suppl (ACCU-CHEK NANO SMARTVIEW) W/DEVICE KIT, USE AS DIRECTED, Disp: 1 kit, Rfl: 0 .  Cholecalciferol (VITAMIN D) 2000 units tablet, Take 2,000 Units by mouth daily., Disp: , Rfl:  .  Cobalamine Combinations (B12 FOLATE PO), Take 1 tablet by mouth daily., Disp: , Rfl:  .  cyclobenzaprine (FLEXERIL) 10 MG tablet, Take 1 tablet (10 mg total) by mouth 3 (three) times daily as needed for muscle spasms., Disp: 30 tablet, Rfl: 0 .  dorzolamide (TRUSOPT) 2 % ophthalmic solution, , Disp: , Rfl:  .  doxazosin (CARDURA) 4 MG tablet, Take 1 tablet (4 mg total) by mouth at bedtime., Disp: 90 tablet, Rfl: 1 .  finasteride (PROSCAR) 5 MG tablet, Take 1 tablet (5 mg total) by mouth at bedtime., Disp: 90 tablet, Rfl: 1 .  glucose blood (COOL BLOOD GLUCOSE TEST STRIPS) test strip, Use as instructed, Disp: 100 each, Rfl: 12 .  HYDROcodone-acetaminophen (NORCO/VICODIN) 5-325 MG tablet, Take 1 tablet by mouth every 4 (four) hours as needed for moderate pain., Disp: 20  tablet, Rfl: 0 .  Insulin Glargine (LANTUS SOLOSTAR) 100 UNIT/ML Solostar Pen, Inject 50 Units into the skin at bedtime. , Disp: , Rfl:  .  Insulin Pen Needle (B-D ULTRAFINE III SHORT PEN) 31G X 8 MM MISC, USE AS DIRECTED WITH INSULIN PEN., Disp: 90 each, Rfl: 0 .  lisinopril (PRINIVIL,ZESTRIL) 40 MG tablet, Take 1 tablet (40 mg total) by mouth daily., Disp: 90 tablet, Rfl: 0 .  metFORMIN (GLUCOPHAGE) 1000 MG tablet, Take 1 tablet (1,000 mg total) by mouth 2 (two) times daily with a meal., Disp: 180 tablet, Rfl: 1 .  Multiple Vitamin (DAILY VITAMINS) tablet, Take by mouth., Disp: , Rfl:  .  Multiple Vitamins-Minerals (CENTRUM SILVER 50+MEN) TABS, Take 1 tablet by mouth daily., Disp: , Rfl:  .  NEEDLE, DISP, 14 G 14G X 1" MISC, , Disp: , Rfl:  .  omega-3 fish oil (MAXEPA) 1000 MG CAPS capsule, Take 1 capsule by mouth daily., Disp: , Rfl:  .  ranitidine (ZANTAC) 150 MG capsule, Take 1 capsule (150 mg total) by mouth daily as needed., Disp: 90 capsule, Rfl: 0 .  timolol (TIMOPTIC) 0.5 % ophthalmic solution, Frequency:QD   Dosage:0.0     Instructions:  Note:Dose: 0.5 %, OS, Disp: , Rfl:  .  vitamin B-12 (CYANOCOBALAMIN) 500 MCG tablet, Take 1 tablet by mouth daily., Disp: , Rfl:   Allergies  Allergen Reactions  . Tetracyclines & Related     UNSPECIFIED REACTION      ROS  Please see history of present illness for complete description of ROS  Objective  Vitals:   10/19/17 0832  BP: 128/68  Pulse: 90  Resp: 16  Temp: 98.4 F (36.9 C)  TempSrc: Oral  SpO2: 98%  Weight: 176 lb 1.6 oz (79.9 kg)    Physical Exam  Constitutional: He is oriented to person, place, and time and well-developed, well-nourished, and in no distress.  HENT:  Head: Normocephalic and atraumatic.  Right Ear: Tympanic membrane and ear canal normal.  Left Ear: Tympanic membrane and ear canal normal.  Mouth/Throat: Posterior oropharyngeal erythema present.  R>L nasal turbinate hypertrophy.  Cardiovascular: Normal  rate, regular rhythm, S1 normal, S2 normal and normal heart sounds.  No murmur heard. Pulmonary/Chest: Effort normal and breath sounds normal. He has no wheezes.  Neurological: He is alert and oriented to person, place, and time.  Nursing note and vitals reviewed.    Assessment & Plan  1. Sore throat Negative for influenza - POCT Influenza A/B  2. Viral URI with cough Suspect a viral URI,  advised on conservative treatments, he may discontinue his Zyrtec at this time. Symptoms improving   Syed Asad A. [facility] 10/19/2017 8:46 AM

## 2017-11-07 ENCOUNTER — Ambulatory Visit (INDEPENDENT_AMBULATORY_CARE_PROVIDER_SITE_OTHER): Payer: Medicare HMO

## 2017-11-07 VITALS — BP 138/60 | HR 68 | Temp 97.7°F | Resp 12 | Ht 70.0 in | Wt 177.5 lb

## 2017-11-07 DIAGNOSIS — Z0000 Encounter for general adult medical examination without abnormal findings: Secondary | ICD-10-CM

## 2017-11-07 NOTE — Progress Notes (Signed)
Subjective:   ABDURRAHMAN [name] is a 84 y.o. male who presents for Medicare Annual/Subsequent preventive examination.  Review of Systems:  N/A Cardiac Risk Factors include: advanced age (>46mn, >>24women);diabetes mellitus;hypertension;dyslipidemia;male gender     Objective:    Vitals: BP 138/60 (BP Location: Left Arm, Patient Position: Sitting, Cuff Size: Normal)   Pulse 68   Temp 97.7 F (36.5 C) (Oral)   Resp 12   Ht _0  (1.778 m)   Wt 177 lb 8 oz (80.5 kg)   BMI 25.47 kg/m   Body mass index is 25.47 kg/m.  Advanced Directives 11/07/2017 09/19/2017 09/13/2017 09/07/2017 06/27/2017 05/03/2017 04/19/2017  Does Patient Have a Medical Advance Directive? _1  Yes Yes  Type of [place]aramed[place]of [place] will Living will Living will;Healthcare Power of Attorney Living will;Healthcare Power of Attorney Living will Living will Living will  Does patient want to make changes to medical advance directive? - - No - Patient declined - - - -  Copy of [place]? No - copy requested - No - copy requested No - copy requested - - -  Would patient like information on creating a medical advance directive? - - - - - - -    Tobacco Social History   Tobacco Use  Smoking Status Former Smoker  . Packs/day: 2.00  . Years: 13.00  . Pack years: 26.00  . Types: Cigarettes  . Last attempt to quit: 1963  . Years since quitting: 56.1  Smokeless Tobacco Never Used  Tobacco Comment   smoking cessation materials not required     Counseling given: No Comment: smoking cessation materials not required   Clinical Intake:  Pre-visit preparation completed: Yes  Pain : No/denies pain   BMI - recorded: 25.47 Nutritional Status: BMI 25 -29 Overweight Nutritional Risks: None Diabetes: Yes CBG done?: No CBG resulted in Enter/ Edit results?: No Did pt. bring in CBG monitor from home?: No  How often do you need to have someone help  you when you read instructions, pamphlets, or other written materials from your doctor or pharmacy?: 1 - Never  Interpreter Needed?: No  Information entered by :: AEversole, LPN  Past Medical History:  Diagnosis Date  . Arthritis   . Cancer ([facility]    SKIN CANCER  . [facility]   . GERD (gastroesophageal reflux disease)   . Hypertension   . Myocardial infarction ([facility]    08/27/1979   Past Surgical History:  Procedure Laterality Date  . CATARACT EXTRACTION W/ INTRAOCULAR LENS  IMPLANT, BILATERAL    . CIRCUMCISION    . GANGLION CYST EXCISION     LEFT HAND  . LUMBAR LAMINECTOMY/DECOMPRESSION MICRODISCECTOMY  09/13/2017   Procedure: BILATERAL Lumbar three-four , Left  Lumbar four-five  AND Left  L5-S1, LAMINECTOMY/FORAMINOTOMY;  Surgeon: CKary Kos MD;  [facility]  Service: Neurosurgery;;  . MELANOMA EXCISION  07/2011  . TONSILLECTOMY    . VASECTOMY     Family History  Problem Relation Age of Onset  . Heart disease Mother   . Diabetes Mother        type 2  . Angina Mother   . Heart disease Brother   . Pneumonia Father   . Heart attack Father    Social History   Socioeconomic History  . Marital status: Married    Spouse name: Tracy [name] . Number of children: 4  . Years of education:  None  . Highest education level: Bachelor's degree (e.g., BA, Tracy, BS)  Social Needs  . Financial resource strain: Not hard at all  . Food insecurity - worry: Never true  . Food insecurity - inability: Never true  . Transportation needs - medical: No  . Transportation needs - non-medical: No  Occupational History  . Occupation: Retired  Tobacco Use  . Smoking status: Former Smoker    Packs/day: 2.00    Years: 13.00    Pack years: 26.00    Types: Cigarettes    Last attempt to quit: 1963    Years since quitting: 56.1  . Smokeless tobacco: Never Used  . Tobacco comment: smoking cessation materials not required  Substance and Sexual Activity  .  Alcohol use: Yes    Alcohol/week: 1.2 oz    Types: 2 Shots of liquor per week    Comment: occasional  . Drug use: No  . Sexual activity: Not Currently  Other Topics Concern  . None  Social History Narrative  . None    Outpatient Encounter Medications as of 11/07/2017  Medication Sig  . ACCU-CHEK FASTCLIX LANCETS MISC CHECK  BLOOD  SUGAR TWICE DAILY  . allopurinol (ZYLOPRIM) 100 MG tablet Take 1 tablet (100 mg total) by mouth 2 (two) times daily.  Marland Kitchen amLODipine (NORVASC) 5 MG tablet Take 1 tablet (5 mg total) by mouth daily.  Marland Kitchen aspirin 81 MG tablet Take 81 mg by mouth.  Marland Kitchen atorvastatin (LIPITOR) 10 MG tablet TAKE 1 TABLET DAILY AT 6 PM.  . Blood Glucose Monitoring Suppl (ACCU-CHEK NANO SMARTVIEW) W/DEVICE KIT USE AS DIRECTED  . Cholecalciferol (VITAMIN D) 2000 units tablet Take 2,000 Units by mouth daily.  . Cobalamine Combinations (B12 FOLATE PO) Take 1 tablet by mouth daily.  . dorzolamide (TRUSOPT) 2 % ophthalmic solution   . doxazosin (CARDURA) 4 MG tablet Take 1 tablet (4 mg total) by mouth at bedtime.  . finasteride (PROSCAR) 5 MG tablet Take 1 tablet (5 mg total) by mouth at bedtime.  Marland Kitchen glucose blood (COOL BLOOD GLUCOSE TEST STRIPS) test strip Use as instructed  . Insulin Glargine (LANTUS SOLOSTAR) 100 UNIT/ML Solostar Pen Inject 50 Units into the skin at bedtime.   . Insulin Pen Needle (B-D ULTRAFINE III SHORT PEN) 31G X 8 MM MISC USE AS DIRECTED WITH INSULIN PEN.  Marland Kitchen lisinopril (PRINIVIL,ZESTRIL) 40 MG tablet Take 1 tablet (40 mg total) by mouth daily.  . metFORMIN (GLUCOPHAGE) 1000 MG tablet Take 1 tablet (1,000 mg total) by mouth 2 (two) times daily with a meal.  . Multiple Vitamin (DAILY VITAMINS) tablet Take by mouth.  . Multiple Vitamins-Minerals (CENTRUM SILVER 50+MEN) TABS Take 1 tablet by mouth daily.  Marland Kitchen NEEDLE, DISP, 14 G 14G X 1" MISC   . omega-3 fish oil (MAXEPA) 1000 MG CAPS capsule Take 1 capsule by mouth daily.  . ranitidine (ZANTAC) 150 MG capsule Take 1 capsule  (150 mg total) by mouth daily as needed.  . timolol (TIMOPTIC) 0.5 % ophthalmic solution Frequency:QD   Dosage:0.0     Instructions:  Note:Dose: 0.5 %, OS  . vitamin B-12 (CYANOCOBALAMIN) 500 MCG tablet Take 1 tablet by mouth daily.  . cyclobenzaprine (FLEXERIL) 10 MG tablet Take 1 tablet (10 mg total) by mouth 3 (three) times daily as needed for muscle spasms. (Patient not taking: Reported on 11/07/2017)  . HYDROcodone-acetaminophen (NORCO/VICODIN) 5-325 MG tablet Take 1 tablet by mouth every 4 (four) hours as needed for moderate pain. (Patient not taking: Reported  on 11/07/2017)   No facility-administered encounter medications on file as of 11/07/2017.     Activities of Daily Living In your present state of health, do you have any difficulty performing the following activities: 11/07/2017 10/19/2017  Hearing? N N  Comment denies wearing hearing aids -  Vision? N Y  Comment wears eyeglasses -  Difficulty concentrating or making decisions? Y N  Comment short term memory loss -  Walking or climbing stairs? N N  Comment legs feel weaker -  Dressing or bathing? N N  Doing errands, shopping? N N  Preparing Food and eating ? N -  Comment denies dentures -  Using the Toilet? N -  In the past six months, have you accidently leaked urine? N -  Do you have problems with loss of bowel control? N -  Managing your Medications? N -  Managing your Finances? N -  Housekeeping or managing your Housekeeping? N -  Some recent data might be hidden    Patient Care Team: Hubbard Hartshorn, FNP as PCP - General (Family Medicine) Dorisann Frames, MD as Referring Physician (Endocrinology) Hubbard Hartshorn, FNP as Nurse Practitioner (Family Medicine) Dasher, Rayvon Char, MD (Dermatology)   Assessment:   This is a routine wellness examination for [facility]  Exercise Activities and Dietary recommendations Current Exercise Habits: Structured exercise class, Type of exercise: walking;strength training/weights, Time  (Minutes): 60, Frequency (Times/Week): 5, Weekly Exercise (Minutes/Week): 300, Intensity: Mild, Exercise limited by: None identified  Goals    . DIET - INCREASE WATER INTAKE     Recommend to drink at least 6-8 8oz glasses of water per day.       Fall Risk Fall Risk  11/07/2017 10/19/2017 10/06/2017 08/07/2017 06/27/2017  Falls in the past year? _0   Risk for fall due to : Medication side effect;Impaired vision - - - -  Risk for fall due to: Comment wears eyeglasses - - - -   Is the patient's home free of loose throw rugs in walkways, pet beds, electrical cords, etc?   Yes Does the patient have any grab bars in the bathroom? Yes  Does the patient use a shower chair when bathing? No Does the patient have any stairs in or around the home? Yes If so, are there any handrails?  Yes Does the patient have adequate lighting?  Yes Does the patient use a cane, walker or w/c? No Does the patient use of an elevated toilet seat? Yes  Timed Get Up and Go Performed: Yes. Pt ambulated 10 feet within 9 sec. Gait stead-fast and without the use of an assistive device. No intervention required at this time. Fall risk prevention has been discussed.  Community Resource Referral sent to Care Guide for shower chair. States he and his wife have a rubber mat in shower to help reduce risk for fall in the shower. Would prefer a shower chair to help reduce risk for fall.  Depression Screen PHQ 2/9 Scores 11/07/2017 11/07/2017 10/19/2017 10/06/2017  PHQ - 2 Score 0 0 0 0  PHQ- 9 Score 0 - - -    Cognitive Function     6CIT Screen 11/07/2017 11/28/2016  What Year? 0 points 0 points  What month? 0 points 0 points  What time? 0 points 0 points  Count back from 20 0 points 0 points  Months in reverse 0 points 0 points  Repeat phrase 2 points 0 points  Total Score 2 0  Immunization History  Administered Date(s) Administered  . Influenza, High Dose Seasonal PF 07/07/2014, 07/28/2016, 06/27/2017  .  Influenza, Seasonal, Injecte, Preservative Fre 07/16/2012  . Influenza,inj,Quad PF,6+ Mos 06/18/2013, 06/30/2015  . Influenza-Unspecified 07/16/2015  . Pneumococcal Conjugate-13 07/07/2014  . Pneumococcal Polysaccharide-23 07/26/2010  . Tdap 11/28/2016   Overdue for diabetic eye exam. States he just recently completed exam with [facility]. Advised pt to call their office and request records be forwarded to our office. Verbalized acceptance and understanding.  Qualifies for Shingles Vaccine? Yes. Due for Zostavax or Shingrix vaccine. Education has been provided regarding the importance of this vaccine. Pt has been advised to call his insurance company to determine his out of pocket expense. Advised he may also receive this vaccine at his local pharmacy or Health Dept. Verbalized acceptance and understanding.  Screening Tests Health Maintenance  Topic Date Due  . OPHTHALMOLOGY EXAM  07/13/2017  . HEMOGLOBIN A1C  04/05/2018  . FOOT EXAM  05/03/2018  . TETANUS/TDAP  11/29/2026  . INFLUENZA VACCINE  Completed  . PNA vac Low Risk Adult  Completed   Cancer Screenings: Lung: Low Dose CT Chest recommended if Age 70-80 years, 30 pack-year currently smoking OR have quit w/in 15years. Patient does not qualify. Colorectal: Completed colonoscopy 04/12/13. Colorectal screenings no longer required  Additional Screenings: Hepatitis B/HIV/Syphillis: Does not qualify Hepatitis C Screening: Does not qualify    Plan:  I have personally reviewed and addressed the Medicare Annual Wellness questionnaire and have noted the following in the patient's chart:  A. Medical and social history B. Use of alcohol, tobacco or illicit drugs  C. Current medications and supplements D. Functional ability and status E.  Nutritional status F.  Physical activity G. Advance directives and Code Status: H. List of other physicians I.  Hospitalizations, surgeries, and ER visits in previous 12 months J.   [place] such as hearing and vision if needed, cognitive and depression L. Referrals and appointments - none  In addition, I have reviewed and discussed with patient certain preventive protocols, quality metrics, and best practice recommendations. A written personalized care plan for preventive services as well as general preventive health recommendations were provided to patient.  See attached scanned questionnaire for additional information.   Signed,  Aleatha Borer, LPN Nurse Health Advisor

## 2017-11-07 NOTE — Patient Instructions (Signed)
Tracy [name] , Thank you for taking time to come for your Medicare Wellness Visit. I appreciate your ongoing commitment to your health goals. Please review the following plan we discussed and let me know if I can assist you in the future.   Screening recommendations/referrals: Colorectal Screening: No longer required Lung Cancer Screening: You do not qualify for this screening Hepatitis C Screening: You do not qualify for this screening HIV/Syphilis/Hepatitis B Screening: You do not qualify for this screening   Vision/Dental/Diabetic Exams: Diabetic Exams: Recommend annual diabetic eye exams for retinopathy and diabetic foot exams.  Diabetic Eye Exam: Please schedule an appointment with your ophthalmologist Diabetic Foot Exam: Up to date Recommended yearly ophthalmology/optometry visit for glaucoma screening and checkup Recommended yearly dental visit for hygiene and checkup  Vaccinations: Influenza vaccine: Up to date Pneumococcal vaccine: Completed series Tdap vaccine: Up to date Shingles vaccine: Please call your insurance company to determine your out of pocket expense for the Shingrix vaccine. You may also receive this vaccine at your local pharmacy or Health Dept.   Advanced directives: Please bring a copy of your POA (Power of Attorney) and/or Living Will to your next appointment.  Conditions/risks identified: Recommend to drink at least 6-8 8oz glasses of water per day.  Next appointment: Please schedule an appointment with Dr. Ancil Boozer within the next 30 days for follow up after today's Annual Wellness Visit.  Please schedule your Annual Wellness Visit with your Nurse Health Advisor in one year.   Preventive Care 82 Years and Older, Male Preventive care refers to lifestyle choices and visits with your health care provider that can promote health and wellness. What does preventive care include?  A yearly physical exam. This is also called an annual well check.  Dental exams  once or twice a year.  Routine eye exams. Ask your health care provider how often you should have your eyes checked.  Personal lifestyle choices, including:  Daily care of your teeth and gums.  Regular physical activity.  Eating a healthy diet.  Avoiding tobacco and drug use.  Limiting alcohol use.  Practicing safe sex.  Taking low doses of aspirin every day.  Taking vitamin and mineral supplements as recommended by your health care provider. What happens during an annual well check? The services and screenings done by your health care provider during your annual well check will depend on your age, overall health, lifestyle risk factors, and family history of disease. Counseling  Your health care provider may ask you questions about your:  Alcohol use.  Tobacco use.  Drug use.  Emotional well-being.  Home and relationship well-being.  Sexual activity.  Eating habits.  History of falls.  Memory and ability to understand (cognition).  Work and work Statistician. Screening  You may have the following tests or measurements:  Height, weight, and BMI.  Blood pressure.  Lipid and cholesterol levels. These may be checked every 5 years, or more frequently if you are over 64 years old.  Skin check.  Lung cancer screening. You may have this screening every year starting at age 55 if you have a 30-pack-year history of smoking and currently smoke or have quit within the past 15 years.  Fecal occult blood test (FOBT) of the stool. You may have this test every year starting at age 64.  Flexible sigmoidoscopy or colonoscopy. You may have a sigmoidoscopy every 5 years or a colonoscopy every 10 years starting at age 73.  Prostate cancer screening. Recommendations will vary depending on  your family history and other risks.  Hepatitis C blood test.  Hepatitis B blood test.  Sexually transmitted disease (STD) testing.  Diabetes screening. This is done by checking your  blood sugar (glucose) after you have not eaten for a while (fasting). You may have this done every 1-3 years.  Abdominal aortic aneurysm (AAA) screening. You may need this if you are a current or former smoker.  Osteoporosis. You may be screened starting at age 60 if you are at high risk. Talk with your health care provider about your test results, treatment options, and if necessary, the need for more tests. Vaccines  Your health care provider may recommend certain vaccines, such as:  Influenza vaccine. This is recommended every year.  Tetanus, diphtheria, and acellular pertussis (Tdap, Td) vaccine. You may need a Td booster every 10 years.  Zoster vaccine. You may need this after age 67.  Pneumococcal 13-valent conjugate (PCV13) vaccine. One dose is recommended after age 21.  Pneumococcal polysaccharide (PPSV23) vaccine. One dose is recommended after age 8. Talk to your health care provider about which screenings and vaccines you need and how often you need them. This information is not intended to replace advice given to you by your health care provider. Make sure you discuss any questions you have with your health care provider. Document Released: 09/25/2015 Document Revised: 05/18/2016 Document Reviewed: 06/30/2015 Elsevier Interactive Patient Education  2017 [place] Prevention in the Home Falls can cause injuries. They can happen to people of all ages. There are many things you can do to make your home safe and to help prevent falls. What can I do on the outside of my home?  Regularly fix the edges of walkways and driveways and fix any cracks.  Remove anything that might make you trip as you walk through a door, such as a raised step or threshold.  Trim any bushes or trees on the path to your home.  Use bright outdoor lighting.  Clear any walking paths of anything that might make someone trip, such as rocks or tools.  Regularly check to see if handrails are  loose or broken. Make sure that both sides of any steps have handrails.  Any raised decks and porches should have guardrails on the edges.  Have any leaves, snow, or ice cleared regularly.  Use sand or salt on walking paths during winter.  Clean up any spills in your garage right away. This includes oil or grease spills. What can I do in the bathroom?  Use night lights.  Install grab bars by the toilet and in the tub and shower. Do not use towel bars as grab bars.  Use non-skid mats or decals in the tub or shower.  If you need to sit down in the shower, use a plastic, non-slip stool.  Keep the floor dry. Clean up any water that spills on the floor as soon as it happens.  Remove soap buildup in the tub or shower regularly.  Attach bath mats securely with double-sided non-slip rug tape.  Do not have throw rugs and other things on the floor that can make you trip. What can I do in the bedroom?  Use night lights.  Make sure that you have a light by your bed that is easy to reach.  Do not use any sheets or blankets that are too big for your bed. They should not hang down onto the floor.  Have a firm chair that has side arms. You  can use this for support while you get dressed.  Do not have throw rugs and other things on the floor that can make you trip. What can I do in the kitchen?  Clean up any spills right away.  Avoid walking on wet floors.  Keep items that you use a lot in easy-to-reach places.  If you need to reach something above you, use a strong step stool that has a grab bar.  Keep electrical cords out of the way.  Do not use floor polish or wax that makes floors slippery. If you must use wax, use non-skid floor wax.  Do not have throw rugs and other things on the floor that can make you trip. What can I do with my stairs?  Do not leave any items on the stairs.  Make sure that there are handrails on both sides of the stairs and use them. Fix handrails that  are broken or loose. Make sure that handrails are as long as the stairways.  Check any carpeting to make sure that it is firmly attached to the stairs. Fix any carpet that is loose or worn.  Avoid having throw rugs at the top or bottom of the stairs. If you do have throw rugs, attach them to the floor with carpet tape.  Make sure that you have a light switch at the top of the stairs and the bottom of the stairs. If you do not have them, ask someone to add them for you. What else can I do to help prevent falls?  Wear shoes that:  Do not have high heels.  Have rubber bottoms.  Are comfortable and fit you well.  Are closed at the toe. Do not wear sandals.  If you use a stepladder:  Make sure that it is fully opened. Do not climb a closed stepladder.  Make sure that both sides of the stepladder are locked into place.  Ask someone to hold it for you, if possible.  Clearly mark and make sure that you can see:  Any grab bars or handrails.  First and last steps.  Where the edge of each step is.  Use tools that help you move around (mobility aids) if they are needed. These include:  Canes.  Walkers.  Scooters.  Crutches.  Turn on the lights when you go into a dark area. Replace any light bulbs as soon as they burn out.  Set up your furniture so you have a clear path. Avoid moving your furniture around.  If any of your floors are uneven, fix them.  If there are any pets around you, be aware of where they are.  Review your medicines with your doctor. Some medicines can make you feel dizzy. This can increase your chance of falling. Ask your doctor what other things that you can do to help prevent falls. This information is not intended to replace advice given to you by your health care provider. Make sure you discuss any questions you have with your health care provider. Document Released: 06/25/2009 Document Revised: 02/04/2016 Document Reviewed: 10/03/2014 Elsevier  Interactive Patient Education  2017 [employer].

## 2017-11-14 ENCOUNTER — Ambulatory Visit: Payer: Self-pay | Admitting: Podiatry

## 2017-11-20 ENCOUNTER — Encounter: Payer: Self-pay | Admitting: Family Medicine

## 2017-11-20 ENCOUNTER — Ambulatory Visit: Payer: Medicare HMO | Admitting: Family Medicine

## 2017-11-20 ENCOUNTER — Telehealth: Payer: Self-pay | Admitting: Family Medicine

## 2017-11-20 VITALS — BP 130/70 | HR 97 | Temp 97.7°F | Resp 16 | Ht 70.0 in | Wt 176.8 lb

## 2017-11-20 DIAGNOSIS — M48061 Spinal stenosis, lumbar region without neurogenic claudication: Secondary | ICD-10-CM

## 2017-11-20 DIAGNOSIS — E781 Pure hyperglyceridemia: Secondary | ICD-10-CM

## 2017-11-20 DIAGNOSIS — K219 Gastro-esophageal reflux disease without esophagitis: Secondary | ICD-10-CM

## 2017-11-20 DIAGNOSIS — Z794 Long term (current) use of insulin: Secondary | ICD-10-CM

## 2017-11-20 DIAGNOSIS — E119 Type 2 diabetes mellitus without complications: Secondary | ICD-10-CM | POA: Diagnosis not present

## 2017-11-20 DIAGNOSIS — N400 Enlarged prostate without lower urinary tract symptoms: Secondary | ICD-10-CM | POA: Diagnosis not present

## 2017-11-20 DIAGNOSIS — M1A0710 Idiopathic chronic gout, right ankle and foot, without tophus (tophi): Secondary | ICD-10-CM

## 2017-11-20 DIAGNOSIS — I10 Essential (primary) hypertension: Secondary | ICD-10-CM | POA: Diagnosis not present

## 2017-11-20 MED ORDER — AMLODIPINE BESYLATE 5 MG PO TABS: 5.0000 mg | ORAL_TABLET | Freq: Every day | ORAL | 1 refills | Status: DC

## 2017-11-20 MED ORDER — RANITIDINE HCL 150 MG PO CAPS: 150.0000 mg | ORAL_CAPSULE | Freq: Every day | ORAL | 1 refills | Status: DC | PRN

## 2017-11-20 MED ORDER — METFORMIN HCL 1000 MG PO TABS: 1000.0000 mg | ORAL_TABLET | Freq: Two times a day (BID) | ORAL | 1 refills | Status: DC

## 2017-11-20 MED ORDER — FINASTERIDE 5 MG PO TABS: 5.0000 mg | ORAL_TABLET | Freq: Every day | ORAL | 1 refills | Status: DC

## 2017-11-20 MED ORDER — LISINOPRIL 40 MG PO TABS: 40.0000 mg | ORAL_TABLET | Freq: Every day | ORAL | 1 refills | Status: DC

## 2017-11-20 MED ORDER — DOXAZOSIN MESYLATE 4 MG PO TABS: 4.0000 mg | ORAL_TABLET | Freq: Every day | ORAL | 1 refills | Status: DC

## 2017-11-20 NOTE — Telephone Encounter (Signed)
Please call [facility] to obtain most recent eye examination - pt reported at his visit today that he had recent diabetic eye exam. Thank you!

## 2017-11-20 NOTE — Patient Instructions (Addendum)
-   Please check your blood pressure 3-4 times a week and if you are feeling dizzy or lightheaded.   - Please call our office if your blood pressure is above 150/90 or below 110/65  - Goal blood sugar in the morning (fasting) is between 100-150.   - STOP your glipizide, continue your lantus and metformin as prescribed.  If your morning blood sugar is going above 200, please call our office for further instructions.

## 2017-11-20 NOTE — Progress Notes (Signed)
Name: Tracy [name]   MRN: [id number]    DOB: [birth year]   Date:11/20/2017       Progress Note  Subjective  Chief Complaint  Chief Complaint  Patient presents with  . Hypertension  . Diabetes    discuss medication    HPI  Diabetes  Pt reports some labile BG's since having back surgery on 09/13/17 - he had low of 60 one day while taking glipizide BID, highest has been 160 (since taking glipizide once daily).  He was told to stop glipizide by previous PCP Dr. Manuella Ghazi on 10/06/2017, but felt that his sugars were spiking so he started taking glipizide once in the morning.  Also taking Lantus - 50units daily; Metformin 1074m BID.  Monitors fasting BG's every morning  - advised to stop glipizide and monitor per instructions on AVS - we will consider increasing Lantus if needed. Denies blurred vision, no chest pain, no fatigue, no foot paresthesias, no polydipsia, polyphagia, and no polyuria. Pertinent negatives for diabetic complications include no hx CVA. His weight is stable. He is following a generally healthy diet, likes to walk and golf, though he had recent back surgery which has decreased his activity lately. He used to see endocrinology - Saw Dr. HKenton Kingfisherin the past, but was turned back over to PCP for management.  On ACE-I, statin, and 854mASA daily  GERD: Takes 15056mantac PRN with good efficacy; worse if he has 2 alcoholic beverages.  Otherwise he just avoids triggers.  Takes Zantac about 4 times a week. Denies difficulty swallowing or regurgitation.  Hypertension  BP is controlled today; does not check at home but does have a cuff to do so and is willing to start.  Pertinent negatives include no blurred vision, chest pain, headaches, palpitations or shortness of breath.  He does report one episode of dizziness since being seen. He is currently taking Amlodipine 5mg2makes at night) and lisinopril 40mg62mkes QAM); also on doxsazosin.   Hypertensive end-organ damage includes CAD and MI  (1980). There is no history of kidney disease or CVA.   Hypertriglyceridemia Taking 10mg 53mvastatin daily. Recent lipid tests were reviewed and were very well controlled - TGD's 96, LDL 21.  Pertinent negatives include no chest pain, myalgias or shortness of breath. Taking 81mg A39maily.  Gout: Pt. Takes Allopurinol 100 mg BID for history of gout, no recent gout attacks, eating healthy otherwise and eats a lot of vegetables.  Urinary Retention & BPH  Saw Dr. StoioffBernardo Heater019 for urinary retention s/p urinary retention with catheter placedment in the ER on 09/19/2017 secondary to his lumbar spine surgery on 09/13/2017.  Was taking doxazosin and finasteride at that time.  Per notes from Dr. StoioffBernardo Heaterteride and doxazosin are Rx'd by PCP and may continue to be prescribed by PCP - we will provide refills today.  Pt did not realize that he was out of his doxazosin - according to our records he has likely been out for about a month; would like to restart and has tolerated very well in the past.  Spinal Stenosis of Lumbar Spine: Had bilateral L3-5 Laminectomy/Foraminotomy performed by Dr. Cram onSaintclair Halsted/2019.  Developed subsequent constipation and urinary retention, was seen in ER 09/19/17 and then followed up with urology as discussed above.  He has been doing well, no longer having urinary retention, constipation has resolved, denies pain, states scar has been healing well without concern for infection.  He restarted walking last week - walking daily with  no incline for about 1-2 miles. Follow up is scheduled 12/04/2017.  Patient Active Problem List   Diagnosis Date Noted  . Spinal stenosis of lumbar region 09/13/2017  . Spinal stenosis of lumbar region with neurogenic claudication 05/03/2017  . Type 2 diabetes mellitus without complication, with long-term current use of insulin ([place]) 05/03/2017  . Hyperkalemia 04/18/2017  . Medicare annual wellness visit, subsequent 11/28/2016  . Left hip pain  07/28/2016  . ALT (SGPT) level raised 07/13/2015  . Abdominal discomfort in right lower quadrant 07/13/2015  . At risk for falling 07/13/2015  . Arthritis of hand, degenerative 07/13/2015  . Gout 07/13/2015  . Glaucoma 07/13/2015  . Diabetic neuropathy ([place]) 07/13/2015  . Hypertension 04/09/2015  . Dyslipidemia 04/09/2015  . Hypertriglyceridemia 04/09/2015  . Benign fibroma of prostate 04/09/2015  . Acid reflux 04/09/2015  . Cutaneous malignant melanoma ([place]) 04/09/2015  . History of MI (myocardial infarction) 08/27/1979    Past Surgical History:  Procedure Laterality Date  . CATARACT EXTRACTION W/ INTRAOCULAR LENS  IMPLANT, BILATERAL    . CIRCUMCISION    . GANGLION CYST EXCISION     LEFT HAND  . LUMBAR LAMINECTOMY/DECOMPRESSION MICRODISCECTOMY  09/13/2017   Procedure: BILATERAL Lumbar three-four , Left  Lumbar four-five  AND Left  L5-S1, LAMINECTOMY/FORAMINOTOMY;  Surgeon: [name], MD;  Location: [facility];  Service: Neurosurgery;;  . MELANOMA EXCISION  07/2011  . TONSILLECTOMY    . VASECTOMY      Family History  Problem Relation Age of Onset  . Heart disease Mother   . Diabetes Mother        type 2  . Angina Mother   . Heart disease Brother   . Pneumonia Father   . Heart attack Father     Social History   Socioeconomic History  . Marital status: Married    Spouse name: [name]  . Number of children: 4  . Years of education: Not on file  . Highest education level: Bachelor's degree (e.g., BA, AB, BS)  Social Needs  . Financial resource strain: Not hard at all  . Food insecurity - worry: Never true  . Food insecurity - inability: Never true  . Transportation needs - medical: No  . Transportation needs - non-medical: No  Occupational History  . Occupation: Retired  Tobacco Use  . Smoking status: Former Smoker    Packs/day: 2.00    Years: 13.00    Pack years: 26.00    Types: Cigarettes    Last attempt to quit: 1963    Years since quitting: 56.2  . Smokeless  tobacco: Never Used  . Tobacco comment: smoking cessation materials not required  Substance and Sexual Activity  . Alcohol use: Yes    Alcohol/week: 1.2 oz    Types: 2 Shots of liquor per week    Comment: occasional  . Drug use: No  . Sexual activity: Not Currently  Other Topics Concern  . Not on file  Social History Narrative  . Not on file     Current Outpatient Medications:  .  ACCU-CHEK FASTCLIX LANCETS MISC, CHECK  BLOOD  SUGAR TWICE DAILY, Disp: 204 each, Rfl: 1 .  allopurinol (ZYLOPRIM) 100 MG tablet, Take 1 tablet (100 mg total) by mouth 2 (two) times daily., Disp: 180 tablet, Rfl: 1 .  amLODipine (NORVASC) 5 MG tablet, Take 1 tablet (5 mg total) by mouth daily., Disp: 90 tablet, Rfl: 1 .  aspirin 81 MG tablet, Take 81 mg by mouth., Disp: , Rfl:  .  atorvastatin (LIPITOR) 10 MG tablet, TAKE 1 TABLET DAILY AT 6 PM., Disp: 90 tablet, Rfl: 1 .  Blood Glucose Monitoring Suppl (ACCU-CHEK NANO SMARTVIEW) W/DEVICE KIT, USE AS DIRECTED, Disp: 1 kit, Rfl: 0 .  Cholecalciferol (VITAMIN D) 2000 units tablet, Take 2,000 Units by mouth daily., Disp: , Rfl:  .  Cobalamine Combinations (B12 FOLATE PO), Take 1 tablet by mouth daily., Disp: , Rfl:  .  dorzolamide (TRUSOPT) 2 % ophthalmic solution, , Disp: , Rfl:  .  doxazosin (CARDURA) 4 MG tablet, Take 1 tablet (4 mg total) by mouth at bedtime., Disp: 90 tablet, Rfl: 1 .  finasteride (PROSCAR) 5 MG tablet, Take 1 tablet (5 mg total) by mouth at bedtime., Disp: 90 tablet, Rfl: 1 .  glucose blood (COOL BLOOD GLUCOSE TEST STRIPS) test strip, Use as instructed, Disp: 100 each, Rfl: 12 .  Insulin Glargine (LANTUS SOLOSTAR) 100 UNIT/ML Solostar Pen, Inject 50 Units into the skin at bedtime. , Disp: , Rfl:  .  Insulin Pen Needle (B-D ULTRAFINE III SHORT PEN) 31G X 8 MM MISC, USE AS DIRECTED WITH INSULIN PEN., Disp: 90 each, Rfl: 0 .  lisinopril (PRINIVIL,ZESTRIL) 40 MG tablet, Take 1 tablet (40 mg total) by mouth daily., Disp: 90 tablet, Rfl: 0 .   metFORMIN (GLUCOPHAGE) 1000 MG tablet, Take 1 tablet (1,000 mg total) by mouth 2 (two) times daily with a meal., Disp: 180 tablet, Rfl: 1 .  Multiple Vitamin (DAILY VITAMINS) tablet, Take by mouth., Disp: , Rfl:  .  Multiple Vitamins-Minerals (CENTRUM SILVER 50+MEN) TABS, Take 1 tablet by mouth daily., Disp: , Rfl:  .  NEEDLE, DISP, 14 G 14G X 1" MISC, , Disp: , Rfl:  .  omega-3 fish oil (MAXEPA) 1000 MG CAPS capsule, Take 1 capsule by mouth daily., Disp: , Rfl:  .  ranitidine (ZANTAC) 150 MG capsule, Take 1 capsule (150 mg total) by mouth daily as needed., Disp: 90 capsule, Rfl: 0 .  timolol (TIMOPTIC) 0.5 % ophthalmic solution, Frequency:QD   Dosage:0.0     Instructions:  Note:Dose: 0.5 %, OS, Disp: , Rfl:  .  vitamin B-12 (CYANOCOBALAMIN) 500 MCG tablet, Take 1 tablet by mouth daily., Disp: , Rfl:  .  cyclobenzaprine (FLEXERIL) 10 MG tablet, Take 1 tablet (10 mg total) by mouth 3 (three) times daily as needed for muscle spasms. (Patient not taking: Reported on 11/20/2017), Disp: 30 tablet, Rfl: 0 .  HYDROcodone-acetaminophen (NORCO/VICODIN) 5-325 MG tablet, Take 1 tablet by mouth every 4 (four) hours as needed for moderate pain. (Patient not taking: Reported on 11/07/2017), Disp: 20 tablet, Rfl: 0  Allergies  Allergen Reactions  . Tetracyclines & Related     UNSPECIFIED REACTION     ROS Constitutional: Negative for fever or weight change.  Respiratory: Negative for cough and shortness of breath.   Cardiovascular: Negative for chest pain or palpitations.  Gastrointestinal: Negative for abdominal pain, no bowel changes.  Musculoskeletal: Negative for gait problem or joint swelling.  Skin: Negative for rash.  Neurological: Negative for  headache.  No other specific complaints in a complete review of systems (except as listed in HPI above).  Objective  Vitals:   11/20/17 0816  BP: 130/70  Pulse: 97  Resp: 16  Temp: 97.7 F (36.5 C)  TempSrc: Oral  SpO2: 97%  Weight: 176 lb 12.8 oz  (80.2 kg)  Height: 5' 10"  (1.778 m)   Body mass index is 25.37 kg/m.  Physical Exam Constitutional: Patient appears well-developed and well-nourished. No  distress.  HENT: Head: Normocephalic and atraumatic. Nose: Nose normal. Mouth/Throat: Oropharynx is clear and moist. No oropharyngeal exudate.  Eyes: Conjunctivae and EOM are normal. No scleral icterus.  Neck: Normal range of motion. Neck supple. No JVD present. Cardiovascular: Normal rate, regular rhythm and normal heart sounds.  No murmur heard. No BLE edema. Pulmonary/Chest: Effort normal and breath sounds normal. No respiratory distress. Abdominal: Soft. Bowel sounds are normal, no distension. There is no tenderness. no masses Musculoskeletal: Normal range of motion, no joint effusions. No gross deformities Neurological: he is alert and oriented to person, place, and time. No cranial nerve deficit. Coordination, balance, strength, speech and gait are normal.  Skin: Skin is warm and dry. No rash noted. No erythema. Surgical Scar - verticle along low back is non-tender, well-approximated at the edges, no exudate and appears to be healing without issue. Psychiatric: Patient has a normal mood and affect. behavior is normal. Judgment and thought content normal.  No results found for this or any previous visit (from the past 72 hour(s)).  PHQ2/9: Depression screen [facility] 2/9 11/07/2017 11/07/2017 10/19/2017 10/06/2017 08/07/2017  Decreased Interest 0 0 0 0 0  Down, Depressed, Hopeless 0 0 0 0 0  PHQ - 2 Score 0 0 0 0 0  Altered sleeping 0 - - - -  Tired, decreased energy 0 - - - -  Change in appetite 0 - - - -  Feeling bad or failure about yourself  0 - - - -  Trouble concentrating 0 - - - -  Moving slowly or fidgety/restless 0 - - - -  Suicidal thoughts 0 - - - -  PHQ-9 Score 0 - - - -   Fall Risk: Fall Risk  11/07/2017 10/19/2017 10/06/2017 08/07/2017 06/27/2017  Falls in the past year? No No No No No  Risk for fall due to : Medication  side effect;Impaired vision - - - -  Risk for fall due to: Comment wears eyeglasses - - - -   Assessment & Plan   1. Essential hypertension - doxazosin (CARDURA) 4 MG tablet; Take 1 tablet (4 mg total) by mouth at bedtime.  Dispense: 90 tablet; Refill: 1 - lisinopril (PRINIVIL,ZESTRIL) 40 MG tablet; Take 1 tablet (40 mg total) by mouth daily.  Dispense: 90 tablet; Refill: 1 - amLODipine (NORVASC) 5 MG tablet; Take 1 tablet (5 mg total) by mouth daily.  Dispense: 90 tablet; Refill: 1 - Check BP a few times a week and/or if feeling lightheaded/dizzy as he is restarting doxazosin.  2. Type 2 diabetes mellitus without complication, with long-term current use of insulin ([facility]) - metFORMIN (GLUCOPHAGE) 1000 MG tablet; Take 1 tablet (1,000 mg total) by mouth 2 (two) times daily with a meal.  Dispense: 180 tablet; Refill: 1 - continue Lantus 50units - Goal blood sugar in the morning (fasting) is between 100-150.   - Stop glipizide, continue lantus and metformin as prescribed.  If morning blood sugar is going above 200, pt will call and we will adjust Lantus and/or metformin. - Not due for A1C check today - will follow up in 1 month and check then  3. Chronic gout of right foot, unspecified cause - Continue Allopurinol; stable  4. Benign fibroma of prostate - doxazosin (CARDURA) 4 MG tablet; Take 1 tablet (4 mg total) by mouth at bedtime.  Dispense: 90 tablet; Refill: 1 - finasteride (PROSCAR) 5 MG tablet; Take 1 tablet (5 mg total) by mouth at bedtime.  Dispense: 90 tablet; Refill: 1  5. Hypertriglyceridemia - Continue Atorvastatin and lifestyle changes; stable  6. Gastroesophageal reflux disease, esophagitis presence not specified - Avoid triggers. - ranitidine (ZANTAC) 150 MG capsule; Take 1 capsule (150 mg total) by mouth daily as needed.  Dispense: 90 capsule; Refill: 1  7. Spinal stenosis of lumbar region, unspecified whether neurogenic claudication present - Stable, healing surgical  scar, keep follow up with Dr. Saintclair Halsted

## 2017-11-21 NOTE — Telephone Encounter (Signed)
Left message for medical records at [facility] for last office note to be faxed

## 2017-11-23 ENCOUNTER — Encounter: Payer: Self-pay | Admitting: Family Medicine

## 2017-12-21 ENCOUNTER — Ambulatory Visit: Payer: Medicare HMO | Admitting: Nurse Practitioner

## 2017-12-21 ENCOUNTER — Encounter: Payer: Self-pay | Admitting: Nurse Practitioner

## 2017-12-21 VITALS — BP 144/68 | HR 90 | Temp 97.8°F | Resp 18 | Ht 70.0 in | Wt 176.7 lb

## 2017-12-21 DIAGNOSIS — Z794 Long term (current) use of insulin: Secondary | ICD-10-CM | POA: Diagnosis not present

## 2017-12-21 DIAGNOSIS — I10 Essential (primary) hypertension: Secondary | ICD-10-CM

## 2017-12-21 DIAGNOSIS — K219 Gastro-esophageal reflux disease without esophagitis: Secondary | ICD-10-CM | POA: Diagnosis not present

## 2017-12-21 DIAGNOSIS — E875 Hyperkalemia: Secondary | ICD-10-CM | POA: Diagnosis not present

## 2017-12-21 DIAGNOSIS — E119 Type 2 diabetes mellitus without complications: Secondary | ICD-10-CM | POA: Diagnosis not present

## 2017-12-21 DIAGNOSIS — R197 Diarrhea, unspecified: Secondary | ICD-10-CM | POA: Diagnosis not present

## 2017-12-21 DIAGNOSIS — R740 Nonspecific elevation of levels of transaminase and lactic acid dehydrogenase [LDH]: Secondary | ICD-10-CM | POA: Diagnosis not present

## 2017-12-21 DIAGNOSIS — R7401 Elevation of levels of liver transaminase levels: Secondary | ICD-10-CM

## 2017-12-21 MED ORDER — AMLODIPINE BESYLATE 5 MG PO TABS: 10.0000 mg | ORAL_TABLET | Freq: Every day | ORAL | 1 refills | Status: DC

## 2017-12-21 MED ORDER — PANTOPRAZOLE SODIUM 20 MG PO TBEC: 20.0000 mg | DELAYED_RELEASE_TABLET | Freq: Every day | ORAL | 0 refills | Status: DC

## 2017-12-21 NOTE — Patient Instructions (Addendum)
- Continue taking the ranitidine at night time - Take the new medicine, Protonix, every day for one month - Avoid acid foods, do not eat 2 hours before laying flat; use the bed wedge to keep yourself 30 degrees at night - take 10mg  of amlodipine daily instead of 5mg  and continue to check your blood pressures; our goal for you is less than 140/80 -follow up in 2 weeks to ensure your blood pressure is better controlled; follow-up on labs and GI symptoms  Gastroesophageal Reflux Disease, Adult Normally, food travels down the esophagus and stays in the stomach to be digested. If a person has gastroesophageal reflux disease (GERD), food and stomach acid move back up into the esophagus. When this happens, the esophagus becomes sore and swollen (inflamed). Over time, GERD can make small holes (ulcers) in the lining of the esophagus. Follow these instructions at home: Diet  Follow a diet as told by your doctor. You may need to avoid foods and drinks such as: ? Coffee and tea (with or without caffeine). ? Drinks that contain alcohol. ? Energy drinks and sports drinks. ? Carbonated drinks or sodas. ? Chocolate and cocoa. ? Peppermint and mint flavorings. ? Garlic and onions. ? Horseradish. ? Spicy and acidic foods, such as peppers, chili powder, curry powder, vinegar, hot sauces, and BBQ sauce. ? Citrus fruit juices and citrus fruits, such as oranges, lemons, and limes. ? Tomato-based foods, such as red sauce, chili, salsa, and pizza with red sauce. ? Fried and fatty foods, such as donuts, french fries, potato chips, and high-fat dressings. ? High-fat meats, such as hot dogs, rib eye steak, sausage, ham, and bacon. ? High-fat dairy items, such as whole milk, butter, and cream cheese.  Eat small meals often. Avoid eating large meals.  Avoid drinking large amounts of liquid with your meals.  Avoid eating meals during the 2-3 hours before bedtime.  Avoid lying down right after you eat.  Do not  exercise right after you eat. General instructions  Pay attention to any changes in your symptoms.  Take over-the-counter and prescription medicines only as told by your doctor. Do not take aspirin, ibuprofen, or other NSAIDs unless your doctor says it is okay.  Do not use any tobacco products, including cigarettes, chewing tobacco, and e-cigarettes. If you need help quitting, ask your doctor.  Wear loose clothes. Do not wear anything tight around your waist.  Raise (elevate) the head of your bed about 6 inches (15 cm).  Try to lower your stress. If you need help doing this, ask your doctor.  If you are overweight, lose an amount of weight that is healthy for you. Ask your doctor about a safe weight loss goal.  Keep all follow-up visits as told by your doctor. This is important. Contact a doctor if:  You have new symptoms.  You lose weight and you do not know why it is happening.  You have trouble swallowing, or it hurts to swallow.  You have wheezing or a cough that keeps happening.  Your symptoms do not get better with treatment.  You have a hoarse voice. Get help right away if:  You have pain in your arms, neck, jaw, teeth, or back.  You feel sweaty, dizzy, or light-headed.  You have chest pain or shortness of breath.  You throw up (vomit) and your throw up looks like blood or coffee grounds.  You pass out (faint).  Your poop (stool) is bloody or black.  You cannot swallow,  drink, or eat. This information is not intended to replace advice given to you by your health care provider. Make sure you discuss any questions you have with your health care provider. Document Released: 02/15/2008 Document Revised: 02/04/2016 Document Reviewed: 12/24/2014 Elsevier Interactive Patient Education  [employer].

## 2017-12-21 NOTE — Progress Notes (Addendum)
Name: Tracy [name]   MRN: [id number]    DOB: [birth year]   Date:12/21/2017       Progress Note  Subjective  Chief Complaint  Chief Complaint  Patient presents with  . Diabetes  . Gastroesophageal Reflux    worse since surgery    HPI  Patient had bilateral lumbar laminectomy/foraminotomy in 09/13/2017 and states ever since then he has not felt right. States GERD has been more severe and has had bowel changes- initially more constipated went to ED but since then has had more loose stool; bristol scale 5&6. Patient states has had two episodes of stool incontinence when he was sleeping- states woke up and had defecated in the bed 3/31 and then another time earlier in march. Denies back pain, injury, paresthesia. States GERD has been acting up at night. Patient states has 1-2 alcoholic drinks at night with citrus juice and has cut that out but still having symptoms- burning sensation in throat. Patient states woke on 4/8 due to severe burning sensation and couldn't get a deep breath felt he had a lid on his esophagus. He sat up and sensation lasted 4-5 seconds and then resolved. Patient denies chest pain, dysphagia, cough, abdominal pain, nausea, vomiting, unintentional weight loss, fever, chills.   HTN Patient keeps blood pressure log since 3/12: 124-178/70-92. Takes amlodipine 40m at night and lisinopril 453mduring the day. Denies headaches, blurry vision, headaches, slurred speech, paresthesias, no edema.   BP Readings from Last 3 Encounters:  12/21/17 (!) 144/68  11/20/17 130/70  11/07/17 138/60     HLD Atorvastatin 1082mver night. No myalgias. Lab Results  Component Value Date   CHOL 82 10/10/2017   HDL 43 10/10/2017   LDLCALC 21 10/10/2017   TRIG 96 10/10/2017   CHOLHDL 1.9 10/10/2017    Diabetes Fasting blood sugars- 97-192 but mostly around 130-140's. He does note that higher glucose readings are related to what he had for dinner- often rice, pizza or potatoes.  He takes  metformin 1000m36m and 50units of lantus at night. Patient denies polyuria, polydipsia, polyphagia.   Patient Active Problem List   Diagnosis Date Noted  . Spinal stenosis of lumbar region 09/13/2017  . Spinal stenosis of lumbar region with neurogenic claudication 05/03/2017  . Type 2 diabetes mellitus without complication, with long-term current use of insulin (HCC)[place]/22/2018  . Hyperkalemia 04/18/2017  . Medicare annual wellness visit, subsequent 11/28/2016  . Left hip pain 07/28/2016  . ALT (SGPT) level raised 07/13/2015  . Abdominal discomfort in right lower quadrant 07/13/2015  . At risk for falling 07/13/2015  . Arthritis of hand, degenerative 07/13/2015  . Gout 07/13/2015  . Glaucoma 07/13/2015  . Diabetic neuropathy (HCC)[place]/31/2016  . Hypertension 04/09/2015  . Dyslipidemia 04/09/2015  . Hypertriglyceridemia 04/09/2015  . Benign fibroma of prostate 04/09/2015  . Acid reflux 04/09/2015  . 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 SCC (squamous cell carcinoma), scalp/neck 05/23/2013  . H/O adenomatous polyp of colon 08/25/2010  . History of MI (myocardial infarction) 08/27/1979    Past Medical History:  Diagnosis Date  . Arthritis   . [facility]  . Diabetes mellitus without complication (HCC)[place]. GERD (gastroesophageal reflux disease)   . Hypertension   . [facility]    Past Surgical History:  Procedure Laterality Date  . CATARACT EXTRACTION W/ INTRAOCULAR LENS  IMPLANT, BILATERAL    . CIRCUMCISION    . GANGLION  CYST EXCISION     LEFT HAND  . LUMBAR LAMINECTOMY/DECOMPRESSION MICRODISCECTOMY  09/13/2017   Procedure: BILATERAL Lumbar three-four , Left  Lumbar four-five  AND Left  L5-S1, LAMINECTOMY/FORAMINOTOMY;  Surgeon: [name], MD;  Location: [facility];  Service: Neurosurgery;;  . MELANOMA EXCISION  07/2011  . TONSILLECTOMY    . VASECTOMY      Social History   Tobacco Use  . Smoking status: Former  Smoker    Packs/day: 2.00    Years: 13.00    Pack years: 26.00    Types: Cigarettes    Last attempt to quit: 1963    Years since quitting: 56.3  . Smokeless tobacco: Never Used  . Tobacco comment: smoking cessation materials not required  Substance Use Topics  . Alcohol use: Yes    Alcohol/week: 1.2 oz    Types: 2 Shots of liquor per week    Comment: occasional     Current Outpatient Medications:  .  ACCU-CHEK FASTCLIX LANCETS MISC, CHECK  BLOOD  SUGAR TWICE DAILY, Disp: 204 each, Rfl: 1 .  allopurinol (ZYLOPRIM) 100 MG tablet, Take 1 tablet (100 mg total) by mouth 2 (two) times daily., Disp: 180 tablet, Rfl: 1 .  amLODipine (NORVASC) 5 MG tablet, Take 1 tablet (5 mg total) by mouth daily., Disp: 90 tablet, Rfl: 1 .  aspirin 81 MG tablet, Take 81 mg by mouth., Disp: , Rfl:  .  atorvastatin (LIPITOR) 10 MG tablet, TAKE 1 TABLET DAILY AT 6 PM., Disp: 90 tablet, Rfl: 1 .  Blood Glucose Monitoring Suppl (ACCU-CHEK NANO SMARTVIEW) W/DEVICE KIT, USE AS DIRECTED, Disp: 1 kit, Rfl: 0 .  Cholecalciferol (VITAMIN D) 2000 units tablet, Take 2,000 Units by mouth daily., Disp: , Rfl:  .  Cobalamine Combinations (B12 FOLATE PO), Take 1 tablet by mouth daily., Disp: , Rfl:  .  dorzolamide (TRUSOPT) 2 % ophthalmic solution, , Disp: , Rfl:  .  doxazosin (CARDURA) 4 MG tablet, Take 1 tablet (4 mg total) by mouth at bedtime., Disp: 90 tablet, Rfl: 1 .  finasteride (PROSCAR) 5 MG tablet, Take 1 tablet (5 mg total) by mouth at bedtime., Disp: 90 tablet, Rfl: 1 .  glucose blood (COOL BLOOD GLUCOSE TEST STRIPS) test strip, Use as instructed, Disp: 100 each, Rfl: 12 .  HYDROcodone-acetaminophen (NORCO/VICODIN) 5-325 MG tablet, Take 1 tablet by mouth every 4 (four) hours as needed for moderate pain., Disp: 20 tablet, Rfl: 0 .  Insulin Glargine (LANTUS SOLOSTAR) 100 UNIT/ML Solostar Pen, Inject 50 Units into the skin at bedtime. , Disp: , Rfl:  .  Insulin Pen Needle (B-D ULTRAFINE III SHORT PEN) 31G X 8 MM  MISC, USE AS DIRECTED WITH INSULIN PEN., Disp: 90 each, Rfl: 0 .  lisinopril (PRINIVIL,ZESTRIL) 40 MG tablet, Take 1 tablet (40 mg total) by mouth daily., Disp: 90 tablet, Rfl: 1 .  metFORMIN (GLUCOPHAGE) 1000 MG tablet, Take 1 tablet (1,000 mg total) by mouth 2 (two) times daily with a meal., Disp: 180 tablet, Rfl: 1 .  Multiple Vitamin (DAILY VITAMINS) tablet, Take by mouth., Disp: , Rfl:  .  Multiple Vitamins-Minerals (CENTRUM SILVER 50+MEN) TABS, Take 1 tablet by mouth daily., Disp: , Rfl:  .  NEEDLE, DISP, 14 G 14G X 1" MISC, , Disp: , Rfl:  .  omega-3 fish oil (MAXEPA) 1000 MG CAPS capsule, Take 1 capsule by mouth daily., Disp: , Rfl:  .  ranitidine (ZANTAC) 150 MG capsule, Take 1 capsule (150 mg total) by mouth daily as  needed., Disp: 90 capsule, Rfl: 1 .  timolol (TIMOPTIC) 0.5 % ophthalmic solution, Frequency:QD   Dosage:0.0     Instructions:  Note:Dose: 0.5 %, OS, Disp: , Rfl:  .  vitamin B-12 (CYANOCOBALAMIN) 500 MCG tablet, Take 1 tablet by mouth daily., Disp: , Rfl:   Allergies  Allergen Reactions  . Tetracyclines & Related Other (See Comments)    UNSPECIFIED REACTION  other    ROS  No other specific complaints in a complete review of systems (except as listed in HPI above).  Objective  Vitals:   12/21/17 0854  BP: (!) 144/68  Pulse: 90  Resp: 18  Temp: 97.8 F (36.6 C)  TempSrc: Oral  SpO2: 95%  Weight: 176 lb 11.2 oz (80.2 kg)  Height: 5' 10"  (1.778 m)    Body mass index is 25.35 kg/m.  Nursing Note and Vital Signs reviewed.  Physical Exam  Constitutional: Patient appears well-developed and well-nourished. No distress.  HEENT: head atraumatic, normocephalic, conjunctiva clear, pupils equal, round, neck supple without thyroimegal, no carotid bruits Cardiovascular: Normal rate, regular rhythm, S1/S2 present.  No murmur or rub heard. Radial pulses intact, no edema noted.  Pulmonary/Chest: Effort normal and breath sounds clear. No respiratory distress or  retractions. Abdominal: Soft and non-tender, bowel sounds present  Psychiatric: Patient has a normal mood and affect. behavior is normal. Judgment and thought content normal.  Assessment & Plan  Patient presents for routine follow-up of htn, dm2, hld in addition notes worsening GERD and unrelieved diarrhea for 3 months with two episodes of nocturnal stool incontinence without back pain or paresthesias. Discussed case with Neurosurgeon- Dr. Nydia Bouton who notes likely GI without the presence of LBP or paresthesia- has routine follow-up in 10 days. Will add protonix, discussed diet and other non-pharm improvements to reduce GERD will send to GI for further management of GERD and loose stools. BP logs note chronically elevated BP; and elevated in office will increase amlodipine and follow-up on BP and loose stools in 2 weeks.    1. Essential hypertension Mildly-elevated today and on blood pressure log; will increase amlodipine and follow up in  2 weeks  - COMPLETE METABOLIC PANEL WITH GFR  2. Type 2 diabetes mellitus without complication, with long-term current use of insulin (HCC) - stable, continue current regimen  - HgB A1c  3. ALT (SGPT) level raised - COMPLETE METABOLIC PANEL WITH GFR  4. Hyperkalemia - COMPLETE METABOLIC PANEL WITH GFR  5. Gastroesophageal reflux disease, esophagitis presence not specified - Continue taking the ranitidine at night time - Take the new medicine, Protonix, every day for one month - Avoid acid foods, do not eat 2 hours before laying flat; use the bed wedge to keep yourself 30 degrees at night - pantoprazole (PROTONIX) 20 MG tablet; Take 1 tablet (20 mg total) by mouth daily.  Dispense: 30 tablet; Refill: 0  6. Diarrhea, unspecified type  - Ambulatory referral to Gastroenterology - CBC with Differential   -Red flags and when to present for emergency care or RTC including fever >101.30F, chest pain, shortness of breath, new/worsening/un-resolving  symptoms, Stool/urinary incontinence, paresthesia reviewed with patient at time of visit. Follow up and care instructions discussed and provided in AVS.   ------------------------------------- I have reviewed this encounter including the documentation in this note and/or discussed this patient with the provider, Suezanne Cheshire DNP AGNP-C. I am certifying that I agree with the content of this note as supervising physician. Enid Derry, MD [facility] 12/23/2017, 1:47 PM

## 2017-12-22 LAB — COMPLETE METABOLIC PANEL WITH GFR
AG Ratio: 1.9 (calc) (ref 1.0–2.5)
ALKALINE PHOSPHATASE (APISO): 46 U/L (ref 40–115)
ALT: 39 U/L (ref 9–46)
AST: 27 U/L (ref 10–35)
Albumin: 4.2 g/dL (ref 3.6–5.1)
BUN: 19 mg/dL (ref 7–25)
CALCIUM: 9.3 mg/dL (ref 8.6–10.3)
CO2: 28 mmol/L (ref 20–32)
CREATININE: 1.03 mg/dL (ref 0.70–1.11)
Chloride: 103 mmol/L (ref 98–110)
GFR, EST NON AFRICAN AMERICAN: 66 mL/min/{1.73_m2} (ref >=60)
GFR, Est African American: 76 mL/min/{1.73_m2} (ref >=60)
GLUCOSE: 286 mg/dL — AB (ref 65–139)
Globulin: 2.2 g/dL (calc) (ref 1.9–3.7)
POTASSIUM: 4.5 mmol/L (ref 3.5–5.3)
Sodium: 137 mmol/L (ref 135–146)
Total Bilirubin: 0.5 mg/dL (ref 0.2–1.2)
Total Protein: 6.4 g/dL (ref 6.1–8.1)

## 2017-12-22 LAB — CBC WITH DIFFERENTIAL/PLATELET
BASOS PCT: 1.4 %
Basophils Absolute: 70 cells/uL (ref 0–200)
Eosinophils Absolute: 260 cells/uL (ref 15–500)
Eosinophils Relative: 5.2 %
HCT: 38 % — ABNORMAL LOW (ref 38.5–50.0)
HEMOGLOBIN: 13.5 g/dL (ref 13.2–17.1)
Lymphs Abs: 1740 cells/uL (ref 850–3900)
MCH: 33.1 pg — ABNORMAL HIGH (ref 27.0–33.0)
MCHC: 35.5 g/dL (ref 32.0–36.0)
MCV: 93.1 fL (ref 80.0–100.0)
MONOS PCT: 10.3 %
MPV: 11.5 fL (ref 7.5–12.5)
NEUTROS ABS: 2415 {cells}/uL (ref 1500–7800)
Neutrophils Relative %: 48.3 %
PLATELETS: 134 10*3/uL — AB (ref 140–400)
RBC: 4.08 10*6/uL — AB (ref 4.20–5.80)
RDW: 13.6 % (ref 11.0–15.0)
Total Lymphocyte: 34.8 %
WBC: 5 10*3/uL (ref 3.8–10.8)
WBCMIX: 515 {cells}/uL (ref 200–950)

## 2017-12-22 LAB — HEMOGLOBIN A1C
EAG (MMOL/L): 9.3 (calc)
Hgb A1c MFr Bld: 7.5 % of total Hgb — ABNORMAL HIGH (ref <5.7)
MEAN PLASMA GLUCOSE: 169 (calc)

## 2018-01-04 ENCOUNTER — Ambulatory Visit (INDEPENDENT_AMBULATORY_CARE_PROVIDER_SITE_OTHER): Payer: Medicare HMO | Admitting: Family Medicine

## 2018-01-04 ENCOUNTER — Encounter: Payer: Medicare HMO | Admitting: Family Medicine

## 2018-01-04 VITALS — BP 148/72

## 2018-01-04 DIAGNOSIS — E119 Type 2 diabetes mellitus without complications: Secondary | ICD-10-CM

## 2018-01-04 DIAGNOSIS — I10 Essential (primary) hypertension: Secondary | ICD-10-CM | POA: Diagnosis not present

## 2018-01-04 DIAGNOSIS — Z794 Long term (current) use of insulin: Secondary | ICD-10-CM

## 2018-01-04 NOTE — Progress Notes (Signed)
Patient came in for BP check and it was 148/72. He stated that it had been running  that at home 151/72, 142/76. His concern today was his BS was elevated. His Glipizide 10 mg was stopped at last visit but he was told per lab results on this week to increase his Lantus by 2 units and he was now up to 52 units. Spoke to Dr, Ancil Boozer and patient was informed to start his Glipizide back 10 mg qd and see Endocrinology. Patient has appointment here on 01/08/18 until he can get in with Endocrinology to discuss

## 2018-01-08 ENCOUNTER — Ambulatory Visit: Payer: Medicare HMO | Admitting: Family Medicine

## 2018-01-12 ENCOUNTER — Ambulatory Visit: Payer: Medicare HMO | Admitting: Gastroenterology

## 2018-01-12 ENCOUNTER — Ambulatory Visit: Payer: Medicare HMO | Admitting: Nurse Practitioner

## 2018-01-12 DIAGNOSIS — E119 Type 2 diabetes mellitus without complications: Secondary | ICD-10-CM | POA: Diagnosis not present

## 2018-01-12 DIAGNOSIS — I10 Essential (primary) hypertension: Secondary | ICD-10-CM | POA: Diagnosis not present

## 2018-01-12 DIAGNOSIS — Z794 Long term (current) use of insulin: Secondary | ICD-10-CM | POA: Diagnosis not present

## 2018-01-12 LAB — BASIC METABOLIC PANEL: GLUCOSE: 256

## 2018-01-12 LAB — HEMOGLOBIN A1C: Hgb A1c MFr Bld: 7.9 — AB (ref 4.0–6.0)

## 2018-01-15 DIAGNOSIS — H02054 Trichiasis without entropian left upper eyelid: Secondary | ICD-10-CM | POA: Diagnosis not present

## 2018-01-15 DIAGNOSIS — H401132 Primary open-angle glaucoma, bilateral, moderate stage: Secondary | ICD-10-CM | POA: Diagnosis not present

## 2018-01-22 ENCOUNTER — Ambulatory Visit (INDEPENDENT_AMBULATORY_CARE_PROVIDER_SITE_OTHER): Payer: Medicare HMO | Admitting: Family Medicine

## 2018-01-22 ENCOUNTER — Encounter: Payer: Self-pay | Admitting: Family Medicine

## 2018-01-22 VITALS — BP 140/70 | HR 70 | Resp 14 | Ht 70.0 in | Wt 177.9 lb

## 2018-01-22 DIAGNOSIS — E1121 Type 2 diabetes mellitus with diabetic nephropathy: Secondary | ICD-10-CM | POA: Diagnosis not present

## 2018-01-22 DIAGNOSIS — I252 Old myocardial infarction: Secondary | ICD-10-CM | POA: Diagnosis not present

## 2018-01-22 DIAGNOSIS — E785 Hyperlipidemia, unspecified: Secondary | ICD-10-CM | POA: Diagnosis not present

## 2018-01-22 DIAGNOSIS — E781 Pure hyperglyceridemia: Secondary | ICD-10-CM | POA: Diagnosis not present

## 2018-01-22 DIAGNOSIS — Z9889 Other specified postprocedural states: Secondary | ICD-10-CM

## 2018-01-22 DIAGNOSIS — M1A0710 Idiopathic chronic gout, right ankle and foot, without tophus (tophi): Secondary | ICD-10-CM

## 2018-01-22 DIAGNOSIS — D696 Thrombocytopenia, unspecified: Secondary | ICD-10-CM

## 2018-01-22 DIAGNOSIS — Z794 Long term (current) use of insulin: Secondary | ICD-10-CM | POA: Diagnosis not present

## 2018-01-22 DIAGNOSIS — K219 Gastro-esophageal reflux disease without esophagitis: Secondary | ICD-10-CM

## 2018-01-22 DIAGNOSIS — Z85820 Personal history of malignant melanoma of skin: Secondary | ICD-10-CM

## 2018-01-22 MED ORDER — ALLOPURINOL 100 MG PO TABS: 100.0000 mg | ORAL_TABLET | Freq: Two times a day (BID) | ORAL | 1 refills | Status: DC

## 2018-01-22 MED ORDER — ATORVASTATIN CALCIUM 10 MG PO TABS: ORAL_TABLET | ORAL | 1 refills | Status: DC

## 2018-01-22 NOTE — Progress Notes (Signed)
Name: Tracy [name]   MRN: [id number]    DOB: Nov 07, 1929   Date:01/22/2018       Progress Note  Subjective  Chief Complaint  Chief Complaint  Patient presents with  . Hypertension    He wants to know should he be taking two blood pressure medications. Amlodipine and Lisinopril   . Diabetes    Patient has concerns about his diabetes medications. He was evaluated by Endocrine and he started back on his Glipizide. When he stopped it his bs was went up. He started back on his Glipizide and his bs went down.  Marland Kitchen Hyperlipidemia  . Gastroesophageal Reflux    He has concerns about taking to much medication for his GERD.    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 He has been doing well since, denies chest pain, SOB or orthopnea.   DMII: he sees Dr. Kenton Kingfisher at [facility]. He had glipizide decreased by NP, but glucose went up and he went back to Endocrinologist and is back on Glipizide , lantus and metformin and glucose is back to goal. Denies polyphagia, polyuria or polyphagia. Urine micro is up to date   Hyperlipidemia: taking medication and denies side effects, LDL is at goal. No myalgia  GERD: symptoms severe after back surgery , took pantoprazole and is doing well now, he is back on ranitidine. Denies regurgitation, and heartburn has resolved.   HTN: under control with medication, no chest pain or decrease in exercise tolerance  Thrombocytopenia: reviewed labs labs.   Patient Active Problem List   Diagnosis Date Noted  . Spinal stenosis of lumbar region 09/13/2017  . Spinal stenosis of lumbar region with neurogenic claudication 05/03/2017  . Type 2 diabetes mellitus without complication, with long-term current use of insulin ([place]) 05/03/2017  . Hyperkalemia 04/18/2017  . Left hip pain 07/28/2016  . ALT (SGPT) level raised 07/13/2015  . At risk for falling 07/13/2015  . Arthritis of hand, degenerative 07/13/2015  . Gout  07/13/2015  . Glaucoma 07/13/2015  . Diabetic neuropathy ([place]) 07/13/2015  . Hypertension 04/09/2015  . Dyslipidemia 04/09/2015  . Hypertriglyceridemia 04/09/2015  . Benign fibroma of prostate 04/09/2015  . Acid reflux 04/09/2015  . History of melanoma excision 04/09/2015  . History of squamous cell carcinoma 05/23/2013  . H/O adenomatous polyp of colon 08/25/2010  . History of MI (myocardial infarction) 08/27/1979    Past Surgical History:  Procedure Laterality Date  . CATARACT EXTRACTION W/ INTRAOCULAR LENS  IMPLANT, BILATERAL    . CIRCUMCISION    . GANGLION CYST EXCISION     LEFT HAND  . LUMBAR LAMINECTOMY/DECOMPRESSION MICRODISCECTOMY  09/13/2017   Procedure: BILATERAL Lumbar three-four , Left  Lumbar four-five  AND Left  L5-S1, LAMINECTOMY/FORAMINOTOMY;  Surgeon: [name], MD;  Location: [facility];  Service: Neurosurgery;;  . MELANOMA EXCISION  07/2011  . TONSILLECTOMY    . VASECTOMY      Family History  Problem Relation Age of Onset  . Heart disease Mother   . Diabetes Mother        type 2  . Angina Mother   . Heart disease Brother   . Pneumonia Father   . Heart attack Father     Social History   Socioeconomic History  . Marital status: Married    Spouse name: [name]  . Number of children: 4  . Years of education: Not on file  . Highest education level: Bachelor's  degree (e.g., BA, AB, BS)  Occupational History  . Occupation: Retired  Scientific laboratory technician  . Financial resource strain: Not hard at all  . Food insecurity:    Worry: Never true    Inability: Never true  . Transportation needs:    Medical: No    Non-medical: No  Tobacco Use  . Smoking status: Former Smoker    Packs/day: 2.00    Years: 13.00    Pack years: 26.00    Types: Cigarettes    Last attempt to quit: 1963    Years since quitting: 56.4  . Smokeless tobacco: Never Used  . Tobacco comment: smoking cessation materials not required  Substance and Sexual Activity  . Alcohol use: Yes     Alcohol/week: 1.2 oz    Types: 2 Shots of liquor per week    Comment: occasional  . Drug use: No  . Sexual activity: Not Currently  Lifestyle  . Physical activity:    Days per week: 5 days    Minutes per session: 60 min  . Stress: Not at all  Relationships  . Social connections:    Talks on phone: Patient refused    Gets together: Patient refused    Attends religious service: Patient refused    Active member of club or organization: Patient refused    Attends meetings of clubs or organizations: Patient refused    Relationship status: Married  . Intimate partner violence:    Fear of current or ex partner: No    Emotionally abused: No    Physically abused: No    Forced sexual activity: No  Other Topics Concern  . Not on file  Social History Narrative  . Not on file     Current Outpatient Medications:  .  ACCU-CHEK FASTCLIX LANCETS MISC, CHECK  BLOOD  SUGAR TWICE DAILY, Disp: 204 each, Rfl: 1 .  allopurinol (ZYLOPRIM) 100 MG tablet, Take 1 tablet (100 mg total) by mouth 2 (two) times daily., Disp: 180 tablet, Rfl: 1 .  amLODipine (NORVASC) 5 MG tablet, Take 2 tablets (10 mg total) by mouth daily., Disp: 90 tablet, Rfl: 1 .  aspirin 81 MG tablet, Take 81 mg by mouth., Disp: , Rfl:  .  atorvastatin (LIPITOR) 10 MG tablet, TAKE 1 TABLET DAILY AT 6 PM., Disp: 90 tablet, Rfl: 1 .  Blood Glucose Monitoring Suppl (ACCU-CHEK NANO SMARTVIEW) W/DEVICE KIT, USE AS DIRECTED, Disp: 1 kit, Rfl: 0 .  Cholecalciferol (VITAMIN D) 2000 units tablet, Take 2,000 Units by mouth daily., Disp: , Rfl:  .  dorzolamide (TRUSOPT) 2 % ophthalmic solution, , Disp: , Rfl:  .  doxazosin (CARDURA) 4 MG tablet, Take 1 tablet (4 mg total) by mouth at bedtime., Disp: 90 tablet, Rfl: 1 .  finasteride (PROSCAR) 5 MG tablet, Take 1 tablet (5 mg total) by mouth at bedtime., Disp: 90 tablet, Rfl: 1 .  glucose blood (COOL BLOOD GLUCOSE TEST STRIPS) test strip, Use as instructed, Disp: 100 each, Rfl: 12 .  Insulin  Glargine (LANTUS SOLOSTAR) 100 UNIT/ML Solostar Pen, Inject 50 Units into the skin at bedtime. , Disp: , Rfl:  .  Insulin Pen Needle (B-D ULTRAFINE III SHORT PEN) 31G X 8 MM MISC, USE AS DIRECTED WITH INSULIN PEN., Disp: 90 each, Rfl: 0 .  lisinopril (PRINIVIL,ZESTRIL) 40 MG tablet, Take 1 tablet (40 mg total) by mouth daily., Disp: 90 tablet, Rfl: 1 .  metFORMIN (GLUCOPHAGE) 1000 MG tablet, Take 1 tablet (1,000 mg total) by mouth 2 (two)  times daily with a meal., Disp: 180 tablet, Rfl: 1 .  Multiple Vitamin (DAILY VITAMINS) tablet, Take by mouth., Disp: , Rfl:  .  Multiple Vitamins-Minerals (CENTRUM SILVER 50+MEN) TABS, Take 1 tablet by mouth daily., Disp: , Rfl:  .  NEEDLE, DISP, 14 G 14G X 1" MISC, , Disp: , Rfl:  .  omega-3 fish oil (MAXEPA) 1000 MG CAPS capsule, Take 1 capsule by mouth daily., Disp: , Rfl:  .  ranitidine (ZANTAC) 150 MG capsule, Take 1 capsule (150 mg total) by mouth daily as needed., Disp: 90 capsule, Rfl: 1 .  timolol (TIMOPTIC) 0.5 % ophthalmic solution, Frequency:QD   Dosage:0.0     Instructions:  Note:Dose: 0.5 %, OS, Disp: , Rfl:  .  vitamin B-12 (CYANOCOBALAMIN) 500 MCG tablet, Take 1 tablet by mouth daily., Disp: , Rfl:   Allergies  Allergen Reactions  . Tetracyclines & Related Other (See Comments)    UNSPECIFIED REACTION  other     ROS  Constitutional: Negative for fever or weight change.  Respiratory: Negative for cough and shortness of breath.   Cardiovascular: Negative for chest pain or palpitations.  Gastrointestinal: Negative for abdominal pain, no bowel changes.  Musculoskeletal: Negative for gait problem or joint swelling.  Skin: Negative for rash.  Neurological: Negative for dizziness or headache.  No other specific complaints in a complete review of systems (except as listed in HPI above).  Objective  Vitals:   01/22/18 0841  BP: 140/70  Pulse: 70  Resp: 14  SpO2: 96%  Weight: 177 lb 14.4 oz (80.7 kg)  Height: 5' 10"  (1.778 m)    Body  mass index is 25.53 kg/m.  Physical Exam  Constitutional: Patient appears well-developed and well-nourished. No distress.  HEENT: head atraumatic, normocephalic, pupils equal and reactive to light,neck supple, throat within normal limits Cardiovascular: Normal rate, regular rhythm and normal heart sounds.  No murmur heard. No BLE edema. Pulmonary/Chest: Effort normal and breath sounds normal. No respiratory distress. Abdominal: Soft.  There is no tenderness. Psychiatric: Patient has a normal mood and affect. behavior is normal. Judgment and thought content normal.  Recent Results (from the past 2160 hour(s))  COMPLETE METABOLIC PANEL WITH GFR     Status: Abnormal   Collection Time: 12/21/17 10:26 AM  Result Value Ref Range   Glucose, Bld 286 (H) 65 - 139 mg/dL    Comment: .        Non-fasting reference interval .    BUN 19 7 - 25 mg/dL   Creat 1.03 0.70 - 1.11 mg/dL    Comment: For patients >47 years of age, the reference limit for Creatinine is approximately 13% higher for people identified as African-American. .    GFR, Est Non African American 66 > OR = 60 mL/min/1.68m   GFR, Est African American 76 > OR = 60 mL/min/1.734m  BUN/Creatinine Ratio NOT APPLICABLE 6 - 22 (calc)   Sodium 137 135 - 146 mmol/L   Potassium 4.5 3.5 - 5.3 mmol/L   Chloride 103 98 - 110 mmol/L   CO2 28 20 - 32 mmol/L   Calcium 9.3 8.6 - 10.3 mg/dL   Total Protein 6.4 6.1 - 8.1 g/dL   Albumin 4.2 3.6 - 5.1 g/dL   Globulin 2.2 1.9 - 3.7 g/dL (calc)   AG Ratio 1.9 1.0 - 2.5 (calc)   Total Bilirubin 0.5 0.2 - 1.2 mg/dL   Alkaline phosphatase (APISO) 46 40 - 115 U/L   AST 27 10 - 35  U/L   ALT 39 9 - 46 U/L  HgB A1c     Status: Abnormal   Collection Time: 12/21/17 10:26 AM  Result Value Ref Range   Hgb A1c MFr Bld 7.5 (H) <5.7 % of total Hgb    Comment: For someone without known diabetes, a hemoglobin A1c value of 6.5% or greater indicates that they may have  diabetes and this should be confirmed  with a follow-up  test. . For someone with known diabetes, a value <7% indicates  that their diabetes is well controlled and a value  greater than or equal to 7% indicates suboptimal  control. A1c targets should be individualized based on  duration of diabetes, age, comorbid conditions, and  other considerations. . Currently, no consensus exists regarding use of hemoglobin A1c for diagnosis of diabetes for children. .    Mean Plasma Glucose 169 (calc)   eAG (mmol/L) 9.3 (calc)  CBC with Differential/Platelet     Status: Abnormal   Collection Time: 12/21/17 10:26 AM  Result Value Ref Range   WBC 5.0 3.8 - 10.8 Thousand/uL   RBC 4.08 (L) 4.20 - 5.80 Million/uL   Hemoglobin 13.5 13.2 - 17.1 g/dL   HCT 38.0 (L) 38.5 - 50.0 %   MCV 93.1 80.0 - 100.0 fL   MCH 33.1 (H) 27.0 - 33.0 pg   MCHC 35.5 32.0 - 36.0 g/dL   RDW 13.6 11.0 - 15.0 %   Platelets 134 (L) 140 - 400 Thousand/uL   MPV 11.5 7.5 - 12.5 fL   Neutro Abs 2,415 1,500 - 7,800 cells/uL   Lymphs Abs 1,740 850 - 3,900 cells/uL   WBC mixed population 515 200 - 950 cells/uL   Eosinophils Absolute 260 15 - 500 cells/uL   Basophils Absolute 70 0 - 200 cells/uL   Neutrophils Relative % 48.3 %   Total Lymphocyte 34.8 %   Monocytes Relative 10.3 %   Eosinophils Relative 5.2 %   Basophils Relative 1.4 %      PHQ2/9: Depression screen [facility] 2/9 11/07/2017 11/07/2017 10/19/2017 10/06/2017 08/07/2017  Decreased Interest 0 0 0 0 0  Down, Depressed, Hopeless 0 0 0 0 0  PHQ - 2 Score 0 0 0 0 0  Altered sleeping 0 - - - -  Tired, decreased energy 0 - - - -  Change in appetite 0 - - - -  Feeling bad or failure about yourself  0 - - - -  Trouble concentrating 0 - - - -  Moving slowly or fidgety/restless 0 - - - -  Suicidal thoughts 0 - - - -  PHQ-9 Score 0 - - - -     Fall Risk: Fall Risk  01/22/2018 11/07/2017 10/19/2017 10/06/2017 08/07/2017  Falls in the past year? No No No No No  Risk for fall due to : - Medication side  effect;Impaired vision - - -  Risk for fall due to: Comment - wears eyeglasses - - -      Functional Status Survey: Is the patient deaf or have difficulty hearing?: No Does the patient have difficulty seeing, even when wearing glasses/contacts?: No Does the patient have difficulty concentrating, remembering, or making decisions?: No Does the patient have difficulty walking or climbing stairs?: No Does the patient have difficulty dressing or bathing?: No Does the patient have difficulty doing errands alone such as visiting a doctor's office or shopping?: No   Assessment & Plan  1. Type 2 diabetes mellitus with diabetic nephropathy, with  long-term current use of insulin ([place])  We will not adjust medication, under the care of endocrinologist and continue follow up with her. Back on glipizide , lantus and metformin   2. Thrombocytopenia ([place])  Stable, recheck next visit, no easy bruising.   3. Dyslipidemia  - atorvastatin (LIPITOR) 10 MG tablet; TAKE 1 TABLET DAILY AT 6 PM.  Dispense: 90 tablet; Refill: 1  4. History of melanoma excision   5. Chronic gout of right foot, unspecified cause  - allopurinol (ZYLOPRIM) 100 MG tablet; Take 1 tablet (100 mg total) by mouth 2 (two) times daily.  Dispense: 180 tablet; Refill: 1  6. Hypertriglyceridemia  - atorvastatin (LIPITOR) 10 MG tablet; TAKE 1 TABLET DAILY AT 6 PM.  Dispense: 90 tablet; Refill: 1  7. Gastroesophageal reflux disease, esophagitis presence not specified   doing well, stop pantoprazole, continue ranitidine   8. History of MI (myocardial infarction)   9. History of lumbar laminectomy  Doing well at this time

## 2018-01-22 NOTE — Progress Notes (Signed)
Not seen

## 2018-01-30 DIAGNOSIS — M48062 Spinal stenosis, lumbar region with neurogenic claudication: Secondary | ICD-10-CM | POA: Diagnosis not present

## 2018-01-30 DIAGNOSIS — Z6825 Body mass index (BMI) 25.0-25.9, adult: Secondary | ICD-10-CM | POA: Diagnosis not present

## 2018-01-30 DIAGNOSIS — M5440 Lumbago with sciatica, unspecified side: Secondary | ICD-10-CM | POA: Diagnosis not present

## 2018-02-12 ENCOUNTER — Other Ambulatory Visit: Payer: Self-pay

## 2018-02-12 DIAGNOSIS — K219 Gastro-esophageal reflux disease without esophagitis: Secondary | ICD-10-CM

## 2018-02-12 MED ORDER — RANITIDINE HCL 150 MG PO CAPS: 150.0000 mg | ORAL_CAPSULE | Freq: Every day | ORAL | 1 refills | Status: DC | PRN

## 2018-02-12 NOTE — Telephone Encounter (Signed)
Refill request for general medication. Ranitidine to [place].   Last office visit: 01/22/2018   Follow up on 07/26/2018

## 2018-02-23 ENCOUNTER — Other Ambulatory Visit: Payer: Self-pay | Admitting: Family Medicine

## 2018-03-16 ENCOUNTER — Other Ambulatory Visit: Payer: Self-pay

## 2018-03-16 MED ORDER — INSULIN PEN NEEDLE 31G X 8 MM MISC: 3 refills | Status: DC

## 2018-03-16 NOTE — Telephone Encounter (Signed)
Refill request for diabetic medication:   BD UF Pen Needles  Last office visit pertaining to diabetes: 01/22/2018  Lab Results  Component Value Date   HGBA1C 7.5 (H) 12/21/2017   Follow-ups on file. 07/26/2018

## 2018-03-19 ENCOUNTER — Other Ambulatory Visit: Payer: Self-pay

## 2018-03-19 MED ORDER — INSULIN PEN NEEDLE 31G X 8 MM MISC: 5 refills | Status: DC

## 2018-03-19 NOTE — Telephone Encounter (Signed)
Refill request was sent to Dr. Krichna Sowles for approval and submission.  

## 2018-03-26 ENCOUNTER — Ambulatory Visit: Payer: Medicare HMO | Admitting: Nurse Practitioner

## 2018-05-15 ENCOUNTER — Other Ambulatory Visit: Payer: Self-pay | Admitting: Family Medicine

## 2018-05-15 DIAGNOSIS — Z794 Long term (current) use of insulin: Principal | ICD-10-CM

## 2018-05-15 DIAGNOSIS — E119 Type 2 diabetes mellitus without complications: Secondary | ICD-10-CM

## 2018-07-05 ENCOUNTER — Other Ambulatory Visit: Payer: Self-pay | Admitting: Family Medicine

## 2018-07-05 DIAGNOSIS — N400 Enlarged prostate without lower urinary tract symptoms: Secondary | ICD-10-CM

## 2018-07-05 DIAGNOSIS — I10 Essential (primary) hypertension: Secondary | ICD-10-CM

## 2018-07-08 NOTE — Telephone Encounter (Signed)
Has he been taking both medications for a long time, combo can cause hypotension

## 2018-07-09 NOTE — Telephone Encounter (Signed)
Discussed with Dr. Ancil Boozer - he has been on this for quite some time - previously Rx'd by Dr. Manuella Ghazi. Due to his advanced age, we will discuss at follow up and likely change his regimen to avoid hypotension.  30-day supply is provided to get him to his follow up with Dr. Ancil Boozer in November.

## 2018-07-26 ENCOUNTER — Ambulatory Visit (INDEPENDENT_AMBULATORY_CARE_PROVIDER_SITE_OTHER): Payer: Medicare HMO | Admitting: Family Medicine

## 2018-07-26 ENCOUNTER — Encounter: Payer: Self-pay | Admitting: Family Medicine

## 2018-07-26 VITALS — BP 134/68 | HR 86 | Temp 97.6°F | Resp 16 | Ht 70.0 in | Wt 182.0 lb

## 2018-07-26 DIAGNOSIS — M1A0710 Idiopathic chronic gout, right ankle and foot, without tophus (tophi): Secondary | ICD-10-CM

## 2018-07-26 DIAGNOSIS — E119 Type 2 diabetes mellitus without complications: Secondary | ICD-10-CM

## 2018-07-26 DIAGNOSIS — E559 Vitamin D deficiency, unspecified: Secondary | ICD-10-CM

## 2018-07-26 DIAGNOSIS — Z23 Encounter for immunization: Secondary | ICD-10-CM

## 2018-07-26 DIAGNOSIS — I10 Essential (primary) hypertension: Secondary | ICD-10-CM

## 2018-07-26 DIAGNOSIS — Z794 Long term (current) use of insulin: Secondary | ICD-10-CM

## 2018-07-26 DIAGNOSIS — Z79899 Other long term (current) drug therapy: Secondary | ICD-10-CM

## 2018-07-26 DIAGNOSIS — Z9889 Other specified postprocedural states: Secondary | ICD-10-CM

## 2018-07-26 DIAGNOSIS — K219 Gastro-esophageal reflux disease without esophagitis: Secondary | ICD-10-CM

## 2018-07-26 DIAGNOSIS — N401 Enlarged prostate with lower urinary tract symptoms: Secondary | ICD-10-CM

## 2018-07-26 DIAGNOSIS — Z85820 Personal history of malignant melanoma of skin: Secondary | ICD-10-CM

## 2018-07-26 DIAGNOSIS — D696 Thrombocytopenia, unspecified: Secondary | ICD-10-CM

## 2018-07-26 DIAGNOSIS — E785 Hyperlipidemia, unspecified: Secondary | ICD-10-CM

## 2018-07-26 DIAGNOSIS — E781 Pure hyperglyceridemia: Secondary | ICD-10-CM

## 2018-07-26 DIAGNOSIS — I252 Old myocardial infarction: Secondary | ICD-10-CM

## 2018-07-26 DIAGNOSIS — R351 Nocturia: Secondary | ICD-10-CM

## 2018-07-26 MED ORDER — INSULIN GLARGINE 100 UNIT/ML SOLOSTAR PEN: 48.0000 [IU] | PEN_INJECTOR | Freq: Every day | SUBCUTANEOUS | 1 refills | Status: DC

## 2018-07-26 MED ORDER — ALLOPURINOL 100 MG PO TABS: 100.0000 mg | ORAL_TABLET | Freq: Two times a day (BID) | ORAL | 1 refills | Status: DC

## 2018-07-26 MED ORDER — DOXAZOSIN MESYLATE 4 MG PO TABS: 4.0000 mg | ORAL_TABLET | Freq: Every day | ORAL | 1 refills | Status: DC

## 2018-07-26 MED ORDER — FAMOTIDINE 20 MG PO TABS: 20.0000 mg | ORAL_TABLET | Freq: Every day | ORAL | 1 refills | Status: DC | PRN

## 2018-07-26 MED ORDER — METFORMIN HCL 1000 MG PO TABS: 1000.0000 mg | ORAL_TABLET | Freq: Two times a day (BID) | ORAL | 1 refills | Status: DC

## 2018-07-26 MED ORDER — GLIPIZIDE 10 MG PO TABS: 10.0000 mg | ORAL_TABLET | Freq: Two times a day (BID) | ORAL | 1 refills | Status: DC

## 2018-07-26 MED ORDER — AMLODIPINE BESYLATE 5 MG PO TABS: 10.0000 mg | ORAL_TABLET | Freq: Every day | ORAL | 1 refills | Status: DC

## 2018-07-26 MED ORDER — FINASTERIDE 5 MG PO TABS: 5.0000 mg | ORAL_TABLET | Freq: Every day | ORAL | 1 refills | Status: DC

## 2018-07-26 MED ORDER — ATORVASTATIN CALCIUM 10 MG PO TABS: ORAL_TABLET | ORAL | 1 refills | Status: DC

## 2018-07-26 MED ORDER — LISINOPRIL 40 MG PO TABS: 40.0000 mg | ORAL_TABLET | Freq: Every day | ORAL | 1 refills | Status: DC

## 2018-07-26 NOTE — Patient Instructions (Signed)
Adjust dose of lantus by going down by 2 units every 3 days to keep morning sugar between 100-130.  Start at 48 units

## 2018-07-26 NOTE — Progress Notes (Signed)
Name: Tracy [name]   MRN: [id number]    DOB: 25-Oct-1929   Date:07/26/2018       Progress Note  Subjective  Chief Complaint  Chief Complaint  Patient presents with  . Medication Refill    6 month F/U- Would like a referral to dietitian for his DM  . Diabetes    he sees Dr. Kenton Kingfisher at [facility]  . Hypertension  . Hyperlipidemia  . 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 He has been doing well since, denies chest pain, SOB or orthopnea. He denies side effects of medication   DMII: he sees Dr. Kenton Kingfisher at [facility]. He had glipizide decreased by NP, but glucose went up and he went back to Endocrinologist and is back on Glipizide , lantus and metformin and glucose is back to goal. Denies polyphagia, polyuria or polyphagia. Urine micro is up to date. Glucose has dropped a few times recently in a span a couple of weeks. It went to 65-70 and 80. Advised him to titrate down the dose of Lantus. He would like to go to diabetic teaching class again.    Hyperlipidemia: taking medication and denies side effects, LDL is at goal. No myalgia. We will recheck labs today   GERD: symptoms severe after back surgery , took pantoprazole for a period of time, currently taking Ranitidine prn and causes some diarrhea, but would like to change because of recall, we will switch to pepcid   HTN: under control with medication, no chest pain or decrease in exercise tolerance. Still golfing twice a week.   Thrombocytopenia: stable  History of lumbar surgery : doing well   Patient Active Problem List   Diagnosis Date Noted  . Spinal stenosis of lumbar region 09/13/2017  . Spinal stenosis of lumbar region with neurogenic claudication 05/03/2017  . Type 2 diabetes mellitus without complication, with long-term current use of insulin ([place]) 05/03/2017  . Hyperkalemia 04/18/2017  . Left hip pain 07/28/2016  . ALT (SGPT) level  raised 07/13/2015  . At risk for falling 07/13/2015  . Arthritis of hand, degenerative 07/13/2015  . Gout 07/13/2015  . Glaucoma 07/13/2015  . Diabetic neuropathy ([place]) 07/13/2015  . Hypertension 04/09/2015  . Dyslipidemia 04/09/2015  . Hypertriglyceridemia 04/09/2015  . Benign fibroma of prostate 04/09/2015  . Acid reflux 04/09/2015  . History of melanoma excision 04/09/2015  . History of squamous cell carcinoma 05/23/2013  . H/O adenomatous polyp of colon 08/25/2010  . History of MI (myocardial infarction) 08/27/1979    Past Surgical History:  Procedure Laterality Date  . CATARACT EXTRACTION W/ INTRAOCULAR LENS  IMPLANT, BILATERAL    . CIRCUMCISION    . GANGLION CYST EXCISION     LEFT HAND  . LUMBAR LAMINECTOMY/DECOMPRESSION MICRODISCECTOMY  09/13/2017   Procedure: BILATERAL Lumbar three-four , Left  Lumbar four-five  AND Left  L5-S1, LAMINECTOMY/FORAMINOTOMY;  Surgeon: [name], MD;  Location: [place];  Service: Neurosurgery;;  . MELANOMA EXCISION  07/2011  . TONSILLECTOMY    . VASECTOMY      Family History  Problem Relation Age of Onset  . Heart disease Mother   . Diabetes Mother        type 2  . Angina Mother   . Heart disease Brother   . Pneumonia Father   . Heart attack Father     Social History   Socioeconomic History  . Marital status:  Married    Spouse name: [name]  . Number of children: 4  . Years of education: Not on file  . Highest education level: Bachelor's degree (e.g., BA, AB, BS)  Occupational History  . Occupation: Retired  Scientific laboratory technician  . Financial resource strain: Not hard at all  . Food insecurity:    Worry: Never true    Inability: Never true  . Transportation needs:    Medical: No    Non-medical: No  Tobacco Use  . Smoking status: Former Smoker    Packs/day: 2.00    Years: 13.00    Pack years: 26.00    Types: Cigarettes    Last attempt to quit: 1963    Years since quitting: 56.9  . Smokeless tobacco: Never Used  . Tobacco  comment: smoking cessation materials not required  Substance and Sexual Activity  . Alcohol use: Yes    Alcohol/week: 2.0 standard drinks    Types: 2 Shots of liquor per week    Comment: occasional  . Drug use: No  . Sexual activity: Not Currently  Lifestyle  . Physical activity:    Days per week: 5 days    Minutes per session: 60 min  . Stress: Not at all  Relationships  . Social connections:    Talks on phone: Patient refused    Gets together: Patient refused    Attends religious service: Patient refused    Active member of club or organization: Patient refused    Attends meetings of clubs or organizations: Patient refused    Relationship status: Married  . Intimate partner violence:    Fear of current or ex partner: No    Emotionally abused: No    Physically abused: No    Forced sexual activity: No  Other Topics Concern  . Not on file  Social History Narrative  . Not on file     Current Outpatient Medications:  .  ACCU-CHEK FASTCLIX LANCETS MISC, CHECK  BLOOD  SUGAR TWICE DAILY, Disp: 204 each, Rfl: 1 .  allopurinol (ZYLOPRIM) 100 MG tablet, Take 1 tablet (100 mg total) by mouth 2 (two) times daily., Disp: 180 tablet, Rfl: 1 .  amLODipine (NORVASC) 5 MG tablet, Take 2 tablets (10 mg total) by mouth daily., Disp: 90 tablet, Rfl: 1 .  aspirin 81 MG tablet, Take 81 mg by mouth., Disp: , Rfl:  .  atorvastatin (LIPITOR) 10 MG tablet, TAKE 1 TABLET DAILY AT 6 PM., Disp: 90 tablet, Rfl: 1 .  Blood Glucose Monitoring Suppl (ACCU-CHEK NANO SMARTVIEW) W/DEVICE KIT, USE AS DIRECTED, Disp: 1 kit, Rfl: 0 .  Cholecalciferol (VITAMIN D) 2000 units tablet, Take 2,000 Units by mouth daily., Disp: , Rfl:  .  dorzolamide (TRUSOPT) 2 % ophthalmic solution, Place 1 drop into the left eye 2 (two) times daily. , Disp: , Rfl:  .  doxazosin (CARDURA) 4 MG tablet, Take 1 tablet (4 mg total) by mouth at bedtime., Disp: 90 tablet, Rfl: 1 .  finasteride (PROSCAR) 5 MG tablet, Take 1 tablet (5 mg  total) by mouth at bedtime., Disp: 90 tablet, Rfl: 1 .  glipiZIDE (GLUCOTROL) 10 MG tablet, Take 1 tablet (10 mg total) by mouth 2 (two) times daily before a meal., Disp: 180 tablet, Rfl: 1 .  glucose blood (COOL BLOOD GLUCOSE TEST STRIPS) test strip, Use as instructed, Disp: 100 each, Rfl: 12 .  Insulin Glargine (LANTUS SOLOSTAR) 100 UNIT/ML Solostar Pen, Inject 48 Units into the skin at bedtime., Disp: 15 mL,  Rfl: 1 .  Insulin Pen Needle (B-D ULTRAFINE III SHORT PEN) 31G X 8 MM MISC, USE AS DIRECTED WITH INSULIN PEN., Disp: 100 each, Rfl: 5 .  lisinopril (PRINIVIL,ZESTRIL) 40 MG tablet, Take 1 tablet (40 mg total) by mouth daily., Disp: 90 tablet, Rfl: 1 .  metFORMIN (GLUCOPHAGE) 1000 MG tablet, Take 1 tablet (1,000 mg total) by mouth 2 (two) times daily with a meal., Disp: 180 tablet, Rfl: 1 .  Multiple Vitamins-Minerals (CENTRUM SILVER 50+MEN) TABS, Take 1 tablet by mouth daily., Disp: , Rfl:  .  NEEDLE, DISP, 14 G 14G X 1" MISC, , Disp: , Rfl:  .  omega-3 fish oil (MAXEPA) 1000 MG CAPS capsule, Take 1 capsule by mouth daily., Disp: , Rfl:  .  timolol (TIMOPTIC) 0.5 % ophthalmic solution, Place 1 drop into the left eye 2 (two) times daily. , Disp: , Rfl:  .  vitamin B-12 (CYANOCOBALAMIN) 500 MCG tablet, Take 1 tablet by mouth daily., Disp: , Rfl:  .  famotidine (PEPCID) 20 MG tablet, Take 1 tablet (20 mg total) by mouth daily as needed for heartburn or indigestion. In place of Ranitidine, Disp: 90 tablet, Rfl: 1  Allergies  Allergen Reactions  . Tetracyclines & Related Other (See Comments)    UNSPECIFIED REACTION  other    I personally reviewed active problem list, medication list, allergies, family history, social history with the patient/caregiver today.   ROS  Constitutional: Negative for fever or weight change.  Respiratory: Negative for cough and shortness of breath.   Cardiovascular: Negative for chest pain or palpitations.  Gastrointestinal: Negative for abdominal pain, no  bowel changes.  Musculoskeletal: Negative for gait problem or joint swelling.  Skin: Negative for rash.  Neurological: Negative for dizziness or headache.  No other specific complaints in a complete review of systems (except as listed in HPI above).  Objective  Vitals:   07/26/18 0903  BP: 134/68  Pulse: 86  Resp: 16  Temp: 97.6 F (36.4 C)  TempSrc: Oral  SpO2: 98%  Weight: 182 lb (82.6 kg)  Height: _0  (1.778 m)    Body mass index is 26.11 kg/m.  Physical Exam  Constitutional: Patient appears well-developed and well-nourished.  No distress.  HEENT: head atraumatic, normocephalic, pupils equal and reactive to light,  neck supple, throat within normal limits Cardiovascular: Normal rate, regular rhythm and normal heart sounds.  No murmur heard. No BLE edema. Pulmonary/Chest: Effort normal and breath sounds normal. No respiratory distress. Abdominal: Soft.  There is no tenderness. Psychiatric: Patient has a normal mood and affect. behavior is normal. Judgment and thought content normal.   Diabetic Foot Exam: Diabetic Foot Exam - Simple   Simple Foot Form Diabetic Foot exam was performed with the following findings:  Yes 07/26/2018  9:57 AM  Visual Inspection See comments:  Yes Sensation Testing Intact to touch and monofilament testing bilaterally:  Yes Pulse Check Posterior Tibialis and Dorsalis pulse intact bilaterally:  Yes Comments Some callus formation      PHQ2/9: Depression screen [facility] 2/9 07/26/2018 11/07/2017 11/07/2017 10/19/2017 10/06/2017  Decreased Interest 0 0 0 0 0  Down, Depressed, Hopeless 0 0 0 0 0  PHQ - 2 Score 0 0 0 0 0  Altered sleeping 0 0 - - -  Tired, decreased energy 1 0 - - -  Change in appetite 0 0 - - -  Feeling bad or failure about yourself  0 0 - - -  Trouble concentrating 1 0 - - -  Moving slowly or fidgety/restless 1 0 - - -  Suicidal thoughts 0 0 - - -  PHQ-9 Score 3 0 - - -  Difficult doing work/chores Not difficult at all - -  - -     Fall Risk: Fall Risk  07/26/2018 01/22/2018 11/07/2017 10/19/2017 10/06/2017  Falls in the past year? 0 No No No No  Number falls in past yr: 0 - - - -  Injury with Fall? 0 - - - -  Risk for fall due to : - - Medication side effect;Impaired vision - -  Risk for fall due to: Comment - - wears eyeglasses - -     Functional Status Survey: Is the patient deaf or have difficulty hearing?: No Does the patient have difficulty seeing, even when wearing glasses/contacts?: Yes(glasses) Does the patient have difficulty concentrating, remembering, or making decisions?: No Does the patient have difficulty walking or climbing stairs?: No Does the patient have difficulty dressing or bathing?: No Does the patient have difficulty doing errands alone such as visiting a doctor's office or shopping?: No   Assessment & Plan  1. Essential hypertension  - amLODipine (NORVASC) 5 MG tablet; Take 2 tablets (10 mg total) by mouth daily.  Dispense: 90 tablet; Refill: 1 - doxazosin (CARDURA) 4 MG tablet; Take 1 tablet (4 mg total) by mouth at bedtime.  Dispense: 90 tablet; Refill: 1 - lisinopril (PRINIVIL,ZESTRIL) 40 MG tablet; Take 1 tablet (40 mg total) by mouth daily.  Dispense: 90 tablet; Refill: 1 - COMPLETE METABOLIC PANEL WITH GFR - CBC with Differential/Platelet  2. Need for immunization against influenza  - Flu vaccine HIGH DOSE PF (Fluzone High dose)  3. Chronic gout of right foot, unspecified cause  - allopurinol (ZYLOPRIM) 100 MG tablet; Take 1 tablet (100 mg total) by mouth 2 (two) times daily.  Dispense: 180 tablet; Refill: 1  4. BPH associated with nocturia  - doxazosin (CARDURA) 4 MG tablet; Take 1 tablet (4 mg total) by mouth at bedtime.  Dispense: 90 tablet; Refill: 1 - finasteride (PROSCAR) 5 MG tablet; Take 1 tablet (5 mg total) by mouth at bedtime.  Dispense: 90 tablet; Refill: 1  5. Type 2 diabetes mellitus without complication, with long-term current use of insulin  (HCC)  - metFORMIN (GLUCOPHAGE) 1000 MG tablet; Take 1 tablet (1,000 mg total) by mouth 2 (two) times daily with a meal.  Dispense: 180 tablet; Refill: 1 - Insulin Glargine (LANTUS SOLOSTAR) 100 UNIT/ML Solostar Pen; Inject 48 Units into the skin at bedtime.  Dispense: 15 mL; Refill: 1 - Hemoglobin A1c  6. Dyslipidemia  - atorvastatin (LIPITOR) 10 MG tablet; TAKE 1 TABLET DAILY AT 6 PM.  Dispense: 90 tablet; Refill: 1 - Lipid panel  7. History of melanoma excision  Seeing dermatologist  8. Gastroesophageal reflux disease, esophagitis presence not specified  - famotidine (PEPCID) 20 MG tablet; Take 1 tablet (20 mg total) by mouth daily as needed for heartburn or indigestion. In place of Ranitidine  Dispense: 90 tablet; Refill: 1  9. History of MI (myocardial infarction)  No pain at this time  10. Thrombocytopenia (HCC)  Stable  11. Hypertriglyceridemia  - atorvastatin (LIPITOR) 10 MG tablet; TAKE 1 TABLET DAILY AT 6 PM.  Dispense: 90 tablet; Refill: 1  12. History of lumbar laminectomy  Doing well   13. Long-term use of high-risk medication  - Vitamin B12  14. Vitamin D deficiency  - VITAMIN D 25 Hydroxy (Vit-D Deficiency, Fractures)

## 2018-07-27 LAB — COMPLETE METABOLIC PANEL WITH GFR
AG Ratio: 1.9 (calc) (ref 1.0–2.5)
ALKALINE PHOSPHATASE (APISO): 47 U/L (ref 40–115)
ALT: 30 U/L (ref 9–46)
AST: 22 U/L (ref 10–35)
Albumin: 4.4 g/dL (ref 3.6–5.1)
BUN: 20 mg/dL (ref 7–25)
CO2: 25 mmol/L (ref 20–32)
Calcium: 9.4 mg/dL (ref 8.6–10.3)
Chloride: 105 mmol/L (ref 98–110)
Creat: 1.09 mg/dL (ref 0.70–1.11)
GFR, Est African American: 71 mL/min/{1.73_m2} (ref >=60)
GFR, Est Non African American: 62 mL/min/{1.73_m2} (ref >=60)
Globulin: 2.3 g/dL (calc) (ref 1.9–3.7)
Glucose, Bld: 176 mg/dL — ABNORMAL HIGH (ref 65–139)
Potassium: 4.6 mmol/L (ref 3.5–5.3)
Sodium: 137 mmol/L (ref 135–146)
TOTAL PROTEIN: 6.7 g/dL (ref 6.1–8.1)
Total Bilirubin: 0.5 mg/dL (ref 0.2–1.2)

## 2018-07-27 LAB — HEMOGLOBIN A1C
HEMOGLOBIN A1C: 6.9 %{Hb} — AB (ref <5.7)
Mean Plasma Glucose: 151 (calc)
eAG (mmol/L): 8.4 (calc)

## 2018-07-27 LAB — CBC WITH DIFFERENTIAL/PLATELET
BASOS PCT: 1.2 %
Basophils Absolute: 82 cells/uL (ref 0–200)
EOS ABS: 537 {cells}/uL — AB (ref 15–500)
Eosinophils Relative: 7.9 %
HCT: 39.3 % (ref 38.5–50.0)
Hemoglobin: 13.6 g/dL (ref 13.2–17.1)
Lymphs Abs: 2128 cells/uL (ref 850–3900)
MCH: 32.9 pg (ref 27.0–33.0)
MCHC: 34.6 g/dL (ref 32.0–36.0)
MCV: 94.9 fL (ref 80.0–100.0)
MPV: 11.7 fL (ref 7.5–12.5)
Monocytes Relative: 9.4 %
Neutro Abs: 3414 cells/uL (ref 1500–7800)
Neutrophils Relative %: 50.2 %
PLATELETS: 157 10*3/uL (ref 140–400)
RBC: 4.14 10*6/uL — ABNORMAL LOW (ref 4.20–5.80)
RDW: 13.1 % (ref 11.0–15.0)
Total Lymphocyte: 31.3 %
WBC: 6.8 10*3/uL (ref 3.8–10.8)
WBCMIX: 639 {cells}/uL (ref 200–950)

## 2018-07-27 LAB — VITAMIN D 25 HYDROXY (VIT D DEFICIENCY, FRACTURES): VIT D 25 HYDROXY: 33 ng/mL (ref 30–100)

## 2018-07-27 LAB — LIPID PANEL
Cholesterol: 104 mg/dL (ref <200)
HDL: 46 mg/dL (ref >40)
LDL Cholesterol (Calc): 27 mg/dL (calc)
NON-HDL CHOLESTEROL (CALC): 58 mg/dL (ref <130)
Total CHOL/HDL Ratio: 2.3 (calc) (ref <5.0)
Triglycerides: 262 mg/dL — ABNORMAL HIGH (ref <150)

## 2018-07-27 LAB — VITAMIN B12: VITAMIN B 12: 456 pg/mL (ref 200–1100)

## 2018-08-13 ENCOUNTER — Encounter: Payer: Medicare HMO | Attending: Family Medicine | Admitting: Dietician

## 2018-08-13 ENCOUNTER — Encounter: Payer: Self-pay | Admitting: Dietician

## 2018-08-13 DIAGNOSIS — E119 Type 2 diabetes mellitus without complications: Secondary | ICD-10-CM | POA: Diagnosis not present

## 2018-08-13 DIAGNOSIS — Z794 Long term (current) use of insulin: Secondary | ICD-10-CM | POA: Insufficient documentation

## 2018-08-13 DIAGNOSIS — Z713 Dietary counseling and surveillance: Secondary | ICD-10-CM | POA: Insufficient documentation

## 2018-08-13 NOTE — Progress Notes (Signed)
Medical Nutrition Therapy: Visit start time: 1320 end time: 1425  Assessment:  Diagnosis: Type 2 Diabetes Past medical history: MI at age 84; elevated triglycerides, hypertension Psychosocial issues/ stress concerns:  Patient rates his stress as "high" but indicates "ok" as to how well he is dealing with his stress.  Preferred learning method:  . Hands-on  Current weight: 185 lbs (with shoes) Height: 70 in  Medications, supplements:  See list  Progress and evaluation:  Patient accompanied by his wife in for initial medical nutrition therapy appointment. He reports he was diagnosed with Type 2 diabetes 20 years ago and he initiated this visit because he wants a review on diet particularly carbohydrate sources and recommended grams. His most recent HgA1c was 6.9; down from 7.9. He has had several incidents of hypoglycemia and he states his doctor wants his A1c to be 7 or slightly above. He gives a goal weight of 180 lbs and weight today is very close to his goal when consider clothes and shoe weight. He has kept his weight relatively stable for years. He typically eats 3 meals.   He states that rice and pasta portions are sometimes too large and elevate his glucose. In general, he makes healthy food choices.Rarely eats sweets; limits fried foods and includes fruits/vegetables several times daily. Physical activity: golf 1-2 days per week; [place] 1-2 days per week  Dietary Intake:  Usual eating pattern includes 3 meals and 2 snacks per day. Dining out frequency: 2 meals per week.  Breakfast: 7:00am- cereal/toast with bacon or sausage or English muffin with egg or  Snack: fruit Lunch: leftovers from dinner or homemade soup Snack:fruit Supper: 7:00pm meat, starch, vegetables Snack: typically no snack Beverages: coffee, 1-2 cups water, occasional unsweetened tea or very occasional diet soda; no sugar sweetened beverages. Drinks 3 oz of liquor daily. Nutrition Care  Education:  Diabetes:  goals for BGs, appropriate meal and snack schedule,   Instructed on a meal plan including carbohydrate counting and how to balance carbohydrate, protein and non-starchy vegetables. Stressed need for protein at breakfast to decrease risk of low blood sugar. Also, encouraged to eat 1 serving of carbohydrate and 1 oz protein for evening snack as he has experienced low blood sugars during the night. Reviewed prevention and treatment of low blood sugars. Elevated triglycerides: Discussed how patient is following basic guidelines for lowering triglycerides - low fat diet, exercise, limiting concentrated sweets. Encouraged to limit alcohol also as part of guidelines. Nutritional Diagnosis:  NB-1.1 Food and nutrition-related knowledge deficit As related to understanding grams of carbohdyrate and reading food labels.  As evidenced by statement that patient made "I want to have a better understanding of grams of carbohydrates and recommended amounts" as part of diet history..  Intervention:  Balance meals with protein, 2-4 servings of carbohydrate (30-60gms) and non-starchy vegetables. Spread 13 servings of carbohydrate over 3 meals and 2-3 small snacks. Always keep fast acting sugar with you in case of a low blood sugar. Include a small bedtime snack that consists of one serving of carbohydrate and 1 oz of protein. Read labels for gms of carbohydrate. Subtract fiber.  Education Materials given:  . Plate Planner . Food lists/ Planning A Balanced Meal . Sample meal pattern/ menus . Goals/ instructions Learner/ who was taught:  . Patient  . Spouse/ partner Level of understanding: Marland Kitchen Verbalized understanding  Demonstrated degree of understanding via:   Teach back Learning barriers: . None Willingness to learn/ readiness for change: . Eager, change  in progress  Monitoring and Evaluation:   No follow-up was scheduled. Patient was encouraged to call if has further questions or  desires further help with his diet/meal  plan.  Called patient after the visit to encourage more water as his overall fluid intake is low.

## 2018-08-13 NOTE — Patient Instructions (Signed)
Balance meals with protein, 2-4 servings of carbohydrate (30-60gms) and non-starchy vegetables. Spread 13 servings of carbohydrate over 3 meals and 2-3 small snacks. Always keep fast acting sugar with you in case of a low blood sugar. Include a small bedtime snack that consists of one serving of carbohydrate and 1 oz of protein. Read labels for gms of carbohydrate. Subtract fiber.

## 2018-08-24 ENCOUNTER — Ambulatory Visit (INDEPENDENT_AMBULATORY_CARE_PROVIDER_SITE_OTHER): Payer: Medicare HMO | Admitting: Nurse Practitioner

## 2018-08-24 ENCOUNTER — Encounter: Payer: Self-pay | Admitting: Nurse Practitioner

## 2018-08-24 VITALS — BP 138/62 | HR 96 | Temp 97.8°F | Resp 16 | Ht 70.0 in | Wt 181.9 lb

## 2018-08-24 DIAGNOSIS — H6992 Unspecified Eustachian tube disorder, left ear: Secondary | ICD-10-CM

## 2018-08-24 MED ORDER — LEVOCETIRIZINE DIHYDROCHLORIDE 5 MG PO TABS: 5.0000 mg | ORAL_TABLET | Freq: Every evening | ORAL | 0 refills | Status: DC

## 2018-08-24 MED ORDER — FLUTICASONE PROPIONATE 50 MCG/ACT NA SUSP: 1.0000 | Freq: Every day | NASAL | 2 refills | Status: DC

## 2018-08-24 NOTE — Progress Notes (Signed)
Name: Tracy [name]   MRN: [id number]    DOB: 04-18-1930   Date:08/24/2018       Progress Note  Subjective  Chief Complaint  Chief Complaint  Patient presents with  . Ear Fullness    patient just finished a 7- day supply of Cefdinir 368m     HPI  Patient has had left ear pain 2 weeks ago was rx cefinir for ear infections- pain resolved and he has completed the course. He does note he is still having ear fullness and muffled sounds. When he lays down can sometimes hear heart beat. Denies tinnitus or pain, dizziness, lightheadedness, visual changes, headaches.  Patient Active Problem List   Diagnosis Date Noted  . Spinal stenosis of lumbar region 09/13/2017  . Spinal stenosis of lumbar region with neurogenic claudication 05/03/2017  . Type 2 diabetes mellitus without complication, with long-term current use of insulin ([place] 05/03/2017  . Hyperkalemia 04/18/2017  . Left hip pain 07/28/2016  . ALT (SGPT) level raised 07/13/2015  . At risk for falling 07/13/2015  . Arthritis of hand, degenerative 07/13/2015  . Gout 07/13/2015  . Glaucoma 07/13/2015  . Diabetic neuropathy ([facility] 07/13/2015  . Hypertension 04/09/2015  . Dyslipidemia 04/09/2015  . Hypertriglyceridemia 04/09/2015  . Benign fibroma of prostate 04/09/2015  . Acid reflux 04/09/2015  . History of melanoma excision 04/09/2015  . History of squamous cell carcinoma 05/23/2013  . H/O adenomatous polyp of colon 08/25/2010  . History of MI (myocardial infarction) 08/27/1979    Past Medical History:  Diagnosis Date  . Arthritis   . Cancer ([facility]    SKIN CANCER  . Diabetes mellitus without complication ([place]   . GERD (gastroesophageal reflux disease)   . Hypertension   . Myocardial infarction ([facility]    08/27/1979    Past Surgical History:  Procedure Laterality Date  . CATARACT EXTRACTION W/ INTRAOCULAR LENS  IMPLANT, BILATERAL    . CIRCUMCISION    . GANGLION CYST EXCISION     LEFT HAND  . LUMBAR  LAMINECTOMY/DECOMPRESSION MICRODISCECTOMY  09/13/2017   Procedure: BILATERAL Lumbar three-four , Left  Lumbar four-five  AND Left  L5-S1, LAMINECTOMY/FORAMINOTOMY;  Surgeon: CKary Kos MD;  [place]  Service: Neurosurgery;;  . MELANOMA EXCISION  07/2011  . TONSILLECTOMY    . VASECTOMY      Social History   Tobacco Use  . Smoking status: Former Smoker    Packs/day: 2.00    Years: 13.00    Pack years: 26.00    Types: Cigarettes    Last attempt to quit: 1963    Years since quitting: 56.9  . Smokeless tobacco: Never Used  . Tobacco comment: smoking cessation materials not required  Substance Use Topics  . Alcohol use: Yes    Alcohol/week: 2.0 standard drinks    Types: 2 Shots of liquor per week    Comment: occasional     Current Outpatient Medications:  .  ACCU-CHEK FASTCLIX LANCETS MISC, CHECK  BLOOD  SUGAR TWICE DAILY, Disp: 204 each, Rfl: 1 .  allopurinol (ZYLOPRIM) 100 MG tablet, Take 1 tablet (100 mg total) by mouth 2 (two) times daily., Disp: 180 tablet, Rfl: 1 .  amLODipine (NORVASC) 5 MG tablet, Take 2 tablets (10 mg total) by mouth daily., Disp: 90 tablet, Rfl: 1 .  aspirin 81 MG tablet, Take 81 mg by mouth., Disp: , Rfl:  .  atorvastatin (LIPITOR) 10 MG tablet, TAKE 1 TABLET DAILY AT 6 PM., Disp: 90 tablet, Rfl: 1 .  Blood Glucose Monitoring Suppl (ACCU-CHEK NANO SMARTVIEW) W/DEVICE KIT, USE AS DIRECTED, Disp: 1 kit, Rfl: 0 .  Cholecalciferol (VITAMIN D) 2000 units tablet, Take 2,000 Units by mouth daily., Disp: , Rfl:  .  dorzolamide (TRUSOPT) 2 % ophthalmic solution, Place 1 drop into the left eye 2 (two) times daily. , Disp: , Rfl:  .  doxazosin (CARDURA) 4 MG tablet, Take 1 tablet (4 mg total) by mouth at bedtime., Disp: 90 tablet, Rfl: 1 .  famotidine (PEPCID) 20 MG tablet, Take 1 tablet (20 mg total) by mouth daily as needed for heartburn or indigestion. In place of Ranitidine, Disp: 90 tablet, Rfl: 1 .  finasteride (PROSCAR) 5 MG tablet, Take 1 tablet (5 mg  total) by mouth at bedtime., Disp: 90 tablet, Rfl: 1 .  glipiZIDE (GLUCOTROL) 10 MG tablet, Take 1 tablet (10 mg total) by mouth 2 (two) times daily before a meal., Disp: 180 tablet, Rfl: 1 .  glucose blood (COOL BLOOD GLUCOSE TEST STRIPS) test strip, Use as instructed, Disp: 100 each, Rfl: 12 .  Insulin Glargine (LANTUS SOLOSTAR) 100 UNIT/ML Solostar Pen, Inject 48 Units into the skin at bedtime., Disp: 15 mL, Rfl: 1 .  Insulin Pen Needle (B-D ULTRAFINE III SHORT PEN) 31G X 8 MM MISC, USE AS DIRECTED WITH INSULIN PEN., Disp: 100 each, Rfl: 5 .  lisinopril (PRINIVIL,ZESTRIL) 40 MG tablet, Take 1 tablet (40 mg total) by mouth daily., Disp: 90 tablet, Rfl: 1 .  metFORMIN (GLUCOPHAGE) 1000 MG tablet, Take 1 tablet (1,000 mg total) by mouth 2 (two) times daily with a meal., Disp: 180 tablet, Rfl: 1 .  Multiple Vitamins-Minerals (CENTRUM SILVER 50+MEN) TABS, Take 1 tablet by mouth daily., Disp: , Rfl:  .  NEEDLE, DISP, 14 G 14G X 1" MISC, , Disp: , Rfl:  .  omega-3 fish oil (MAXEPA) 1000 MG CAPS capsule, Take 1 capsule by mouth daily., Disp: , Rfl:  .  timolol (TIMOPTIC) 0.5 % ophthalmic solution, Place 1 drop into the left eye 2 (two) times daily. , Disp: , Rfl:  .  vitamin B-12 (CYANOCOBALAMIN) 500 MCG tablet, Take 1 tablet by mouth daily., Disp: , Rfl:   Allergies  Allergen Reactions  . Tetracyclines & Related Other (See Comments)    UNSPECIFIED REACTION  other    ROS   No other specific complaints in a complete review of systems (except as listed in HPI above).  Objective  Vitals:   08/24/18 0938  BP: 138/62  Pulse: 96  Resp: 16  Temp: 97.8 F (36.6 C)  TempSrc: Oral  SpO2: 96%  Weight: 181 lb 14.4 oz (82.5 kg)  Height: 5' 10"  (1.778 m)    Body mass index is 26.1 kg/m.  Nursing Note and Vital Signs reviewed.  Physical Exam Constitutional:      Appearance: Normal appearance.  HENT:     Head: Normocephalic and atraumatic.     Right Ear: Tympanic membrane, ear canal and  external ear normal. There is no impacted cerumen.     Left Ear: No swelling or tenderness. Tympanic membrane is scarred. Tympanic membrane is not erythematous.     Nose: Congestion present.     Mouth/Throat:     Mouth: Mucous membranes are dry.     Pharynx: Oropharynx is clear. No oropharyngeal exudate or posterior oropharyngeal erythema.  Eyes:     Extraocular Movements: Extraocular movements intact.     Conjunctiva/sclera: Conjunctivae normal.     Pupils: Pupils are equal, round, and reactive  to light.  Cardiovascular:     Rate and Rhythm: Normal rate.  Pulmonary:     Effort: Pulmonary effort is normal.  Skin:    General: Skin is warm and dry.  Neurological:     Mental Status: He is alert. Mental status is at baseline.  Psychiatric:        Mood and Affect: Mood normal.        Thought Content: Thought content normal.       No results found for this or any previous visit (from the past 48 hour(s)).  Assessment & Plan 1. Eustachian tube disorder, left If symptoms do not improve or worsen discussed referral to ENT - fluticasone (FLONASE) 50 MCG/ACT nasal spray; Place 1 spray into both nostrils daily.  Dispense: 16 g; Refill: 2 - levocetirizine (XYZAL) 5 MG tablet; Take 1 tablet (5 mg total) by mouth every evening.  Dispense: 30 tablet; Refill: 0

## 2018-08-24 NOTE — Patient Instructions (Addendum)
- Take flonase daily in each nostril for at least 2 weeks, take xyzal or another low-dose OTC antihistamine daily,  - If symptoms persists for greater than 2 weeks, do not improve in 3-5 days, or worsen at any time call us and we will refer you to ENT . Eustachian Tube Dysfunction The eustachian tube connects the middle ear to the back of the nose. It regulates air pressure in the middle ear by allowing air to move between the ear and nose. It also helps to drain fluid from the middle ear space. When the eustachian tube does not function properly, air pressure, fluid, or both can build up in the middle ear. Eustachian tube dysfunction can affect one or both ears. What are the causes? This condition happens when the eustachian tube becomes blocked or cannot open normally. This may result from:  Ear infections.  Colds and other upper respiratory infections.  Allergies.  Irritation, such as from cigarette smoke or acid from the stomach coming up into the esophagus (gastroesophageal reflux).  Sudden changes in air pressure, such as from descending in an airplane.  Abnormal growths in the nose or throat, such as nasal polyps, tumors, or enlarged tissue at the back of the throat (adenoids).  What increases the risk? This condition may be more likely to develop in people who smoke and people who are overweight. Eustachian tube dysfunction may also be more likely to develop in children, especially children who have:  Certain birth defects of the mouth, such as cleft palate.  Large tonsils and adenoids.  What are the signs or symptoms? Symptoms of this condition may include:  A feeling of fullness in the ear.  Ear pain.  Clicking or popping noises in the ear.  Ringing in the ear.  Hearing loss.  Loss of balance.  Symptoms may get worse when the air pressure around you changes, such as when you travel to an area of high elevation or fly on an airplane. How is this diagnosed? This  condition may be diagnosed based on:  Your symptoms.  A physical exam of your ear, nose, and throat.  Tests, such as those that measure: ? The movement of your eardrum (tympanogram). ? Your hearing (audiometry).  How is this treated? Treatment depends on the cause and severity of your condition. If your symptoms are mild, you may be able to relieve your symptoms by moving air into ("popping") your ears. If you have symptoms of fluid in your ears, treatment may include:  Decongestants.  Antihistamines.  Nasal sprays or ear drops that contain medicines that reduce swelling (steroids).  In some cases, you may need to have a procedure to drain the fluid in your eardrum (myringotomy). In this procedure, a small tube is placed in the eardrum to:  Drain the fluid.  Restore the air in the middle ear space.  Follow these instructions at home:  Take over-the-counter and prescription medicines only as told by your health care provider.  Use techniques to help pop your ears as recommended by your health care provider. These may include: ? Chewing gum. ? Yawning. ? Frequent, forceful swallowing. ? Closing your mouth, holding your nose closed, and gently blowing as if you are trying to blow air out of your nose.  Do not do any of the following until your health care provider approves: ? Travel to high altitudes. ? Fly in airplanes. ? Work in a Pension scheme manager or room. ? Scuba dive.  Keep your ears dry. Dry  your ears completely after showering or bathing.  Do not smoke.  Keep all follow-up visits as told by your health care provider. This is important. Contact a health care provider if:  Your symptoms do not go away after treatment.  Your symptoms come back after treatment.  You are unable to pop your ears.  You have: ? A fever. ? Pain in your ear. ? Pain in your head or neck. ? Fluid draining from your ear.  Your hearing suddenly changes.  You become very  dizzy.  You lose your balance. This information is not intended to replace advice given to you by your health care provider. Make sure you discuss any questions you have with your health care provider. Document Released: 09/25/2015 Document Revised: 02/04/2016 Document Reviewed: 09/17/2014 Elsevier Interactive Patient Education  [employer].

## 2018-08-30 ENCOUNTER — Other Ambulatory Visit: Payer: Self-pay

## 2018-08-30 ENCOUNTER — Emergency Department
Admission: EM | Admit: 2018-08-30 | Discharge: 2018-08-30 | Disposition: A | Discharge status: Home / Self Care | Payer: Medicare HMO | Attending: Emergency Medicine | Admitting: Emergency Medicine

## 2018-08-30 ENCOUNTER — Emergency Department: Payer: Medicare HMO

## 2018-08-30 DIAGNOSIS — Z794 Long term (current) use of insulin: Secondary | ICD-10-CM | POA: Insufficient documentation

## 2018-08-30 DIAGNOSIS — E1140 Type 2 diabetes mellitus with diabetic neuropathy, unspecified: Secondary | ICD-10-CM | POA: Diagnosis not present

## 2018-08-30 DIAGNOSIS — Z79899 Other long term (current) drug therapy: Secondary | ICD-10-CM | POA: Diagnosis not present

## 2018-08-30 DIAGNOSIS — Z85828 Personal history of other malignant neoplasm of skin: Secondary | ICD-10-CM | POA: Insufficient documentation

## 2018-08-30 DIAGNOSIS — R112 Nausea with vomiting, unspecified: Secondary | ICD-10-CM | POA: Diagnosis not present

## 2018-08-30 DIAGNOSIS — I10 Essential (primary) hypertension: Secondary | ICD-10-CM | POA: Diagnosis not present

## 2018-08-30 DIAGNOSIS — I252 Old myocardial infarction: Secondary | ICD-10-CM | POA: Insufficient documentation

## 2018-08-30 DIAGNOSIS — Z87891 Personal history of nicotine dependence: Secondary | ICD-10-CM | POA: Diagnosis not present

## 2018-08-30 DIAGNOSIS — R42 Dizziness and giddiness: Secondary | ICD-10-CM | POA: Insufficient documentation

## 2018-08-30 DIAGNOSIS — Z7982 Long term (current) use of aspirin: Secondary | ICD-10-CM | POA: Insufficient documentation

## 2018-08-30 LAB — COMPREHENSIVE METABOLIC PANEL
ALT: 33 U/L (ref 0–44)
AST: 23 U/L (ref 15–41)
Albumin: 3.6 g/dL (ref 3.5–5.0)
Alkaline Phosphatase: 45 U/L (ref 38–126)
Anion gap: 8 (ref 5–15)
BILIRUBIN TOTAL: 0.5 mg/dL (ref 0.3–1.2)
BUN: 21 mg/dL (ref 8–23)
CO2: 23 mmol/L (ref 22–32)
Calcium: 8.4 mg/dL — ABNORMAL LOW (ref 8.9–10.3)
Chloride: 108 mmol/L (ref 98–111)
Creatinine, Ser: 1.01 mg/dL (ref 0.61–1.24)
GFR calc Af Amer: >60 mL/min (ref >60)
GFR calc non Af Amer: >60 mL/min (ref >60)
Glucose, Bld: 199 mg/dL — ABNORMAL HIGH (ref 70–99)
Potassium: 3.5 mmol/L (ref 3.5–5.1)
Sodium: 139 mmol/L (ref 135–145)
TOTAL PROTEIN: 6.4 g/dL — AB (ref 6.5–8.1)

## 2018-08-30 LAB — CBC
HEMATOCRIT: 35.4 % — AB (ref 39.0–52.0)
Hemoglobin: 11.9 g/dL — ABNORMAL LOW (ref 13.0–17.0)
MCH: 33 pg (ref 26.0–34.0)
MCHC: 33.6 g/dL (ref 30.0–36.0)
MCV: 98.1 fL (ref 80.0–100.0)
Platelets: 125 10*3/uL — ABNORMAL LOW (ref 150–400)
RBC: 3.61 MIL/uL — ABNORMAL LOW (ref 4.22–5.81)
RDW: 12.8 % (ref 11.5–15.5)
WBC: 9.2 10*3/uL (ref 4.0–10.5)
nRBC: 0 % (ref 0.0–0.2)

## 2018-08-30 LAB — TROPONIN I: Troponin I: <0.03 ng/mL (ref <0.03)

## 2018-08-30 MED ORDER — ONDANSETRON HCL 4 MG/2ML IJ SOLN
INTRAMUSCULAR | Status: AC
  Administered 2018-08-30: 4 mg via INTRAVENOUS
  Filled 2018-08-30: qty 2

## 2018-08-30 MED ORDER — MECLIZINE HCL 25 MG PO TABS
25.0000 mg | ORAL_TABLET | Freq: Once | ORAL | Status: AC
  Administered 2018-08-30: 25 mg via ORAL
  Filled 2018-08-30: qty 1

## 2018-08-30 MED ORDER — METOCLOPRAMIDE HCL 5 MG/ML IJ SOLN
10.0000 mg | Freq: Once | INTRAMUSCULAR | Status: AC
  Administered 2018-08-30: 10 mg via INTRAVENOUS
  Filled 2018-08-30: qty 2

## 2018-08-30 MED ORDER — SODIUM CHLORIDE 0.9 % IV BOLUS
1000.0000 mL | Freq: Once | INTRAVENOUS | Status: AC
  Administered 2018-08-30: 1000 mL via INTRAVENOUS

## 2018-08-30 MED ORDER — GADOBUTROL 1 MMOL/ML IV SOLN
8.0000 mL | Freq: Once | INTRAVENOUS | Status: AC | PRN
  Administered 2018-08-30: 8 mL via INTRAVENOUS

## 2018-08-30 MED ORDER — ONDANSETRON HCL 4 MG/2ML IJ SOLN
4.0000 mg | Freq: Once | INTRAMUSCULAR | Status: AC
  Administered 2018-08-30: 4 mg via INTRAVENOUS

## 2018-08-30 MED ORDER — LORAZEPAM 2 MG/ML IJ SOLN
0.5000 mg | Freq: Once | INTRAMUSCULAR | Status: AC
  Administered 2018-08-30: 0.5 mg via INTRAVENOUS
  Filled 2018-08-30: qty 1

## 2018-08-30 MED ORDER — MECLIZINE HCL 25 MG PO TABS: 25.0000 mg | ORAL_TABLET | Freq: Three times a day (TID) | ORAL | 0 refills | Status: DC | PRN

## 2018-08-30 NOTE — ED Notes (Signed)
Pt states he is currently not as nauseated as he was previously. Pt able to swallow meclizine at this time without increasing nausea.

## 2018-08-30 NOTE — ED Notes (Signed)
Pt currently vomiting again and feeling very nauseous

## 2018-08-30 NOTE — ED Notes (Signed)
Pt returned from MRI °

## 2018-08-30 NOTE — ED Provider Notes (Signed)
Signout from Dr. Owens Shark in this 84 year old male with history of "inner ear problems" who was presented to the emergency department with vertigo as well as nausea and vomiting.  Symptoms now improved after medication.  Pending MRI to rule out stroke.  Patient with positive findings for nystagmus.  Physical Exam  BP 122/62   Pulse (!) 55   Resp 14   Ht 5\' 10"  (1.778 m)   Wt 81.6 kg   SpO2 96%   BMI 25.83 kg/m  ----------------------------------------- 7:59 AM on 08/30/2018 -----------------------------------------   Physical Exam Patient at this time without any complaints.  Sleeping when I enter the room and easily awoken.  Negative for nystagmus.  Patient denies dizziness.  Denies nausea at this time. ED Course/Procedures     Procedures  MDM  MRI negative for acute stroke.  Sinus disease present.  Patient will be prescribed meclizine and will be discharged.  Patient to follow-up with his ENT, Dr. Tami Ribas.       Orbie Pyo, MD 08/30/18 0800

## 2018-08-30 NOTE — ED Notes (Signed)
Pt taken to MRI at this time

## 2018-08-30 NOTE — ED Provider Notes (Signed)
[facility] Emergency Department Provider Note    First MD Initiated Contact with Patient 08/30/18 [phone]     (approximate)  I have reviewed the triage vital signs and the nursing notes.   HISTORY  Chief Complaint Dizziness    HPI VARDAAN DEPASCALE is a 84 y.o. male with below list of chronic medical conditions presents to the emergency department via EMS with history of awakening this morning at 3 AM with dizziness, nausea and vomiting.  Patient denies any chest pain no shortness of breath.  Patient denies any abdominal pain constipation or diarrhea.   Past Medical History:  Diagnosis Date  . Arthritis   . Cancer [facility])    SKIN CANCER  . Diabetes mellitus without complication ([facility])   . GERD (gastroesophageal reflux disease)   . Hypertension   . Myocardial infarction [facility])    08/27/1979    Patient Active Problem List   Diagnosis Date Noted  . Spinal stenosis of lumbar region 09/13/2017  . Spinal stenosis of lumbar region with neurogenic claudication 05/03/2017  . Type 2 diabetes mellitus without complication, with long-term current use of insulin ([place]) 05/03/2017  . Hyperkalemia 04/18/2017  . Left hip pain 07/28/2016  . ALT (SGPT) level raised 07/13/2015  . At risk for falling 07/13/2015  . Arthritis of hand, degenerative 07/13/2015  . Gout 07/13/2015  . Glaucoma 07/13/2015  . Diabetic neuropathy ([place]) 07/13/2015  . Hypertension 04/09/2015  . Dyslipidemia 04/09/2015  . Hypertriglyceridemia 04/09/2015  . Benign fibroma of prostate 04/09/2015  . Acid reflux 04/09/2015  . History of melanoma excision 04/09/2015  . History of squamous cell carcinoma 05/23/2013  . H/O adenomatous polyp of colon 08/25/2010  . History of MI (myocardial infarction) 08/27/1979    Past Surgical History:  Procedure Laterality Date  . CATARACT EXTRACTION W/ INTRAOCULAR LENS  IMPLANT, BILATERAL    . CIRCUMCISION    . GANGLION CYST EXCISION     LEFT HAND  .  LUMBAR LAMINECTOMY/DECOMPRESSION MICRODISCECTOMY  09/13/2017   Procedure: BILATERAL Lumbar three-four , Left  Lumbar four-five  AND Left  L5-S1, LAMINECTOMY/FORAMINOTOMY;  Surgeon: [name], MD;  Location: [place];  Service: Neurosurgery;;  . MELANOMA EXCISION  07/2011  . TONSILLECTOMY    . VASECTOMY      Prior to Admission medications   Medication Sig Start Date End Date Taking? Authorizing Provider  ACCU-CHEK FASTCLIX LANCETS MISC CHECK  BLOOD  SUGAR TWICE DAILY 05/15/15   Ashok Norris, MD  allopurinol (ZYLOPRIM) 100 MG tablet Take 1 tablet (100 mg total) by mouth 2 (two) times daily. 07/26/18   Steele Sizer, MD  amLODipine (NORVASC) 5 MG tablet Take 2 tablets (10 mg total) by mouth daily. 07/26/18   Steele Sizer, MD  aspirin 81 MG tablet Take 81 mg by mouth. 10/08/10   [provider]  atorvastatin (LIPITOR) 10 MG tablet TAKE 1 TABLET DAILY AT 6 PM. 07/26/18   Steele Sizer, MD  Blood Glucose Monitoring Suppl (ACCU-CHEK NANO SMARTVIEW) W/DEVICE KIT USE AS DIRECTED 05/15/15   Ashok Norris, MD  Cholecalciferol (VITAMIN D) 2000 units tablet Take 2,000 Units by mouth daily. 02/02/17   [provider]  dorzolamide (TRUSOPT) 2 % ophthalmic solution Place 1 drop into the left eye 2 (two) times daily.  02/28/15   Dingeldein, Remo Lipps, MD  doxazosin (CARDURA) 4 MG tablet Take 1 tablet (4 mg total) by mouth at bedtime. 07/26/18   Steele Sizer, MD  famotidine (PEPCID) 20 MG tablet Take 1 tablet (  20 mg total) by mouth daily as needed for heartburn or indigestion. In place of Ranitidine 07/26/18   Steele Sizer, MD  finasteride (PROSCAR) 5 MG tablet Take 1 tablet (5 mg total) by mouth at bedtime. 07/26/18   Steele Sizer, MD  fluticasone (FLONASE) 50 MCG/ACT nasal spray Place 1 spray into both nostrils daily. 08/24/18   Poulose, Bethel Born, NP  glipiZIDE (GLUCOTROL) 10 MG tablet Take 1 tablet (10 mg total) by mouth 2 (two) times daily before a meal. 07/26/18   Sowles,  Drue Stager, MD  glucose blood (COOL BLOOD GLUCOSE TEST STRIPS) test strip Use as instructed 03/03/15   Roselee Nova, MD  Insulin Glargine (LANTUS SOLOSTAR) 100 UNIT/ML Solostar Pen Inject 48 Units into the skin at bedtime. 07/26/18   Steele Sizer, MD  Insulin Pen Needle (B-D ULTRAFINE III SHORT PEN) 31G X 8 MM MISC USE AS DIRECTED WITH INSULIN PEN. 03/19/18   Ancil Boozer, Drue Stager, MD  levocetirizine (XYZAL) 5 MG tablet Take 1 tablet (5 mg total) by mouth every evening. 08/24/18   Poulose, Bethel Born, NP  lisinopril (PRINIVIL,ZESTRIL) 40 MG tablet Take 1 tablet (40 mg total) by mouth daily. 07/26/18   Steele Sizer, MD  metFORMIN (GLUCOPHAGE) 1000 MG tablet Take 1 tablet (1,000 mg total) by mouth 2 (two) times daily with a meal. 07/26/18   Sowles, Drue Stager, MD  Multiple Vitamins-Minerals (CENTRUM SILVER 50+MEN) TABS Take 1 tablet by mouth daily.    [provider]  NEEDLE, DISP, 14 G 14G X 1" [place]  12/29/11   [provider]  omega-3 fish oil (MAXEPA) 1000 MG CAPS capsule Take 1 capsule by mouth daily.    [provider]  timolol (TIMOPTIC) 0.5 % ophthalmic solution Place 1 drop into the left eye 2 (two) times daily.  01/17/11   Dingeldein, Remo Lipps, MD  vitamin B-12 (CYANOCOBALAMIN) 500 MCG tablet Take 1 tablet by mouth daily. 06/15/17   [provider]    Allergies Tetracyclines & related  Family History  Problem Relation Age of Onset  . Heart disease Mother   . Diabetes Mother        type 2  . Angina Mother   . Heart disease Brother   . Pneumonia Father   . Heart attack Father   . Alcoholism Daughter   . Cirrhosis Daughter     Social History Social History   Tobacco Use  . Smoking status: Former Smoker    Packs/day: 2.00    Years: 13.00    Pack years: 26.00    Types: Cigarettes    Last attempt to quit: 1963    Years since quitting: 57.0  . Smokeless tobacco: Never Used  . Tobacco comment: smoking cessation materials not required  Substance Use  Topics  . Alcohol use: Yes    Alcohol/week: 2.0 standard drinks    Types: 2 Shots of liquor per week    Comment: occasional  . Drug use: No    Review of Systems Constitutional: No fever/chills Eyes: No visual changes. ENT: No sore throat. Cardiovascular: Denies chest pain. Respiratory: Denies shortness of breath. Gastrointestinal: No abdominal pain.  Positive for nausea and vomiting no diarrhea.  No constipation. Genitourinary: Negative for dysuria. Musculoskeletal: Negative for neck pain.  Negative for back pain. Integumentary: Negative for rash. Neurological: Negative for headaches, focal weakness or numbness.  Positive for dizziness   ____________________________________________   PHYSICAL EXAM:  VITAL SIGNS: ED Triage Vitals [08/30/18 0438]  Enc Vitals Group  BP (!) 142/69     Pulse Rate (!) 55     Resp 20     Temp      Temp src      SpO2 100 %     Weight 81.6 kg (180 lb)     Height 1.778 m (_0 )     Head Circumference      Peak Flow      Pain Score 0     Pain Loc      Pain Edu?      Excl. in [place]?     Constitutional: Alert and oriented. Well appearing and in no acute distress. Eyes: Conjunctivae are normal.  Bilateral horizontal nystagmus Mouth/Throat: Mucous membranes are moist.  Oropharynx non-erythematous. Neck: No stridor.  Cardiovascular: Normal rate, regular rhythm. Good peripheral circulation. Grossly normal heart sounds. Respiratory: Normal respiratory effort.  No retractions. Lungs CTAB. Gastrointestinal: Soft and nontender. No distention.  Musculoskeletal: No lower extremity tenderness nor edema. No gross deformities of extremities. Neurologic:  Normal speech and language. No gross focal neurologic deficits are appreciated.  Skin:  Skin is warm, dry and intact. No rash noted. Psychiatric: Mood and affect are normal. Speech and behavior are normal.  ____________________________________________   LABS (all labs ordered are listed, but only  abnormal results are displayed)  Labs Reviewed  CBC - Abnormal; Notable for the following components:      Result Value   RBC 3.61 (*)    Hemoglobin 11.9 (*)    HCT 35.4 (*)    Platelets 125 (*)    All other components within normal limits  COMPREHENSIVE METABOLIC PANEL - Abnormal; Notable for the following components:   Glucose, Bld 199 (*)    Calcium 8.4 (*)    Total Protein 6.4 (*)    All other components within normal limits  TROPONIN I   ____________________________________________  EKG ED ECG REPORT I, [place] N Candra Wegner, the attending physician, personally viewed and interpreted this ECG.   Date: 08/30/2018  EKG Time: 4:36 AM  Rate: 54  Rhythm: Sinus rhythm  Axis: Normal  Intervals: Normal  ST&T Change: None   RADIOLOGY I, [place] N Arabella Revelle, personally viewed and evaluated these images (plain radiographs) as part of my medical decision making, as well as reviewing the written report by the radiologist.  ED MD interpretation: No acute intracranial abnormality noted on CT head mucosal thickening of the paranasal sinuses noted as well as a left mastoid effusion per radiologist.  Official radiology report(s): Ct Head Wo Contrast  Result Date: 08/30/2018 CLINICAL DATA:  Acute onset dizziness, nausea, and vomiting. EXAM: CT HEAD WITHOUT CONTRAST TECHNIQUE: Contiguous axial images were obtained from the base of the skull through the vertex without intravenous contrast. COMPARISON:  None. FINDINGS: Brain: Diffuse cerebral atrophy. Ventricular dilatation consistent with central atrophy. Patchy low-attenuation in the deep white matter likely representing small vessel ischemia. No evidence of acute infarction, hemorrhage, hydrocephalus, extra-axial collection or mass lesion/mass effect. Vascular: Mild to moderate intracranial arterial vascular calcifications. Skull: Calvarium appears intact. Sinuses/Orbits: Mucosal thickening in the paranasal sinuses. Retention cyst in the right  maxillary antrum. No acute air-fluid levels. Opacification of left mastoid air cells. Other: None. IMPRESSION: No acute intracranial abnormalities. Chronic atrophy and small vessel ischemic changes. Left mastoid effusions. Electronically Signed   By: Lucienne Capers M.D.   On: 08/30/2018 05:09      Procedures   ____________________________________________   INITIAL IMPRESSION / ASSESSMENT AND PLAN / ED COURSE  As part of  my medical decision making, I reviewed the following data within the [place] NUMBER 84 year old male presenting with above-stated history and physical exam secondary to dizziness and vomiting.  Considered possibility of peripheral versus central vertigo CT head performed which revealed left mastoid effusion as well as paranasal mucosal thickening.  Patient given meclizine Zofran and Reglan in the emergency department with improvement of dizziness however nausea persists MRI of the brain pending at this time.  If MRI negative anticipate the patient will be able to be discharged home on meclizine.  Patient's care transferred to Dr. Clearnce Hasten ____________________________________________  FINAL CLINICAL IMPRESSION(S) / ED DIAGNOSES  Final diagnoses:  Vertigo     MEDICATIONS GIVEN DURING THIS VISIT:  Medications  sodium chloride 0.9 % bolus 1,000 mL (0 mLs Intravenous Stopped 08/30/18 0648)  ondansetron (ZOFRAN) injection 4 mg (4 mg Intravenous Given 08/30/18 0440)  meclizine (ANTIVERT) tablet 25 mg (25 mg Oral Given 08/30/18 0516)  metoCLOPramide (REGLAN) injection 10 mg (10 mg Intravenous Given 08/30/18 0537)  LORazepam (ATIVAN) injection 0.5 mg (0.5 mg Intravenous Given 08/30/18 0723)  gadobutrol (GADAVIST) 1 MMOL/ML injection 8 mL (8 mLs Intravenous Contrast Given 08/30/18 1657)     ED Discharge Orders    None       Note:  This document was prepared using Dragon voice recognition software and may include unintentional dictation errors.      Gregor Hams, MD 08/30/18 [id number]

## 2018-08-30 NOTE — ED Triage Notes (Signed)
Pt brought brought in by [facility] from home for acute onset of dizziness with n.v.d that started at 0300 this am. Pt denies any pain or diarrhea. nO hx of the same.

## 2018-08-30 NOTE — ED Notes (Signed)
Pt. Moved from Room #3 to quad room #22.

## 2018-09-17 ENCOUNTER — Encounter: Payer: Medicare HMO | Attending: Physician Assistant | Admitting: Physician Assistant

## 2018-09-17 DIAGNOSIS — E119 Type 2 diabetes mellitus without complications: Secondary | ICD-10-CM | POA: Insufficient documentation

## 2018-09-17 DIAGNOSIS — I252 Old myocardial infarction: Secondary | ICD-10-CM | POA: Diagnosis not present

## 2018-09-17 DIAGNOSIS — Z87891 Personal history of nicotine dependence: Secondary | ICD-10-CM | POA: Diagnosis not present

## 2018-09-17 DIAGNOSIS — H9121 Sudden idiopathic hearing loss, right ear: Secondary | ICD-10-CM | POA: Diagnosis present

## 2018-09-17 DIAGNOSIS — Z85828 Personal history of other malignant neoplasm of skin: Secondary | ICD-10-CM | POA: Insufficient documentation

## 2018-09-18 NOTE — Progress Notes (Signed)
mmHg. Eyes conjunctiva clear no eyelid edema noted. pupils equal round and reactive to light and accommodation. Ears, Nose, Mouth, and Throat no gross abnormality of ear auricles or external auditory canals. patient has hearing loss on the right side. He also appears to have inflammation/hemorrhage noted in a small  section of the TM around the 9 o clock location. This appears to be resolving. mucus membranes moist. Respiratory normal breathing without difficulty. clear to auscultation bilaterally. Cardiovascular regular rate and rhythm with normal S1, S2. no clubbing, cyanosis, significant edema, Gastrointestinal (GI) soft, non-tender, non-distended, +BS. no ventral hernia noted. Musculoskeletal unsteady while walking at times due to the dizziness. no significant deformity or arthritic changes, no loss or range of motion, no clubbing. Psychiatric this patient is able to make decisions and demonstrates good insight into disease process. Alert and Oriented x 3. pleasant and cooperative. General Notes: Currently the patient physically did not appear to have any issues going on at this time. In fact when I was evaluating his eardrum per above there appears to be just some resolving inflammation/image of the eardrum itself no significant fluid buildup behind the TM. BRANDT, CHANEY ([id number]) Assessment Active Problems ICD-10 Sudden idiopathic hearing loss, right ear Type 2 diabetes mellitus with other specified complication Essential (primary) hypertension Plan Hyperbaric Oxygen Therapy: Evaluate for HBO Therapy Indication: - Right senso neural hearing loss 2.0 ATA for 90 Minutes without Air Breaks One treatment per day (delivered Monday through Friday unless otherwise specified in Special Instructions below): Total # of Treatments: - 20 Finger stick Blood Glucose Pre- and Post- HBOT Treatment. Follow Hyperbaric Oxygen Glycemia Protocol The following medication(s) was prescribed: meclizine oral 25 mg tablet 1 1 tablet oral taken 2 times a day as needed for dizziness starting 09/17/2018 At this point I had a very long discussion with the patient concerning hyperbaric option therapy and what it could potentially do for him in the situation that he is experiencing. With that being said the  patient appear to be very adamantly against proceeding with HBO therapy at this time. He states that his biggest issue is the dizziness and he really feels like that he would prefer to attempt medications of some sort to help with this currently. He was previously given meclizine which can potentially be beneficial for the vertigo although he's not sure if he has any remaining. For that reason I did give him a prescription for meclizine I did put in the orders for HBO therapy as again I do believe that he could be a candidate for said therapy. Nonetheless if he decides that he would like to proceed after discussing this with Dr. Tami Ribas I will be more than happy to check his insurance to see whether not this is something that he would qualify for from an insurance standpoint. He was appreciative of the evaluation and information. Electronic Signature(s) Signed: 09/18/2018 7:11:38 AM By: Worthy Keeler PA-C Entered By: Worthy Keeler on 09/18/2018 05:04:32 Rennie, Corena Herter ([id number]) -------------------------------------------------------------------------------- ROS/PFSH Details [name] Date of Service: 09/17/2018 8:45 AM Medical Record Number: [id number] Patient Account Number: [id number] Date of Birth/Sex: [birth year] (85 y.o. M) Treating RN: Montey Hora Primary Care Provider: Steele Sizer Other Clinician: Referring Provider: Beverly Gust Treating Provider/Extender: Melburn Hake, Deveney Bayon Weeks in Treatment: 0 Information Obtained From Patient Wound History Do you currently have one or more open woundso No Have you tested positive for osteomyelitis (bone infection)o No Have you had any tests for circulation on your  mmHg. Eyes conjunctiva clear no eyelid edema noted. pupils equal round and reactive to light and accommodation. Ears, Nose, Mouth, and Throat no gross abnormality of ear auricles or external auditory canals. patient has hearing loss on the right side. He also appears to have inflammation/hemorrhage noted in a small  section of the TM around the 9 o clock location. This appears to be resolving. mucus membranes moist. Respiratory normal breathing without difficulty. clear to auscultation bilaterally. Cardiovascular regular rate and rhythm with normal S1, S2. no clubbing, cyanosis, significant edema, Gastrointestinal (GI) soft, non-tender, non-distended, +BS. no ventral hernia noted. Musculoskeletal unsteady while walking at times due to the dizziness. no significant deformity or arthritic changes, no loss or range of motion, no clubbing. Psychiatric this patient is able to make decisions and demonstrates good insight into disease process. Alert and Oriented x 3. pleasant and cooperative. General Notes: Currently the patient physically did not appear to have any issues going on at this time. In fact when I was evaluating his eardrum per above there appears to be just some resolving inflammation/image of the eardrum itself no significant fluid buildup behind the TM. BRANDT, CHANEY ([id number]) Assessment Active Problems ICD-10 Sudden idiopathic hearing loss, right ear Type 2 diabetes mellitus with other specified complication Essential (primary) hypertension Plan Hyperbaric Oxygen Therapy: Evaluate for HBO Therapy Indication: - Right senso neural hearing loss 2.0 ATA for 90 Minutes without Air Breaks One treatment per day (delivered Monday through Friday unless otherwise specified in Special Instructions below): Total # of Treatments: - 20 Finger stick Blood Glucose Pre- and Post- HBOT Treatment. Follow Hyperbaric Oxygen Glycemia Protocol The following medication(s) was prescribed: meclizine oral 25 mg tablet 1 1 tablet oral taken 2 times a day as needed for dizziness starting 09/17/2018 At this point I had a very long discussion with the patient concerning hyperbaric option therapy and what it could potentially do for him in the situation that he is experiencing. With that being said the  patient appear to be very adamantly against proceeding with HBO therapy at this time. He states that his biggest issue is the dizziness and he really feels like that he would prefer to attempt medications of some sort to help with this currently. He was previously given meclizine which can potentially be beneficial for the vertigo although he's not sure if he has any remaining. For that reason I did give him a prescription for meclizine I did put in the orders for HBO therapy as again I do believe that he could be a candidate for said therapy. Nonetheless if he decides that he would like to proceed after discussing this with Dr. Tami Ribas I will be more than happy to check his insurance to see whether not this is something that he would qualify for from an insurance standpoint. He was appreciative of the evaluation and information. Electronic Signature(s) Signed: 09/18/2018 7:11:38 AM By: Worthy Keeler PA-C Entered By: Worthy Keeler on 09/18/2018 05:04:32 Rennie, Corena Herter ([id number]) -------------------------------------------------------------------------------- ROS/PFSH Details [name] Date of Service: 09/17/2018 8:45 AM Medical Record Number: [id number] Patient Account Number: [id number] Date of Birth/Sex: [birth year] (85 y.o. M) Treating RN: Montey Hora Primary Care Provider: Steele Sizer Other Clinician: Referring Provider: Beverly Gust Treating Provider/Extender: Melburn Hake, Deveney Bayon Weeks in Treatment: 0 Information Obtained From Patient Wound History Do you currently have one or more open woundso No Have you tested positive for osteomyelitis (bone infection)o No Have you had any tests for circulation on your  mmHg. Eyes conjunctiva clear no eyelid edema noted. pupils equal round and reactive to light and accommodation. Ears, Nose, Mouth, and Throat no gross abnormality of ear auricles or external auditory canals. patient has hearing loss on the right side. He also appears to have inflammation/hemorrhage noted in a small  section of the TM around the 9 o clock location. This appears to be resolving. mucus membranes moist. Respiratory normal breathing without difficulty. clear to auscultation bilaterally. Cardiovascular regular rate and rhythm with normal S1, S2. no clubbing, cyanosis, significant edema, Gastrointestinal (GI) soft, non-tender, non-distended, +BS. no ventral hernia noted. Musculoskeletal unsteady while walking at times due to the dizziness. no significant deformity or arthritic changes, no loss or range of motion, no clubbing. Psychiatric this patient is able to make decisions and demonstrates good insight into disease process. Alert and Oriented x 3. pleasant and cooperative. General Notes: Currently the patient physically did not appear to have any issues going on at this time. In fact when I was evaluating his eardrum per above there appears to be just some resolving inflammation/image of the eardrum itself no significant fluid buildup behind the TM. BRANDT, CHANEY ([id number]) Assessment Active Problems ICD-10 Sudden idiopathic hearing loss, right ear Type 2 diabetes mellitus with other specified complication Essential (primary) hypertension Plan Hyperbaric Oxygen Therapy: Evaluate for HBO Therapy Indication: - Right senso neural hearing loss 2.0 ATA for 90 Minutes without Air Breaks One treatment per day (delivered Monday through Friday unless otherwise specified in Special Instructions below): Total # of Treatments: - 20 Finger stick Blood Glucose Pre- and Post- HBOT Treatment. Follow Hyperbaric Oxygen Glycemia Protocol The following medication(s) was prescribed: meclizine oral 25 mg tablet 1 1 tablet oral taken 2 times a day as needed for dizziness starting 09/17/2018 At this point I had a very long discussion with the patient concerning hyperbaric option therapy and what it could potentially do for him in the situation that he is experiencing. With that being said the  patient appear to be very adamantly against proceeding with HBO therapy at this time. He states that his biggest issue is the dizziness and he really feels like that he would prefer to attempt medications of some sort to help with this currently. He was previously given meclizine which can potentially be beneficial for the vertigo although he's not sure if he has any remaining. For that reason I did give him a prescription for meclizine I did put in the orders for HBO therapy as again I do believe that he could be a candidate for said therapy. Nonetheless if he decides that he would like to proceed after discussing this with Dr. Tami Ribas I will be more than happy to check his insurance to see whether not this is something that he would qualify for from an insurance standpoint. He was appreciative of the evaluation and information. Electronic Signature(s) Signed: 09/18/2018 7:11:38 AM By: Worthy Keeler PA-C Entered By: Worthy Keeler on 09/18/2018 05:04:32 Rennie, Corena Herter ([id number]) -------------------------------------------------------------------------------- ROS/PFSH Details [name] Date of Service: 09/17/2018 8:45 AM Medical Record Number: [id number] Patient Account Number: [id number] Date of Birth/Sex: [birth year] (85 y.o. M) Treating RN: Montey Hora Primary Care Provider: Steele Sizer Other Clinician: Referring Provider: Beverly Gust Treating Provider/Extender: Melburn Hake, Deveney Bayon Weeks in Treatment: 0 Information Obtained From Patient Wound History Do you currently have one or more open woundso No Have you tested positive for osteomyelitis (bone infection)o No Have you had any tests for circulation on your  mmHg. Eyes conjunctiva clear no eyelid edema noted. pupils equal round and reactive to light and accommodation. Ears, Nose, Mouth, and Throat no gross abnormality of ear auricles or external auditory canals. patient has hearing loss on the right side. He also appears to have inflammation/hemorrhage noted in a small  section of the TM around the 9 o clock location. This appears to be resolving. mucus membranes moist. Respiratory normal breathing without difficulty. clear to auscultation bilaterally. Cardiovascular regular rate and rhythm with normal S1, S2. no clubbing, cyanosis, significant edema, Gastrointestinal (GI) soft, non-tender, non-distended, +BS. no ventral hernia noted. Musculoskeletal unsteady while walking at times due to the dizziness. no significant deformity or arthritic changes, no loss or range of motion, no clubbing. Psychiatric this patient is able to make decisions and demonstrates good insight into disease process. Alert and Oriented x 3. pleasant and cooperative. General Notes: Currently the patient physically did not appear to have any issues going on at this time. In fact when I was evaluating his eardrum per above there appears to be just some resolving inflammation/image of the eardrum itself no significant fluid buildup behind the TM. BRANDT, CHANEY ([id number]) Assessment Active Problems ICD-10 Sudden idiopathic hearing loss, right ear Type 2 diabetes mellitus with other specified complication Essential (primary) hypertension Plan Hyperbaric Oxygen Therapy: Evaluate for HBO Therapy Indication: - Right senso neural hearing loss 2.0 ATA for 90 Minutes without Air Breaks One treatment per day (delivered Monday through Friday unless otherwise specified in Special Instructions below): Total # of Treatments: - 20 Finger stick Blood Glucose Pre- and Post- HBOT Treatment. Follow Hyperbaric Oxygen Glycemia Protocol The following medication(s) was prescribed: meclizine oral 25 mg tablet 1 1 tablet oral taken 2 times a day as needed for dizziness starting 09/17/2018 At this point I had a very long discussion with the patient concerning hyperbaric option therapy and what it could potentially do for him in the situation that he is experiencing. With that being said the  patient appear to be very adamantly against proceeding with HBO therapy at this time. He states that his biggest issue is the dizziness and he really feels like that he would prefer to attempt medications of some sort to help with this currently. He was previously given meclizine which can potentially be beneficial for the vertigo although he's not sure if he has any remaining. For that reason I did give him a prescription for meclizine I did put in the orders for HBO therapy as again I do believe that he could be a candidate for said therapy. Nonetheless if he decides that he would like to proceed after discussing this with Dr. Tami Ribas I will be more than happy to check his insurance to see whether not this is something that he would qualify for from an insurance standpoint. He was appreciative of the evaluation and information. Electronic Signature(s) Signed: 09/18/2018 7:11:38 AM By: Worthy Keeler PA-C Entered By: Worthy Keeler on 09/18/2018 05:04:32 Rennie, Corena Herter ([id number]) -------------------------------------------------------------------------------- ROS/PFSH Details [name] Date of Service: 09/17/2018 8:45 AM Medical Record Number: [id number] Patient Account Number: [id number] Date of Birth/Sex: [birth year] (85 y.o. M) Treating RN: Montey Hora Primary Care Provider: Steele Sizer Other Clinician: Referring Provider: Beverly Gust Treating Provider/Extender: Melburn Hake, Deveney Bayon Weeks in Treatment: 0 Information Obtained From Patient Wound History Do you currently have one or more open woundso No Have you tested positive for osteomyelitis (bone infection)o No Have you had any tests for circulation on your  legso No Constitutional Symptoms (General Health) Complaints and Symptoms: Negative for: Fatigue; Fever; Chills; Marked Weight Change Eyes Complaints and Symptoms: Positive for: Glasses / Contacts Negative for: Dry Eyes; Vision  Changes Medical History: Positive for: Cataracts - removed; Glaucoma Negative for: Optic Neuritis Ear/Nose/Mouth/Throat Complaints and Symptoms: Negative for: Difficult clearing ears; Sinusitis Medical History: Negative for: Chronic sinus problems/congestion; Middle ear problems Hematologic/Lymphatic Complaints and Symptoms: Negative for: Bleeding / Clotting Disorders; Human Immunodeficiency Virus Medical History: Negative for: Anemia; Hemophilia; Human Immunodeficiency Virus; Lymphedema; Sickle Cell Disease Respiratory Complaints and Symptoms: Negative for: Chronic or frequent coughs; Shortness of Breath Medical History: Negative for: Aspiration; Asthma; Chronic Obstructive Pulmonary Disease (COPD); Pneumothorax; Sleep Apnea; Tuberculosis Cardiovascular GANON, DEMASI ([id number]) Complaints and Symptoms: Negative for: Chest pain; LE edema Medical History: Positive for: Hypertension; Myocardial Infarction - 2015 Negative for: Angina; Arrhythmia; Congestive Heart Failure; Coronary Artery Disease; Deep Vein Thrombosis; Hypotension; Peripheral Arterial Disease; Peripheral Venous Disease; Phlebitis; Vasculitis Gastrointestinal Complaints and Symptoms: Negative for: Frequent diarrhea; Nausea; Vomiting Medical History: Negative for: Cirrhosis ; Colitis; Crohnos; Hepatitis A; Hepatitis B; Hepatitis C Past Medical History Notes: GERD Endocrine Complaints and Symptoms: Negative for: Hepatitis; Thyroid disease; Polydypsia (Excessive Thirst) Medical History: Positive for: Type II Diabetes Negative for: Type I Diabetes Treated with: Insulin Blood sugar tested every day: Yes Tested : QD Genitourinary Complaints and Symptoms: Negative for: Kidney failure/ Dialysis; Incontinence/dribbling Medical History: Negative for: End Stage Renal Disease Immunological Complaints and Symptoms: Negative for: Hives; Itching Medical History: Negative for: Lupus Erythematosus; Raynaudos;  Scleroderma Integumentary (Skin) Complaints and Symptoms: Negative for: Wounds; Bleeding or bruising tendency; Breakdown; Swelling Medical History: Negative for: History of Burn; History of pressure wounds Musculoskeletal Complaints and Symptoms: Negative for: Muscle Pain; Muscle Weakness Medical History: [place], NO ([id number]) Positive for: Osteoarthritis Negative for: Gout; Rheumatoid Arthritis; Osteomyelitis Neurologic Complaints and Symptoms: Negative for: Numbness/parasthesias; Focal/Weakness Medical History: Negative for: Dementia; Neuropathy; Quadriplegia; Paraplegia; Seizure Disorder Psychiatric Complaints and Symptoms: Negative for: Anxiety; Claustrophobia Medical History: Negative for: Anorexia/bulimia; Confinement Anxiety Oncologic Medical History: Negative for: Received Chemotherapy; Received Radiation Past Medical History Notes: history of skin cancer HBO Extended History Items Eyes: Eyes: Cataracts Glaucoma Immunizations Pneumococcal Vaccine: Received Pneumococcal Vaccination: Yes Implantable Devices Family and Social History Cancer: Yes - Child; Diabetes: Yes - Mother,Siblings; Heart Disease: Yes - Father; Hereditary Spherocytosis: No; Hypertension: Yes - Father,Siblings; Kidney Disease: No; Lung Disease: No; Seizures: No; Stroke: No; Thyroid Problems: No; Tuberculosis: No; Former smoker - quit age 46; Marital Status - Married; Alcohol Use: Never; Drug Use: No History; Caffeine Use: Daily; Financial Concerns: No; Food, Clothing or Shelter Needs: No; Support System Lacking: No; Transportation Concerns: No; Advanced Directives: No; Patient does not want information on Advanced Directives Electronic Signature(s) Signed: 09/17/2018 5:30:25 PM By: Montey Hora Signed: 09/18/2018 7:11:38 AM By: Worthy Keeler PA-C Entered By: Montey Hora on 09/17/2018 09:11:54 Akkerman, Corena Herter  ([id number]) -------------------------------------------------------------------------------- SuperBill Details [name] Date of Service: 09/17/2018 Medical Record Number: [id number] Patient Account Number: [id number] Date of Birth/Sex: 1929-10-01 (85 y.o. M) Treating RN: Harold Barban Primary Care Provider: Steele Sizer Other Clinician: Referring Provider: Beverly Gust Treating Provider/Extender: Melburn Hake, Josi Roediger Weeks in Treatment: 0 Diagnosis Coding ICD-10 Codes Code Description H91.21 Sudden idiopathic hearing loss, right ear E11.69 Type 2 diabetes mellitus with other specified complication V61 Essential (primary) hypertension Physician Procedures CPT4 Code: [id number] Description: WC PHYS LEVEL 3 o NEW PT ICD-10 Diagnosis Description H91.21 Sudden idiopathic hearing loss, right ear E11.69 Type 2  mmHg. Eyes conjunctiva clear no eyelid edema noted. pupils equal round and reactive to light and accommodation. Ears, Nose, Mouth, and Throat no gross abnormality of ear auricles or external auditory canals. patient has hearing loss on the right side. He also appears to have inflammation/hemorrhage noted in a small  section of the TM around the 9 o clock location. This appears to be resolving. mucus membranes moist. Respiratory normal breathing without difficulty. clear to auscultation bilaterally. Cardiovascular regular rate and rhythm with normal S1, S2. no clubbing, cyanosis, significant edema, Gastrointestinal (GI) soft, non-tender, non-distended, +BS. no ventral hernia noted. Musculoskeletal unsteady while walking at times due to the dizziness. no significant deformity or arthritic changes, no loss or range of motion, no clubbing. Psychiatric this patient is able to make decisions and demonstrates good insight into disease process. Alert and Oriented x 3. pleasant and cooperative. General Notes: Currently the patient physically did not appear to have any issues going on at this time. In fact when I was evaluating his eardrum per above there appears to be just some resolving inflammation/image of the eardrum itself no significant fluid buildup behind the TM. BRANDT, CHANEY ([id number]) Assessment Active Problems ICD-10 Sudden idiopathic hearing loss, right ear Type 2 diabetes mellitus with other specified complication Essential (primary) hypertension Plan Hyperbaric Oxygen Therapy: Evaluate for HBO Therapy Indication: - Right senso neural hearing loss 2.0 ATA for 90 Minutes without Air Breaks One treatment per day (delivered Monday through Friday unless otherwise specified in Special Instructions below): Total # of Treatments: - 20 Finger stick Blood Glucose Pre- and Post- HBOT Treatment. Follow Hyperbaric Oxygen Glycemia Protocol The following medication(s) was prescribed: meclizine oral 25 mg tablet 1 1 tablet oral taken 2 times a day as needed for dizziness starting 09/17/2018 At this point I had a very long discussion with the patient concerning hyperbaric option therapy and what it could potentially do for him in the situation that he is experiencing. With that being said the  patient appear to be very adamantly against proceeding with HBO therapy at this time. He states that his biggest issue is the dizziness and he really feels like that he would prefer to attempt medications of some sort to help with this currently. He was previously given meclizine which can potentially be beneficial for the vertigo although he's not sure if he has any remaining. For that reason I did give him a prescription for meclizine I did put in the orders for HBO therapy as again I do believe that he could be a candidate for said therapy. Nonetheless if he decides that he would like to proceed after discussing this with Dr. Tami Ribas I will be more than happy to check his insurance to see whether not this is something that he would qualify for from an insurance standpoint. He was appreciative of the evaluation and information. Electronic Signature(s) Signed: 09/18/2018 7:11:38 AM By: Worthy Keeler PA-C Entered By: Worthy Keeler on 09/18/2018 05:04:32 Rennie, Corena Herter ([id number]) -------------------------------------------------------------------------------- ROS/PFSH Details [name] Date of Service: 09/17/2018 8:45 AM Medical Record Number: [id number] Patient Account Number: [id number] Date of Birth/Sex: [birth year] (85 y.o. M) Treating RN: Montey Hora Primary Care Provider: Steele Sizer Other Clinician: Referring Provider: Beverly Gust Treating Provider/Extender: Melburn Hake, Deveney Bayon Weeks in Treatment: 0 Information Obtained From Patient Wound History Do you currently have one or more open woundso No Have you tested positive for osteomyelitis (bone infection)o No Have you had any tests for circulation on your  result is 101 mg/dl to 249 mg/dl: A. Discharge patient. A. Notify HBO physician and await physician orders. B. It is recommended to do blood/urine If result is 250 mg/dl or greater: ketone testing. C. If the result meets the hospital definition of a critical result, follow hospital policy. *Juice or candies are NOT equivalent products. If patient refuses the Glucerna or Ensure, please consult the hospital dietitian for an appropriate substitute. Electronic Signature(s) Signed: 09/18/2018 7:11:38 AM By: Worthy Keeler PA-C Entered By: Worthy Keeler on 09/17/2018 10:23:20 GURTAJ, RUZ ([id number]) -------------------------------------------------------------------------------- Prescription 09/17/2018 [name] Provider: Worthy Keeler PA-C Date of Birth: 1930/06/08 NPI#: [id number] Sex: Jerilynn Mages DEA#: [id number] Phone #: [phone] License #: Patient Address: [address], [address] [place], [place] [address] [place], [place] [address] [phone] Allergies tetracycline HCl Medication Medication: Route: Strength: Form: meclizine oral 25 mg tablet Class: ANTIEMETIC/ANTIVERTIGO AGENTS Dose: Frequency / Time: Indication: 1 1 tablet oral taken 2 times a day as needed for dizziness Number of Refills: Number of Units: 0 Thirty (30) Tablet(s) Generic Substitution: Start Date: End Date:  Administered at Substitution Permitted 02/13/159 Facility: No Note to Pharmacy: Signature(s): Date(s): Electronic Signature(s) Signed: 09/18/2018 7:11:38 AM By: Worthy Keeler PA-C Entered By: Worthy Keeler on 09/17/2018 10:23:21 Vantol, Corena Herter ([id number]) --------------------------------------------------------------------------------  Problem List Details [name] Date of Service: 09/17/2018 8:45 AM Medical Record Number: [id number] Patient Account Number: [id number] Date of Birth/Sex: Oct 05, 1929 (85 y.o. 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, right ear 09/17/2018 No Yes E11.69 Type 2 diabetes mellitus with other specified complication 0/02/2375 No Yes I10 Essential (primary) hypertension 09/17/2018 No Yes Inactive Problems Resolved Problems Electronic Signature(s) Signed: 09/18/2018 7:11:38 AM By: Worthy Keeler PA-C Entered By: Worthy Keeler on 09/17/2018 09:53:52 Watrous, Corena Herter ([id number]) -------------------------------------------------------------------------------- Progress Note Details [name] Date of Service: 09/17/2018 8:45 AM Medical Record Number: [id number] Patient Account Number: [id number] Date of Birth/Sex: [birth year] (85 y.o. M) Treating RN: Harold Barban Primary Care Provider: Steele Sizer Other Clinician: Referring Provider: Beverly Gust Treating Provider/Extender: Melburn Hake, Shynia Daleo Weeks in Treatment: 0 Subjective Chief Complaint Information obtained from Patient Right hearing loss History of Present Illness (HPI) 09/17/18 on evaluation today patient presents for initial evaluation and clinic concerning issues that  he has been having with sudden acute sensorineural hearing loss which according to record review appears to have been a result of what sounds to be a viral inner ear infection. He [facility]. He was prescribed prednisone at that point. Subsequently he has been having hearing loss in the right year along with dizziness unfortunately. The dizziness has improved some with treatment but still is the biggest issue for him. He is having difficulty being able to function as far as daily activities are concerned including driving which is not possible at this time. With that being said the patient was sent to our clinic for evaluation for the possibility of hyperbaric oxygen therapy. He does have questions about this today which we will address. Otherwise the patient does have a history of being a former smoker although he has no respiratory

## 2018-09-18 NOTE — Progress Notes (Signed)
Tracy [name] ([id number]) Visit Report for 09/17/2018 HBO Risk Assessment Details [name], Tracy [name]. Date of Service: 09/17/2018 8:45 AM Medical Record Number: [id number] Patient Account Number: [id number] Date of Birth/Sex: 20-Dec-1929 (85 y.o. M) Treating RN: Tracy [name] Primary Care Tracy [name]: Tracy [name] Other Clinician: Referring Tracy [name]: Tracy [name] Treating Tracy [name]: Tracy [name], Tracy [name]: 0 HBO Risk Assessment Items Answer Barotrauma Risks: Upper Respiratory Infections No Prior Radiation [name] to Head/Neck No Tracheostomy No Ear problems or surgery (otosclerosis)- Consider pressure equalization tubes Yes Sinus Problems, Sinus Obstruction No Pulmonary Risks: Currently seeing a pulmonologisto No Emphysema No Pneumothorax No Tuberculosis No Other lung problems (COPD with CO2 retention, lesions, surgery) -Refer to CPGs No Congestive heart Failure -Consider holding HBO if ejection fraction<30% No History of smoking No Bullous Disease, Blebs No Other pulmonary abnormalities No Cardiac Risks: Currently seeing a cardiologisto No Pacemaker/AICD No Hypertension Yes Diuretic Used (water pill). If yes, last time taken: No History of prior or current malignancy (Cancer) Surgery No Radiation therapy No Chemotherapy No Ophthalmic Risks: Optic Neuritis No Cataracts No Myopia No Retinopathy or Retinal Detachment Surgery- Consider pressure equalization tubes No Confinement Anxiety Claustrophobia No Tracy [name] ([id number]) Dialysis Dialysis No Any implants; medical or non-medical No Pregnancy No Diabetes if Yes for Diabetes: Non-Insulin Dependent Diabetes Medication: insulin Short acting Insulin-List: glargine Oral Hypoglycemic Agent-List: metformin, glipizide HgbA1C within 3 months Yes Seizures Seizures No Currently using these medications: Aspirin Yes Digoxin (CHF patient) No Narcotics No Nitroprusside  No Phenothiazine (Thorazine,etc.) No Prednisone or other steroids No Disulfiram (Antabuse) No Mafenide Acetate (Sulfamylon-burn cream) No Amiodarone No Date of last Chest X-ray: 06/27/2017 Date of last EKG: 08/30/2018 Date of Last CBC: 08/30/2018 Notes currently being treated for acute hearing loss, seeing [facility] for cornea problem, A1C 07/27/2018 Electronic Signature(s) Signed: 09/17/2018 5:30:25 PM By: Tracy [name] Entered By: Tracy [name] on 09/17/2018 09:20:19

## 2018-09-18 NOTE — Progress Notes (Signed)
0 No IV Access/Saline Lock 0 No Gait/Training Normal/bed rest/immobile 0 No Weak 10 Yes Impaired 0 No Mental Status Oriented to own ability 0 Yes Electronic Signature(s) Signed: 09/17/2018 5:30:25 PM By: Montey Hora Entered By: Montey Hora on 09/17/2018 09:13:23 Tracy [name], Tracy [name] ([id number]) -------------------------------------------------------------------------------- Foot Assessment Details [name]. Date of Service: 09/17/2018 8:45 AM Medical Record Number: [id number] Patient Account Number: [id number] Date of Birth/Sex: March 03, 1930 (85 y.o. M) Treating RN: Montey Hora Primary Care Shamel Galyean: Steele Sizer Other Clinician: Referring Ray Gervasi: Beverly Gust Treating Dijon Kohlman/Extender: Melburn Hake, HOYT Weeks in Treatment: 0 Foot Assessment Items Site Locations + = Sensation present, - = Sensation absent, C = Callus, U = Ulcer R = Redness, W = Warmth, M = Maceration, PU = Pre-ulcerative lesion F = Fissure, S = Swelling, D = Dryness Assessment Right: Left: Other Deformity: No No Prior Foot Ulcer: No No Prior Amputation: No No Charcot Joint: No No Ambulatory Status: Ambulatory Without Help Gait:  Steady Electronic Signature(s) Signed: 09/17/2018 5:30:25 PM By: Montey Hora Entered By: Montey Hora on 09/17/2018 09:13:38 Tracy [name], Tracy [name] ([id number]) -------------------------------------------------------------------------------- Nutrition Risk Assessment Details [name]. Date of Service: 09/17/2018 8:45 AM Medical Record Number: [id number] Patient Account Number: [id number] Date of Birth/Sex: 02-06-1930 (85 y.o. M) Treating RN: Montey Hora Primary Care Jaquise Faux: Steele Sizer Other Clinician: Referring Alline Pio: Beverly Gust Treating Naheem Mosco/Extender: Melburn Hake, HOYT Weeks in Treatment: 0 Height (in): 70 Weight (lbs): 179.7 Body Mass Index (BMI): 25.8 Nutrition Risk Assessment Items NUTRITION RISK SCREEN: I have an illness or condition that made me change the kind and/or amount of 0 No food I eat I eat fewer than two meals per day 0 No I eat few fruits and vegetables, or milk products 0 No I have three or more drinks of beer, liquor or wine almost every day 0 No I have tooth or mouth problems that make it hard for me to eat 0 No I don't always have enough money to buy the food I need 0 No I eat alone most of the time 0 No I take three or more different prescribed or over-the-counter drugs a day 1 Yes Without wanting to, I have lost or gained 10 pounds in the last six months 0 No I am not always physically able to shop, cook and/or feed myself 0 No Nutrition Protocols Good Risk Protocol 0 No interventions needed Moderate Risk Protocol Electronic Signature(s) Signed: 09/17/2018 5:30:25 PM By: Montey Hora Entered By: Montey Hora on 09/17/2018 09:13:30  Tracy [name], Tracy [name] ([id number]) Visit Report for 09/17/2018 Abuse/Suicide Risk Screen Details [name], Tracy [name]. Date of Service: 09/17/2018 8:45 AM Medical Record Number: [id number] Patient Account Number: [id number] Date of Birth/Sex: 1930/01/21 (85 y.o. M) Treating RN: Montey Hora Primary Care Anntonette Madewell: Steele Sizer Other Clinician: Referring Delmar Arriaga: Beverly Gust Treating Dashia Caldeira/Extender: Melburn Hake, HOYT Weeks in Treatment: 0 Abuse/Suicide Risk Screen Items Answer ABUSE/SUICIDE RISK SCREEN: Has anyone close to you tried to hurt or harm you recentlyo No Do you feel uncomfortable with anyone in your familyo No Has anyone forced you do things that you didnot want to doo No Do you have any thoughts of harming yourselfo No Patient displays signs or symptoms of abuse and/or neglect. No Electronic Signature(s) Signed: 09/17/2018 5:30:25 PM By: Montey Hora Entered By: Montey Hora on 09/17/2018 09:11:35 Tracy [name], Tracy [name] ([id number]) -------------------------------------------------------------------------------- Activities of Daily Living Details [name]. Date of Service: 09/17/2018 8:45 AM Medical Record Number: [id number] Patient Account Number: [id number] Date of Birth/Sex: [birth year] (85 y.o. M) Treating RN: Montey Hora Primary Care Curlie Macken: Steele Sizer Other Clinician: Referring Tawanna Funk: Beverly Gust Treating Niaya Hickok/Extender: Melburn Hake, HOYT Weeks in Treatment: 0 Activities of Daily Living Items Answer Activities of Daily Living (Please select one for each item) Drive Automobile Not Able Take Medications Completely Able Use Telephone Completely Able Care for Appearance Completely Able Use Toilet Completely Able Bath / Shower Completely Able Dress Self Completely Able Feed Self Completely Able Walk Completely Able Get In / Out Bed Completely Able Housework Completely Able Prepare Meals Completely  [place] for Self Completely Able Electronic Signature(s) Signed: 09/17/2018 5:30:25 PM By: Montey Hora Entered By: Montey Hora on 09/17/2018 09:12:34 Tracy [name], Tracy [name] ([id number]) -------------------------------------------------------------------------------- Education Assessment Details [name] Date of Service: 09/17/2018 8:45 AM Medical Record Number: [id number] Patient Account Number: [id number] Date of Birth/Sex: 1930/01/07 (85 y.o. M) Treating RN: Montey Hora Primary Care Jaslyn Bansal: Steele Sizer Other Clinician: Referring Cherrell Maybee: Beverly Gust Treating Paulita Licklider/Extender: Sharalyn Ink in Treatment: 0 Primary Learner Assessed: Patient Learning Preferences/Education Level/Primary Language Learning Preference: Explanation, Demonstration Highest Education Level: College or Above Preferred Language: English Cognitive Barrier Assessment/Beliefs Language Barrier: No Translator Needed: No Memory Deficit: No Emotional Barrier: No Cultural/Religious Beliefs Affecting Medical Care: No Physical Barrier Assessment Impaired Vision: No Impaired Hearing: No Decreased Hand dexterity: No Knowledge/Comprehension Assessment Knowledge Level: Medium Comprehension Level: Medium Ability to understand written Medium instructions: Ability to understand verbal Medium instructions: Motivation Assessment Anxiety Level: Calm Cooperation: Cooperative Education Importance: Acknowledges Need Interest in Health Problems: Asks Questions Perception: Coherent Willingness to Engage in Self- Medium Management Activities: Readiness to Engage in Self- Medium Management Activities: Electronic Signature(s) Signed: 09/17/2018 5:30:25 PM By: Montey Hora Entered By: Montey Hora on 09/17/2018 09:13:05 Tracy [name], Tracy [name] ([id number]) -------------------------------------------------------------------------------- Fall  Risk Assessment Details [name] Date of Service: 09/17/2018 8:45 AM Medical Record Number: [id number] Patient Account Number: [id number] Date of Birth/Sex: 10-May-1930 (85 y.o. M) Treating RN: Montey Hora Primary Care Zaraya Delauder: Steele Sizer Other Clinician: Referring Claud Gowan: Beverly Gust Treating Jeralyn Nolden/Extender: Melburn Hake, HOYT Weeks in Treatment: 0 Fall Risk Assessment Items Have you had 2 or more falls in the last 12 monthso 0 No Have you had any fall that resulted in injury in the last 12 monthso 0 No FALL RISK ASSESSMENT: History of falling - immediate or within 3 months 0 No Secondary diagnosis 0 No Ambulatory aid None/bed rest/wheelchair/nurse 0 Yes Crutches/cane/walker 0 No Furniture

## 2018-09-20 LAB — HM DIABETES EYE EXAM

## 2018-09-21 ENCOUNTER — Encounter: Payer: Self-pay | Admitting: Family Medicine

## 2018-09-21 NOTE — Progress Notes (Addendum)
Wound Cleansing - multiple wounds INTERVENTIONS - Wound Dressings []  - Small Wound Dressing one or multiple wounds 0 []  - 0 Medium Wound Dressing one or multiple wounds []  - 0 Large Wound Dressing one or multiple wounds []  - 0 Application of Medications - injection INTERVENTIONS - Miscellaneous []  - External ear exam 0 []  - 0 Specimen Collection (cultures, biopsies, blood, body fluids, etc.) []  - 0 Specimen(s) / Culture(s) sent or taken to Lab for analysis []  - 0 Patient Transfer (multiple staff / Harrel Lemon Lift / Similar devices) []  - 0 Simple Staple / Suture removal (25 or less) []  - 0 Complex Staple / Suture removal (26 or more) []  - 0 Hypo / Hyperglycemic Management (close monitor of Blood Glucose) []  - 0 Ankle / Brachial Index (ABI) - do not check if billed separately Has the patient been seen at the hospital within the last three years: Yes Total Score: 80 Level Of Care: New/Established - Level 3 Electronic Signature(s) Signed: 09/20/2018 2:41:13 PM By: Harold Barban Entered By: Harold Barban on 09/19/2018 16:55:14 [name], Tracy [name] ([id number]) -------------------------------------------------------------------------------- Encounter Discharge Information Details [name] Date of Service: 09/17/2018 8:45 AM Medical Record Number: [id number] Patient Account Number: [id number] Date of Birth/Sex: September 23, 1929 (85 y.o. M) Treating RN: Montey Hora Primary Care Klark Vanderhoef: Steele Sizer Other Clinician: Referring Nykiah Ma: Beverly Gust Treating Ameya Kutz/Extender: Melburn Hake, HOYT Weeks in Treatment:  0 Encounter Discharge Information Items Discharge Condition: Stable Ambulatory Status: Ambulatory Discharge Destination: Home Transportation: Private Auto Accompanied By: spouse Schedule Follow-up Appointment: No Clinical Summary of Care: Electronic Signature(s) Signed: 09/18/2018 7:29:38 AM By: Montey Hora Entered By: Montey Hora on 09/18/2018 07:29:37 See, Tracy [name] ([id number]) -------------------------------------------------------------------------------- Lower Extremity Assessment Details [name] Date of Service: 09/17/2018 8:45 AM Medical Record Number: [id number] Patient Account Number: [id number] Date of Birth/Sex: 10-21-1929 (85 y.o. M) Treating RN: Montey Hora Primary Care Shemeika Starzyk: Steele Sizer Other Clinician: Referring Deantre Bourdon: Beverly Gust Treating Haylei Cobin/Extender: Melburn Hake, HOYT Weeks in Treatment: 0 Electronic Signature(s) Signed: 09/17/2018 5:30:25 PM By: Montey Hora Entered By: Montey Hora on 09/17/2018 09:03:53 [name], Tracy [name] ([id number]) -------------------------------------------------------------------------------- Multi Wound Chart Details [name] Date of Service: 09/17/2018 8:45 AM Medical Record Number: [id number] Patient Account Number: [id number] Date of Birth/Sex: 1929/10/27 (85 y.o. M) Treating RN: Harold Barban Primary Care Frederico Gerling: Steele Sizer Other Clinician: Referring Elara Cocke: Beverly Gust Treating Michelyn Scullin/Extender: Melburn Hake, HOYT Weeks in Treatment: 0 Vital Signs Height(in): 70 Pulse(bpm): 86 Weight(lbs): 179.7 Blood Pressure(mmHg): 138/84 Body Mass Index(BMI): 26 Temperature(F): 98.0 Respiratory Rate 16 (breaths/min): Wound Assessments Treatment Notes Electronic Signature(s) Signed: 09/20/2018 2:41:13 PM By: Harold Barban Entered By: Harold Barban on 09/17/2018 09:57:52 [name], Tracy [name]  ([id number]) -------------------------------------------------------------------------------- [name] [name] Date of Service: 09/17/2018 8:45 AM Medical Record Number: [id number] Patient Account Number: [id number] Date of Birth/Sex: 15-Oct-1929 (85 y.o. M) Treating RN: Harold Barban Primary Care Tanaysia Bhardwaj: Steele Sizer Other Clinician: Referring Asuka Dusseau: Beverly Gust Treating Kemani Heidel/Extender: Sharalyn Ink in Treatment: 0 Active Inactive Electronic Signature(s) Signed: 10/08/2018 5:49:11 PM By: Gretta Cool, BSN, RN, CWS, Kim RN, BSN Signed: 11/19/2018 10:53:34 AM By: Harold Barban Previous Signature: 09/20/2018 2:41:13 PM Version By: Harold Barban Entered By: Gretta Cool BSN, RN, CWS, Kim on 10/08/2018 17:49:11 [name], Tracy [name] ([id number]) -------------------------------------------------------------------------------- Pain Assessment Details [name]. Date of Service: 09/17/2018 8:45 AM Medical Record Number: [id number] Patient Account Number: [id number] Date of Birth/Sex: 09/20/1929 (85 y.o. M) Treating RN: Harold Barban  Wound Cleansing - multiple wounds INTERVENTIONS - Wound Dressings []  - Small Wound Dressing one or multiple wounds 0 []  - 0 Medium Wound Dressing one or multiple wounds []  - 0 Large Wound Dressing one or multiple wounds []  - 0 Application of Medications - injection INTERVENTIONS - Miscellaneous []  - External ear exam 0 []  - 0 Specimen Collection (cultures, biopsies, blood, body fluids, etc.) []  - 0 Specimen(s) / Culture(s) sent or taken to Lab for analysis []  - 0 Patient Transfer (multiple staff / Harrel Lemon Lift / Similar devices) []  - 0 Simple Staple / Suture removal (25 or less) []  - 0 Complex Staple / Suture removal (26 or more) []  - 0 Hypo / Hyperglycemic Management (close monitor of Blood Glucose) []  - 0 Ankle / Brachial Index (ABI) - do not check if billed separately Has the patient been seen at the hospital within the last three years: Yes Total Score: 80 Level Of Care: New/Established - Level 3 Electronic Signature(s) Signed: 09/20/2018 2:41:13 PM By: Harold Barban Entered By: Harold Barban on 09/19/2018 16:55:14 [name], Tracy [name] ([id number]) -------------------------------------------------------------------------------- Encounter Discharge Information Details [name] Date of Service: 09/17/2018 8:45 AM Medical Record Number: [id number] Patient Account Number: [id number] Date of Birth/Sex: September 23, 1929 (85 y.o. M) Treating RN: Montey Hora Primary Care Klark Vanderhoef: Steele Sizer Other Clinician: Referring Nykiah Ma: Beverly Gust Treating Ameya Kutz/Extender: Melburn Hake, HOYT Weeks in Treatment:  0 Encounter Discharge Information Items Discharge Condition: Stable Ambulatory Status: Ambulatory Discharge Destination: Home Transportation: Private Auto Accompanied By: spouse Schedule Follow-up Appointment: No Clinical Summary of Care: Electronic Signature(s) Signed: 09/18/2018 7:29:38 AM By: Montey Hora Entered By: Montey Hora on 09/18/2018 07:29:37 See, Tracy [name] ([id number]) -------------------------------------------------------------------------------- Lower Extremity Assessment Details [name] Date of Service: 09/17/2018 8:45 AM Medical Record Number: [id number] Patient Account Number: [id number] Date of Birth/Sex: 10-21-1929 (85 y.o. M) Treating RN: Montey Hora Primary Care Shemeika Starzyk: Steele Sizer Other Clinician: Referring Deantre Bourdon: Beverly Gust Treating Haylei Cobin/Extender: Melburn Hake, HOYT Weeks in Treatment: 0 Electronic Signature(s) Signed: 09/17/2018 5:30:25 PM By: Montey Hora Entered By: Montey Hora on 09/17/2018 09:03:53 [name], Tracy [name] ([id number]) -------------------------------------------------------------------------------- Multi Wound Chart Details [name] Date of Service: 09/17/2018 8:45 AM Medical Record Number: [id number] Patient Account Number: [id number] Date of Birth/Sex: 1929/10/27 (85 y.o. M) Treating RN: Harold Barban Primary Care Frederico Gerling: Steele Sizer Other Clinician: Referring Elara Cocke: Beverly Gust Treating Michelyn Scullin/Extender: Melburn Hake, HOYT Weeks in Treatment: 0 Vital Signs Height(in): 70 Pulse(bpm): 86 Weight(lbs): 179.7 Blood Pressure(mmHg): 138/84 Body Mass Index(BMI): 26 Temperature(F): 98.0 Respiratory Rate 16 (breaths/min): Wound Assessments Treatment Notes Electronic Signature(s) Signed: 09/20/2018 2:41:13 PM By: Harold Barban Entered By: Harold Barban on 09/17/2018 09:57:52 [name], Tracy [name]  ([id number]) -------------------------------------------------------------------------------- [name] [name] Date of Service: 09/17/2018 8:45 AM Medical Record Number: [id number] Patient Account Number: [id number] Date of Birth/Sex: 15-Oct-1929 (85 y.o. M) Treating RN: Harold Barban Primary Care Tanaysia Bhardwaj: Steele Sizer Other Clinician: Referring Asuka Dusseau: Beverly Gust Treating Kemani Heidel/Extender: Sharalyn Ink in Treatment: 0 Active Inactive Electronic Signature(s) Signed: 10/08/2018 5:49:11 PM By: Gretta Cool, BSN, RN, CWS, Kim RN, BSN Signed: 11/19/2018 10:53:34 AM By: Harold Barban Previous Signature: 09/20/2018 2:41:13 PM Version By: Harold Barban Entered By: Gretta Cool BSN, RN, CWS, Kim on 10/08/2018 17:49:11 [name], Tracy [name] ([id number]) -------------------------------------------------------------------------------- Pain Assessment Details [name]. Date of Service: 09/17/2018 8:45 AM Medical Record Number: [id number] Patient Account Number: [id number] Date of Birth/Sex: 09/20/1929 (85 y.o. M) Treating RN: Harold Barban  PETER, KEYWORTH ([id number]) Visit Report for 09/17/2018 Allergy List Details Patient Name: CADE, DASHNER. Date of Service: 09/17/2018 8:45 AM Medical Record Number: [id number] Patient Account Number: [id number] Date of Birth/Sex: [birth year] (85 y.o. M) Treating RN: Montey Hora Primary Care Haylen Shelnutt: Steele Sizer Other Clinician: Referring Merrill Villarruel: Beverly Gust Treating Sierra Spargo/Extender: STONE III, HOYT Weeks in Treatment: 0 Allergies Active Allergies tetracycline HCl Allergy Notes Electronic Signature(s) Signed: 09/17/2018 5:30:25 PM By: Montey Hora Entered By: Montey Hora on 09/17/2018 09:04:42 Force, Tracy [name] ([id number]) -------------------------------------------------------------------------------- Arrival Information Details [name] Date of Service: 09/17/2018 8:45 AM Medical Record Number: [id number] Patient Account Number: [id number] Date of Birth/Sex: [birth year] (85 y.o. M) Treating RN: Harold Barban Primary Care Devean Skoczylas: Steele Sizer Other Clinician: Referring Tammatha Cobb: Beverly Gust Treating Gaberial Cada/Extender: Melburn Hake, HOYT Weeks in Treatment: 0 Visit Information Patient Arrived: Ambulatory Arrival Time: 08:56 Accompanied By: wife Transfer Assistance: None Patient Identification Verified: Yes Secondary Verification Process Completed: Yes Electronic Signature(s) Signed: 09/17/2018 3:59:13 PM By: Lorine Bears RCP, RRT, CHT Entered By: Lorine Bears on 09/17/2018 09:01:09 [name], Tracy [name] ([id number]) -------------------------------------------------------------------------------- Clinic Level of Care Assessment Details [name] Date of Service: 09/17/2018 8:45 AM Medical Record Number: [id number] Patient Account Number: [id number] Date of Birth/Sex: Aug 11, 1930 (85 y.o. M) Treating RN: Harold Barban Primary Care Zoejane Gaulin: Steele Sizer Other  Clinician: Referring Alyvia Derk: Beverly Gust Treating Eitan Doubleday/Extender: Melburn Hake, HOYT Weeks in Treatment: 0 Clinic Level of Care Assessment Items TOOL 2 Quantity Score []  - Use when only an EandM is performed on the INITIAL visit 0 ASSESSMENTS - Nursing Assessment / Reassessment X - General Physical Exam (combine w/ comprehensive assessment (listed just below) when 1 20 performed on new pt. evals) X- 1 25 Comprehensive Assessment (HX, ROS, Risk Assessments, Wounds Hx, etc.) ASSESSMENTS - Wound and Skin Assessment / Reassessment []  - Simple Wound Assessment / Reassessment - one wound 0 []  - 0 Complex Wound Assessment / Reassessment - multiple wounds []  - 0 Dermatologic / Skin Assessment (not related to wound area) ASSESSMENTS - Ostomy and/or Continence Assessment and Care []  - Incontinence Assessment and Management 0 []  - 0 Ostomy Care Assessment and Management (repouching, etc.) PROCESS - Coordination of Care []  - Simple Patient / Family Education for ongoing care 0 []  - 0 Complex (extensive) Patient / Family Education for ongoing care X- 1 10 Staff obtains Programmer, systems, Records, Test Results / Process Orders []  - 0 Staff telephones HHA, Nursing Homes / Clarify orders / etc []  - 0 Routine Transfer to another Facility (non-emergent condition) []  - 0 Routine Hospital Admission (non-emergent condition) X- 1 15 New Admissions / Biomedical engineer / Ordering NPWT, Apligraf, etc. []  - 0 Emergency Hospital Admission (emergent condition) X- 1 10 Simple Discharge Coordination []  - 0 Complex (extensive) Discharge Coordination PROCESS - Special Needs []  - Pediatric / Minor Patient Management 0 []  - 0 Isolation Patient Management DAEMIEN, FRONCZAK. ([id number]) []  - 0 Hearing / Language / Visual special needs []  - 0 Assessment of Community assistance (transportation, D/C planning, etc.) []  - 0 Additional assistance / Altered mentation []  - 0 Support Surface(s)  Assessment (bed, cushion, seat, etc.) INTERVENTIONS - Wound Cleansing / Measurement []  - Wound Imaging (photographs - any number of wounds) 0 []  - 0 Wound Tracing (instead of photographs) []  - 0 Simple Wound Measurement - one wound []  - 0 Complex Wound Measurement - multiple wounds []  - 0 Simple Wound Cleansing - one wound []  - 0 Complex

## 2018-09-24 ENCOUNTER — Other Ambulatory Visit: Payer: Self-pay | Admitting: Family Medicine

## 2018-09-24 DIAGNOSIS — Z794 Long term (current) use of insulin: Principal | ICD-10-CM

## 2018-09-24 DIAGNOSIS — E119 Type 2 diabetes mellitus without complications: Secondary | ICD-10-CM

## 2018-10-22 ENCOUNTER — Other Ambulatory Visit: Payer: Self-pay | Admitting: Family Medicine

## 2018-10-22 DIAGNOSIS — I10 Essential (primary) hypertension: Secondary | ICD-10-CM

## 2018-10-25 DIAGNOSIS — H8301 Labyrinthitis, right ear: Secondary | ICD-10-CM | POA: Insufficient documentation

## 2018-10-25 LAB — HEMOGLOBIN A1C: Hemoglobin A1C: 7

## 2018-10-25 LAB — BASIC METABOLIC PANEL: Glucose: 142

## 2018-11-09 ENCOUNTER — Ambulatory Visit (INDEPENDENT_AMBULATORY_CARE_PROVIDER_SITE_OTHER): Payer: Medicare HMO

## 2018-11-09 VITALS — BP 140/66 | HR 75 | Temp 97.7°F | Resp 15 | Ht 70.0 in | Wt 183.2 lb

## 2018-11-09 DIAGNOSIS — Z0000 Encounter for general adult medical examination without abnormal findings: Secondary | ICD-10-CM

## 2018-11-09 NOTE — Patient Instructions (Signed)
Tracy [name] , Thank you for taking time to come for your Medicare Wellness Visit. I appreciate your ongoing commitment to your health goals. Please review the following plan we discussed and let me know if I can assist you in the future.   Screening recommendations/referrals: Colonoscopy: no longer required Recommended yearly ophthalmology/optometry visit for glaucoma screening and checkup Recommended yearly dental visit for hygiene and checkup  Vaccinations: Influenza vaccine: done 07/26/18 Pneumococcal vaccine: done 07/07/14 Tdap vaccine: done 11/28/16 Shingles vaccine: Shingrix discussed. Please contact your pharmacy for coverage information.   Advanced directives: Advance directive discussed with you today. I have provided a copy for you to complete at home and have notarized. Once this is complete please bring a copy in to our office so we can scan it into your chart.  Conditions/risks identified: Keep up the great work!  Next appointment: Please follow up in one year for your Medicare Annual Wellness visit.    Preventive Care 35 Years and Older, Male Preventive care refers to lifestyle choices and visits with your health care provider that can promote health and wellness. What does preventive care include?  A yearly physical exam. This is also called an annual well check.  Dental exams once or twice a year.  Routine eye exams. Ask your health care provider how often you should have your eyes checked.  Personal lifestyle choices, including:  Daily care of your teeth and gums.  Regular physical activity.  Eating a healthy diet.  Avoiding tobacco and drug use.  Limiting alcohol use.  Practicing safe sex.  Taking low doses of aspirin every day.  Taking vitamin and mineral supplements as recommended by your health care provider. What happens during an annual well check? The services and screenings done by your health care provider during your annual well check will  depend on your age, overall health, lifestyle risk factors, and family history of disease. Counseling  Your health care provider may ask you questions about your:  Alcohol use.  Tobacco use.  Drug use.  Emotional well-being.  Home and relationship well-being.  Sexual activity.  Eating habits.  History of falls.  Memory and ability to understand (cognition).  Work and work Statistician. Screening  You may have the following tests or measurements:  Height, weight, and BMI.  Blood pressure.  Lipid and cholesterol levels. These may be checked every 5 years, or more frequently if you are over 29 years old.  Skin check.  Lung cancer screening. You may have this screening every year starting at age 64 if you have a 30-pack-year history of smoking and currently smoke or have quit within the past 15 years.  Fecal occult blood test (FOBT) of the stool. You may have this test every year starting at age 89.  Flexible sigmoidoscopy or colonoscopy. You may have a sigmoidoscopy every 5 years or a colonoscopy every 10 years starting at age 84.  Prostate cancer screening. Recommendations will vary depending on your family history and other risks.  Hepatitis C blood test.  Hepatitis B blood test.  Sexually transmitted disease (STD) testing.  Diabetes screening. This is done by checking your blood sugar (glucose) after you have not eaten for a while (fasting). You may have this done every 1-3 years.  Abdominal aortic aneurysm (AAA) screening. You may need this if you are a current or former smoker.  Osteoporosis. You may be screened starting at age 53 if you are at high risk. Talk with your health care provider about your  test results, treatment options, and if necessary, the need for more tests. Vaccines  Your health care provider may recommend certain vaccines, such as:  Influenza vaccine. This is recommended every year.  Tetanus, diphtheria, and acellular pertussis (Tdap,  Td) vaccine. You may need a Td booster every 10 years.  Zoster vaccine. You may need this after age 43.  Pneumococcal 13-valent conjugate (PCV13) vaccine. One dose is recommended after age 7.  Pneumococcal polysaccharide (PPSV23) vaccine. One dose is recommended after age 70. Talk to your health care provider about which screenings and vaccines you need and how often you need them. This information is not intended to replace advice given to you by your health care provider. Make sure you discuss any questions you have with your health care provider. Document Released: 09/25/2015 Document Revised: 05/18/2016 Document Reviewed: 06/30/2015 Elsevier Interactive Patient Education  2017 [place] Prevention in the Home Falls can cause injuries. They can happen to people of all ages. There are many things you can do to make your home safe and to help prevent falls. What can I do on the outside of my home?  Regularly fix the edges of walkways and driveways and fix any cracks.  Remove anything that might make you trip as you walk through a door, such as a raised step or threshold.  Trim any bushes or trees on the path to your home.  Use bright outdoor lighting.  Clear any walking paths of anything that might make someone trip, such as rocks or tools.  Regularly check to see if handrails are loose or broken. Make sure that both sides of any steps have handrails.  Any raised decks and porches should have guardrails on the edges.  Have any leaves, snow, or ice cleared regularly.  Use sand or salt on walking paths during winter.  Clean up any spills in your garage right away. This includes oil or grease spills. What can I do in the bathroom?  Use night lights.  Install grab bars by the toilet and in the tub and shower. Do not use towel bars as grab bars.  Use non-skid mats or decals in the tub or shower.  If you need to sit down in the shower, use a plastic, non-slip  stool.  Keep the floor dry. Clean up any water that spills on the floor as soon as it happens.  Remove soap buildup in the tub or shower regularly.  Attach bath mats securely with double-sided non-slip rug tape.  Do not have throw rugs and other things on the floor that can make you trip. What can I do in the bedroom?  Use night lights.  Make sure that you have a light by your bed that is easy to reach.  Do not use any sheets or blankets that are too big for your bed. They should not hang down onto the floor.  Have a firm chair that has side arms. You can use this for support while you get dressed.  Do not have throw rugs and other things on the floor that can make you trip. What can I do in the kitchen?  Clean up any spills right away.  Avoid walking on wet floors.  Keep items that you use a lot in easy-to-reach places.  If you need to reach something above you, use a strong step stool that has a grab bar.  Keep electrical cords out of the way.  Do not use floor polish or wax that  makes floors slippery. If you must use wax, use non-skid floor wax.  Do not have throw rugs and other things on the floor that can make you trip. What can I do with my stairs?  Do not leave any items on the stairs.  Make sure that there are handrails on both sides of the stairs and use them. Fix handrails that are broken or loose. Make sure that handrails are as long as the stairways.  Check any carpeting to make sure that it is firmly attached to the stairs. Fix any carpet that is loose or worn.  Avoid having throw rugs at the top or bottom of the stairs. If you do have throw rugs, attach them to the floor with carpet tape.  Make sure that you have a light switch at the top of the stairs and the bottom of the stairs. If you do not have them, ask someone to add them for you. What else can I do to help prevent falls?  Wear shoes that:  Do not have high heels.  Have rubber bottoms.  Are  comfortable and fit you well.  Are closed at the toe. Do not wear sandals.  If you use a stepladder:  Make sure that it is fully opened. Do not climb a closed stepladder.  Make sure that both sides of the stepladder are locked into place.  Ask someone to hold it for you, if possible.  Clearly mark and make sure that you can see:  Any grab bars or handrails.  First and last steps.  Where the edge of each step is.  Use tools that help you move around (mobility aids) if they are needed. These include:  Canes.  Walkers.  Scooters.  Crutches.  Turn on the lights when you go into a dark area. Replace any light bulbs as soon as they burn out.  Set up your furniture so you have a clear path. Avoid moving your furniture around.  If any of your floors are uneven, fix them.  If there are any pets around you, be aware of where they are.  Review your medicines with your doctor. Some medicines can make you feel dizzy. This can increase your chance of falling. Ask your doctor what other things that you can do to help prevent falls. This information is not intended to replace advice given to you by your health care provider. Make sure you discuss any questions you have with your health care provider. Document Released: 06/25/2009 Document Revised: 02/04/2016 Document Reviewed: 10/03/2014 Elsevier Interactive Patient Education  2017 [employer].

## 2018-11-09 NOTE — Progress Notes (Signed)
Subjective:   Tracy [name] is a 85 y.o. male who presents for Medicare Annual/Subsequent preventive examination.  Review of Systems:   Cardiac Risk Factors include: advanced age (>75mn, >>49women);diabetes mellitus;dyslipidemia;male gender;hypertension     Objective:    Vitals: BP 140/66 (BP Location: Right Arm, Patient Position: Sitting, Cuff Size: Normal)   Pulse 75   Temp 97.7 F (36.5 C) (Oral)   Resp 15   Ht 5' 10"  (1.778 m)   Wt 183 lb 3.2 oz (83.1 kg)   SpO2 96%   BMI 26.29 kg/m   Body mass index is 26.29 kg/m.  Advanced Directives 11/09/2018 08/13/2018 11/07/2017 09/19/2017 09/13/2017 09/07/2017 06/27/2017  Does Patient Have a Medical Advance Directive? Yes Yes Yes Yes Yes Yes Yes  Type of AParamedicof AD[place]Living will [place] will [place] will Living will Living will;Healthcare Power of [place]id[place]  Does patient want to make changes to medical advance directive? - - - - No - Patient declined - -  Copy of [place] Chart? No - copy requested - No - copy requested - No - copy requested No - copy requested -  Would patient like information on creating a medical advance directive? - - - - - - -    Tobacco Social History   Tobacco Use  Smoking Status Former Smoker  . Packs/day: 2.00  . Years: 13.00  . Pack years: 26.00  . Types: Cigarettes  . Last attempt to quit: 1963  . Years since quitting: 57.1  Smokeless Tobacco Never Used  Tobacco Comment   smoking cessation materials not required     Counseling given: Not Answered Comment: smoking cessation materials not required   Clinical Intake:  Pre-visit preparation completed: Yes  Pain : No/denies pain     BMI - recorded: 26.29 Nutritional Status: BMI 25 -29 Overweight Nutritional Risks: None Diabetes: Yes CBG done?: No Did pt. bring in CBG monitor  from home?: No   Nutrition Risk Assessment:  Has the patient had any N/V/D within the last 2 months?  No  Does the patient have any non-healing wounds?  No  Has the patient had any unintentional weight loss or weight gain?  No   Diabetes:  Is the patient diabetic?  Yes  If diabetic, was a CBG obtained today?  No  Did the patient bring in their glucometer from home?  No  How often do you monitor your CBG's? daily.   Financial Strains and Diabetes Management:  Are you having any financial strains with the device, your supplies or your medication? No .  Does the patient want to be seen by Chronic Care Management for management of their diabetes?  No  Would the patient like to be referred to a Nutritionist or for Diabetic Management?  No   Diabetic Exams:  Diabetic Eye Exam: Completed 09/20/18 negative retinopathy.   Diabetic Foot Exam: Completed 07/26/18.   How often do you need to have someone help you when you read instructions, pamphlets, or other written materials from your doctor or pharmacy?: 1 - Never What is the last grade level you completed in school?: bachelors degree  Interpreter Needed?: No  Information entered by :: KClemetine MarkerLPN  Past Medical History:  Diagnosis Date  . Arthritis   . [place]    SKIN CANCER melanoma 2 sites and multiple squamous cell  . Diabetes mellitus without  complication ([place])   . GERD (gastroesophageal reflux disease)   . Glaucoma   . Hyperlipidemia   . Hypertension   . Myocardial infarction ([place])    08/27/1979  . Vertigo    Past Surgical History:  Procedure Laterality Date  . CATARACT EXTRACTION W/ INTRAOCULAR LENS  IMPLANT, BILATERAL    . CIRCUMCISION    . GANGLION CYST EXCISION     LEFT HAND  . LUMBAR LAMINECTOMY/DECOMPRESSION MICRODISCECTOMY  09/13/2017   Procedure: BILATERAL Lumbar three-four , Left  Lumbar four-five  AND Left  L5-S1, LAMINECTOMY/FORAMINOTOMY;  Surgeon: [name], MD;  Location: [facility];  Service:  Neurosurgery;;  . MELANOMA EXCISION  07/2011  . TONSILLECTOMY    . VASECTOMY     Family History  Problem Relation Age of Onset  . Heart disease Mother   . Diabetes Mother        type 2  . Angina Mother   . Heart disease Brother   . Pneumonia Father   . Heart attack Father   . Alcoholism Daughter   . Cirrhosis Daughter    Social History   Socioeconomic History  . Marital status: Married    Spouse name: [name]  . Number of children: 4  . Years of education: Not on file  . Highest education level: Bachelor's degree (e.g., BA, AB, BS)  Occupational History  . Occupation: Retired  Scientific laboratory technician  . Financial resource strain: Not hard at all  . Food insecurity:    Worry: Never true    Inability: Never true  . Transportation needs:    Medical: No    Non-medical: No  Tobacco Use  . Smoking status: Former Smoker    Packs/day: 2.00    Years: 13.00    Pack years: 26.00    Types: Cigarettes    Last attempt to quit: 1963    Years since quitting: 57.1  . Smokeless tobacco: Never Used  . Tobacco comment: smoking cessation materials not required  Substance and Sexual Activity  . Alcohol use: Yes    Alcohol/week: 2.0 standard drinks    Types: 2 Shots of liquor per week    Comment: occasional  . Drug use: No  . Sexual activity: Not Currently  Lifestyle  . Physical activity:    Days per week: 3 days    Minutes per session: 40 min  . Stress: Not at all  Relationships  . Social connections:    Talks on phone: More than three times a week    Gets together: Three times a week    Attends religious service: Never    Active member of club or organization: Yes    Attends meetings of clubs or organizations: 1 to 4 times per year    Relationship status: Married  Other Topics Concern  . Not on file  Social History Narrative   Daughter Manuela Schwartz passed away October 13, 2018.    Outpatient Encounter Medications as of 11/09/2018  Medication Sig  . ACCU-CHEK FASTCLIX LANCETS MISC CHECK  BLOOD   SUGAR TWICE DAILY  . allopurinol (ZYLOPRIM) 100 MG tablet Take 1 tablet (100 mg total) by mouth 2 (two) times daily.  Marland Kitchen amLODipine (NORVASC) 5 MG tablet Take 2 tablets (10 mg total) by mouth daily.  Marland Kitchen aspirin 81 MG tablet Take 81 mg by mouth.  Marland Kitchen atorvastatin (LIPITOR) 10 MG tablet TAKE 1 TABLET DAILY AT 6 PM.  . Blood Glucose Monitoring Suppl (ACCU-CHEK NANO SMARTVIEW) W/DEVICE KIT USE AS DIRECTED  . Cholecalciferol (VITAMIN D)  2000 units tablet Take 2,000 Units by mouth daily.  . dorzolamide (TRUSOPT) 2 % ophthalmic solution Place 1 drop into the left eye 2 (two) times daily.   Marland Kitchen doxazosin (CARDURA) 4 MG tablet Take 1 tablet (4 mg total) by mouth at bedtime.  . famotidine (PEPCID) 20 MG tablet Take 1 tablet (20 mg total) by mouth daily as needed for heartburn or indigestion. In place of Ranitidine  . finasteride (PROSCAR) 5 MG tablet Take 1 tablet (5 mg total) by mouth at bedtime.  Marland Kitchen glipiZIDE (GLUCOTROL) 10 MG tablet Take 1 tablet (10 mg total) by mouth 2 (two) times daily before a meal.  . glucose blood (COOL BLOOD GLUCOSE TEST STRIPS) test strip Use as instructed  . Insulin Pen Needle (B-D ULTRAFINE III SHORT PEN) 31G X 8 MM MISC USE AS DIRECTED WITH INSULIN PEN.  . LANTUS SOLOSTAR 100 UNIT/ML Solostar Pen INJECT 48 UNITS INTO THE SKIN AT BEDTIME. (Patient taking differently: Pt taking 50 units QHS)  . lisinopril (PRINIVIL,ZESTRIL) 40 MG tablet Take 1 tablet (40 mg total) by mouth daily.  . metFORMIN (GLUCOPHAGE) 1000 MG tablet Take 1 tablet (1,000 mg total) by mouth 2 (two) times daily with a meal.  . Multiple Vitamins-Minerals (CENTRUM SILVER 50+MEN) TABS Take 1 tablet by mouth daily.  Marland Kitchen NEEDLE, DISP, 14 G 14G X 1" MISC   . omega-3 fish oil (MAXEPA) 1000 MG CAPS capsule Take 2 capsules by mouth daily.   . timolol (TIMOPTIC) 0.5 % ophthalmic solution Place 1 drop into the left eye 2 (two) times daily.   . vitamin B-12 (CYANOCOBALAMIN) 500 MCG tablet Take 1 tablet by mouth daily.  .  fluticasone (FLONASE) 50 MCG/ACT nasal spray Place 1 spray into both nostrils daily. (Patient not taking: Reported on 11/09/2018)  . levocetirizine (XYZAL) 5 MG tablet Take 1 tablet (5 mg total) by mouth every evening. (Patient not taking: Reported on 11/09/2018)  . meclizine (ANTIVERT) 25 MG tablet Take 1 tablet (25 mg total) by mouth 3 (three) times daily as needed for dizziness. (Patient not taking: Reported on 11/09/2018)  . NOVOLOG FLEXPEN 100 UNIT/ML FlexPen PLEASE GIVE BEFORE EACH MEAL ACCORDING TO SLIDING SCALE. BG 151 200 GIVE 2 UNITS BG 201 250 GIVE 4 UNITS BG 251 300 GIVE 6 UNITS BG 300 AND   No facility-administered encounter medications on file as of 11/09/2018.     Activities of Daily Living In your present state of health, do you have any difficulty performing the following activities: 11/09/2018 08/24/2018  Hearing? Tempie Donning  Comment wears hearing aids -  Vision? N N  Comment wears glasses -  Difficulty concentrating or making decisions? Y N  Comment remembering names -  Walking or climbing stairs? N N  Dressing or bathing? N N  Doing errands, shopping? N N  Preparing Food and eating ? N -  Using the Toilet? N -  In the past six months, have you accidently leaked urine? Y -  Comment occaisonally -  Do you have problems with loss of bowel control? N -  Managing your Medications? N -  Managing your Finances? N -  Housekeeping or managing your Housekeeping? N -  Some recent data might be hidden    Patient Care Team: Steele Sizer, MD as PCP - General (Family Medicine) Dasher, Rayvon Char, MD (Dermatology) Abbie Sons, MD as Consulting Physician (Urology) [name], MD as Consulting Physician (Neurosurgery) Dorisann Frames, MD as Referring Physician (Endocrinology)   Assessment:  This is a routine wellness examination for [facility].  Exercise Activities and Dietary recommendations Current Exercise Habits: Structured exercise class, Time (Minutes): 40, Frequency  (Times/Week): 3, Weekly Exercise (Minutes/Week): 120, Intensity: Moderate, Exercise limited by: cardiac condition(s);orthopedic condition(s)  Goals    . DIET - INCREASE WATER INTAKE     Recommend to drink at least 6-8 8oz glasses of water per day.    . Increase water intake     Recommend increasing water intake to 3-4 glasses a day.       Fall Risk Fall Risk  11/09/2018 08/24/2018 08/13/2018 07/26/2018 01/22/2018  Falls in the past year? 0 0 0 0 No  Number falls in past yr: 0 0 - 0 -  Injury with Fall? 0 0 - 0 -  Risk for fall due to : - - - - -  Risk for fall due to: Comment - - - - -  Follow up Falls prevention discussed - - - -   FALL RISK PREVENTION PERTAINING TO THE HOME:  Any stairs in or around the home? Yes  If so, do they handrails? Yes   Home free of loose throw rugs in walkways, pet beds, electrical cords, etc? Yes  Adequate lighting in your home to reduce risk of falls? Yes   ASSISTIVE DEVICES UTILIZED TO PREVENT FALLS:  Life alert? No  Use of a cane, walker or w/c? No  Grab bars in the bathroom? Yes  Shower chair or bench in shower? No  Elevated toilet seat or a handicapped toilet? No   DME ORDERS:  DME order needed?  No   TIMED UP AND GO:  Was the test performed? Yes .  Length of time to ambulate 10 feet: 5 sec.   GAIT:  Appearance of gait: Gait stead-fast and without the use of an assistive device.   Education: Fall risk prevention has been discussed.  Intervention(s) required? No   Depression Screen PHQ 2/9 Scores 11/09/2018 08/24/2018 08/13/2018 07/26/2018  PHQ - 2 Score 0 0 0 0  PHQ- 9 Score 1 0 - 3    Cognitive Function     6CIT Screen 11/09/2018 11/07/2017 11/28/2016  What Year? 0 points 0 points 0 points  What month? 0 points 0 points 0 points  What time? 0 points 0 points 0 points  Count back from 20 0 points 0 points 0 points  Months in reverse 0 points 0 points 0 points  Repeat phrase 0 points 2 points 0 points  Total Score 0 2 0     Immunization History  Administered Date(s) Administered  . Influenza, High Dose Seasonal PF 07/07/2014, 07/28/2016, 06/27/2017, 07/26/2018  . Influenza, Seasonal, Injecte, Preservative Fre 07/16/2012  . Influenza,inj,Quad PF,6+ Mos 06/18/2013, 06/30/2015  . Influenza-Unspecified 07/16/2015  . Pneumococcal Conjugate-13 07/07/2014  . Pneumococcal Polysaccharide-23 07/26/2010  . Tdap 11/28/2016    Qualifies for Shingles Vaccine? Yes  Zostavax completed per patient. Due for Shingrix. Education has been provided regarding the importance of this vaccine. Pt has been advised to call insurance company to determine out of pocket expense. Advised may also receive vaccine at local pharmacy or Health Dept. Verbalized acceptance and understanding.  Tdap: Up to date  Flu Vaccine: Up to date  Pneumococcal Vaccine: Up to date   Screening Tests Health Maintenance  Topic Date Due  . HEMOGLOBIN A1C  01/24/2019  . FOOT EXAM  07/27/2019  . OPHTHALMOLOGY EXAM  09/21/2019  . TETANUS/TDAP  11/29/2026  . INFLUENZA VACCINE  Completed  .  PNA vac Low Risk Adult  Completed   Cancer Screenings:  Colorectal Screening: No longer required.   Lung Cancer Screening: (Low Dose CT Chest recommended if Age 22-80 years, 30 pack-year currently smoking OR have quit w/in 15years.) does not qualify.   Additional Screening:  Hepatitis C Screening: no longer required  Vision Screening: Recommended annual ophthalmology exams for early detection of glaucoma and other disorders of the eye. Is the patient up to date with their annual eye exam?  Yes  Who is the provider or what is the name of the office in which the pt attends annual eye exams? [place] Screening: Recommended annual dental exams for proper oral hygiene  Community Resource Referral:  CRR required this visit?  No        Plan:    I have personally reviewed and addressed the Medicare Annual Wellness questionnaire and have  noted the following in the patient's chart:  A. Medical and social history B. Use of alcohol, tobacco or illicit drugs  C. Current medications and supplements D. Functional ability and status E.  Nutritional status F.  Physical activity G. Advance directives H. List of other physicians I.  Hospitalizations, surgeries, and ER visits in previous 12 months J.  [place] such as hearing and vision if needed, cognitive and depression L. Referrals and appointments   In addition, I have reviewed and discussed with patient certain preventive protocols, quality metrics, and best practice recommendations. A written personalized care plan for preventive services as well as general preventive health recommendations were provided to patient.   Signed,  Clemetine Marker, LPN Nurse Health Advisor   Nurse Notes: pt doing well and appreciative of visit today.

## 2018-11-13 ENCOUNTER — Other Ambulatory Visit: Payer: Self-pay | Admitting: Family Medicine

## 2018-11-13 DIAGNOSIS — I10 Essential (primary) hypertension: Secondary | ICD-10-CM

## 2018-11-26 ENCOUNTER — Ambulatory Visit (INDEPENDENT_AMBULATORY_CARE_PROVIDER_SITE_OTHER): Payer: Medicare HMO | Admitting: Family Medicine

## 2018-11-26 ENCOUNTER — Encounter: Payer: Self-pay | Admitting: Family Medicine

## 2018-11-26 ENCOUNTER — Other Ambulatory Visit: Payer: Self-pay

## 2018-11-26 VITALS — BP 138/62 | HR 88 | Temp 98.2°F | Resp 16 | Ht 70.0 in | Wt 184.3 lb

## 2018-11-26 DIAGNOSIS — H9121 Sudden idiopathic hearing loss, right ear: Secondary | ICD-10-CM | POA: Diagnosis not present

## 2018-11-26 DIAGNOSIS — E785 Hyperlipidemia, unspecified: Secondary | ICD-10-CM

## 2018-11-26 DIAGNOSIS — R351 Nocturia: Secondary | ICD-10-CM

## 2018-11-26 DIAGNOSIS — I10 Essential (primary) hypertension: Secondary | ICD-10-CM | POA: Diagnosis not present

## 2018-11-26 DIAGNOSIS — I252 Old myocardial infarction: Secondary | ICD-10-CM

## 2018-11-26 DIAGNOSIS — N401 Enlarged prostate with lower urinary tract symptoms: Secondary | ICD-10-CM

## 2018-11-26 DIAGNOSIS — D696 Thrombocytopenia, unspecified: Secondary | ICD-10-CM

## 2018-11-26 DIAGNOSIS — Z794 Long term (current) use of insulin: Secondary | ICD-10-CM

## 2018-11-26 DIAGNOSIS — E1129 Type 2 diabetes mellitus with other diabetic kidney complication: Secondary | ICD-10-CM

## 2018-11-26 DIAGNOSIS — N400 Enlarged prostate without lower urinary tract symptoms: Secondary | ICD-10-CM

## 2018-11-26 DIAGNOSIS — K219 Gastro-esophageal reflux disease without esophagitis: Secondary | ICD-10-CM

## 2018-11-26 DIAGNOSIS — H9191 Unspecified hearing loss, right ear: Secondary | ICD-10-CM | POA: Insufficient documentation

## 2018-11-26 DIAGNOSIS — R809 Proteinuria, unspecified: Secondary | ICD-10-CM

## 2018-11-26 LAB — CBC WITH DIFFERENTIAL/PLATELET
Absolute Monocytes: 586 cells/uL (ref 200–950)
BASOS ABS: 52 {cells}/uL (ref 0–200)
Basophils Relative: 0.9 %
Eosinophils Absolute: 441 cells/uL (ref 15–500)
Eosinophils Relative: 7.6 %
HCT: 37.2 % — ABNORMAL LOW (ref 38.5–50.0)
Hemoglobin: 12.7 g/dL — ABNORMAL LOW (ref 13.2–17.1)
Lymphs Abs: 1821 cells/uL (ref 850–3900)
MCH: 33.4 pg — AB (ref 27.0–33.0)
MCHC: 34.1 g/dL (ref 32.0–36.0)
MCV: 97.9 fL (ref 80.0–100.0)
MPV: 12 fL (ref 7.5–12.5)
Monocytes Relative: 10.1 %
NEUTROS PCT: 50 %
Neutro Abs: 2900 cells/uL (ref 1500–7800)
Platelets: 144 10*3/uL (ref 140–400)
RBC: 3.8 10*6/uL — ABNORMAL LOW (ref 4.20–5.80)
RDW: 13.6 % (ref 11.0–15.0)
Total Lymphocyte: 31.4 %
WBC: 5.8 10*3/uL (ref 3.8–10.8)

## 2018-11-26 NOTE — Patient Instructions (Signed)
Coronavirus (COVID-19) Are you at risk?  Are you at risk for the Coronavirus (COVID-19)?  To be considered HIGH RISK for Coronavirus (COVID-19), you have to meet the following criteria:  . Traveled to [place], [place], [place], [place] or [place]; or in the [place] to [place], [place], [place], or [place]; and have fever, cough, and shortness of breath within the last 2 weeks of travel OR . Been in close contact with a person diagnosed with COVID-19 within the last 2 weeks and have fever, cough, and shortness of breath . IF YOU DO NOT MEET THESE CRITERIA, YOU ARE CONSIDERED LOW RISK FOR COVID-19.  What to do if you are HIGH RISK for COVID-19?  . If you are having a medical emergency, call 911. . Seek medical care right away. Before you go to a doctor's office, urgent care or emergency department, call ahead and tell them about your recent travel, contact with someone diagnosed with COVID-19, and your symptoms. You should receive instructions from your physician's office regarding next steps of care.  . When you arrive at healthcare provider, tell the healthcare staff immediately you have returned from visiting [place], [place], [place], [place] or [place]; or traveled in the [place] to [place], [place], [place], or [place]; in the last two weeks or you have been in close contact with a person diagnosed with COVID-19 in the last 2 weeks.   . Tell the health care staff about your symptoms: fever, cough and shortness of breath. . After you have been seen by a medical provider, you will be either: o Tested for (COVID-19) and discharged home on quarantine except to seek medical care if symptoms worsen, and asked to  - Stay home and avoid contact with others until you get your results (4-5 days)  - Avoid travel on public transportation if possible (such as bus, train, or airplane) or o Sent to the Emergency Department by EMS for evaluation, COVID-19 testing, and possible  admission depending on your condition and test results.  What to do if you are LOW RISK for COVID-19?  Reduce your risk of any infection by using the same precautions used for avoiding the common cold or flu:  . Wash your hands often with soap and warm water for at least 20 seconds.  If soap and water are not readily available, use an alcohol-based hand sanitizer with at least 60% alcohol.  . If coughing or sneezing, cover your mouth and nose by coughing or sneezing into the elbow areas of your shirt or coat, into a tissue or into your sleeve (not your hands). . Avoid shaking hands with others and consider head nods or verbal greetings only. . Avoid touching your eyes, nose, or mouth with unwashed hands.  . Avoid close contact with people who are sick. . Avoid places or events with large numbers of people in one location, like concerts or sporting events. . Carefully consider travel plans you have or are making. . If you are planning any travel outside or inside the [place], visit the CDC's Travelers' Health webpage for the latest health notices. . If you have some symptoms but not all symptoms, continue to monitor at home and seek medical attention if your symptoms worsen. . If you are having a medical emergency, call 911.   ADDITIONAL HEALTHCARE OPTIONS FOR PATIENTS  [facility] / e-Visit: [email or web address]         [facility]: [phone]  [facility]: [phone]                   [facility]: [phone]   

## 2018-11-26 NOTE — Progress Notes (Signed)
Name: Tracy [name]   MRN: [id number]    DOB: 1929/11/09   Date:11/26/2018       Progress Note  Subjective  Chief Complaint  Chief Complaint  Patient presents with  . Medication Refill    Dr. Marygrace Drought had dizziness seen ENT and had a virus that caused him to become deaf in right ear-August 27, 2018  . Hypertension  . Diabetes    Dr. Kenton Kingfisher at [facility]  . Hyperlipidemia  . 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 He has been doing well since, denies chest pain, SOB or orthopnea. He denies side effects of medication Unchanged   DMII: he sees Dr. Kenton Kingfisher at [facility]. He had glipizide decreased by NP, but glucose went up and he went back to Endocrinologist and is back on Glipizide , lantus and metformin, he was on prednisone and was given Novolog prn only, Feb hbA1C was 7% and is doing well now. Denies polyphagia, polyuria or polyphagia. Urine micro is up to date. Glucose now is back to normal range between 90-125   Hyperlipidemia: taking medication and denies side effects, LDL is at goal. No myalgia.  Last LDL 27, continue medications  GERD: symptoms severe last Summer but doing well now, occasionally takes Ranitidine very seldom now.    HTN: under control with medication, no chest pain or decrease in exercise tolerance. Still golfing twice a week.   Thrombocytopenia: stable, we will recheck labs today, also had some anemia with last lab work, denies easy bruising  Hearing loss sudden, happened after an URI, seen by Dr. Tami Ribas and had steroid and is using a hearing aid. Had PT and took prednisone, stable now   History of lumbar surgery : doing well , no pain at this time   Patient Active Problem List   Diagnosis Date Noted  . Labyrinthitis of right ear 10/25/2018  . Spinal stenosis of lumbar region 09/13/2017  . Spinal stenosis of lumbar region with neurogenic claudication  05/03/2017  . Type 2 diabetes mellitus without complication, with long-term current use of insulin ([place]) 05/03/2017  . Hyperkalemia 04/18/2017  . Left hip pain 07/28/2016  . ALT (SGPT) level raised 07/13/2015  . At risk for falling 07/13/2015  . Arthritis of hand, degenerative 07/13/2015  . Gout 07/13/2015  . Glaucoma 07/13/2015  . Diabetic neuropathy ([place]) 07/13/2015  . Hypertension 04/09/2015  . Type 2 diabetes mellitus with microalbuminuria, with long-term current use of insulin ([place]) 04/09/2015  . Dyslipidemia 04/09/2015  . Hypertriglyceridemia 04/09/2015  . Benign fibroma of prostate 04/09/2015  . Acid reflux 04/09/2015  . History of melanoma excision 04/09/2015  . History of squamous cell carcinoma 05/23/2013  . H/O adenomatous polyp of colon 08/25/2010  . History of MI (myocardial infarction) 08/27/1979    Past Surgical History:  Procedure Laterality Date  . CATARACT EXTRACTION W/ INTRAOCULAR LENS  IMPLANT, BILATERAL    . CIRCUMCISION    . GANGLION CYST EXCISION     LEFT HAND  . LUMBAR LAMINECTOMY/DECOMPRESSION MICRODISCECTOMY  09/13/2017   Procedure: BILATERAL Lumbar three-four , Left  Lumbar four-five  AND Left  L5-S1, LAMINECTOMY/FORAMINOTOMY;  Surgeon: [name], MD;  Location: [place];  Service: Neurosurgery;;  . MELANOMA EXCISION  07/2011  . TONSILLECTOMY    . VASECTOMY      Family History  Problem Relation Age of Onset  . Heart disease Mother   . Diabetes  Mother        type 2  . Angina Mother   . Heart disease Brother   . Pneumonia Father   . Heart attack Father   . Alcoholism Daughter   . Cirrhosis Daughter     Social History   Socioeconomic History  . Marital status: Married    Spouse name: [name]  . Number of children: 4  . Years of education: Not on file  . Highest education level: Bachelor's degree (e.g., BA, AB, BS)  Occupational History  . Occupation: Retired  Scientific laboratory technician  . Financial resource strain: Not hard at all  . Food insecurity:     Worry: Never true    Inability: Never true  . Transportation needs:    Medical: No    Non-medical: No  Tobacco Use  . Smoking status: Former Smoker    Packs/day: 2.00    Years: 13.00    Pack years: 26.00    Types: Cigarettes    Last attempt to quit: 1963    Years since quitting: 57.2  . Smokeless tobacco: Never Used  . Tobacco comment: smoking cessation materials not required  Substance and Sexual Activity  . Alcohol use: Yes    Alcohol/week: 2.0 standard drinks    Types: 2 Shots of liquor per week    Comment: occasional  . Drug use: No  . Sexual activity: Not Currently  Lifestyle  . Physical activity:    Days per week: 3 days    Minutes per session: 40 min  . Stress: Not at all  Relationships  . Social connections:    Talks on phone: More than three times a week    Gets together: Three times a week    Attends religious service: Never    Active member of club or organization: Yes    Attends meetings of clubs or organizations: 1 to 4 times per year    Relationship status: Married  . Intimate partner violence:    Fear of current or ex partner: No    Emotionally abused: No    Physically abused: No    Forced sexual activity: No  Other Topics Concern  . Not on file  Social History Narrative   Daughter Manuela Schwartz passed away 2018/10/09.     Current Outpatient Medications:  .  ACCU-CHEK FASTCLIX LANCETS MISC, CHECK  BLOOD  SUGAR TWICE DAILY, Disp: 204 each, Rfl: 1 .  allopurinol (ZYLOPRIM) 100 MG tablet, Take 1 tablet (100 mg total) by mouth 2 (two) times daily., Disp: 180 tablet, Rfl: 1 .  amLODipine (NORVASC) 5 MG tablet, TAKE 2 TABLETS (10 MG TOTAL) BY MOUTH DAILY., Disp: 180 tablet, Rfl: 0 .  aspirin 81 MG tablet, Take 81 mg by mouth., Disp: , Rfl:  .  atorvastatin (LIPITOR) 10 MG tablet, TAKE 1 TABLET DAILY AT 6 PM., Disp: 90 tablet, Rfl: 1 .  Blood Glucose Monitoring Suppl (ACCU-CHEK NANO SMARTVIEW) W/DEVICE KIT, USE AS DIRECTED, Disp: 1 kit, Rfl: 0 .  Cholecalciferol  (VITAMIN D) 2000 units tablet, Take 2,000 Units by mouth daily., Disp: , Rfl:  .  dorzolamide (TRUSOPT) 2 % ophthalmic solution, Place 1 drop into the left eye 2 (two) times daily. , Disp: , Rfl:  .  doxazosin (CARDURA) 4 MG tablet, Take 1 tablet (4 mg total) by mouth at bedtime., Disp: 90 tablet, Rfl: 1 .  famotidine (PEPCID) 20 MG tablet, Take 1 tablet (20 mg total) by mouth daily as needed for heartburn or indigestion. In place  of Ranitidine, Disp: 90 tablet, Rfl: 1 .  finasteride (PROSCAR) 5 MG tablet, Take 1 tablet (5 mg total) by mouth at bedtime., Disp: 90 tablet, Rfl: 1 .  glipiZIDE (GLUCOTROL) 10 MG tablet, Take 1 tablet (10 mg total) by mouth 2 (two) times daily before a meal., Disp: 180 tablet, Rfl: 1 .  glucose blood (COOL BLOOD GLUCOSE TEST STRIPS) test strip, Use as instructed, Disp: 100 each, Rfl: 12 .  Insulin Pen Needle (B-D ULTRAFINE III SHORT PEN) 31G X 8 MM MISC, USE AS DIRECTED WITH INSULIN PEN., Disp: 100 each, Rfl: 5 .  LANTUS SOLOSTAR 100 UNIT/ML Solostar Pen, INJECT 48 UNITS INTO THE SKIN AT BEDTIME. (Patient taking differently: Pt taking 50 units QHS), Disp: 30 mL, Rfl: 0 .  lisinopril (PRINIVIL,ZESTRIL) 40 MG tablet, Take 1 tablet (40 mg total) by mouth daily., Disp: 90 tablet, Rfl: 1 .  metFORMIN (GLUCOPHAGE) 1000 MG tablet, Take 1 tablet (1,000 mg total) by mouth 2 (two) times daily with a meal., Disp: 180 tablet, Rfl: 1 .  Multiple Vitamins-Minerals (CENTRUM SILVER 50+MEN) TABS, Take 1 tablet by mouth daily., Disp: , Rfl:  .  NEEDLE, DISP, 14 G 14G X 1" MISC, , Disp: , Rfl:  .  NOVOLOG FLEXPEN 100 UNIT/ML FlexPen, PLEASE GIVE BEFORE EACH MEAL ACCORDING TO SLIDING SCALE. BG 151 200 GIVE 2 UNITS BG 201 250 GIVE 4 UNITS BG 251 300 GIVE 6 UNITS BG 300 AND, Disp: , Rfl:  .  omega-3 fish oil (MAXEPA) 1000 MG CAPS capsule, Take 2 capsules by mouth daily. , Disp: , Rfl:  .  timolol (TIMOPTIC) 0.5 % ophthalmic solution, Place 1 drop into the left eye 2 (two) times daily. , Disp: ,  Rfl:  .  vitamin B-12 (CYANOCOBALAMIN) 500 MCG tablet, Take 1 tablet by mouth daily., Disp: , Rfl:  .  fluticasone (FLONASE) 50 MCG/ACT nasal spray, Place 1 spray into both nostrils daily. (Patient not taking: Reported on 11/26/2018), Disp: 16 g, Rfl: 2 .  levocetirizine (XYZAL) 5 MG tablet, Take 1 tablet (5 mg total) by mouth every evening. (Patient not taking: Reported on 11/09/2018), Disp: 30 tablet, Rfl: 0 .  meclizine (ANTIVERT) 25 MG tablet, Take 1 tablet (25 mg total) by mouth 3 (three) times daily as needed for dizziness. (Patient not taking: Reported on 11/09/2018), Disp: 30 tablet, Rfl: 0  Allergies  Allergen Reactions  . Tetracyclines & Related Other (See Comments)    UNSPECIFIED REACTION  other    I personally reviewed active problem list, medication list, allergies, family history, social history with the patient/caregiver today.   ROS  Constitutional: Negative for fever or weight change.  Respiratory: Negative for cough and shortness of breath.   Cardiovascular: Negative for chest pain or palpitations.  Gastrointestinal: Negative for abdominal pain, no bowel changes.  Musculoskeletal: positive  for gait problem because of labyrinthitis but getting better, had PT , no joint swelling.  Skin: Negative for rash.  Neurological: Negative for dizziness or headache.  No other specific complaints in a complete review of systems (except as listed in HPI above).  Objective  Vitals:   11/26/18 0847  BP: 138/62  Pulse: 88  Resp: 16  Temp: 98.2 F (36.8 C)  TempSrc: Oral  SpO2: 97%  Weight: 184 lb 4.8 oz (83.6 kg)  Height: _0  (1.778 m)    Body mass index is 26.44 kg/m.  Physical Exam    Constitutional: Patient appears well-developed and well-nourished. Obese  No distress.  HEENT:  head atraumatic, normocephalic, pupils equal and reactive to light, ears hearing aid, going from left to right side,  neck supple, throat within normal limits Cardiovascular: Normal rate,  regular rhythm and normal heart sounds.  No murmur heard. No BLE edema. Pulmonary/Chest: [place]ort normal and breath sounds normal. No respiratory distress. Abdominal: Soft.  There is no tenderness. Psychiatric: Patient has a normal mood and affect. behavior is normal. Judgment and thought content normal.  Recent Results (from the past 2160 hour(s))  CBC     Status: Abnormal   Collection Time: 08/30/18  4:41 AM  Result Value Ref Range   WBC 9.2 4.0 - 10.5 K/uL   RBC 3.61 (L) 4.22 - 5.81 MIL/uL   Hemoglobin 11.9 (L) 13.0 - 17.0 g/dL   HCT 35.4 (L) 39.0 - 52.0 %   MCV 98.1 80.0 - 100.0 fL   MCH 33.0 26.0 - 34.0 pg   MCHC 33.6 30.0 - 36.0 g/dL   RDW 12.8 11.5 - 15.5 %   Platelets 125 (L) 150 - 400 K/uL   nRBC 0.0 0.0 - 0.2 %    Comment: Performed at [facility], [address] [place], [place] [address]  Comprehensive metabolic panel     Status: Abnormal   Collection Time: 08/30/18  4:41 AM  Result Value Ref Range   Sodium 139 135 - 145 mmol/L   Potassium 3.5 3.5 - 5.1 mmol/L   Chloride 108 98 - 111 mmol/L   CO2 23 22 - 32 mmol/L   Glucose, Bld 199 (H) 70 - 99 mg/dL   BUN 21 8 - 23 mg/dL   Creatinine, Ser 1.01 0.61 - 1.24 mg/dL   Calcium 8.4 (L) 8.9 - 10.3 mg/dL   Total Protein 6.4 (L) 6.5 - 8.1 g/dL   Albumin 3.6 3.5 - 5.0 g/dL   AST 23 15 - 41 U/L   ALT 33 0 - 44 U/L   Alkaline Phosphatase 45 38 - 126 U/L   Total Bilirubin 0.5 0.3 - 1.2 mg/dL   GFR calc non Af Amer >60 >60 mL/min   GFR calc Af Amer >60 >60 mL/min   Anion gap 8 5 - 15    Comment: Performed at [facility], [address] [place], [place] [address]  Troponin I - ONCE - STAT     Status: None   Collection Time: 08/30/18  4:41 AM  Result Value Ref Range   Troponin I <0.03 <0.03 ng/mL    Comment: Performed at [facility], [address] [place], [place] [address]  HM DIABETES EYE EXAM     Status: None   Collection Time: 09/20/18 12:00 AM  Result Value Ref Range   HM Diabetic  Eye Exam No Retinopathy No Retinopathy  Basic metabolic panel     Status: None   Collection Time: 10/25/18 12:00 AM  Result Value Ref Range   Glucose 142   Hemoglobin A1c     Status: None   Collection Time: 10/25/18 12:00 AM  Result Value Ref Range   Hemoglobin A1C 7.0      PHQ2/9: Depression screen [facility] 2/9 11/26/2018 11/09/2018 08/24/2018 08/13/2018 07/26/2018  Decreased Interest 0 0 0 0 0  Down, Depressed, Hopeless 0 0 0 0 0  PHQ - 2 Score 0 0 0 0 0  Altered sleeping 1 0 0 - 0  Tired, decreased energy 1 1 0 - 1  Change in appetite 0 0 0 - 0  Feeling bad or failure about  yourself  0 0 0 - 0  Trouble concentrating 1 0 0 - 1  Moving slowly or fidgety/restless 0 0 0 - 1  Suicidal thoughts 0 0 0 - 0  PHQ-9 Score 3 1 0 - 3  Difficult doing work/chores Not difficult at all Not difficult at all Not difficult at all - Not difficult at all     Fall Risk: Fall Risk  11/26/2018 11/09/2018 08/24/2018 08/13/2018 07/26/2018  Falls in the past year? 0 0 0 0 0  Number falls in past yr: 0 0 0 - 0  Injury with Fall? 0 0 0 - 0  Risk for fall due to : - - - - -  Risk for fall due to: Comment - - - - -  Follow up - Falls prevention discussed - - -     Functional Status Survey: Is the patient deaf or have difficulty hearing?: Yes(Deaf in right ear) Does the patient have difficulty seeing, even when wearing glasses/contacts?: Yes Does the patient have difficulty concentrating, remembering, or making decisions?: No Does the patient have difficulty walking or climbing stairs?: No Does the patient have difficulty dressing or bathing?: No Does the patient have difficulty doing errands alone such as visiting a doctor's office or shopping?: No   Assessment & Plan  1. Controlled type 2 diabetes mellitus with microalbuminuria, with long-term current use of insulin (HCC)  Recent A1C is at goal, off Novolog now, we will recheck for Dr. Kenton Kingfisher, since he only goes to [facility] once a year   2. Sudden  idiopathic hearing loss of right ear with unrestricted hearing of left ear  Stable and has follow up with Dr. Tami Ribas   3. Essential hypertension  Taking medication and bp is at goal   4. BPH associated with nocturia   5. Dyslipidemia  Taking statin therapy   6. Thrombocytopenia (HCC)  - CBC with Differential/Platelet  7. History of MI (myocardial infarction)  Taking statin therapy   8. Benign fibroma of prostate   9. Gastroesophageal reflux disease, esophagitis presence not specified  Doing well on prn medication

## 2018-12-03 ENCOUNTER — Other Ambulatory Visit: Payer: Self-pay | Admitting: Family Medicine

## 2018-12-03 DIAGNOSIS — E119 Type 2 diabetes mellitus without complications: Secondary | ICD-10-CM

## 2018-12-03 DIAGNOSIS — Z794 Long term (current) use of insulin: Principal | ICD-10-CM

## 2019-01-15 DIAGNOSIS — H1820 Unspecified corneal edema: Secondary | ICD-10-CM | POA: Diagnosis not present

## 2019-02-05 ENCOUNTER — Other Ambulatory Visit: Payer: Self-pay | Admitting: Family Medicine

## 2019-02-05 DIAGNOSIS — N401 Enlarged prostate with lower urinary tract symptoms: Secondary | ICD-10-CM

## 2019-02-05 DIAGNOSIS — R351 Nocturia: Secondary | ICD-10-CM

## 2019-02-05 DIAGNOSIS — I10 Essential (primary) hypertension: Secondary | ICD-10-CM

## 2019-02-08 ENCOUNTER — Telehealth: Payer: Self-pay

## 2019-02-08 DIAGNOSIS — I10 Essential (primary) hypertension: Secondary | ICD-10-CM

## 2019-02-08 DIAGNOSIS — N401 Enlarged prostate with lower urinary tract symptoms: Secondary | ICD-10-CM

## 2019-02-08 NOTE — Telephone Encounter (Signed)
Copied from [place] [id number]. Topic: General - Other >> Feb 08, 2019 10:09 AM Carolyn Stare wrote:  Pt call to ask why the below meds was refused ,req  call back    doxazosin (CARDURA) 4 MG tablet    finasteride (PROSCAR) 5 MG tablet    You have been prescribing these two medications for him. He states that you have been prescribing them every since Dr. Manuella Ghazi left.

## 2019-02-13 ENCOUNTER — Other Ambulatory Visit: Payer: Self-pay | Admitting: Urology

## 2019-02-13 MED ORDER — FINASTERIDE 5 MG PO TABS: 5.0000 mg | ORAL_TABLET | Freq: Every day | ORAL | 1 refills | Status: DC

## 2019-02-13 MED ORDER — DOXAZOSIN MESYLATE 4 MG PO TABS: 4.0000 mg | ORAL_TABLET | Freq: Every day | ORAL | 1 refills | Status: DC

## 2019-02-13 NOTE — Telephone Encounter (Signed)
Spoke with patient and he would like a refill on cardura. Dr. Ancil Boozer has been filling but is no longer willing since she did not start him on this medication. Patient has not been seen in our office in over a year so a follow up appointment was made for him to be seen. He will contact Dr. Ancil Boozer for a temporary script until we can see him in July

## 2019-02-13 NOTE — Telephone Encounter (Signed)
Pt states he contacted his other doctor but can't get in until July and wants to know if Dr. Ancil Boozer will fill until then.

## 2019-02-13 NOTE — Telephone Encounter (Signed)
Pt LMOM to get some RX refills from [facility].  Pt didn't say which meds, but he hasn't seen [facility] since 09/2017.  Please advise.

## 2019-02-13 NOTE — Addendum Note (Signed)
Addended by: Steele Sizer F on: 02/13/2019 04:49 PM   Modules accepted: Orders

## 2019-02-14 NOTE — Telephone Encounter (Signed)
Patient notified his two prescription were sent in to [facility].

## 2019-02-19 ENCOUNTER — Other Ambulatory Visit: Payer: Self-pay | Admitting: Family Medicine

## 2019-02-19 DIAGNOSIS — Z794 Long term (current) use of insulin: Secondary | ICD-10-CM

## 2019-02-19 DIAGNOSIS — I10 Essential (primary) hypertension: Secondary | ICD-10-CM

## 2019-02-19 DIAGNOSIS — E119 Type 2 diabetes mellitus without complications: Secondary | ICD-10-CM

## 2019-03-19 DIAGNOSIS — H1820 Unspecified corneal edema: Secondary | ICD-10-CM | POA: Diagnosis not present

## 2019-03-26 ENCOUNTER — Encounter: Payer: Self-pay | Admitting: Urology

## 2019-03-26 ENCOUNTER — Other Ambulatory Visit: Payer: Self-pay

## 2019-03-26 ENCOUNTER — Ambulatory Visit (INDEPENDENT_AMBULATORY_CARE_PROVIDER_SITE_OTHER): Payer: Medicare HMO | Admitting: Urology

## 2019-03-26 VITALS — BP 142/79 | HR 85 | Ht 70.0 in | Wt 178.2 lb

## 2019-03-26 DIAGNOSIS — Z87898 Personal history of other specified conditions: Secondary | ICD-10-CM

## 2019-03-26 DIAGNOSIS — N138 Other obstructive and reflux uropathy: Secondary | ICD-10-CM

## 2019-03-26 DIAGNOSIS — R339 Retention of urine, unspecified: Secondary | ICD-10-CM | POA: Diagnosis not present

## 2019-03-26 DIAGNOSIS — N401 Enlarged prostate with lower urinary tract symptoms: Secondary | ICD-10-CM

## 2019-03-26 LAB — BLADDER SCAN AMB NON-IMAGING

## 2019-03-26 MED ORDER — DOXAZOSIN MESYLATE 4 MG PO TABS: 4.0000 mg | ORAL_TABLET | Freq: Every day | ORAL | 3 refills | Status: DC

## 2019-03-26 MED ORDER — DOXAZOSIN MESYLATE 4 MG PO TABS: 4.0000 mg | ORAL_TABLET | Freq: Every day | ORAL | 0 refills | Status: DC

## 2019-03-26 MED ORDER — FINASTERIDE 5 MG PO TABS: 5.0000 mg | ORAL_TABLET | Freq: Every day | ORAL | 0 refills | Status: DC

## 2019-03-26 MED ORDER — FINASTERIDE 5 MG PO TABS: 5.0000 mg | ORAL_TABLET | Freq: Every day | ORAL | 3 refills | Status: DC

## 2019-03-26 NOTE — Patient Instructions (Signed)

## 2019-03-26 NOTE — Progress Notes (Signed)
03/26/2019 11:21 AM   Tracy [name] 02/28/1930 [id number]  Referring provider: Steele Sizer, MD [address] [place],   [address]  Chief Complaint  Patient presents with  . Urinary Retention    Urologic history: 1.  BPH with lower urinary tract symptoms  -Episode urinary retention after spine surgery January 2019; resolved  -Combination therapy doxazosin/finasteride  HPI: I last saw Tracy [name] January 2019 and PVR at that visit was 0 mL.  He was doing well and was told he could follow-up as needed.  He states he was told Tracy [name] office would not refill his finasteride and doxazosin because he needed an appointment here although he was already on these medications when I initially saw him and I had not prescribed.  He states he has been doing well.  He has some decreased force and caliber of his urinary stream in the morning.  Denies dysuria, gross hematuria or flank/abdominal/pelvic/scrotal pain.  IPSS completed today was 10/1.   PMH: Past Medical History:  Diagnosis Date  . Arthritis   . Cancer ([place])    SKIN CANCER melanoma 2 sites and multiple squamous cell  . Diabetes mellitus without complication ([place])   . GERD (gastroesophageal reflux disease)   . Glaucoma   . Hyperlipidemia   . Hypertension   . Myocardial infarction ([place])    08/27/1979  . Vertigo     Surgical History: Past Surgical History:  Procedure Laterality Date  . CATARACT EXTRACTION W/ INTRAOCULAR LENS  IMPLANT, BILATERAL    . CIRCUMCISION    . GANGLION CYST EXCISION     LEFT HAND  . LUMBAR LAMINECTOMY/DECOMPRESSION MICRODISCECTOMY  09/13/2017   Procedure: BILATERAL Lumbar three-four , Left  Lumbar four-five  AND Left  L5-S1, LAMINECTOMY/FORAMINOTOMY;  Surgeon: Tracy Kos, MD;  Location: [place];  Service: Neurosurgery;;  . MELANOMA EXCISION  07/2011  . TONSILLECTOMY    . VASECTOMY      Home Medications:  Allergies as of 03/26/2019      Reactions   Tetracyclines &  Related Other (See Comments)   UNSPECIFIED REACTION  other      Medication List       Accurate as of March 26, 2019 11:21 AM. If you have any questions, ask your nurse or doctor.        Accu-Chek FastClix Lancets Misc CHECK  BLOOD  SUGAR TWICE DAILY   Accu-Chek Nano SmartView w/Device Kit USE AS DIRECTED   allopurinol 100 MG tablet Commonly known as: ZYLOPRIM Take 1 tablet (100 mg total) by mouth 2 (two) times daily.   amLODipine 5 MG tablet Commonly known as: NORVASC TAKE 2 TABLETS EVERY DAY   aspirin 81 MG tablet Take 81 mg by mouth.   atorvastatin 10 MG tablet Commonly known as: LIPITOR TAKE 1 TABLET DAILY AT 6 PM.   Centrum Silver 50+Men Tabs Take 1 tablet by mouth daily.   dorzolamide 2 % ophthalmic solution Commonly known as: TRUSOPT Place 1 drop into the left eye 2 (two) times daily.   doxazosin 4 MG tablet Commonly known as: CARDURA Take 1 tablet (4 mg total) by mouth at bedtime.   famotidine 20 MG tablet Commonly known as: PEPCID Take 1 tablet (20 mg total) by mouth daily as needed for heartburn or indigestion. In place of Ranitidine   finasteride 5 MG tablet Commonly known as: PROSCAR Take 1 tablet (5 mg total) by mouth at bedtime.   fluticasone 50 MCG/ACT nasal spray Commonly known as: FLONASE  Place 1 spray into both nostrils daily.   glipiZIDE 10 MG tablet Commonly known as: GLUCOTROL Take 1 tablet (10 mg total) by mouth 2 (two) times daily before a meal.   glucose blood test strip Commonly known as: Cool Blood Glucose Test Strips Use as instructed   Insulin Pen Needle 31G X 8 MM Misc Commonly known as: B-D ULTRAFINE III SHORT PEN USE AS DIRECTED WITH INSULIN PEN.   Lantus SoloStar 100 UNIT/ML Solostar Pen Generic drug: Insulin Glargine INJECT 48 UNITS SUBCUTANEOUSLY AT BEDTIME   levocetirizine 5 MG tablet Commonly known as: Xyzal Take 1 tablet (5 mg total) by mouth every evening.   lisinopril 40 MG tablet Commonly known as:  ZESTRIL Take 1 tablet (40 mg total) by mouth daily.   meclizine 25 MG tablet Commonly known as: ANTIVERT Take 1 tablet (25 mg total) by mouth 3 (three) times daily as needed for dizziness.   metFORMIN 1000 MG tablet Commonly known as: GLUCOPHAGE TAKE 1 TABLET (1,000 MG TOTAL) BY MOUTH 2 (TWO) TIMES DAILY WITH A MEAL.   NEEDLE (DISP) 14 G 14G X 1" Misc   NovoLOG FlexPen 100 UNIT/ML FlexPen Generic drug: insulin aspart PLEASE GIVE BEFORE EACH MEAL ACCORDING TO SLIDING SCALE. BG 151 200 GIVE 2 UNITS BG 201 250 GIVE 4 UNITS BG 251 300 GIVE 6 UNITS BG 300 AND   omega-3 fish oil 1000 MG Caps capsule Commonly known as: MAXEPA Take 2 capsules by mouth daily.   timolol 0.5 % ophthalmic solution Commonly known as: TIMOPTIC Place 1 drop into the left eye 2 (two) times daily.   vitamin B-12 500 MCG tablet Commonly known as: CYANOCOBALAMIN Take 1 tablet by mouth daily.   Vitamin D 50 MCG (2000 UT) tablet Take 2,000 Units by mouth daily.       Allergies:  Allergies  Allergen Reactions  . Tetracyclines & Related Other (See Comments)    UNSPECIFIED REACTION  other    Family History: Family History  Problem Relation Age of Onset  . Heart disease Mother   . Diabetes Mother        type 2  . Angina Mother   . Heart disease Brother   . Pneumonia Father   . Heart attack Father   . Alcoholism Daughter   . Cirrhosis Daughter     Social History:  reports that he quit smoking about 57 years ago. His smoking use included cigarettes. He has a 26.00 pack-year smoking history. He has never used smokeless tobacco. He reports current alcohol use of about 2.0 standard drinks of alcohol per week. He reports that he does not use drugs.  ROS: UROLOGY Frequent Urination?: No Hard to postpone urination?: No Burning/pain with urination?: No Get up at night to urinate?: No Leakage of urine?: No Urine stream starts and stops?: No Trouble starting stream?: No Do you have to strain to  urinate?: No Blood in urine?: No Urinary tract infection?: No Sexually transmitted disease?: No Injury to kidneys or bladder?: No Painful intercourse?: No Weak stream?: No Erection problems?: No Penile pain?: No  Gastrointestinal Nausea?: No Vomiting?: No Indigestion/heartburn?: No Diarrhea?: No Constipation?: No  Constitutional Fever: No Night sweats?: No Weight loss?: No Fatigue?: No  Skin Skin rash/lesions?: No Itching?: No  Eyes Blurred vision?: No Double vision?: No  Ears/Nose/Throat Sore throat?: No Sinus problems?: No  Hematologic/Lymphatic Swollen glands?: No Easy bruising?: No  Cardiovascular Leg swelling?: No Chest pain?: No  Respiratory Cough?: No Shortness of breath?: No  Endocrine  Excessive thirst?: No  Musculoskeletal Back pain?: No Joint pain?: No  Neurological Headaches?: No Dizziness?: No  Psychologic Depression?: No Anxiety?: No  Physical Exam: BP (!) 142/79 (BP Location: Left Arm, Patient Position: Sitting, Cuff Size: Normal)   Pulse 85   Ht _0  (1.778 m)   Wt 178 lb 3.2 oz (80.8 kg)   BMI 25.57 kg/m   Constitutional:  Alert and oriented, No acute distress. HEENT: [place] AT, moist mucus membranes.  Trachea midline, no masses. Cardiovascular: No clubbing, cyanosis, or edema. Respiratory: Normal respiratory effort, no increased work of breathing. GI: Abdomen is soft, nontender, nondistended, no abdominal masses GU: No CVA tenderness Skin: No rashes, bruises or suspicious lesions. Neurologic: Grossly intact, no focal deficits, moving all 4 extremities. Psychiatric: Normal mood and affect.   Assessment & Plan:   85 year old male with BPH and lower urinary tract symptoms.  Doxazosin and finasteride were refilled.  PVR by bladder scan today was moderate at 189 mL.  Will schedule a one-year follow-up with repeat bladder scan for PVR.  He will instructed to call earlier for worsening voiding symptoms.   Tracy [name], [place] [address] [place], [place] [address] [phone]

## 2019-03-29 ENCOUNTER — Other Ambulatory Visit: Payer: Self-pay

## 2019-03-29 DIAGNOSIS — E781 Pure hyperglyceridemia: Secondary | ICD-10-CM

## 2019-03-29 DIAGNOSIS — E785 Hyperlipidemia, unspecified: Secondary | ICD-10-CM

## 2019-03-29 DIAGNOSIS — M1A0710 Idiopathic chronic gout, right ankle and foot, without tophus (tophi): Secondary | ICD-10-CM

## 2019-03-29 NOTE — Telephone Encounter (Signed)
Request for diabetes medication. Glipizide to [facility]  Last office visit pertaining to diabetes: 11/26/2018   Lab Results  Component Value Date   HGBA1C 7.0 10/25/2018   Refill Request for Cholesterol medication Atorvastatin   Lab Results  Component Value Date   CHOL 104 07/26/2018   HDL 46 07/26/2018   LDLCALC 27 07/26/2018   TRIG 262 (H) 07/26/2018   CHOLHDL 2.3 07/26/2018   Refill request for Allopurinol   Follow up on 04/05/2019

## 2019-04-01 MED ORDER — ALLOPURINOL 100 MG PO TABS: 100.0000 mg | ORAL_TABLET | Freq: Two times a day (BID) | ORAL | 1 refills | Status: DC

## 2019-04-01 MED ORDER — GLIPIZIDE 10 MG PO TABS: 10.0000 mg | ORAL_TABLET | Freq: Two times a day (BID) | ORAL | 1 refills | Status: DC

## 2019-04-01 MED ORDER — ATORVASTATIN CALCIUM 10 MG PO TABS: ORAL_TABLET | ORAL | 1 refills | Status: DC

## 2019-04-01 NOTE — Telephone Encounter (Signed)
Patient is requesting RF of mail order Rx- sent to office for review of request

## 2019-04-01 NOTE — Telephone Encounter (Signed)
Left message for patient to return my call regarding medication if he needs them now or is he able to wait until his appointment for: Atorvastatin, Glipizide and Allpurinol.

## 2019-04-01 NOTE — Telephone Encounter (Signed)
Patient states he is out of all three of these medications and needs them to go to [facility] for delivery.

## 2019-04-05 ENCOUNTER — Ambulatory Visit (INDEPENDENT_AMBULATORY_CARE_PROVIDER_SITE_OTHER): Payer: Medicare HMO | Admitting: Family Medicine

## 2019-04-05 ENCOUNTER — Other Ambulatory Visit: Payer: Self-pay

## 2019-04-05 ENCOUNTER — Encounter: Payer: Self-pay | Admitting: Family Medicine

## 2019-04-05 VITALS — BP 132/70 | HR 63 | Temp 97.3°F | Resp 16 | Ht 70.0 in | Wt 180.7 lb

## 2019-04-05 DIAGNOSIS — Z79899 Other long term (current) drug therapy: Secondary | ICD-10-CM | POA: Diagnosis not present

## 2019-04-05 DIAGNOSIS — Z794 Long term (current) use of insulin: Secondary | ICD-10-CM | POA: Diagnosis not present

## 2019-04-05 DIAGNOSIS — I252 Old myocardial infarction: Secondary | ICD-10-CM | POA: Diagnosis not present

## 2019-04-05 DIAGNOSIS — H9191 Unspecified hearing loss, right ear: Secondary | ICD-10-CM

## 2019-04-05 DIAGNOSIS — M1A0710 Idiopathic chronic gout, right ankle and foot, without tophus (tophi): Secondary | ICD-10-CM | POA: Diagnosis not present

## 2019-04-05 DIAGNOSIS — E1129 Type 2 diabetes mellitus with other diabetic kidney complication: Secondary | ICD-10-CM | POA: Diagnosis not present

## 2019-04-05 DIAGNOSIS — R5383 Other fatigue: Secondary | ICD-10-CM

## 2019-04-05 DIAGNOSIS — E559 Vitamin D deficiency, unspecified: Secondary | ICD-10-CM | POA: Diagnosis not present

## 2019-04-05 DIAGNOSIS — I10 Essential (primary) hypertension: Secondary | ICD-10-CM

## 2019-04-05 DIAGNOSIS — K219 Gastro-esophageal reflux disease without esophagitis: Secondary | ICD-10-CM | POA: Diagnosis not present

## 2019-04-05 DIAGNOSIS — I739 Peripheral vascular disease, unspecified: Secondary | ICD-10-CM

## 2019-04-05 DIAGNOSIS — R809 Proteinuria, unspecified: Secondary | ICD-10-CM | POA: Diagnosis not present

## 2019-04-05 DIAGNOSIS — D649 Anemia, unspecified: Secondary | ICD-10-CM | POA: Diagnosis not present

## 2019-04-05 LAB — POCT GLYCOSYLATED HEMOGLOBIN (HGB A1C): Hemoglobin A1C: 7.1 % — AB (ref 4.0–5.6)

## 2019-04-05 MED ORDER — FAMOTIDINE 20 MG PO TABS: 20.0000 mg | ORAL_TABLET | Freq: Every day | ORAL | 1 refills | Status: DC | PRN

## 2019-04-05 MED ORDER — LISINOPRIL 40 MG PO TABS: 40.0000 mg | ORAL_TABLET | Freq: Every day | ORAL | 1 refills | Status: DC

## 2019-04-05 NOTE — Progress Notes (Signed)
Name: Tracy [name]   MRN: [id number]    DOB: [birth year]   Date:04/05/2019       Progress Note  Subjective  Chief Complaint  Chief Complaint  Patient presents with  . Diabetes  . Hyperlipidemia  . Hypertension    HPI  Claudication: he states he has noticed aching sensation on his thighs/feels tired when playing golf and unable to play 18 holes, noticed worsening of symptoms after he stopped going to the gym. He also noticed that soreness improves when he rests.   GERD: he has noticed that he wakes up a few hours after going to bed with burning sensation on his chest. He states it is not present when he does not eat tomatoes or drinks scotch at night. Discussed life style modification . He has famotidine but not taking it every night. He states medication works but does not like taking it every night.   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 He has been doing well since, denies chest pain, SOB or orthopnea.He denies side effects of medicationStill able to golf.   DMII: he sees Dr. Kenton Kingfisher at [facility] prn only . He had glipizide decreased by NP, but glucose went up and he went back to Endocrinologist and is back on Glipizide , lantus and metformin.  Feb hbA1C was 7%, today 7.1%  and is doing well now. Denies polyphagia, polyuria or polyphagia. Urine micro is up to date.   Hyperlipidemia: taking medication and denies side effects, LDL is at goal. No myalgia. Unchanged   HTN: under control with medication, no chest pain, feeling tired during activity, lack of energy. No palpation   Thrombocytopenia:stable, we will recheck labs today, also had some anemia with last lab work, denies easy bruising  Hearing loss sudden, happened after an URI back in Dec 2020  seen by Dr. Tami Ribas and had steroid and is using a hearing aid. Had PT , still feels off balance.   Osteoarthritis: he has OA of left thumb, he also states having medial  knee pain when moving/walking. No swelling, no redness, a brace on left knee helps. He has also noticed pain on left index finger. Discussed Tylenol max of 3 g daily    Patient Active Problem List   Diagnosis Date Noted  . Hearing loss on right 11/26/2018  . Labyrinthitis of right ear 10/25/2018  . Spinal stenosis of lumbar region 09/13/2017  . Spinal stenosis of lumbar region with neurogenic claudication 05/03/2017  . Type 2 diabetes mellitus without complication, with long-term current use of insulin ([place]) 05/03/2017  . Left hip pain 07/28/2016  . ALT (SGPT) level raised 07/13/2015  . At risk for falling 07/13/2015  . Arthritis of hand, degenerative 07/13/2015  . Gout 07/13/2015  . Glaucoma 07/13/2015  . Diabetic neuropathy ([place]) 07/13/2015  . Hypertension 04/09/2015  . Type 2 diabetes mellitus with microalbuminuria, with long-term current use of insulin ([place]) 04/09/2015  . Dyslipidemia 04/09/2015  . Hypertriglyceridemia 04/09/2015  . Benign fibroma of prostate 04/09/2015  . Acid reflux 04/09/2015  . History of melanoma excision 04/09/2015  . History of squamous cell carcinoma 05/23/2013  . H/O adenomatous polyp of colon 08/25/2010  . History of MI (myocardial infarction) 08/27/1979    Past Surgical History:  Procedure Laterality Date  . CATARACT EXTRACTION W/ INTRAOCULAR LENS  IMPLANT, BILATERAL    . CIRCUMCISION    . GANGLION CYST EXCISION     LEFT  HAND  . LUMBAR LAMINECTOMY/DECOMPRESSION MICRODISCECTOMY  09/13/2017   Procedure: BILATERAL Lumbar three-four , Left  Lumbar four-five  AND Left  L5-S1, LAMINECTOMY/FORAMINOTOMY;  Surgeon: Cram, Gary, MD;  Location: [facility] OR;  Service: Neurosurgery;;  . MELANOMA EXCISION  07/2011  . TONSILLECTOMY    . VASECTOMY      Family History  Problem Relation Age of Onset  . Heart disease Mother   . Diabetes Mother        type 2  . Angina Mother   . Heart disease Brother   . Pneumonia Father   . Heart attack Father   . Alcoholism  Daughter   . Cirrhosis Daughter     Social History   Socioeconomic History  . Marital status: Married    Spouse name: Nancy  . Number of children: 4  . Years of education: Not on file  . Highest education level: Bachelor's degree (e.g., BA, AB, BS)  Occupational History  . Occupation: Retired  Social Needs  . Financial resource strain: Not hard at all  . Food insecurity    Worry: Never true    Inability: Never true  . Transportation needs    Medical: No    Non-medical: No  Tobacco Use  . Smoking status: Former Smoker    Packs/day: 2.00    Years: 13.00    Pack years: 26.00    Types: Cigarettes    Quit date: 1963    Years since quitting: 57.6  . Smokeless tobacco: Never Used  . Tobacco comment: smoking cessation materials not required  Substance and Sexual Activity  . Alcohol use: Yes    Alcohol/week: 2.0 standard drinks    Types: 2 Shots of liquor per week    Comment: occasional  . Drug use: No  . Sexual activity: Not Currently  Lifestyle  . Physical activity    Days per week: 3 days    Minutes per session: 40 min  . Stress: Not at all  Relationships  . Social connections    Talks on phone: More than three times a week    Gets together: Three times a week    Attends religious service: Never    Active member of club or organization: Yes    Attends meetings of clubs or organizations: 1 to 4 times per year    Relationship status: Married  . Intimate partner violence    Fear of current or ex partner: No    Emotionally abused: No    Physically abused: No    Forced sexual activity: No  Other Topics Concern  . Not on file  Social History Narrative   Daughter Susan passed away 09/29/18.     Current Outpatient Medications:  .  ACCU-CHEK FASTCLIX LANCETS MISC, CHECK  BLOOD  SUGAR TWICE DAILY, Disp: 204 each, Rfl: 1 .  allopurinol (ZYLOPRIM) 100 MG tablet, Take 1 tablet (100 mg total) by mouth 2 (two) times daily., Disp: 180 tablet, Rfl: 1 .  amLODipine (NORVASC)  5 MG tablet, TAKE 2 TABLETS EVERY DAY, Disp: 180 tablet, Rfl: 1 .  aspirin 81 MG tablet, Take 81 mg by mouth., Disp: , Rfl:  .  atorvastatin (LIPITOR) 10 MG tablet, TAKE 1 TABLET DAILY AT 6 PM., Disp: 90 tablet, Rfl: 1 .  Blood Glucose Monitoring Suppl (ACCU-CHEK NANO SMARTVIEW) W/DEVICE KIT, USE AS DIRECTED, Disp: 1 kit, Rfl: 0 .  Cholecalciferol (VITAMIN D) 2000 units tablet, Take 2,000 Units by mouth daily., Disp: , Rfl:  .  dorzolamide (  TRUSOPT) 2 % ophthalmic solution, Place 1 drop into the left eye 2 (two) times daily. , Disp: , Rfl:  .  doxazosin (CARDURA) 4 MG tablet, Take 1 tablet (4 mg total) by mouth at bedtime., Disp: 90 tablet, Rfl: 3 .  famotidine (PEPCID) 20 MG tablet, Take 1 tablet (20 mg total) by mouth daily as needed for heartburn or indigestion. In place of Ranitidine, Disp: 90 tablet, Rfl: 1 .  finasteride (PROSCAR) 5 MG tablet, Take 1 tablet (5 mg total) by mouth at bedtime., Disp: 30 tablet, Rfl: 0 .  glipiZIDE (GLUCOTROL) 10 MG tablet, Take 1 tablet (10 mg total) by mouth 2 (two) times daily before a meal., Disp: 180 tablet, Rfl: 1 .  glucose blood (COOL BLOOD GLUCOSE TEST STRIPS) test strip, Use as instructed, Disp: 100 each, Rfl: 12 .  Insulin Pen Needle (B-D ULTRAFINE III SHORT PEN) 31G X 8 MM MISC, USE AS DIRECTED WITH INSULIN PEN., Disp: 100 each, Rfl: 5 .  LANTUS SOLOSTAR 100 UNIT/ML Solostar Pen, INJECT 48 UNITS SUBCUTANEOUSLY AT BEDTIME, Disp: 30 mL, Rfl: 2 .  levocetirizine (XYZAL) 5 MG tablet, Take 1 tablet (5 mg total) by mouth every evening., Disp: 30 tablet, Rfl: 0 .  lisinopril (PRINIVIL,ZESTRIL) 40 MG tablet, Take 1 tablet (40 mg total) by mouth daily., Disp: 90 tablet, Rfl: 1 .  metFORMIN (GLUCOPHAGE) 1000 MG tablet, TAKE 1 TABLET (1,000 MG TOTAL) BY MOUTH 2 (TWO) TIMES DAILY WITH A MEAL., Disp: 180 tablet, Rfl: 1 .  Multiple Vitamins-Minerals (CENTRUM SILVER 50+MEN) TABS, Take 1 tablet by mouth daily., Disp: , Rfl:  .  NEEDLE, DISP, 14 G 14G X 1" MISC, ,  Disp: , Rfl:  .  NOVOLOG FLEXPEN 100 UNIT/ML FlexPen, PLEASE GIVE BEFORE EACH MEAL ACCORDING TO SLIDING SCALE. BG 151 200 GIVE 2 UNITS BG 201 250 GIVE 4 UNITS BG 251 300 GIVE 6 UNITS BG 300 AND, Disp: , Rfl:  .  omega-3 fish oil (MAXEPA) 1000 MG CAPS capsule, Take 2 capsules by mouth daily. , Disp: , Rfl:  .  timolol (TIMOPTIC) 0.5 % ophthalmic solution, Place 1 drop into the left eye 2 (two) times daily. , Disp: , Rfl:  .  vitamin B-12 (CYANOCOBALAMIN) 500 MCG tablet, Take 1 tablet by mouth daily., Disp: , Rfl:  .  fluticasone (FLONASE) 50 MCG/ACT nasal spray, Place 1 spray into both nostrils daily. (Patient not taking: Reported on 04/05/2019), Disp: 16 g, Rfl: 2 .  meclizine (ANTIVERT) 25 MG tablet, Take 1 tablet (25 mg total) by mouth 3 (three) times daily as needed for dizziness. (Patient not taking: Reported on 04/05/2019), Disp: 30 tablet, Rfl: 0  Allergies  Allergen Reactions  . Tetracyclines & Related Other (See Comments)    UNSPECIFIED REACTION  other    I personally reviewed active problem list, medication list, allergies, family history, social history with the patient/caregiver today.   ROS  Ten systems reviewed and is negative except as mentioned in HPI   Objective  Vitals:   04/05/19 0910  BP: 132/70  Pulse: 63  Resp: 16  Temp: (!) 97.3 F (36.3 C)  TempSrc: Oral  SpO2: 96%  Weight: 180 lb 11.2 oz (82 kg)  Height: 5' 10" (1.778 m)    Body mass index is 25.93 kg/m.  Physical Exam  Constitutional: Patient appears well-developed and well-nourished.  No distress.  HEENT: head atraumatic, normocephalic, pupils equal and reactive to light, e neck supple Cardiovascular: Normal rate, regular rhythm and normal heart sounds.    No murmur heard. No BLE edema. Pulmonary/Chest: Effort normal and breath sounds normal. No respiratory distress. Abdominal: Soft.  There is no tenderness. Psychiatric: Patient has a normal mood and affect. behavior is normal. Judgment and thought  content normal.  Recent Results (from the past 2160 hour(s))  Bladder Scan (Post Void Residual) in office     Status: None   Collection Time: 03/26/19 11:16 AM  Result Value Ref Range   Scan Result 169m   POCT HgB A1C     Status: Abnormal   Collection Time: 04/05/19  9:41 AM  Result Value Ref Range   Hemoglobin A1C 7.1 (A) 4.0 - 5.6 %   HbA1c POC (<> result, manual entry)     HbA1c, POC (prediabetic range)     HbA1c, POC (controlled diabetic range)       PH[facility]2/9[facility] 04/05/2019 11/26/2018 11/09/2018 08/24/2018 08/13/2018  Decreased Interest 0 0 0 0 0  Down, Depressed, Hopeless 0 0 0 0 0  PHQ - 2 Score 0 0 0 0 0  Altered sleeping 0 1 0 0 -  Tired, decreased energy _0 0 -  Change in appetite 0 0 0 0 -  Feeling bad or failure about yourself  0 0 0 0 -  Trouble concentrating 0 1 0 0 -  Moving slowly or fidgety/restless 0 0 0 0 -  Suicidal thoughts 0 0 0 0 -  PHQ-9 Score _1 0 -  Difficult doing work/chores Not difficult at all Not difficult at all Not difficult at all Not difficult at all -  Some recent data might be hidden    phq 9 is negative   Fall Risk: Fall Risk  04/05/2019 11/26/2018 11/09/2018 08/24/2018 08/13/2018  Falls in the past year? 0 0 0 0 0  Number falls in past yr: 0 0 0 0 -  Injury with Fall? 0 0 0 0 -  Risk for fall due to : - - - - -  Risk for fall due to: Comment - - - - -  Follow up - - Falls prevention discussed - -    Functional Status Survey: Is the patient deaf or have difficulty hearing?: Yes Does the patient have difficulty seeing, even when wearing glasses/contacts?: Yes Does the patient have difficulty concentrating, remembering, or making decisions?: No Does the patient have difficulty walking or climbing stairs?: No Does the patient have difficulty dressing or bathing?: No Does the patient have difficulty doing errands alone such as visiting a doctor's office or shopping?: No    Assessment & Plan   1. Controlled  type 2 diabetes mellitus with microalbuminuria, with long-term current use of insulin (HCC)  - POCT HgB A1C  2. Chronic gout of right foot, unspecified cause   3. Essential hypertension  - lisinopril (ZESTRIL) 40 MG tablet; Take 1 tablet (40 mg total) by mouth daily.  Dispense: 90 tablet; Refill: 1 - COMPLETE METABOLIC PANEL WITH GFR - CBC with Differential/Platelet  4. Vitamin D deficiency  - VITAMIN D 25 Hydroxy (Vit-D Deficiency, Fractures)  5. Gastroesophageal reflux disease, esophagitis presence not specified  Resume famotidine  6. History of MI (myocardial infarction)  7. Claudication ([facility]  Does not want to go to vascular surgeon at this   8. Other fatigue  - B12 and Folate Panel  9. Acute hearing loss, right  He has hearing aid now   10. Anemia, unspecified type  - CBC with Differential/Platelet - Iron, TIBC and  Ferritin Panel 

## 2019-04-05 NOTE — Patient Instructions (Signed)
Intermittent Claudication Intermittent claudication is pain in one or both legs that occurs when walking or exercising and goes away when resting. Intermittent claudication is a symptom of peripheral arterial disease (PAD). This condition is commonly treated with rest, medicine, and healthy lifestyle changes. If medical management does not improve symptoms, surgery can be done to restore blood flow (revascularization) to the affected leg. What are the causes?  This condition is caused by buildup of fatty material (plaque) within the major arteries in the body (atherosclerosis). Plaque makes arteries stiff and narrow, which prevents enough blood from reaching the leg muscles. Pain occurs when you walk or exercise because your muscles need (but cannot get) more blood when you are moving and exercising. What increases the risk? The following factors may make you more likely to develop this condition:  A family history of atherosclerosis.  A personal history of stroke or heart disease.  Older age.  Being inactive (sedentary lifestyle).  Being overweight.  Smoking cigarettes.  Having another health condition such as: ? Diabetes. ? High blood pressure. ? High cholesterol. What are the signs or symptoms? Symptoms of this condition may first develop in the lower leg, and then they may spread to the thigh, hip, buttock, or the back of the lower leg (calf) over time. Symptoms may include:  Aches or pains.  Cramps.  A feeling of tightness, weakness, or heaviness.  A wound on the lower leg or foot that heals poorly or does not heal. How is this diagnosed? This condition may be diagnosed based on:  Your symptoms.  Your medical history.  Tests, such as: ? Blood tests. ? Arterial duplex ultrasound. This test uses images of blood vessels and surrounding organs to evaluate blood flow within arteries. ? Angiogram. In this procedure, dye is injected into arteries and then X-rays are taken.  ? Magnetic resonance angiogram (MRA). In this procedure, strong magnets and radio waves are used instead of X-rays to create images of blood vessels and blood flow. ? CT angiogram (CTA). In this procedure, a large X-ray machine called a CT scanner takes detailed pictures of blood vessels that have been injected with dye. ? Ankle-brachial index (ABI) test. This procedure measures blood pressure in the leg during exercise and at rest. ? Exercise test. For this test, you will walk on a treadmill while tests are done (such as the ABI test) to evaluate how this condition affects your ability to walk or exercise. How is this treated? Treatment for this condition may involve treatment for the underlying cause, such as treatment for high blood pressure, high cholesterol, or diabetes. Treatment may include:  Lifestyle changes such as: ? Starting a supervised or home-based exercise program. ? Losing weight. ? Quitting smoking.  Medicines to help restore blood flow through your legs.  Blood vessel surgery (angioplasty) to restore blood flow around the blocked vessel. This is also known as endovascular therapy (EVT). This is only done if your intermittent claudication is caused by severe peripheral artery disease, a condition in which blood flow is severely or totally restricted by the narrowing of the arteries. Follow these instructions at home: Lifestyle   Maintain a healthy weight.  Eat a diet that is low in saturated fats and calories. Consider working with a diet and nutrition specialist (dietitian) to help you make healthy food choices.  Do not use any products that contain nicotine or tobacco, such as cigarettes and e-cigarettes. If you need help quitting, ask your health care provider.  If   your health care provider recommended an exercise program for you, follow it as directed. Your exercise program may involve: ? Walking 3 or more times a week. ? Walking until you have certain symptoms of  intermittent claudication. ? Resting until symptoms go away. ? Gradually increasing your walking time to about 50 minutes a day. General instructions  Work with your health care provider to manage any other health conditions you may have, including diabetes, high blood pressure, or high cholesterol.  Take over-the-counter and prescription medicines only as told by your health care provider.  Keep all follow-up visits as told by your health care provider. This is important. Contact a health care provider if:  Your pain does not go away with rest.  You have sores on your legs that do not heal or have a bad smell or pus coming from them.  Your condition gets worse or does not get better with treatment. Get help right away if:  You have chest pain.  You have difficulty breathing.  You develop arm weakness.  You have trouble speaking.  Your face begins to droop.  Your foot or leg is cold or it changes color.  Your foot or leg becomes numb. These symptoms may represent a serious problem that is an emergency. Do not wait to see if the symptoms will go away. Get medical help right away. Call your local emergency services (911 in the [place] Do not drive yourself to the hospital.  Summary  Intermittent claudication is pain in one or both legs that occurs when walking or exercising and goes away when resting.  This condition is caused by buildup of fatty material (plaque) within the major arteries in the body (atherosclerosis). Plaque makes arteries stiff and narrow, which prevents enough blood from reaching the leg muscles.  Intermittent claudication can be treated with medicine and lifestyle changes. If medical treatment fails, surgery can be done to help return blood flow to the affected area.  Make sure you work with your health care provider to manage any other health conditions you may have, including diabetes, high blood pressure, or high cholesterol. This information is not  intended to replace advice given to you by your health care provider. Make sure you discuss any questions you have with your health care provider. Document Released: 07/01/2004 Document Revised: 08/11/2017 Document Reviewed: 09/29/2016 Elsevier Patient Education  2020 [employer]  

## 2019-04-06 LAB — COMPLETE METABOLIC PANEL WITH GFR
AG Ratio: 2 (calc) (ref 1.0–2.5)
ALT: 34 U/L (ref 9–46)
AST: 23 U/L (ref 10–35)
Albumin: 4.2 g/dL (ref 3.6–5.1)
Alkaline phosphatase (APISO): 41 U/L (ref 35–144)
BUN: 18 mg/dL (ref 7–25)
CO2: 28 mmol/L (ref 20–32)
Calcium: 9.5 mg/dL (ref 8.6–10.3)
Chloride: 104 mmol/L (ref 98–110)
Creat: 1.03 mg/dL (ref 0.70–1.11)
GFR, Est African American: 76 mL/min/{1.73_m2} (ref >=60)
GFR, Est Non African American: 65 mL/min/{1.73_m2} (ref >=60)
Globulin: 2.1 g/dL (calc) (ref 1.9–3.7)
Glucose, Bld: 229 mg/dL — ABNORMAL HIGH (ref 65–99)
Potassium: 4.6 mmol/L (ref 3.5–5.3)
Sodium: 137 mmol/L (ref 135–146)
Total Bilirubin: 0.5 mg/dL (ref 0.2–1.2)
Total Protein: 6.3 g/dL (ref 6.1–8.1)

## 2019-04-06 LAB — CBC WITH DIFFERENTIAL/PLATELET
Absolute Monocytes: 662 cells/uL (ref 200–950)
Basophils Absolute: 69 cells/uL (ref 0–200)
Basophils Relative: 1.1 %
Eosinophils Absolute: 391 cells/uL (ref 15–500)
Eosinophils Relative: 6.2 %
HCT: 37 % — ABNORMAL LOW (ref 38.5–50.0)
Hemoglobin: 13 g/dL — ABNORMAL LOW (ref 13.2–17.1)
Lymphs Abs: 1978 cells/uL (ref 850–3900)
MCH: 34.1 pg — ABNORMAL HIGH (ref 27.0–33.0)
MCHC: 35.1 g/dL (ref 32.0–36.0)
MCV: 97.1 fL (ref 80.0–100.0)
MPV: 12.4 fL (ref 7.5–12.5)
Monocytes Relative: 10.5 %
Neutro Abs: 3200 cells/uL (ref 1500–7800)
Neutrophils Relative %: 50.8 %
Platelets: 148 10*3/uL (ref 140–400)
RBC: 3.81 10*6/uL — ABNORMAL LOW (ref 4.20–5.80)
RDW: 13.3 % (ref 11.0–15.0)
Total Lymphocyte: 31.4 %
WBC: 6.3 10*3/uL (ref 3.8–10.8)

## 2019-04-06 LAB — IRON,TIBC AND FERRITIN PANEL
%SAT: 37 % (calc) (ref 20–48)
Ferritin: 167 ng/mL (ref 24–380)
Iron: 126 ug/dL (ref 50–180)
TIBC: 339 mcg/dL (calc) (ref 250–425)

## 2019-04-06 LAB — B12 AND FOLATE PANEL
Folate: >24 ng/mL
Vitamin B-12: 753 pg/mL (ref 200–1100)

## 2019-04-06 LAB — VITAMIN D 25 HYDROXY (VIT D DEFICIENCY, FRACTURES): Vit D, 25-Hydroxy: 37 ng/mL (ref 30–100)

## 2019-04-11 IMAGING — CT CT HEAD W/O CM
3 of 4 series · 16 of 47 positions shown, 19 images · non-contrast
Comparison: None.

CLINICAL DATA: Acute onset dizziness, nausea, and vomiting.

EXAM:
CT HEAD WITHOUT CONTRAST
TECHNIQUE: Contiguous axial images were obtained from the base of the skull
through the vertex without intravenous contrast.

[Series 2: head wo · axial · 0.43mm/px · z∈[-49,+91]mm · 10 of 34 slices shown, 13 images]
[im 3/34  brain]
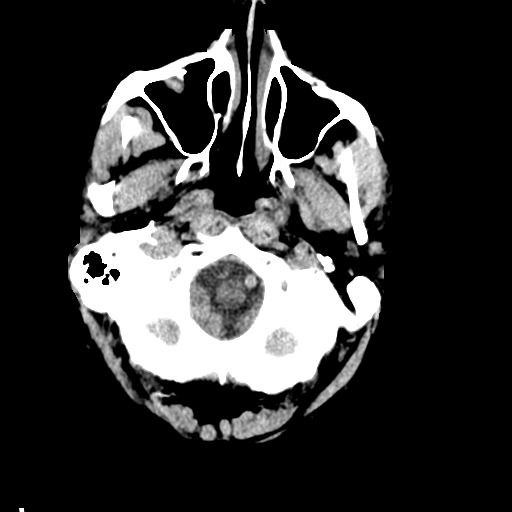
[im 3/34  bone]
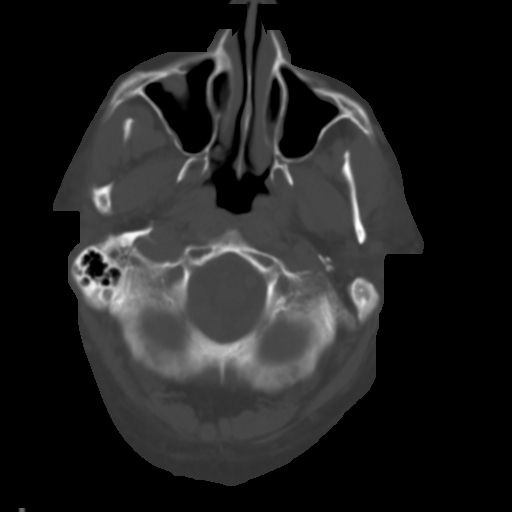
[im 7/34  brain]
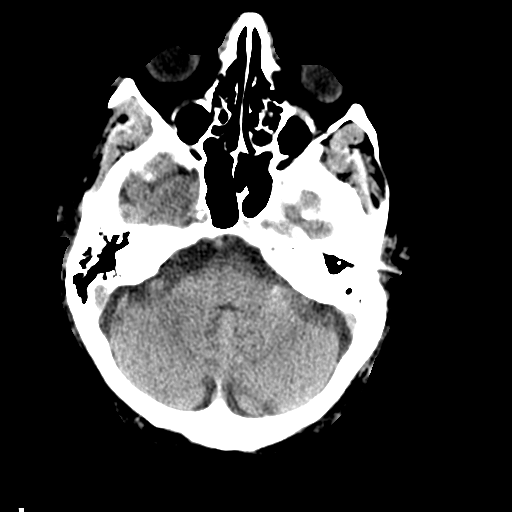
[im 9/34  brain]
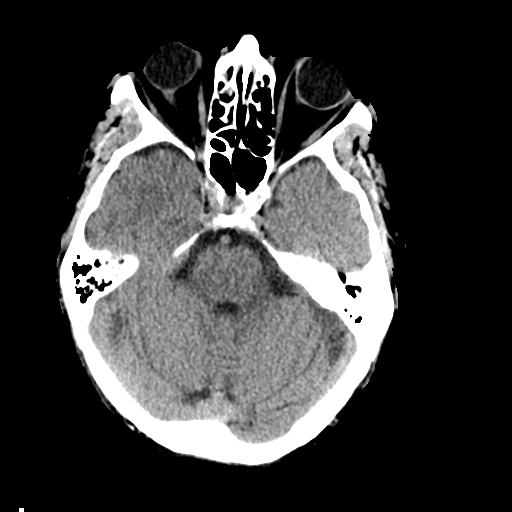
[im 12/34  brain]
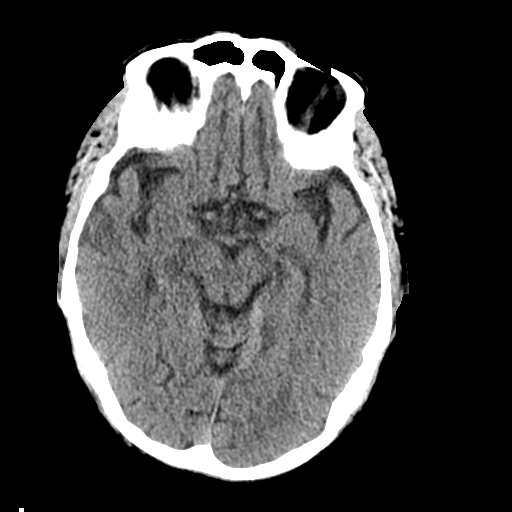
[im 16/34  brain]
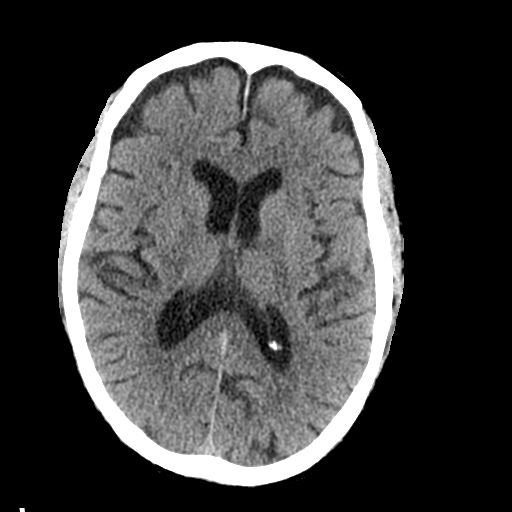
[im 16/34  bone]
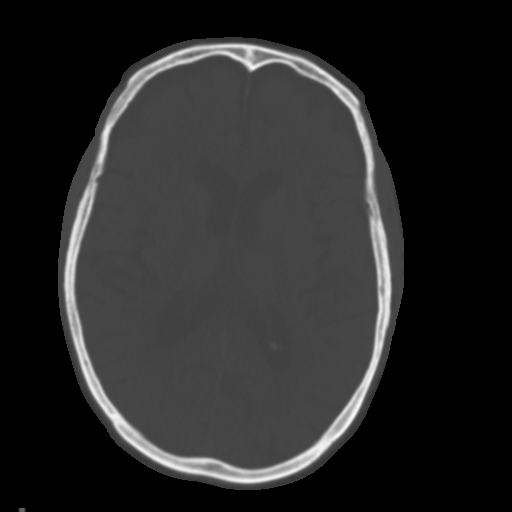
[im 18/34  brain]
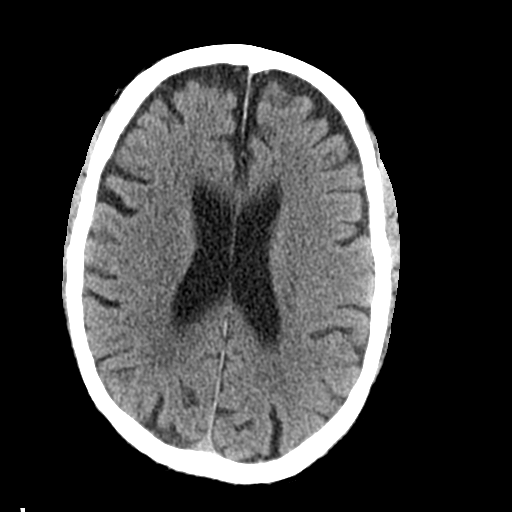
[im 23/34  brain]
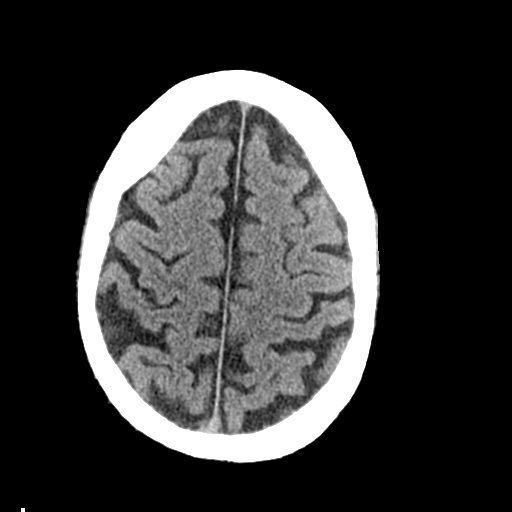
[im 25/34  brain]
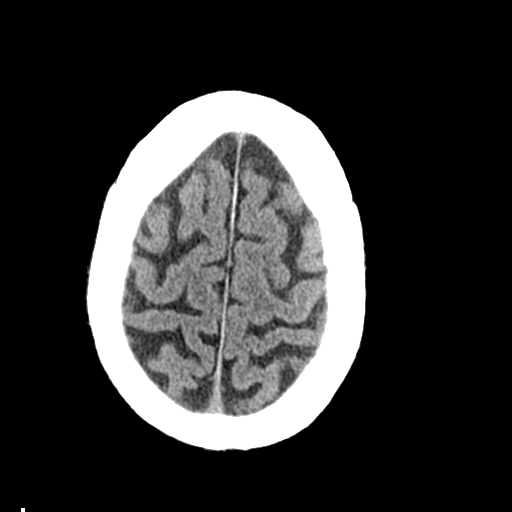
[im 27/34  brain]
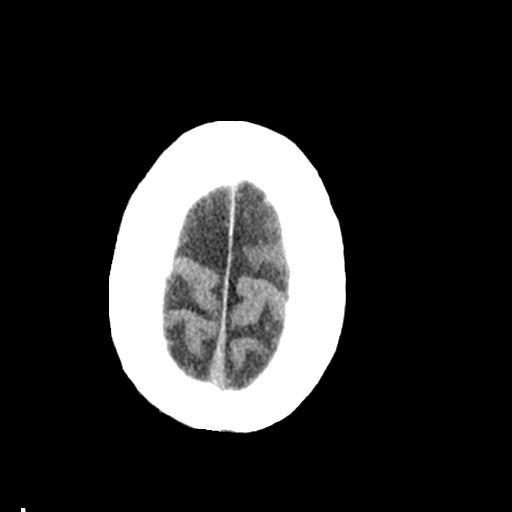
[im 27/34  bone]
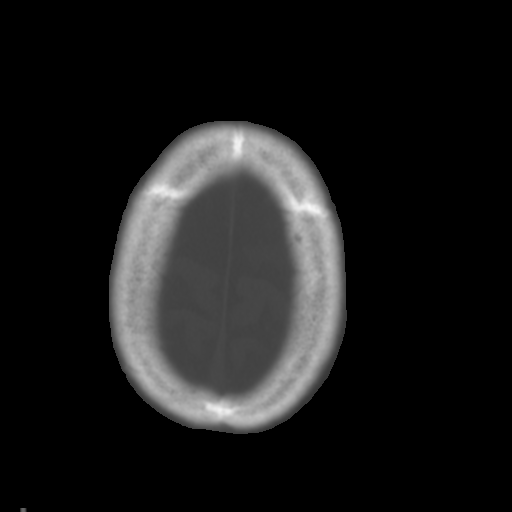
[im 31/34  brain]
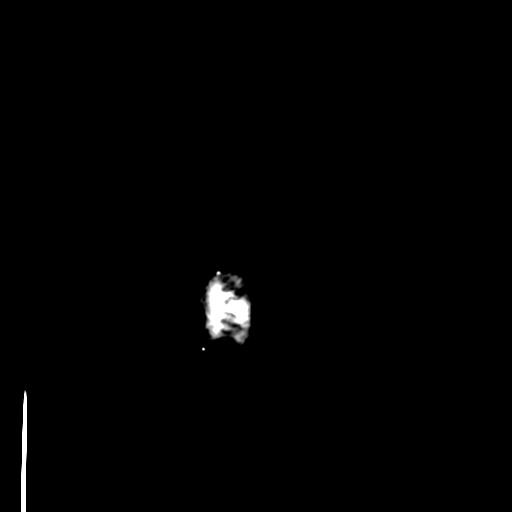

[Series 4: coronal soft tissue · coronal · 0.32mm/px · 3 of 64 slices shown]
[im 22/64  brain]
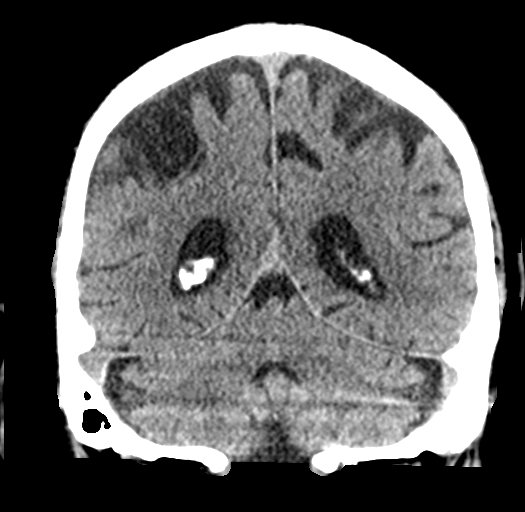
[im 29/64  brain]
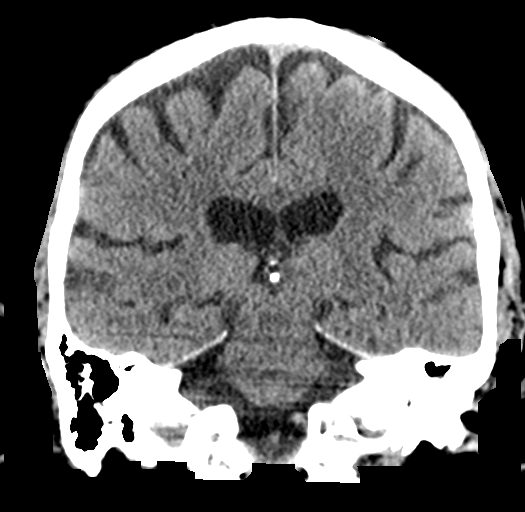
[im 36/64  brain]
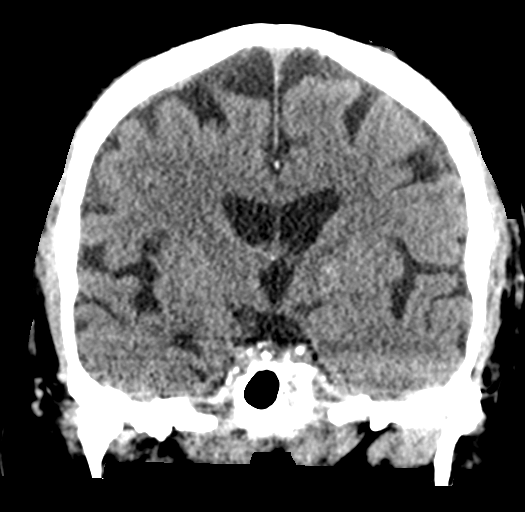

[Series 5: sagittal soft tissue · sagittal · 0.29mm/px · 3 of 55 slices shown]
[im 19/55  brain]
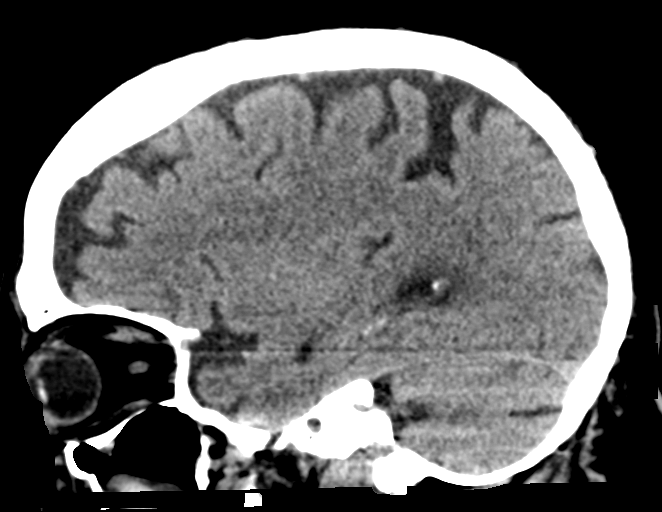
[im 28/55  brain]
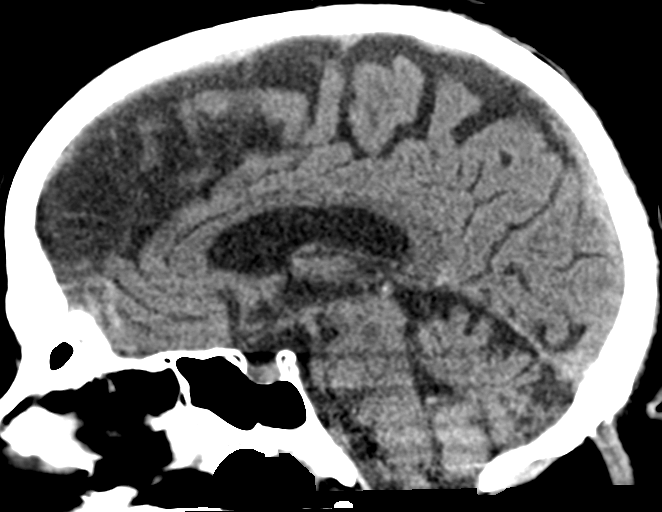
[im 37/55  brain]
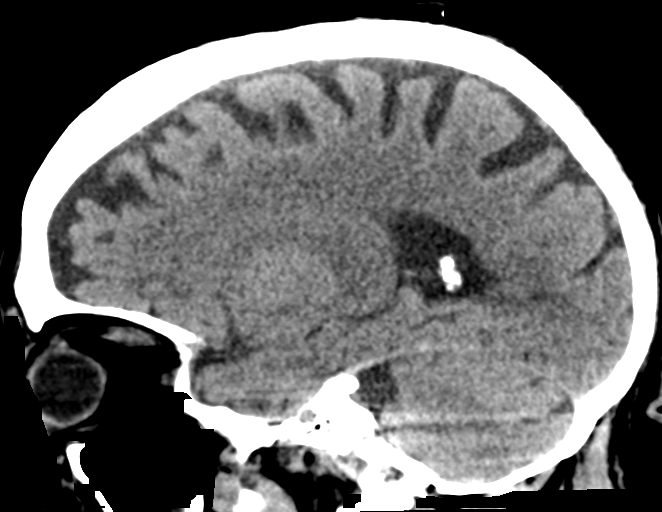

[16 of 47 positions shown; findings below may reference images not displayed]

FINDINGS: Brain: Diffuse cerebral atrophy. Ventricular dilatation consistent
with central atrophy. Patchy low-attenuation in the deep white
matter likely representing small vessel ischemia. No evidence of
acute infarction, hemorrhage, hydrocephalus, extra-axial collection
or mass lesion/mass effect.

Vascular: Mild to moderate intracranial arterial vascular
calcifications.

Skull: Calvarium appears intact.

Sinuses/Orbits: Mucosal thickening in the paranasal sinuses.
Retention cyst in the right maxillary antrum. No acute air-fluid
levels. Opacification of left mastoid air cells.

Other: None.
IMPRESSION: No acute intracranial abnormalities. Chronic atrophy and small
vessel ischemic changes. Left mastoid effusions.

## 2019-04-19 DIAGNOSIS — H18231 Secondary corneal edema, right eye: Secondary | ICD-10-CM | POA: Diagnosis not present

## 2019-05-01 ENCOUNTER — Telehealth: Payer: Self-pay | Admitting: Family Medicine

## 2019-05-01 NOTE — Telephone Encounter (Signed)
Pt is needing a letter stating that he is able and is recommanded that he can go to the [employer]. It needs to say that he has no reason that he can not work out or exercise.

## 2019-05-02 NOTE — Telephone Encounter (Signed)
Patient called.  Patient aware.  

## 2019-05-15 DIAGNOSIS — Z85828 Personal history of other malignant neoplasm of skin: Secondary | ICD-10-CM | POA: Diagnosis not present

## 2019-05-15 DIAGNOSIS — D485 Neoplasm of uncertain behavior of skin: Secondary | ICD-10-CM | POA: Diagnosis not present

## 2019-05-15 DIAGNOSIS — D225 Melanocytic nevi of trunk: Secondary | ICD-10-CM | POA: Diagnosis not present

## 2019-05-15 DIAGNOSIS — D2272 Melanocytic nevi of left lower limb, including hip: Secondary | ICD-10-CM | POA: Diagnosis not present

## 2019-05-15 DIAGNOSIS — D2271 Melanocytic nevi of right lower limb, including hip: Secondary | ICD-10-CM | POA: Diagnosis not present

## 2019-05-15 DIAGNOSIS — Z08 Encounter for follow-up examination after completed treatment for malignant neoplasm: Secondary | ICD-10-CM | POA: Diagnosis not present

## 2019-05-15 DIAGNOSIS — D2262 Melanocytic nevi of left upper limb, including shoulder: Secondary | ICD-10-CM | POA: Diagnosis not present

## 2019-05-15 DIAGNOSIS — Z85820 Personal history of malignant melanoma of skin: Secondary | ICD-10-CM | POA: Diagnosis not present

## 2019-05-15 DIAGNOSIS — D2261 Melanocytic nevi of right upper limb, including shoulder: Secondary | ICD-10-CM | POA: Diagnosis not present

## 2019-05-15 DIAGNOSIS — C44519 Basal cell carcinoma of skin of other part of trunk: Secondary | ICD-10-CM | POA: Diagnosis not present

## 2019-06-09 DIAGNOSIS — Z20828 Contact with and (suspected) exposure to other viral communicable diseases: Secondary | ICD-10-CM | POA: Diagnosis not present

## 2019-06-09 DIAGNOSIS — H18231 Secondary corneal edema, right eye: Secondary | ICD-10-CM | POA: Diagnosis not present

## 2019-06-10 HISTORY — PX: CORNEA LACERATION REPAIR: SHX355

## 2019-06-11 DIAGNOSIS — I444 Left anterior fascicular block: Secondary | ICD-10-CM | POA: Diagnosis not present

## 2019-06-11 DIAGNOSIS — I2510 Atherosclerotic heart disease of native coronary artery without angina pectoris: Secondary | ICD-10-CM | POA: Diagnosis not present

## 2019-06-11 DIAGNOSIS — H18231 Secondary corneal edema, right eye: Secondary | ICD-10-CM | POA: Diagnosis not present

## 2019-06-11 DIAGNOSIS — Z794 Long term (current) use of insulin: Secondary | ICD-10-CM | POA: Diagnosis not present

## 2019-06-11 DIAGNOSIS — D649 Anemia, unspecified: Secondary | ICD-10-CM | POA: Diagnosis not present

## 2019-06-11 DIAGNOSIS — I10 Essential (primary) hypertension: Secondary | ICD-10-CM | POA: Diagnosis not present

## 2019-06-11 DIAGNOSIS — I252 Old myocardial infarction: Secondary | ICD-10-CM | POA: Diagnosis not present

## 2019-06-11 DIAGNOSIS — N400 Enlarged prostate without lower urinary tract symptoms: Secondary | ICD-10-CM | POA: Diagnosis not present

## 2019-06-11 DIAGNOSIS — E1140 Type 2 diabetes mellitus with diabetic neuropathy, unspecified: Secondary | ICD-10-CM | POA: Diagnosis not present

## 2019-07-02 DIAGNOSIS — C44519 Basal cell carcinoma of skin of other part of trunk: Secondary | ICD-10-CM | POA: Diagnosis not present

## 2019-07-08 ENCOUNTER — Ambulatory Visit: Payer: Medicare HMO | Admitting: Family Medicine

## 2019-07-09 ENCOUNTER — Encounter: Payer: Self-pay | Admitting: Family Medicine

## 2019-07-09 ENCOUNTER — Ambulatory Visit: Payer: Medicare HMO | Admitting: Family Medicine

## 2019-07-09 ENCOUNTER — Other Ambulatory Visit: Payer: Self-pay

## 2019-07-09 VITALS — BP 118/64 | HR 59 | Temp 97.9°F | Resp 16 | Ht 70.0 in | Wt 179.9 lb

## 2019-07-09 DIAGNOSIS — I10 Essential (primary) hypertension: Secondary | ICD-10-CM | POA: Diagnosis not present

## 2019-07-09 DIAGNOSIS — E1129 Type 2 diabetes mellitus with other diabetic kidney complication: Secondary | ICD-10-CM | POA: Diagnosis not present

## 2019-07-09 DIAGNOSIS — Z23 Encounter for immunization: Secondary | ICD-10-CM | POA: Diagnosis not present

## 2019-07-09 DIAGNOSIS — I252 Old myocardial infarction: Secondary | ICD-10-CM

## 2019-07-09 DIAGNOSIS — N401 Enlarged prostate with lower urinary tract symptoms: Secondary | ICD-10-CM

## 2019-07-09 DIAGNOSIS — R809 Proteinuria, unspecified: Secondary | ICD-10-CM

## 2019-07-09 DIAGNOSIS — H9121 Sudden idiopathic hearing loss, right ear: Secondary | ICD-10-CM

## 2019-07-09 DIAGNOSIS — E785 Hyperlipidemia, unspecified: Secondary | ICD-10-CM

## 2019-07-09 DIAGNOSIS — I739 Peripheral vascular disease, unspecified: Secondary | ICD-10-CM | POA: Diagnosis not present

## 2019-07-09 DIAGNOSIS — E559 Vitamin D deficiency, unspecified: Secondary | ICD-10-CM | POA: Diagnosis not present

## 2019-07-09 DIAGNOSIS — R351 Nocturia: Secondary | ICD-10-CM

## 2019-07-09 DIAGNOSIS — Z794 Long term (current) use of insulin: Secondary | ICD-10-CM

## 2019-07-09 DIAGNOSIS — M109 Gout, unspecified: Secondary | ICD-10-CM | POA: Diagnosis not present

## 2019-07-09 MED ORDER — ALLOPURINOL 100 MG PO TABS: 100.0000 mg | ORAL_TABLET | Freq: Two times a day (BID) | ORAL | 1 refills | Status: DC

## 2019-07-09 MED ORDER — AMLODIPINE BESYLATE 5 MG PO TABS: 10.0000 mg | ORAL_TABLET | Freq: Every day | ORAL | 1 refills | Status: DC

## 2019-07-09 MED ORDER — GLIPIZIDE 10 MG PO TABS: 10.0000 mg | ORAL_TABLET | Freq: Two times a day (BID) | ORAL | 1 refills | Status: DC

## 2019-07-09 MED ORDER — METFORMIN HCL 1000 MG PO TABS: 1000.0000 mg | ORAL_TABLET | Freq: Two times a day (BID) | ORAL | 1 refills | Status: DC

## 2019-07-09 MED ORDER — LISINOPRIL 40 MG PO TABS: 40.0000 mg | ORAL_TABLET | Freq: Every day | ORAL | 1 refills | Status: DC

## 2019-07-09 MED ORDER — ATORVASTATIN CALCIUM 10 MG PO TABS: ORAL_TABLET | ORAL | 1 refills | Status: DC

## 2019-07-09 NOTE — Progress Notes (Signed)
Name: Tracy [name]   MRN: [id number]    DOB: 12/17/1929   Date:07/09/2019       Progress Note  Subjective  Chief Complaint  Chief Complaint  Patient presents with  . Follow-up    3 month follow up  . Medication Refill    HPI  S/p cornea transplant for and is still frustrated because he had to go back for two injections, he still has blurred vision on the right side   Claudication: he states he has noticed aching sensation on his thighs/feels tired when playing golf and unable to play 18 holes, noticed worsening of symptoms after he stopped going to the gym. He also noticed that soreness improves when he rests.   GERD: he has noticed that he wakes up a few hours after going to bed with burning sensation on his chest. He states it is not present when he does not eat tomatoes or drinks scotch at night. Discussed life style modification . 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 He has been doing well since, denies chest pain, SOB or orthopnea.He denies side effects of medication He had to stop physical activity because of eye surgery, but usually plays golf twice a week and also goes to the gym to ride exercise bike    DMII: he sees Dr. Kenton Kingfisher at [facility] prn only . He had glipizide decreased by NP, but glucose went up and he went back to Endocrinologist and is back on Glipizide , lantus 50 units and metformin.  Last visit was  7.1% and that is good for him. He states his glucose has gone down twice in the past couple of weeks - down to 60. He states when he cuts down on carbohydrates for dinner his glucose dropped  Denies polyphagia, polyuria or polyphagia. Urine micro is up to date.   Hyperlipidemia: taking medication and denies side effects, LDL is at goal. No myalgia.Reviewed labs   HTN: under control with medication, no chest pain, feeling tired  during activity, lack of energy. Unchanged  Hearing loss sudden, happened after an URI back in Dec 2020  seen by Dr. Tami Ribas and had steroid and is using a hearing aid. Had PT , still feels off balance, he states he is stable    Osteoarthritis: he has OA of left thumb, he also states having medial knee pain when moving/walking. No swelling, no redness, a brace on left knee helps. He has also noticed pain on left index finger. He states pain is not constant so he is taking prn otc medication    Patient Active Problem List   Diagnosis Date Noted  . Hearing loss on right 11/26/2018  . Labyrinthitis of right ear 10/25/2018  . Spinal stenosis of lumbar region 09/13/2017  . Spinal stenosis of lumbar region with neurogenic claudication 05/03/2017  . Type 2 diabetes mellitus without complication, with long-term current use of insulin ([place]) 05/03/2017  . Left hip pain 07/28/2016  . ALT (SGPT) level raised 07/13/2015  . At risk for falling 07/13/2015  . Arthritis of hand, degenerative 07/13/2015  . Gout 07/13/2015  . Glaucoma 07/13/2015  . Diabetic neuropathy ([place]) 07/13/2015  . Hypertension 04/09/2015  . Type 2 diabetes mellitus with microalbuminuria, with long-term current use of insulin ([place]) 04/09/2015  . Dyslipidemia 04/09/2015  . Hypertriglyceridemia 04/09/2015  . Benign fibroma  of prostate 04/09/2015  . Acid reflux 04/09/2015  . History of melanoma excision 04/09/2015  . History of squamous cell carcinoma 05/23/2013  . H/O adenomatous polyp of colon 08/25/2010  . History of MI (myocardial infarction) 08/27/1979    Past Surgical History:  Procedure Laterality Date  . CATARACT EXTRACTION W/ INTRAOCULAR LENS  IMPLANT, BILATERAL    . CIRCUMCISION    . GANGLION CYST EXCISION     LEFT HAND  . LUMBAR LAMINECTOMY/DECOMPRESSION MICRODISCECTOMY  09/13/2017   Procedure: BILATERAL Lumbar three-four , Left  Lumbar four-five  AND Left  L5-S1, LAMINECTOMY/FORAMINOTOMY;  Surgeon: [name],  MD;  Location: [place];  Service: Neurosurgery;;  . MELANOMA EXCISION  07/2011  . TONSILLECTOMY    . VASECTOMY      Family History  Problem Relation Age of Onset  . Heart disease Mother   . Diabetes Mother        type 2  . Angina Mother   . Heart disease Brother   . Pneumonia Father   . Heart attack Father   . Alcoholism Daughter   . Cirrhosis Daughter     Social History   Socioeconomic History  . Marital status: Married    Spouse name: [name]  . Number of children: 4  . Years of education: Not on file  . Highest education level: Bachelor's degree (e.g., BA, AB, BS)  Occupational History  . Occupation: Retired  Scientific laboratory technician  . Financial resource strain: Not hard at all  . Food insecurity    Worry: Never true    Inability: Never true  . Transportation needs    Medical: No    Non-medical: No  Tobacco Use  . Smoking status: Former Smoker    Packs/day: 2.00    Years: 13.00    Pack years: 26.00    Types: Cigarettes    Quit date: 1963    Years since quitting: 57.8  . Smokeless tobacco: Never Used  . Tobacco comment: smoking cessation materials not required  Substance and Sexual Activity  . Alcohol use: Yes    Alcohol/week: 2.0 standard drinks    Types: 2 Shots of liquor per week    Comment: occasional  . Drug use: No  . Sexual activity: Not Currently  Lifestyle  . Physical activity    Days per week: 3 days    Minutes per session: 40 min  . Stress: Not at all  Relationships  . Social connections    Talks on phone: More than three times a week    Gets together: Three times a week    Attends religious service: Never    Active member of club or organization: Yes    Attends meetings of clubs or organizations: 1 to 4 times per year    Relationship status: Married  . Intimate partner violence    Fear of current or ex partner: No    Emotionally abused: No    Physically abused: No    Forced sexual activity: No  Other Topics Concern  . Not on file  Social  History Narrative   Daughter Manuela Schwartz passed away 10-28-2018.     Current Outpatient Medications:  .  ACCU-CHEK FASTCLIX LANCETS MISC, CHECK  BLOOD  SUGAR TWICE DAILY, Disp: 204 each, Rfl: 1 .  allopurinol (ZYLOPRIM) 100 MG tablet, Take 1 tablet (100 mg total) by mouth 2 (two) times daily., Disp: 180 tablet, Rfl: 1 .  amLODipine (NORVASC) 5 MG tablet, TAKE 2 TABLETS EVERY DAY, Disp: 180 tablet,  Rfl: 1 .  aspirin 81 MG tablet, Take 81 mg by mouth., Disp: , Rfl:  .  atorvastatin (LIPITOR) 10 MG tablet, TAKE 1 TABLET DAILY AT 6 PM., Disp: 90 tablet, Rfl: 1 .  Blood Glucose Monitoring Suppl (ACCU-CHEK NANO SMARTVIEW) W/DEVICE KIT, USE AS DIRECTED, Disp: 1 kit, Rfl: 0 .  Cholecalciferol (VITAMIN D) 2000 units tablet, Take 2,000 Units by mouth daily., Disp: , Rfl:  .  dorzolamide (TRUSOPT) 2 % ophthalmic solution, Place 1 drop into the left eye 2 (two) times daily. , Disp: , Rfl:  .  doxazosin (CARDURA) 4 MG tablet, Take 1 tablet (4 mg total) by mouth at bedtime., Disp: 90 tablet, Rfl: 3 .  famotidine (PEPCID) 20 MG tablet, Take 1 tablet (20 mg total) by mouth daily as needed for heartburn or indigestion. In place of Ranitidine, Disp: 90 tablet, Rfl: 1 .  finasteride (PROSCAR) 5 MG tablet, Take 1 tablet (5 mg total) by mouth at bedtime., Disp: 30 tablet, Rfl: 0 .  glipiZIDE (GLUCOTROL) 10 MG tablet, Take 1 tablet (10 mg total) by mouth 2 (two) times daily before a meal., Disp: 180 tablet, Rfl: 1 .  glucose blood (COOL BLOOD GLUCOSE TEST STRIPS) test strip, Use as instructed, Disp: 100 each, Rfl: 12 .  Insulin Pen Needle (B-D ULTRAFINE III SHORT PEN) 31G X 8 MM MISC, USE AS DIRECTED WITH INSULIN PEN., Disp: 100 each, Rfl: 5 .  LANTUS SOLOSTAR 100 UNIT/ML Solostar Pen, INJECT 48 UNITS SUBCUTANEOUSLY AT BEDTIME, Disp: 30 mL, Rfl: 2 .  levocetirizine (XYZAL) 5 MG tablet, Take 1 tablet (5 mg total) by mouth every evening., Disp: 30 tablet, Rfl: 0 .  lisinopril (ZESTRIL) 40 MG tablet, Take 1 tablet (40 mg total)  by mouth daily., Disp: 90 tablet, Rfl: 1 .  metFORMIN (GLUCOPHAGE) 1000 MG tablet, TAKE 1 TABLET (1,000 MG TOTAL) BY MOUTH 2 (TWO) TIMES DAILY WITH A MEAL., Disp: 180 tablet, Rfl: 1 .  Multiple Vitamins-Minerals (CENTRUM SILVER 50+MEN) TABS, Take 1 tablet by mouth daily., Disp: , Rfl:  .  NEEDLE, DISP, 14 G 14G X 1" MISC, , Disp: , Rfl:  .  NOVOLOG FLEXPEN 100 UNIT/ML FlexPen, PLEASE GIVE BEFORE EACH MEAL ACCORDING TO SLIDING SCALE. BG 151 200 GIVE 2 UNITS BG 201 250 GIVE 4 UNITS BG 251 300 GIVE 6 UNITS BG 300 AND, Disp: , Rfl:  .  omega-3 fish oil (MAXEPA) 1000 MG CAPS capsule, Take 2 capsules by mouth daily. , Disp: , Rfl:  .  timolol (TIMOPTIC) 0.5 % ophthalmic solution, Place 1 drop into the left eye 2 (two) times daily. , Disp: , Rfl:  .  vitamin B-12 (CYANOCOBALAMIN) 500 MCG tablet, Take 1 tablet by mouth daily., Disp: , Rfl:   Allergies  Allergen Reactions  . Tetracyclines & Related Other (See Comments)    UNSPECIFIED REACTION  other    I personally reviewed active problem list, medication list, allergies, family history, social history, health maintenance with the patient/caregiver today.   ROS  Constitutional: Negative for fever or weight change.  Respiratory: Negative for cough and shortness of breath.   Cardiovascular: Negative for chest pain or palpitations.  Gastrointestinal: Negative for abdominal pain, no bowel changes.  Musculoskeletal: Negative for gait problem , positive for left thumb  joint swelling.  Skin: Negative for rash.  Neurological: Negative for dizziness or headache.  No other specific complaints in a complete review of systems (except as listed in HPI above).  Objective  Vitals:  07/09/19 0844  BP: 118/64  Pulse: (!) 59  Resp: 16  Temp: 97.9 F (36.6 C)  SpO2: 97%  Weight: 179 lb 14.4 oz (81.6 kg)  Height: 5' 10"  (1.778 m)    Body mass index is 25.81 kg/m.  Physical Exam  Constitutional: Patient appears well-developed and  well-nourished.No distress.  HEENT: head atraumatic, normocephalic, pupils equal and reactive to light Cardiovascular: Normal rate, regular rhythm and normal heart sounds.  No murmur heard. No BLE edema. Pulmonary/Chest: Effort normal and breath sounds normal. No respiratory distress. Abdominal: Soft.  There is no tenderness. Psychiatric: Patient has a normal mood and affect. behavior is normal. Judgment and thought content normal.  PHQ2/9: Depression screen [facility] 2/9 07/09/2019 04/05/2019 11/26/2018 11/09/2018 08/24/2018  Decreased Interest 0 0 0 0 0  Down, Depressed, Hopeless 0 0 0 0 0  PHQ - 2 Score 0 0 0 0 0  Altered sleeping 1 0 1 0 0  Tired, decreased energy 2 3 1 1  0  Change in appetite 0 0 0 0 0  Feeling bad or failure about yourself  0 0 0 0 0  Trouble concentrating 0 0 1 0 0  Moving slowly or fidgety/restless 0 0 0 0 0  Suicidal thoughts 0 0 0 0 0  PHQ-9 Score 3 3 3 1  0  Difficult doing work/chores Not difficult at all Not difficult at all Not difficult at all Not difficult at all Not difficult at all  Some recent data might be hidden    phq 9 is negative   Fall Risk: Fall Risk  07/09/2019 04/05/2019 11/26/2018 11/09/2018 08/24/2018  Falls in the past year? 0 0 0 0 0  Number falls in past yr: 0 0 0 0 0  Injury with Fall? 0 0 0 0 0  Risk for fall due to : - - - - -  Risk for fall due to: Comment - - - - -  Follow up - - - Falls prevention discussed -     Functional Status Survey: Is the patient deaf or have difficulty hearing?: Yes Does the patient have difficulty seeing, even when wearing glasses/contacts?: No Does the patient have difficulty concentrating, remembering, or making decisions?: No Does the patient have difficulty walking or climbing stairs?: No Does the patient have difficulty dressing or bathing?: No Does the patient have difficulty doing errands alone such as visiting a doctor's office or shopping?: No    Assessment & Plan  1. Controlled type 2  diabetes mellitus with microalbuminuria, with long-term current use of insulin ([facility])  - metFORMIN (GLUCOPHAGE) 1000 MG tablet; Take 1 tablet (1,000 mg total) by mouth 2 (two) times daily with a meal.  Dispense: 180 tablet; Refill: 1 - glipiZIDE (GLUCOTROL) 10 MG tablet; Take 1 tablet (10 mg total) by mouth 2 (two) times daily before a meal.  Dispense: 180 tablet; Refill: 1  2. Need for immunization against influenza  - Flu Vaccine QUAD High Dose(Fluad)  3. Claudication ([place])  On aspirin and statin therapy   4. Essential hypertension  - lisinopril (ZESTRIL) 40 MG tablet; Take 1 tablet (40 mg total) by mouth daily.  Dispense: 90 tablet; Refill: 1 - amLODipine (NORVASC) 5 MG tablet; Take 2 tablets (10 mg total) by mouth daily.  Dispense: 180 tablet; Refill: 1  5. History of MI (myocardial infarction)   6. Sudden idiopathic hearing loss of right ear with unrestricted hearing of left ear  Under the care of Dr. Tami Ribas  7. BPH associated  with nocturia  Stable  8. Vitamin D deficiency  Taking otc supplementation  9. Controlled gout  - allopurinol (ZYLOPRIM) 100 MG tablet; Take 1 tablet (100 mg total) by mouth 2 (two) times daily.  Dispense: 180 tablet; Refill: 1  10. Dyslipidemia  - atorvastatin (LIPITOR) 10 MG tablet; TAKE 1 TABLET DAILY AT 6 PM.  Dispense: 90 tablet; Refill: 1

## 2019-07-10 DIAGNOSIS — Z947 Corneal transplant status: Secondary | ICD-10-CM | POA: Diagnosis not present

## 2019-07-19 DIAGNOSIS — H903 Sensorineural hearing loss, bilateral: Secondary | ICD-10-CM | POA: Diagnosis not present

## 2019-07-19 DIAGNOSIS — H6123 Impacted cerumen, bilateral: Secondary | ICD-10-CM | POA: Diagnosis not present

## 2019-08-05 DIAGNOSIS — H1820 Unspecified corneal edema: Secondary | ICD-10-CM | POA: Diagnosis not present

## 2019-08-20 DIAGNOSIS — E113299 Type 2 diabetes mellitus with mild nonproliferative diabetic retinopathy without macular edema, unspecified eye: Secondary | ICD-10-CM | POA: Diagnosis not present

## 2019-08-30 DIAGNOSIS — H1820 Unspecified corneal edema: Secondary | ICD-10-CM | POA: Diagnosis not present

## 2019-09-16 DIAGNOSIS — H1820 Unspecified corneal edema: Secondary | ICD-10-CM | POA: Diagnosis not present

## 2019-10-07 DIAGNOSIS — H1820 Unspecified corneal edema: Secondary | ICD-10-CM | POA: Diagnosis not present

## 2019-10-10 ENCOUNTER — Other Ambulatory Visit: Payer: Self-pay | Admitting: Family Medicine

## 2019-10-10 DIAGNOSIS — K219 Gastro-esophageal reflux disease without esophagitis: Secondary | ICD-10-CM

## 2019-10-22 DIAGNOSIS — H903 Sensorineural hearing loss, bilateral: Secondary | ICD-10-CM | POA: Diagnosis not present

## 2019-10-22 DIAGNOSIS — H6123 Impacted cerumen, bilateral: Secondary | ICD-10-CM | POA: Diagnosis not present

## 2019-10-28 DIAGNOSIS — Z947 Corneal transplant status: Secondary | ICD-10-CM | POA: Diagnosis not present

## 2019-10-29 DIAGNOSIS — H52223 Regular astigmatism, bilateral: Secondary | ICD-10-CM | POA: Diagnosis not present

## 2019-10-29 DIAGNOSIS — H524 Presbyopia: Secondary | ICD-10-CM | POA: Diagnosis not present

## 2019-11-08 DIAGNOSIS — L708 Other acne: Secondary | ICD-10-CM | POA: Diagnosis not present

## 2019-11-08 DIAGNOSIS — D2261 Melanocytic nevi of right upper limb, including shoulder: Secondary | ICD-10-CM | POA: Diagnosis not present

## 2019-11-08 DIAGNOSIS — Z85820 Personal history of malignant melanoma of skin: Secondary | ICD-10-CM | POA: Diagnosis not present

## 2019-11-08 DIAGNOSIS — C44319 Basal cell carcinoma of skin of other parts of face: Secondary | ICD-10-CM | POA: Diagnosis not present

## 2019-11-08 DIAGNOSIS — D485 Neoplasm of uncertain behavior of skin: Secondary | ICD-10-CM | POA: Diagnosis not present

## 2019-11-08 DIAGNOSIS — Z85828 Personal history of other malignant neoplasm of skin: Secondary | ICD-10-CM | POA: Diagnosis not present

## 2019-11-08 DIAGNOSIS — L821 Other seborrheic keratosis: Secondary | ICD-10-CM | POA: Diagnosis not present

## 2019-11-08 DIAGNOSIS — D225 Melanocytic nevi of trunk: Secondary | ICD-10-CM | POA: Diagnosis not present

## 2019-11-08 DIAGNOSIS — X32XXXA Exposure to sunlight, initial encounter: Secondary | ICD-10-CM | POA: Diagnosis not present

## 2019-11-08 DIAGNOSIS — L570 Actinic keratosis: Secondary | ICD-10-CM | POA: Diagnosis not present

## 2019-11-12 ENCOUNTER — Other Ambulatory Visit: Payer: Self-pay

## 2019-11-12 ENCOUNTER — Ambulatory Visit (INDEPENDENT_AMBULATORY_CARE_PROVIDER_SITE_OTHER): Payer: Medicare HMO | Admitting: Family Medicine

## 2019-11-12 ENCOUNTER — Encounter: Payer: Self-pay | Admitting: Family Medicine

## 2019-11-12 VITALS — BP 110/72 | HR 87 | Temp 97.1°F | Resp 14 | Ht 70.0 in | Wt 178.7 lb

## 2019-11-12 DIAGNOSIS — M109 Gout, unspecified: Secondary | ICD-10-CM

## 2019-11-12 DIAGNOSIS — I10 Essential (primary) hypertension: Secondary | ICD-10-CM

## 2019-11-12 DIAGNOSIS — I739 Peripheral vascular disease, unspecified: Secondary | ICD-10-CM

## 2019-11-12 DIAGNOSIS — R809 Proteinuria, unspecified: Secondary | ICD-10-CM

## 2019-11-12 DIAGNOSIS — E785 Hyperlipidemia, unspecified: Secondary | ICD-10-CM

## 2019-11-12 DIAGNOSIS — N401 Enlarged prostate with lower urinary tract symptoms: Secondary | ICD-10-CM | POA: Diagnosis not present

## 2019-11-12 DIAGNOSIS — Z794 Long term (current) use of insulin: Secondary | ICD-10-CM | POA: Diagnosis not present

## 2019-11-12 DIAGNOSIS — D638 Anemia in other chronic diseases classified elsewhere: Secondary | ICD-10-CM

## 2019-11-12 DIAGNOSIS — K219 Gastro-esophageal reflux disease without esophagitis: Secondary | ICD-10-CM

## 2019-11-12 DIAGNOSIS — E1129 Type 2 diabetes mellitus with other diabetic kidney complication: Secondary | ICD-10-CM | POA: Diagnosis not present

## 2019-11-12 DIAGNOSIS — I252 Old myocardial infarction: Secondary | ICD-10-CM | POA: Diagnosis not present

## 2019-11-12 DIAGNOSIS — R351 Nocturia: Secondary | ICD-10-CM

## 2019-11-12 DIAGNOSIS — E559 Vitamin D deficiency, unspecified: Secondary | ICD-10-CM

## 2019-11-12 DIAGNOSIS — R197 Diarrhea, unspecified: Secondary | ICD-10-CM | POA: Diagnosis not present

## 2019-11-12 MED ORDER — LISINOPRIL 40 MG PO TABS: 40.0000 mg | ORAL_TABLET | Freq: Every day | ORAL | 1 refills | Status: DC

## 2019-11-12 MED ORDER — AMLODIPINE BESYLATE 5 MG PO TABS: 10.0000 mg | ORAL_TABLET | Freq: Every day | ORAL | 1 refills | Status: DC

## 2019-11-12 MED ORDER — ATORVASTATIN CALCIUM 10 MG PO TABS: ORAL_TABLET | ORAL | 1 refills | Status: DC

## 2019-11-12 MED ORDER — OMEPRAZOLE 40 MG PO CPDR: 40.0000 mg | DELAYED_RELEASE_CAPSULE | Freq: Every day | ORAL | 0 refills | Status: DC

## 2019-11-12 MED ORDER — ALLOPURINOL 100 MG PO TABS: 100.0000 mg | ORAL_TABLET | Freq: Two times a day (BID) | ORAL | 1 refills | Status: DC

## 2019-11-12 NOTE — Progress Notes (Signed)
Name: Tracy [name]   MRN: [id number]    DOB: Feb 13, 1930   Date:11/12/2019       Progress Note  Subjective  Chief Complaint  Chief Complaint  Patient presents with  . Diabetes    4 month follow up  . Hypertension  . Hyperlipidemia  . Gastroesophageal Reflux    HPI  S/p cornea transplant: 05/2019, he states he had a revision and states since last week he ordered a new prescription for his glasses   Claudication: he states he has noticed aching sensation on his thighs/feels tired when playing golf and unable to play 18 holes, he is back at the gym and has been doing better since riding the bike more often.  He also noticed that soreness improves when he rests.   GERD: he has noticed that he wakes up a few hours after going to bed with burning sensation on his chest. He states symptoms are getting worse. He continues to have two scotch's at night. He states he also has explosive diarrhea when the episodes are severe. Discussed referral to GI. Cut down on alcohol. 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 He has been doing well since, denies chest pain, SOB or orthopnea.He denies side effects of medication He has been physically active again and doing well.   DMII: he sees Dr. Kenton Kingfisher at [facility] only. He had glipizide decreased by NP, but glucose went up and he went back to Endocrinologist and is back on Glipizide , lantus 50 units and metformin.Last visit was  7.1%today we will send him to the lab to get A1C repeated.  He states his glucose in the morning has been better, his lowest was 68 ( only happens when he cuts down on carbs at dinner )  Denies polyphagia, polyuria or polyphagia.   Hyperlipidemia: taking medication and denies side effects, LDL is at goal. No myalgia.Recheck labs   HTN: under control with medication, no chest pain or  palpitation, no dizziness.   Hearing loss sudden, happened after an [place] in Dec 2020seen by Dr. Tami Ribas and had steroid and is using a hearing aid. Had PT , he is doing better but still not back to normal.   Osteoarthritis: he has OA of left thumb, he also states having left , intermittent medial knee pain when moving/walking but responding to topical medication  No swelling, no redness or increase in warmth.     Patient Active Problem List   Diagnosis Date Noted  . Anemia of chronic disease 11/12/2019  . Hearing loss on right 11/26/2018  . Labyrinthitis of right ear 10/25/2018  . Spinal stenosis of lumbar region 09/13/2017  . Spinal stenosis of lumbar region with neurogenic claudication 05/03/2017  . Type 2 diabetes mellitus without complication, with long-term current use of insulin ([place]) 05/03/2017  . Left hip pain 07/28/2016  . ALT (SGPT) level raised 07/13/2015  . At risk for falling 07/13/2015  . Arthritis of hand, degenerative 07/13/2015  . Gout 07/13/2015  . Glaucoma 07/13/2015  . Diabetic neuropathy ([place]) 07/13/2015  . Hypertension 04/09/2015  . Type 2 diabetes mellitus with microalbuminuria, with long-term current use of insulin ([place]) 04/09/2015  . Dyslipidemia 04/09/2015  . Hypertriglyceridemia 04/09/2015  . Benign fibroma of prostate 04/09/2015  . Acid reflux 04/09/2015  . History of melanoma excision 04/09/2015  . History of  squamous cell carcinoma 05/23/2013  . H/O adenomatous polyp of colon 08/25/2010  . History of MI (myocardial infarction) 08/27/1979    Past Surgical History:  Procedure Laterality Date  . CATARACT EXTRACTION W/ INTRAOCULAR LENS  IMPLANT, BILATERAL    . CIRCUMCISION    . GANGLION CYST EXCISION     LEFT HAND  . LUMBAR LAMINECTOMY/DECOMPRESSION MICRODISCECTOMY  09/13/2017   Procedure: BILATERAL Lumbar three-four , Left  Lumbar four-five  AND Left  L5-S1, LAMINECTOMY/FORAMINOTOMY;  Surgeon: [name], MD;  Location: [facility];  Service:  Neurosurgery;;  . MELANOMA EXCISION  07/2011  . TONSILLECTOMY    . VASECTOMY      Family History  Problem Relation Age of Onset  . Heart disease Mother   . Diabetes Mother        type 2  . Angina Mother   . Heart disease Brother   . Pneumonia Father   . Heart attack Father   . Alcoholism Daughter   . Cirrhosis Daughter     Social History   Tobacco Use  . Smoking status: Former Smoker    Packs/day: 2.00    Years: 13.00    Pack years: 26.00    Types: Cigarettes    Quit date: 1963    Years since quitting: 58.2  . Smokeless tobacco: Never Used  . Tobacco comment: smoking cessation materials not required  Substance Use Topics  . Alcohol use: Yes    Alcohol/week: 2.0 standard drinks    Types: 2 Shots of liquor per week    Comment: occasional     Current Outpatient Medications:  .  ACCU-CHEK FASTCLIX LANCETS MISC, CHECK  BLOOD  SUGAR TWICE DAILY, Disp: 204 each, Rfl: 1 .  allopurinol (ZYLOPRIM) 100 MG tablet, Take 1 tablet (100 mg total) by mouth 2 (two) times daily., Disp: 180 tablet, Rfl: 1 .  amLODipine (NORVASC) 5 MG tablet, Take 2 tablets (10 mg total) by mouth daily., Disp: 180 tablet, Rfl: 1 .  aspirin 81 MG tablet, Take 81 mg by mouth., Disp: , Rfl:  .  atorvastatin (LIPITOR) 10 MG tablet, TAKE 1 TABLET DAILY AT 6 PM., Disp: 90 tablet, Rfl: 1 .  Blood Glucose Monitoring Suppl (ACCU-CHEK NANO SMARTVIEW) W/DEVICE KIT, USE AS DIRECTED, Disp: 1 kit, Rfl: 0 .  brimonidine (ALPHAGAN) 0.2 % ophthalmic solution, OS, Disp: , Rfl:  .  Cholecalciferol (VITAMIN D) 2000 units tablet, Take 2,000 Units by mouth daily., Disp: , Rfl:  .  dorzolamide (TRUSOPT) 2 % ophthalmic solution, Place 1 drop into the left eye 2 (two) times daily. , Disp: , Rfl:  .  doxazosin (CARDURA) 4 MG tablet, Take 1 tablet (4 mg total) by mouth at bedtime., Disp: 90 tablet, Rfl: 3 .  famotidine (PEPCID) 20 MG tablet, TAKE 1 TABLET (20 MG TOTAL) BY MOUTH DAILY AS NEEDED FOR HEARTBURN OR INDIGESTION. IN  PLACE OF RANITIDINE, Disp: 90 tablet, Rfl: 1 .  finasteride (PROSCAR) 5 MG tablet, Take 1 tablet (5 mg total) by mouth at bedtime., Disp: 30 tablet, Rfl: 0 .  glipiZIDE (GLUCOTROL) 10 MG tablet, Take 1 tablet (10 mg total) by mouth 2 (two) times daily before a meal., Disp: 180 tablet, Rfl: 1 .  glucose blood (COOL BLOOD GLUCOSE TEST STRIPS) test strip, Use as instructed, Disp: 100 each, Rfl: 12 .  Insulin Pen Needle (B-D ULTRAFINE III SHORT PEN) 31G X 8 MM MISC, USE AS DIRECTED WITH INSULIN PEN., Disp: 100 each, Rfl: 5 .  LANTUS SOLOSTAR 100  UNIT/ML Solostar Pen, INJECT 48 UNITS SUBCUTANEOUSLY AT BEDTIME, Disp: 30 mL, Rfl: 2 .  levocetirizine (XYZAL) 5 MG tablet, Take 1 tablet (5 mg total) by mouth every evening., Disp: 30 tablet, Rfl: 0 .  lisinopril (ZESTRIL) 40 MG tablet, Take 1 tablet (40 mg total) by mouth daily., Disp: 90 tablet, Rfl: 1 .  metFORMIN (GLUCOPHAGE) 1000 MG tablet, Take 1 tablet (1,000 mg total) by mouth 2 (two) times daily with a meal., Disp: 180 tablet, Rfl: 1 .  Multiple Vitamins-Minerals (CENTRUM SILVER 50+MEN) TABS, Take 1 tablet by mouth daily., Disp: , Rfl:  .  NEEDLE, DISP, 14 G 14G X 1" MISC, , Disp: , Rfl:  .  omega-3 fish oil (MAXEPA) 1000 MG CAPS capsule, Take 2 capsules by mouth daily. , Disp: , Rfl:  .  timolol (TIMOPTIC) 0.5 % ophthalmic solution, Place 1 drop into the left eye 2 (two) times daily. , Disp: , Rfl:  .  vitamin B-12 (CYANOCOBALAMIN) 500 MCG tablet, Take 1 tablet by mouth daily., Disp: , Rfl:  .  omeprazole (PRILOSEC) 40 MG capsule, Take 1 capsule (40 mg total) by mouth daily before supper., Disp: 90 capsule, Rfl: 0  Allergies  Allergen Reactions  . Tetracyclines & Related Other (See Comments)    UNSPECIFIED REACTION  other    I personally reviewed active problem list, medication list, allergies, family history, social history, health maintenance with the patient/caregiver today.   ROS  Constitutional: Negative for fever or weight change.   Respiratory: Negative for cough and shortness of breath.   Cardiovascular: Negative for chest pain or palpitations.  Gastrointestinal: Negative for abdominal pain, positive for intermittent explosive diarrhea  Musculoskeletal: Negative for gait problem or joint swelling.  Skin: Negative for rash.  Neurological: Negative for dizziness or headache.  No other specific complaints in a complete review of systems (except as listed in HPI above).   Objective  Vitals:   11/12/19 0919  BP: 110/72  Pulse: 87  Resp: 14  Temp: (!) 97.1 F (36.2 C)  TempSrc: Temporal  SpO2: 93%  Weight: 178 lb 11.2 oz (81.1 kg)  Height: 5' 10"  (1.778 m)    Body mass index is 25.64 kg/m.  Physical Exam  Constitutional: Patient appears well-developed and well-nourished.  No distress.  HEENT: head atraumatic, normocephalic, pupils equal and reactive to light Cardiovascular: Normal rate, regular rhythm and normal heart sounds.  No murmur heard. No BLE edema. Pulmonary/Chest: Effort normal and breath sounds normal. No respiratory distress. Abdominal: Soft.  There is no tenderness. Psychiatric: Patient has a normal mood and affect. behavior is normal. Judgment and thought content normal.  Diabetic Foot Exam: Diabetic Foot Exam - Simple   Simple Foot Form Diabetic Foot exam was performed with the following findings: Yes 11/12/2019 10:12 AM  Visual Inspection No deformities, no ulcerations, no other skin breakdown bilaterally: Yes Sensation Testing Intact to touch and monofilament testing bilaterally: Yes Pulse Check Posterior Tibialis and Dorsalis pulse intact bilaterally: Yes Comments      PHQ2/9: Depression screen [facility] 2/9 11/12/2019 07/09/2019 04/05/2019 11/26/2018 11/09/2018  Decreased Interest 0 0 0 0 0  Down, Depressed, Hopeless 0 0 0 0 0  PHQ - 2 Score 0 0 0 0 0  Altered sleeping 0 1 0 1 0  Tired, decreased energy 0 2 3 1 1   Change in appetite 0 0 0 0 0  Feeling bad or failure about yourself   0 0 0 0 0  Trouble concentrating 0 0 0  1 0  Moving slowly or fidgety/restless 0 0 0 0 0  Suicidal thoughts 0 0 0 0 0  PHQ-9 Score 0 3 3 3 1   Difficult doing work/chores Not difficult at all Not difficult at all Not difficult at all Not difficult at all Not difficult at all  Some recent data might be hidden    phq 9 is negative   Fall Risk: Fall Risk  11/12/2019 07/09/2019 04/05/2019 11/26/2018 11/09/2018  Falls in the past year? 0 0 0 0 0  Number falls in past yr: 0 0 0 0 0  Injury with Fall? 0 0 0 0 0  Risk for fall due to : - - - - -  Risk for fall due to: Comment - - - - -  Follow up Falls evaluation completed - - - Falls prevention discussed    Functional Status Survey: Is the patient deaf or have difficulty hearing?: No Does the patient have difficulty seeing, even when wearing glasses/contacts?: No Does the patient have difficulty concentrating, remembering, or making decisions?: No Does the patient have difficulty walking or climbing stairs?: No Does the patient have difficulty dressing or bathing?: No Does the patient have difficulty doing errands alone such as visiting a doctor's office or shopping?: No   Assessment & Plan  1. Essential hypertension  - COMPLETE METABOLIC PANEL WITH GFR - CBC with Differential/Platelet - lisinopril (ZESTRIL) 40 MG tablet; Take 1 tablet (40 mg total) by mouth daily.  Dispense: 90 tablet; Refill: 1 - amLODipine (NORVASC) 5 MG tablet; Take 2 tablets (10 mg total) by mouth daily.  Dispense: 180 tablet; Refill: 1  2. Controlled type 2 diabetes mellitus with microalbuminuria, with long-term current use of insulin (H[facility])  - Microalbumin / creatinine urine ratio - Hemoglobin A1c  3. Claudication ([place])  stable  4. History of MI (myocardial infarction)  Doing well   5. BPH associated with nocturia  Keep follow up with [facility]   6. Dyslipidemia  - atorvastatin (LIPITOR) 10 MG tablet; TAKE 1 TABLET DAILY AT 6 PM.  Dispense: 90 tablet;  Refill: 1  7. Diarrhea, unspecified type  If no improvement we will refer him to GI  8. Controlled gout  - allopurinol (ZYLOPRIM) 100 MG tablet; Take 1 tablet (100 mg total) by mouth 2 (two) times daily.  Dispense: 180 tablet; Refill: 1  9. Vitamin D deficiency   10. GERD without esophagitis  - omeprazole (PRILOSEC) 40 MG capsule; Take 1 capsule (40 mg total) by mouth daily before supper.  Dispense: 90 capsule; Refill: 0  11. Anemia of chronic disease  Last HCT

## 2019-11-13 LAB — MICROALBUMIN / CREATININE URINE RATIO
Creatinine, Urine: 86 mg/dL (ref 20–320)
Microalb Creat Ratio: 56 mcg/mg creat — ABNORMAL HIGH (ref <30)
Microalb, Ur: 4.8 mg/dL

## 2019-11-13 LAB — CBC WITH DIFFERENTIAL/PLATELET
Absolute Monocytes: 586 cells/uL (ref 200–950)
Basophils Absolute: 50 cells/uL (ref 0–200)
Basophils Relative: 0.8 %
Eosinophils Absolute: 347 cells/uL (ref 15–500)
Eosinophils Relative: 5.5 %
HCT: 38.1 % — ABNORMAL LOW (ref 38.5–50.0)
Hemoglobin: 13.3 g/dL (ref 13.2–17.1)
Lymphs Abs: 2167 cells/uL (ref 850–3900)
MCH: 33.7 pg — ABNORMAL HIGH (ref 27.0–33.0)
MCHC: 34.9 g/dL (ref 32.0–36.0)
MCV: 96.5 fL (ref 80.0–100.0)
MPV: 11.5 fL (ref 7.5–12.5)
Monocytes Relative: 9.3 %
Neutro Abs: 3150 cells/uL (ref 1500–7800)
Neutrophils Relative %: 50 %
Platelets: 145 10*3/uL (ref 140–400)
RBC: 3.95 10*6/uL — ABNORMAL LOW (ref 4.20–5.80)
RDW: 12.6 % (ref 11.0–15.0)
Total Lymphocyte: 34.4 %
WBC: 6.3 10*3/uL (ref 3.8–10.8)

## 2019-11-13 LAB — HEMOGLOBIN A1C
Hgb A1c MFr Bld: 7.2 % of total Hgb — ABNORMAL HIGH (ref <5.7)
Mean Plasma Glucose: 160 (calc)
eAG (mmol/L): 8.9 (calc)

## 2019-11-13 LAB — COMPLETE METABOLIC PANEL WITH GFR
AG Ratio: 1.9 (calc) (ref 1.0–2.5)
ALT: 32 U/L (ref 9–46)
AST: 21 U/L (ref 10–35)
Albumin: 4.2 g/dL (ref 3.6–5.1)
Alkaline phosphatase (APISO): 42 U/L (ref 35–144)
BUN/Creatinine Ratio: 22 (calc) (ref 6–22)
BUN: 25 mg/dL (ref 7–25)
CO2: 24 mmol/L (ref 20–32)
Calcium: 9.1 mg/dL (ref 8.6–10.3)
Chloride: 105 mmol/L (ref 98–110)
Creat: 1.12 mg/dL — ABNORMAL HIGH (ref 0.70–1.11)
GFR, Est African American: 68 mL/min/{1.73_m2} (ref >=60)
GFR, Est Non African American: 59 mL/min/{1.73_m2} — ABNORMAL LOW (ref >=60)
Globulin: 2.2 g/dL (calc) (ref 1.9–3.7)
Glucose, Bld: 185 mg/dL — ABNORMAL HIGH (ref 65–99)
Potassium: 4.5 mmol/L (ref 3.5–5.3)
Sodium: 137 mmol/L (ref 135–146)
Total Bilirubin: 0.4 mg/dL (ref 0.2–1.2)
Total Protein: 6.4 g/dL (ref 6.1–8.1)

## 2019-11-14 ENCOUNTER — Other Ambulatory Visit: Payer: Self-pay

## 2019-11-14 ENCOUNTER — Ambulatory Visit (INDEPENDENT_AMBULATORY_CARE_PROVIDER_SITE_OTHER): Payer: Medicare HMO

## 2019-11-14 VITALS — Ht 70.0 in | Wt 178.0 lb

## 2019-11-14 DIAGNOSIS — Z0000 Encounter for general adult medical examination without abnormal findings: Secondary | ICD-10-CM

## 2019-11-14 NOTE — Patient Instructions (Signed)
Tracy [name] , Thank you for taking time to come for your Medicare Wellness Visit. I appreciate your ongoing commitment to your health goals. Please review the following plan we discussed and let me know if I can assist you in the future.   Screening recommendations/referrals: Colonoscopy: no longer required Recommended yearly ophthalmology/optometry visit for glaucoma screening and checkup Recommended yearly dental visit for hygiene and checkup  Vaccinations: Influenza vaccine: done 07/09/19 Pneumococcal vaccine: done 07/07/14 Tdap vaccine: done 11/28/16 Shingles vaccine: Shingrix discussed. Please contact your pharmacy for coverage information.  Covid-19: done 10/18/19 & 11/01/19  Advanced directives: Please bring a copy of your health care power of attorney and living will to the office at your convenience.  Conditions/risks identified: Recommend drinking 6-8 glasses of water per day  Next appointment: Please follow up in one year for your Medicare Annual Wellness visit.    Preventive Care 60 Years and Older, Male Preventive care refers to lifestyle choices and visits with your health care provider that can promote health and wellness. What does preventive care include?  A yearly physical exam. This is also called an annual well check.  Dental exams once or twice a year.  Routine eye exams. Ask your health care provider how often you should have your eyes checked.  Personal lifestyle choices, including:  Daily care of your teeth and gums.  Regular physical activity.  Eating a healthy diet.  Avoiding tobacco and drug use.  Limiting alcohol use.  Practicing safe sex.  Taking low doses of aspirin every day.  Taking vitamin and mineral supplements as recommended by your health care provider. What happens during an annual well check? The services and screenings done by your health care provider during your annual well check will depend on your age, overall health, lifestyle  risk factors, and family history of disease. Counseling  Your health care provider may ask you questions about your:  Alcohol use.  Tobacco use.  Drug use.  Emotional well-being.  Home and relationship well-being.  Sexual activity.  Eating habits.  History of falls.  Memory and ability to understand (cognition).  Work and work Statistician. Screening  You may have the following tests or measurements:  Height, weight, and BMI.  Blood pressure.  Lipid and cholesterol levels. These may be checked every 5 years, or more frequently if you are over 67 years old.  Skin check.  Lung cancer screening. You may have this screening every year starting at age 29 if you have a 30-pack-year history of smoking and currently smoke or have quit within the past 15 years.  Fecal occult blood test (FOBT) of the stool. You may have this test every year starting at age 41.  Flexible sigmoidoscopy or colonoscopy. You may have a sigmoidoscopy every 5 years or a colonoscopy every 10 years starting at age 67.  Prostate cancer screening. Recommendations will vary depending on your family history and other risks.  Hepatitis C blood test.  Hepatitis B blood test.  Sexually transmitted disease (STD) testing.  Diabetes screening. This is done by checking your blood sugar (glucose) after you have not eaten for a while (fasting). You may have this done every 1-3 years.  Abdominal aortic aneurysm (AAA) screening. You may need this if you are a current or former smoker.  Osteoporosis. You may be screened starting at age 79 if you are at high risk. Talk with your health care provider about your test results, treatment options, and if necessary, the need for more tests.  Vaccines  Your health care provider may recommend certain vaccines, such as:  Influenza vaccine. This is recommended every year.  Tetanus, diphtheria, and acellular pertussis (Tdap, Td) vaccine. You may need a Td booster every 10  years.  Zoster vaccine. You may need this after age 11.  Pneumococcal 13-valent conjugate (PCV13) vaccine. One dose is recommended after age 54.  Pneumococcal polysaccharide (PPSV23) vaccine. One dose is recommended after age 33. Talk to your health care provider about which screenings and vaccines you need and how often you need them. This information is not intended to replace advice given to you by your health care provider. Make sure you discuss any questions you have with your health care provider. Document Released: 09/25/2015 Document Revised: 05/18/2016 Document Reviewed: 06/30/2015 Elsevier Interactive Patient Education  2017 [employer] Prevention in the Home Falls can cause injuries. They can happen to people of all ages. There are many things you can do to make your home safe and to help prevent falls. What can I do on the outside of my home?  Regularly fix the edges of walkways and driveways and fix any cracks.  Remove anything that might make you trip as you walk through a door, such as a raised step or threshold.  Trim any bushes or trees on the path to your home.  Use bright outdoor lighting.  Clear any walking paths of anything that might make someone trip, such as rocks or tools.  Regularly check to see if handrails are loose or broken. Make sure that both sides of any steps have handrails.  Any raised decks and porches should have guardrails on the edges.  Have any leaves, snow, or ice cleared regularly.  Use sand or salt on walking paths during winter.  Clean up any spills in your garage right away. This includes oil or grease spills. What can I do in the bathroom?  Use night lights.  Install grab bars by the toilet and in the tub and shower. Do not use towel bars as grab bars.  Use non-skid mats or decals in the tub or shower.  If you need to sit down in the shower, use a plastic, non-slip stool.  Keep the floor dry. Clean up any water that  spills on the floor as soon as it happens.  Remove soap buildup in the tub or shower regularly.  Attach bath mats securely with double-sided non-slip rug tape.  Do not have throw rugs and other things on the floor that can make you trip. What can I do in the bedroom?  Use night lights.  Make sure that you have a light by your bed that is easy to reach.  Do not use any sheets or blankets that are too big for your bed. They should not hang down onto the floor.  Have a firm chair that has side arms. You can use this for support while you get dressed.  Do not have throw rugs and other things on the floor that can make you trip. What can I do in the kitchen?  Clean up any spills right away.  Avoid walking on wet floors.  Keep items that you use a lot in easy-to-reach places.  If you need to reach something above you, use a strong step stool that has a grab bar.  Keep electrical cords out of the way.  Do not use floor polish or wax that makes floors slippery. If you must use wax, use non-skid floor wax.  Do not have throw rugs and other things on the floor that can make you trip. What can I do with my stairs?  Do not leave any items on the stairs.  Make sure that there are handrails on both sides of the stairs and use them. Fix handrails that are broken or loose. Make sure that handrails are as long as the stairways.  Check any carpeting to make sure that it is firmly attached to the stairs. Fix any carpet that is loose or worn.  Avoid having throw rugs at the top or bottom of the stairs. If you do have throw rugs, attach them to the floor with carpet tape.  Make sure that you have a light switch at the top of the stairs and the bottom of the stairs. If you do not have them, ask someone to add them for you. What else can I do to help prevent falls?  Wear shoes that:  Do not have high heels.  Have rubber bottoms.  Are comfortable and fit you well.  Are closed at the  toe. Do not wear sandals.  If you use a stepladder:  Make sure that it is fully opened. Do not climb a closed stepladder.  Make sure that both sides of the stepladder are locked into place.  Ask someone to hold it for you, if possible.  Clearly mark and make sure that you can see:  Any grab bars or handrails.  First and last steps.  Where the edge of each step is.  Use tools that help you move around (mobility aids) if they are needed. These include:  Canes.  Walkers.  Scooters.  Crutches.  Turn on the lights when you go into a dark area. Replace any light bulbs as soon as they burn out.  Set up your furniture so you have a clear path. Avoid moving your furniture around.  If any of your floors are uneven, fix them.  If there are any pets around you, be aware of where they are.  Review your medicines with your doctor. Some medicines can make you feel dizzy. This can increase your chance of falling. Ask your doctor what other things that you can do to help prevent falls. This information is not intended to replace advice given to you by your health care provider. Make sure you discuss any questions you have with your health care provider. Document Released: 06/25/2009 Document Revised: 02/04/2016 Document Reviewed: 10/03/2014 Elsevier Interactive Patient Education  2017 [employer].

## 2019-11-14 NOTE — Progress Notes (Signed)
Subjective:   Tracy [name] is a 86 y.o. male who presents for Medicare Annual/Subsequent preventive examination.  Virtual Visit via Telephone Note  I connected with Tracy [name] on 11/14/19 at  8:40 AM EST by telephone and verified that I am speaking with the correct person using two identifiers.  Medicare Annual Wellness visit completed telephonically due to Covid-19 pandemic.   Location: Patient: home Provider: office   I discussed the limitations, risks, security and privacy concerns of performing an evaluation and management service by telephone and the availability of in person appointments. The patient expressed understanding and agreed to proceed.  Some vital signs may be absent or patient reported.   Clemetine Marker, LPN    Review of Systems:   Cardiac Risk Factors include: advanced age (>61mn, >>22women);diabetes mellitus;dyslipidemia;male gender;hypertension     Objective:    Vitals: Ht 5' 10"  (1.778 m)   Wt 178 lb (80.7 kg)   BMI 25.54 kg/m   Body mass index is 25.54 kg/m.  Advanced Directives 11/14/2019 11/09/2018 08/13/2018 11/07/2017 09/19/2017 09/13/2017 09/07/2017  Does Patient Have a Medical Advance Directive? Yes Yes Yes Yes Yes Yes Yes  Type of [place]of [place] will [place] will Living will Living will;Healthcare Power of Attorney Living will;Healthcare Power of Attorney  Does patient want to make changes to medical advance directive? - - - - - No - Patient declined -  Copy of HParcoalin Chart? No - copy requested No - copy requested - No - copy requested - No - copy requested No - copy requested  Would patient like information on creating a medical advance directive? - - - - - - -    Tobacco Social History   Tobacco Use  Smoking Status Former Smoker  . Packs/day: 2.00  . Years:  13.00  . Pack years: 26.00  . Types: Cigarettes  . Quit date: 1963  . Years since quitting: 58.2  Smokeless Tobacco Never Used  Tobacco Comment   smoking cessation materials not required     Counseling given: Not Answered Comment: smoking cessation materials not required   Clinical Intake:  Pre-visit preparation completed: Yes  Pain : No/denies pain     BMI - recorded: 25.54 Nutritional Status: BMI 25 -29 Overweight Nutritional Risks: Nausea/ vomitting/ diarrhea(occasional diarrhea) Diabetes: Yes CBG done?: No Did pt. bring in CBG monitor from home?: No   Nutrition Risk Assessment:  Has the patient had any N/V/D within the last 2 months?  Yes  Does the patient have any non-healing wounds?  No  Has the patient had any unintentional weight loss or weight gain?  No   Diabetes:  Is the patient diabetic?  Yes  If diabetic, was a CBG obtained today?  No  Did the patient bring in their glucometer from home?  No  How often do you monitor your CBG's? daily.   Financial Strains and Diabetes Management:  Are you having any financial strains with the device, your supplies or your medication? No. Does the patient want to be seen by Chronic Care Management for management of their diabetes?  No  Would the patient like to be referred to a Nutritionist or for Diabetic Management?  No   Diabetic Exams:  Diabetic Eye Exam: Completed Feb 2021.   Diabetic Foot Exam: Completed 11/12/19.   How often do you need to have someone help you  when you read instructions, pamphlets, or other written materials from your doctor or pharmacy?: 1 - Never  Interpreter Needed?: No  Information entered by :: Clemetine Marker LPN  Past Medical History:  Diagnosis Date  . Arthritis   . Cancer ([place])    SKIN CANCER melanoma 2 sites and multiple squamous cell  . Diabetes mellitus without complication ([place])   . GERD (gastroesophageal reflux disease)   . Glaucoma   . Hyperlipidemia   . Hypertension     . Myocardial infarction ([place])    08/27/1979  . Vertigo    Past Surgical History:  Procedure Laterality Date  . CATARACT EXTRACTION W/ INTRAOCULAR LENS  IMPLANT, BILATERAL    . CIRCUMCISION    . 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/10/2019  . GANGLION CYST EXCISION     LEFT HAND  . LUMBAR LAMINECTOMY/DECOMPRESSION MICRODISCECTOMY  09/13/2017   Procedure: BILATERAL Lumbar three-four , Left  Lumbar four-five  AND Left  L5-S1, LAMINECTOMY/FORAMINOTOMY;  Surgeon: [name], MD;  Location: [facility];  Service: Neurosurgery;;  . MELANOMA EXCISION  07/2011  . TONSILLECTOMY    . VASECTOMY     Family History  Problem Relation Age of Onset  . Heart disease Mother   . Diabetes Mother        type 2  . Angina Mother   . Heart disease Brother   . Pneumonia Father   . Heart attack Father   . Alcoholism Daughter   . Cirrhosis Daughter    Social History   Socioeconomic History  . Marital status: Married    Spouse name: [name]  . Number of children: 4  . Years of education: Not on file  . Highest education level: Bachelor's degree (e.g., BA, AB, BS)  Occupational History  . Occupation: Retired  Tobacco Use  . Smoking status: Former Smoker    Packs/day: 2.00    Years: 13.00    Pack years: 26.00    Types: Cigarettes    Quit date: 1963    Years since quitting: 58.2  . Smokeless tobacco: Never Used  . Tobacco comment: smoking cessation materials not required  Substance and Sexual Activity  . Alcohol use: Yes    Alcohol/week: 2.0 standard drinks    Types: 2 Shots of liquor per week    Comment: occasional  . Drug use: No  . Sexual activity: Not Currently  Other Topics Concern  . Not on file  Social History Narrative   Daughter Manuela Schwartz passed away 10/25/2018.   Social Determinants of Health   Financial Resource Strain: Low Risk   . Difficulty of Paying Living Expenses: Not hard at all  Food Insecurity: No Food Insecurity  . Worried About Charity fundraiser in the Last Year: Never true  . Ran  Out of Food in the Last Year: Never true  Transportation Needs: No Transportation Needs  . Lack of Transportation (Medical): No  . Lack of Transportation (Non-Medical): No  Physical Activity: Insufficiently Active  . Days of Exercise per Week: 3 days  . Minutes of Exercise per Session: 30 min  Stress: No Stress Concern Present  . Feeling of Stress : Not at all  Social Connections: Slightly Isolated  . Frequency of Communication with Friends and Family: More than three times a week  . Frequency of Social Gatherings with Friends and Family: Three times a week  . Attends Religious Services: Never  . Active Member of Clubs or Organizations: Yes  . Attends Archivist Meetings: 1  to 4 times per year  . Marital Status: Married    Outpatient Encounter Medications as of 11/14/2019  Medication Sig  . ACCU-CHEK FASTCLIX LANCETS MISC CHECK  BLOOD  SUGAR TWICE DAILY  . allopurinol (ZYLOPRIM) 100 MG tablet Take 1 tablet (100 mg total) by mouth 2 (two) times daily.  Marland Kitchen amLODipine (NORVASC) 5 MG tablet Take 2 tablets (10 mg total) by mouth daily.  Marland Kitchen aspirin 81 MG tablet Take 81 mg by mouth.  Marland Kitchen atorvastatin (LIPITOR) 10 MG tablet TAKE 1 TABLET DAILY AT 6 PM.  . Blood Glucose Monitoring Suppl (ACCU-CHEK NANO SMARTVIEW) W/DEVICE KIT USE AS DIRECTED  . Cholecalciferol (VITAMIN D) 2000 units tablet Take 2,000 Units by mouth daily.  . dorzolamide-timolol (COSOPT) 22.3-6.8 MG/ML ophthalmic solution Place 1 drop into the right eye 2 (two) times daily.  Marland Kitchen doxazosin (CARDURA) 4 MG tablet Take 1 tablet (4 mg total) by mouth at bedtime.  . famotidine (PEPCID) 20 MG tablet TAKE 1 TABLET (20 MG TOTAL) BY MOUTH DAILY AS NEEDED FOR HEARTBURN OR INDIGESTION. IN PLACE OF RANITIDINE  . finasteride (PROSCAR) 5 MG tablet Take 1 tablet (5 mg total) by mouth at bedtime.  Marland Kitchen glipiZIDE (GLUCOTROL) 10 MG tablet Take 1 tablet (10 mg total) by mouth 2 (two) times daily before a meal.  . glucose blood (COOL BLOOD GLUCOSE  TEST STRIPS) test strip Use as instructed  . Insulin Pen Needle (B-D ULTRAFINE III SHORT PEN) 31G X 8 MM MISC USE AS DIRECTED WITH INSULIN PEN.  . LANTUS SOLOSTAR 100 UNIT/ML Solostar Pen INJECT 48 UNITS SUBCUTANEOUSLY AT BEDTIME  . levocetirizine (XYZAL) 5 MG tablet Take 1 tablet (5 mg total) by mouth every evening.  Marland Kitchen lisinopril (ZESTRIL) 40 MG tablet Take 1 tablet (40 mg total) by mouth daily.  . metFORMIN (GLUCOPHAGE) 1000 MG tablet Take 1 tablet (1,000 mg total) by mouth 2 (two) times daily with a meal.  . Multiple Vitamins-Minerals (CENTRUM SILVER 50+MEN) TABS Take 1 tablet by mouth daily.  Marland Kitchen NEEDLE, DISP, 14 G 14G X 1" MISC   . omega-3 fish oil (MAXEPA) 1000 MG CAPS capsule Take 2 capsules by mouth daily.   Marland Kitchen omeprazole (PRILOSEC) 40 MG capsule Take 1 capsule (40 mg total) by mouth daily before supper.  . vitamin B-12 (CYANOCOBALAMIN) 500 MCG tablet Take 1 tablet by mouth daily.  . [DISCONTINUED] brimonidine (ALPHAGAN) 0.2 % ophthalmic solution OS  . [DISCONTINUED] dorzolamide (TRUSOPT) 2 % ophthalmic solution Place 1 drop into the left eye 2 (two) times daily.   . [DISCONTINUED] timolol (TIMOPTIC) 0.5 % ophthalmic solution Place 1 drop into the left eye 2 (two) times daily.    No facility-administered encounter medications on file as of 11/14/2019.    Activities of Daily Living In your present state of health, do you have any difficulty performing the following activities: 11/14/2019 11/12/2019  Hearing? Y N  Comment wears hearing aids -  Vision? N N  Difficulty concentrating or making decisions? N N  Walking or climbing stairs? N N  Dressing or bathing? N N  Doing errands, shopping? N N  Preparing Food and eating ? N -  Using the Toilet? N -  In the past six months, have you accidently leaked urine? N -  Do you have problems with loss of bowel control? N -  Managing your Medications? N -  Managing your Finances? N -  Housekeeping or managing your Housekeeping? N -  Some recent  data might be hidden  Patient Care Team: Steele Sizer, MD as PCP - General (Family Medicine) Dasher, Rayvon Char, MD (Dermatology) Abbie Sons, MD as Consulting Physician (Urology) [name], MD as Consulting Physician (Neurosurgery) Dorisann Frames, MD as Referring Physician (Endocrinology) Beverly Gust, MD (Otolaryngology)   Assessment:   This is a routine wellness examination for [facility].  Exercise Activities and Dietary recommendations Current Exercise Habits: Home exercise routine, Type of exercise: Other - see comments(exercise bike), Time (Minutes): 30, Frequency (Times/Week): 3, Weekly Exercise (Minutes/Week): 90, Intensity: Moderate, Exercise limited by: None identified  Goals    . DIET - INCREASE WATER INTAKE     Recommend to drink at least 6-8 8oz glasses of water per day.       Fall Risk Fall Risk  11/14/2019 11/12/2019 07/09/2019 04/05/2019 11/26/2018  Falls in the past year? 0 0 0 0 0  Number falls in past yr: 0 0 0 0 0  Injury with Fall? 0 0 0 0 0  Risk for fall due to : No Fall Risks - - - -  Risk for fall due to: Comment - - - - -  Follow up Falls prevention discussed Falls evaluation completed - - -   FALL RISK PREVENTION PERTAINING TO THE HOME:  Any stairs in or around the home? Yes  If so, do they handrails? Yes   Home free of loose throw rugs in walkways, pet beds, electrical cords, etc? Yes  Adequate lighting in your home to reduce risk of falls? Yes   ASSISTIVE DEVICES UTILIZED TO PREVENT FALLS:  Life alert? No  Use of a cane, walker or w/c? No  Grab bars in the bathroom? Yes  Shower chair or bench in shower? No  Elevated toilet seat or a handicapped toilet? No  DME ORDERS:  DME order needed?  No   TIMED UP AND GO:  Was the test performed? No . Telephonic visit.   Education: Fall risk prevention has been discussed.  Intervention(s) required? No   Depression Screen PHQ 2/9 Scores 11/14/2019 11/12/2019 07/09/2019 04/05/2019  PHQ -  2 Score 0 0 0 0  PHQ- 9 Score - 0 3 3    Cognitive Function     6CIT Screen 11/14/2019 11/09/2018 11/07/2017 11/28/2016  What Year? 0 points 0 points 0 points 0 points  What month? 0 points 0 points 0 points 0 points  What time? 0 points 0 points 0 points 0 points  Count back from 20 0 points 0 points 0 points 0 points  Months in reverse 0 points 0 points 0 points 0 points  Repeat phrase 0 points 0 points 2 points 0 points  Total Score 0 0 2 0    Immunization History  Administered Date(s) Administered  . Fluad Quad(high Dose 65+) 07/09/2019  . Influenza, High Dose Seasonal PF 07/07/2014, 07/28/2016, 06/27/2017, 07/26/2018  . Influenza, Seasonal, Injecte, Preservative Fre 07/16/2012  . Influenza,inj,Quad PF,6+ Mos 06/18/2013, 06/30/2015  . Influenza-Unspecified 07/16/2015  . PFIZER SARS-COV-2 Vaccination 10/18/2019, 11/01/2019  . Pneumococcal Conjugate-13 07/07/2014  . Pneumococcal Polysaccharide-23 07/26/2010  . Tdap 11/28/2016    Qualifies for Shingles Vaccine?  Yes . Due for Shingrix. Education has been provided regarding the importance of this vaccine. Pt has been advised to call insurance company to determine out of pocket expense. Advised may also receive vaccine at local pharmacy or Health Dept. Verbalized acceptance and understanding.  Tdap: Up to date  Flu Vaccine: Up to date  Pneumococcal Vaccine: Up to date   Screening  Tests Health Maintenance  Topic Date Due  . OPHTHALMOLOGY EXAM  09/21/2019  . HEMOGLOBIN A1C  05/14/2020  . FOOT EXAM  11/11/2020  . TETANUS/TDAP  11/29/2026  . INFLUENZA VACCINE  Completed  . PNA vac Low Risk Adult  Completed   Cancer Screenings:  Colorectal Screening: No longer required.   Lung Cancer Screening: (Low Dose CT Chest recommended if Age 45-80 years, 30 pack-year currently smoking OR have quit w/in 15years.) does not qualify.   Additional Screening:  Hepatitis C Screening: no longer required  Vision Screening: Recommended  annual ophthalmology exams for early detection of glaucoma and other disorders of the eye. Is the patient up to date with their annual eye exam?  Yes  Who is the provider or what is the name of the office in which the pt attends annual eye exams? [place] Screening: Recommended annual dental exams for proper oral hygiene  Community Resource Referral:  CRR required this visit?  No       Plan:     I have personally reviewed and addressed the Medicare Annual Wellness questionnaire and have noted the following in the patient's chart:  A. Medical and social history B. Use of alcohol, tobacco or illicit drugs  C. Current medications and supplements D. Functional ability and status E.  Nutritional status F.  Physical activity G. Advance directives H. List of other physicians I.  Hospitalizations, surgeries, and ER visits in previous 12 months J.  [place] such as hearing and vision if needed, cognitive and depression L. Referrals and appointments   In addition, I have reviewed and discussed with patient certain preventive protocols, quality metrics, and best practice recommendations. A written personalized care plan for preventive services as well as general preventive health recommendations were provided to patient.   Signed,  Clemetine Marker, LPN Nurse Health Advisor   Nurse Notes: none

## 2019-11-26 ENCOUNTER — Telehealth: Payer: Self-pay

## 2019-11-26 NOTE — Telephone Encounter (Signed)
Copied from [id number]. Topic: General - Inquiry >> Nov 26, 2019 11:00 AM Tracy [name] wrote: Reason for CRM:   Pt states that he needs results of latest lab work for endocrinologist (12/04/2019) and wants to know how to obtain.  He sees [facility]. No labs obtained since 06/02/2019. Called patient he was not available spoke with wife and she said they both needed to reset passwords to [place]. Call was transferred to front for assistance. Also told wife to have him call me back if he had any additional questions.

## 2019-11-27 NOTE — Telephone Encounter (Signed)
Done

## 2019-12-02 DIAGNOSIS — C44311 Basal cell carcinoma of skin of nose: Secondary | ICD-10-CM | POA: Diagnosis not present

## 2019-12-02 DIAGNOSIS — C4401 Basal cell carcinoma of skin of lip: Secondary | ICD-10-CM | POA: Diagnosis not present

## 2020-01-01 DIAGNOSIS — T383X5A Adverse effect of insulin and oral hypoglycemic [antidiabetic] drugs, initial encounter: Secondary | ICD-10-CM | POA: Diagnosis not present

## 2020-01-01 DIAGNOSIS — E1129 Type 2 diabetes mellitus with other diabetic kidney complication: Secondary | ICD-10-CM | POA: Diagnosis not present

## 2020-01-01 DIAGNOSIS — E160 Drug-induced hypoglycemia without coma: Secondary | ICD-10-CM | POA: Diagnosis not present

## 2020-01-01 DIAGNOSIS — Z794 Long term (current) use of insulin: Secondary | ICD-10-CM | POA: Diagnosis not present

## 2020-01-01 DIAGNOSIS — R809 Proteinuria, unspecified: Secondary | ICD-10-CM | POA: Diagnosis not present

## 2020-01-06 DIAGNOSIS — H1820 Unspecified corneal edema: Secondary | ICD-10-CM | POA: Diagnosis not present

## 2020-01-15 ENCOUNTER — Telehealth: Payer: Self-pay | Admitting: Family Medicine

## 2020-01-15 NOTE — Chronic Care Management (AMB) (Signed)
  Chronic Care Management   Note  01/15/2020 Name: Tracy [name] MRN: [id number] DOB: 19-Mar-1930  Tracy [name] is a 86 y.o. year old male who is a primary care patient of Steele Sizer, MD. I reached out to Tracy [name] by phone today in response to a referral sent by Tracy [name] health plan.     Tracy [name] was given information about Chronic Care Management services today including:  1. CCM service includes personalized support from designated clinical staff supervised by his physician, including individualized plan of care and coordination with other care providers 2. 24/7 contact phone numbers for assistance for urgent and routine care needs. 3. Service will only be billed when office clinical staff spend 20 minutes or more in a month to coordinate care. 4. Only one practitioner may furnish and bill the service in a calendar month. 5. The patient may stop CCM services at any time (effective at the end of the month) by phone call to the office staff. 6. The patient will be responsible for cost sharing (co-pay) of up to 20% of the service fee (after annual deductible is met).  Patient did not agree to enrollment in care management services and does not wish to consider at this time.  Follow up plan: The patient has been provided with contact information for the care management team and has been advised to call with any health related questions or concerns.   Noreene Larsson, [address], [place], [place] [address] Direct Dial: [phone] [email or web address] Website: [email or web address]

## 2020-01-21 ENCOUNTER — Other Ambulatory Visit: Payer: Self-pay | Admitting: Family Medicine

## 2020-01-21 DIAGNOSIS — K219 Gastro-esophageal reflux disease without esophagitis: Secondary | ICD-10-CM

## 2020-01-21 NOTE — Telephone Encounter (Signed)
Requested Prescriptions  Pending Prescriptions Disp Refills  . omeprazole (PRILOSEC) 40 MG capsule [Pharmacy Med Name: OMEPRAZOLE 40 MG Capsule Delayed Release] 90 capsule 0    Sig: TAKE 1 CAPSULE BY MOUTH DAILY BEFORE SUPPER.     Gastroenterology: Proton Pump Inhibitors Passed - 01/21/2020 12:08 PM      Passed - Valid encounter within last 12 months    Recent Outpatient Visits          2 months ago Controlled type 2 diabetes mellitus with microalbuminuria, with long-term current use of insulin [facility])   [facility] [place], Drue Stager, MD   6 months ago Controlled type 2 diabetes mellitus with microalbuminuria, with long-term current use of insulin [facility])   [facility] [place], Drue Stager, MD   9 months ago Controlled type 2 diabetes mellitus with microalbuminuria, with long-term current use of insulin [facility])   [facility] [place], Drue Stager, MD   1 year ago Controlled type 2 diabetes mellitus with microalbuminuria, with long-term current use of insulin [facility])   [facility] Steele Sizer, MD   1 year ago Eustachian tube disorder, left   [place]ry Hall, NP      Future Appointments            In 3 weeks Steele Sizer, MD [facility] [place]   In 2 months Sto[place], Ronda Fairly, MD [place]   In 10 months  [facility]

## 2020-02-12 ENCOUNTER — Other Ambulatory Visit: Payer: Self-pay

## 2020-02-12 ENCOUNTER — Ambulatory Visit: Payer: Medicare HMO | Admitting: Family Medicine

## 2020-02-12 ENCOUNTER — Encounter: Payer: Self-pay | Admitting: Family Medicine

## 2020-02-12 VITALS — BP 126/62 | HR 79 | Temp 97.8°F | Resp 14 | Ht 70.5 in | Wt 176.6 lb

## 2020-02-12 DIAGNOSIS — I129 Hypertensive chronic kidney disease with stage 1 through stage 4 chronic kidney disease, or unspecified chronic kidney disease: Secondary | ICD-10-CM

## 2020-02-12 DIAGNOSIS — I10 Essential (primary) hypertension: Secondary | ICD-10-CM

## 2020-02-12 DIAGNOSIS — R351 Nocturia: Secondary | ICD-10-CM

## 2020-02-12 DIAGNOSIS — K219 Gastro-esophageal reflux disease without esophagitis: Secondary | ICD-10-CM | POA: Diagnosis not present

## 2020-02-12 DIAGNOSIS — E559 Vitamin D deficiency, unspecified: Secondary | ICD-10-CM

## 2020-02-12 DIAGNOSIS — E785 Hyperlipidemia, unspecified: Secondary | ICD-10-CM | POA: Diagnosis not present

## 2020-02-12 DIAGNOSIS — I252 Old myocardial infarction: Secondary | ICD-10-CM | POA: Diagnosis not present

## 2020-02-12 DIAGNOSIS — D692 Other nonthrombocytopenic purpura: Secondary | ICD-10-CM

## 2020-02-12 DIAGNOSIS — R809 Proteinuria, unspecified: Secondary | ICD-10-CM | POA: Diagnosis not present

## 2020-02-12 DIAGNOSIS — I739 Peripheral vascular disease, unspecified: Secondary | ICD-10-CM | POA: Diagnosis not present

## 2020-02-12 DIAGNOSIS — M109 Gout, unspecified: Secondary | ICD-10-CM

## 2020-02-12 DIAGNOSIS — E1129 Type 2 diabetes mellitus with other diabetic kidney complication: Secondary | ICD-10-CM | POA: Diagnosis not present

## 2020-02-12 DIAGNOSIS — Z794 Long term (current) use of insulin: Secondary | ICD-10-CM | POA: Diagnosis not present

## 2020-02-12 DIAGNOSIS — E1122 Type 2 diabetes mellitus with diabetic chronic kidney disease: Secondary | ICD-10-CM

## 2020-02-12 DIAGNOSIS — E1169 Type 2 diabetes mellitus with other specified complication: Secondary | ICD-10-CM

## 2020-02-12 DIAGNOSIS — N183 Chronic kidney disease, stage 3 unspecified: Secondary | ICD-10-CM | POA: Diagnosis not present

## 2020-02-12 DIAGNOSIS — N401 Enlarged prostate with lower urinary tract symptoms: Secondary | ICD-10-CM | POA: Diagnosis not present

## 2020-02-12 DIAGNOSIS — D638 Anemia in other chronic diseases classified elsewhere: Secondary | ICD-10-CM

## 2020-02-12 LAB — POCT GLYCOSYLATED HEMOGLOBIN (HGB A1C): Hemoglobin A1C: 6.6 % — AB (ref 4.0–5.6)

## 2020-02-12 MED ORDER — LANTUS SOLOSTAR 100 UNIT/ML ~~LOC~~ SOPN: 46.0000 [IU] | PEN_INJECTOR | Freq: Every day | SUBCUTANEOUS | 0 refills | Status: DC

## 2020-02-12 MED ORDER — METFORMIN HCL 850 MG PO TABS: 850.0000 mg | ORAL_TABLET | Freq: Two times a day (BID) | ORAL | 1 refills | Status: DC

## 2020-02-12 MED ORDER — OMEPRAZOLE 40 MG PO CPDR: 40.0000 mg | DELAYED_RELEASE_CAPSULE | Freq: Every day | ORAL | 0 refills | Status: DC

## 2020-02-12 NOTE — Progress Notes (Signed)
Name: Tracy [name]   MRN: [id number]    DOB: Feb 08, 1930   Date:02/12/2020       Progress Note  Subjective  Chief Complaint  Chief Complaint  Patient presents with   Follow-up   Diabetes    Type 2    Hypertension    HPI  Claudication: he states he has noticed aching sensation on his thighs/feels tired when playing golf and unable to play 18 holes, he is back at the gym and has been doing better since riding the bike four times a week.   He also noticed that soreness improves when he rests.   GERD: we switched from Pepcid to PPI back in March, he is doing well now, no heartburn or indigestion.   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 He has been doing well since, denies chest pain, SOB or orthopnea.He denies side effects of medication He has been physically active and is doing well.   DMII: he sees Dr. Kenton Kingfisher at [facility] a recent visit and was taken off Glipizide and started on Jardiance 10 mg also on Lantus 46 units and Metformin twice daly, his A1C went from 7.1% to 7.2 % to 6.6 % today, explained goal for him is around 7 . He denies hypoglycemic episodes, glucose has been in the 120's fasting.   Hyperlipidemia: taking medication and denies side effects, LDL is at goal. No myalgia.He has leg cramps at night, usually when he does not drink enough water   HTN: under control with medication, no chest pain or palpitation, no dizziness. Unchanged   Hearing loss sudden, happened after an [place] in Dec 2020seen by Dr. Tami Ribas and had steroid and is using a hearing aid. Had PT , he is doing better but still not back to normal. Stable   Osteoarthritis: he has OA of left thumb, intermittent medial knee pain when moving/walking but responding to topical medication  No swelling, no redness or increase in warmth.   Senile purpura: on left arm, discussed benign , gave him reassurance   History of skin cancer:  recently seen by dermatologist and had another incision done.   Anemia of chronic disease/CKI stage III: reviewed last labs, urine micro 56, he is on lisinopril, he denies pruritus or decrease in urinary output   BPH: sees Urologist, no nocturia, symptoms controlled    Patient Active Problem List   Diagnosis Date Noted   Anemia of chronic disease 11/12/2019   Hearing loss on right 11/26/2018   Labyrinthitis of right ear 10/25/2018   Spinal stenosis of lumbar region 09/13/2017   Spinal stenosis of lumbar region with neurogenic claudication 05/03/2017   Type 2 diabetes mellitus without complication, with long-term current use of insulin ([place]) 05/03/2017   Left hip pain 07/28/2016   ALT (SGPT) level raised 07/13/2015   At risk for falling 07/13/2015   Arthritis of hand, degenerative 07/13/2015   Gout 07/13/2015   Glaucoma 07/13/2015   Diabetic neuropathy ([place]) 07/13/2015   Hypertension 04/09/2015   Type 2 diabetes mellitus with microalbuminuria, with long-term current use of insulin ([place]) 04/09/2015   Dyslipidemia 04/09/2015   Hypertriglyceridemia 04/09/2015   Benign fibroma of prostate 04/09/2015   Acid reflux 04/09/2015   History of melanoma excision 04/09/2015   History of squamous cell carcinoma 05/23/2013   H/O adenomatous polyp of colon 08/25/2010   History of MI (myocardial infarction) 08/27/1979    Past Surgical History:  Procedure Laterality  Date   CATARACT EXTRACTION W/ INTRAOCULAR LENS  IMPLANT, BILATERAL     CIRCUMCISION     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/10/2019   GANGLION CYST EXCISION     LEFT HAND   LUMBAR LAMINECTOMY/DECOMPRESSION MICRODISCECTOMY  09/13/2017   Procedure: BILATERAL Lumbar three-four , Left  Lumbar four-five  AND Left  L5-S1, LAMINECTOMY/FORAMINOTOMY;  Surgeon: [name], MD;  Location: [facility];  Service: Neurosurgery;;   MELANOMA EXCISION  07/2011   TONSILLECTOMY     VASECTOMY      Family History  Problem  Relation Age of Onset   Heart disease Mother    Diabetes Mother        type 2   Angina Mother    Heart disease Brother    Pneumonia Father    Heart attack Father    Alcoholism Daughter    Cirrhosis Daughter     Social History   Tobacco Use   Smoking status: Former Smoker    Packs/day: 2.00    Years: 13.00    Pack years: 26.00    Types: Cigarettes    Quit date: 1963    Years since quitting: 58.4   Smokeless tobacco: Never Used   Tobacco comment: smoking cessation materials not required  Substance Use Topics   Alcohol use: Yes    Alcohol/week: 2.0 standard drinks    Types: 2 Shots of liquor per week    Comment: occasional     Current Outpatient Medications:    ACCU-CHEK FASTCLIX LANCETS MISC, CHECK  BLOOD  SUGAR TWICE DAILY, Disp: 204 each, Rfl: 1   allopurinol (ZYLOPRIM) 100 MG tablet, Take 1 tablet (100 mg total) by mouth 2 (two) times daily., Disp: 180 tablet, Rfl: 1   amLODipine (NORVASC) 5 MG tablet, Take 2 tablets (10 mg total) by mouth daily., Disp: 180 tablet, Rfl: 1   aspirin 81 MG tablet, Take 81 mg by mouth., Disp: , Rfl:    atorvastatin (LIPITOR) 10 MG tablet, TAKE 1 TABLET DAILY AT 6 PM., Disp: 90 tablet, Rfl: 1   Blood Glucose Monitoring Suppl (ACCU-CHEK NANO SMARTVIEW) W/DEVICE KIT, USE AS DIRECTED, Disp: 1 kit, Rfl: 0   Cholecalciferol (VITAMIN D) 2000 units tablet, Take 2,000 Units by mouth daily., Disp: , Rfl:    dorzolamide-timolol (COSOPT) 22.3-6.8 MG/ML ophthalmic solution, Place 1 drop into the right eye 2 (two) times daily., Disp: , Rfl:    doxazosin (CARDURA) 4 MG tablet, Take 1 tablet (4 mg total) by mouth at bedtime., Disp: 90 tablet, Rfl: 3   finasteride (PROSCAR) 5 MG tablet, Take 1 tablet (5 mg total) by mouth at bedtime., Disp: 30 tablet, Rfl: 0   glucose blood (COOL BLOOD GLUCOSE TEST STRIPS) test strip, Use as instructed, Disp: 100 each, Rfl: 12   insulin glargine (LANTUS SOLOSTAR) 100 UNIT/ML Solostar Pen, Inject 46  Units into the skin daily., Disp: 30 mL, Rfl: 0   Insulin Pen Needle (B-D ULTRAFINE III SHORT PEN) 31G X 8 MM MISC, USE AS DIRECTED WITH INSULIN PEN., Disp: 100 each, Rfl: 5   JARDIANCE 10 MG TABS tablet, Take 10 mg by mouth daily., Disp: , Rfl:    lisinopril (ZESTRIL) 40 MG tablet, Take 1 tablet (40 mg total) by mouth daily., Disp: 90 tablet, Rfl: 1   metFORMIN (GLUCOPHAGE) 850 MG tablet, Take 1 tablet (850 mg total) by mouth 2 (two) times daily with a meal., Disp: 180 tablet, Rfl: 1   Multiple Vitamins-Minerals (CENTRUM SILVER 50+MEN) TABS, Take 1 tablet by  mouth daily., Disp: , Rfl:    NEEDLE, DISP, 14 G 14G X 1" MISC, , Disp: , Rfl:    omega-3 fish oil (MAXEPA) 1000 MG CAPS capsule, Take 2 capsules by mouth daily. , Disp: , Rfl:    omeprazole (PRILOSEC) 40 MG capsule, Take 1 capsule (40 mg total) by mouth daily., Disp: 90 capsule, Rfl: 0   vitamin B-12 (CYANOCOBALAMIN) 500 MCG tablet, Take 1 tablet by mouth daily., Disp: , Rfl:    levocetirizine (XYZAL) 5 MG tablet, Take 1 tablet (5 mg total) by mouth every evening. (Patient not taking: Reported on 02/12/2020), Disp: 30 tablet, Rfl: 0  Allergies  Allergen Reactions   Tetracyclines & Related Other (See Comments)    UNSPECIFIED REACTION  other    I personally reviewed active problem list, medication list, allergies, family history, social history, health maintenance with the patient/caregiver today.   ROS  Constitutional: Negative for fever or weight change.  Respiratory: Negative for cough and shortness of breath.   Cardiovascular: Negative for chest pain or palpitations.  Gastrointestinal: Negative for abdominal pain, no bowel changes.  Musculoskeletal: Negative for gait problem or joint swelling.  Skin: Negative for rash.  Neurological: Negative for dizziness or headache.  No other specific complaints in a complete review of systems (except as listed in HPI above).  Objective  Vitals:   02/12/20 0927  BP: 126/62   Pulse: 79  Resp: 14  Temp: 97.8 F (36.6 C)  TempSrc: Temporal  SpO2: 97%  Weight: 176 lb 9.6 oz (80.1 kg)  Height: 5' 10.5" (1.791 m)    Body mass index is 24.98 kg/m.  Physical Exam  Constitutional: Patient appears well-developed and well-nourished.  No distress.  HEENT: head atraumatic, normocephalic, pupils equal and reactive to light,  neck supple Cardiovascular: Normal rate, regular rhythm and normal heart sounds.  No murmur heard. No BLE edema. Pulmonary/Chest: Effort normal and breath sounds normal. No respiratory distress. Abdominal: Soft.  There is no tenderness. Psychiatric: Patient has a normal mood and affect. behavior is normal. Judgment and thought content normal. Skin: senile on left arm  PHQ2/9: Depression screen [facility] 2/9 02/12/2020 11/14/2019 11/12/2019 07/09/2019 04/05/2019  Decreased Interest 0 0 0 0 0  Down, Depressed, Hopeless 0 0 0 0 0  PHQ - 2 Score 0 0 0 0 0  Altered sleeping 0 - 0 1 0  Tired, decreased energy 0 - 0 2 3  Change in appetite 0 - 0 0 0  Feeling bad or failure about yourself  0 - 0 0 0  Trouble concentrating 0 - 0 0 0  Moving slowly or fidgety/restless 0 - 0 0 0  Suicidal thoughts 0 - 0 0 0  PHQ-9 Score 0 - 0 3 3  Difficult doing work/chores Not difficult at all - Not difficult at all Not difficult at all Not difficult at all  Some recent data might be hidden    phq 9 is negative    Fall Risk: Fall Risk  02/12/2020 11/14/2019 11/12/2019 07/09/2019 04/05/2019  Falls in the past year? 0 0 0 0 0  Number falls in past yr: - 0 0 0 0  Injury with Fall? - 0 0 0 0  Risk for fall due to : - No Fall Risks - - -  Risk for fall due to: Comment - - - - -  Follow up - Falls prevention discussed Falls evaluation completed - -     Functional Status Survey: Is the patient deaf  or have difficulty hearing?: Yes(Right ear (deaf due to past virus)) Does the patient have difficulty seeing, even when wearing glasses/contacts?: No Does the patient have  difficulty concentrating, remembering, or making decisions?: No Does the patient have difficulty walking or climbing stairs?: No Does the patient have difficulty dressing or bathing?: No Does the patient have difficulty doing errands alone such as visiting a doctor's office or shopping?: No    Assessment & Plan  1. Controlled type 2 diabetes mellitus with microalbuminuria, with long-term current use of insulin (HCC)  - POCT HgB A1C - metFORMIN (GLUCOPHAGE) 850 MG tablet; Take 1 tablet (850 mg total) by mouth 2 (two) times daily with a meal.  Dispense: 180 tablet; Refill: 1  2. Claudication ([place])  On aspirin   3. History of MI (myocardial infarction)  On aspirin, statin therapy and ACE  4. Essential hypertension  At goal   5. Dyslipidemia  Continue statin therapy   6. GERD without esophagitis  - omeprazole (PRILOSEC) 40 MG capsule; Take 1 capsule (40 mg total) by mouth daily.  Dispense: 90 capsule; Refill: 0  7. Vitamin D deficiency   8. BPH associated with nocturia   9. Anemia of chronic disease  Stable   10. Controlled gout  No recent episodes of gout   11. Dyslipidemia associated with type 2 diabetes mellitus (HCC)  On statin therapy  - insulin glargine (LANTUS SOLOSTAR) 100 UNIT/ML Solostar Pen; Inject 46 Units into the skin daily.  Dispense: 30 mL; Refill: 0   12. Senile purpura ([place])  Reassurance   13. Type 2 diabetes mellitus with stage 3 chronic kidney disease and hypertension ([place])  Under control, monitor kidney function, reminded him not to take NSAID's

## 2020-03-26 NOTE — Progress Notes (Signed)
 03/27/2020 10:11 AM   Tracy [name] 10/24/1929 [id number]  Referring provider: Sowles, Krichna, MD [address] [place],  [place] [address] Chief Complaint  Patient presents with  . Benign Prostatic Hypertrophy   Urologic history: 1.  BPH with lower urinary tract symptoms             -Episode urinary retention after spine surgery 09/2017; resolved             -Combination therapy doxazosin/finasteride  HPI: Tracy [name] is a 86 y.o. male with BPH and lower urinary tract symptoms who returns for a 1 year follow up.   -The patient is doing well and has no complaints -No bothersome LUTS -He remains on doxazosin and finasteride -Denies dysuria, gross hematuria -Denies flank, abdominal or pelvic pain -PVR is 6 mL.    PMH: Past Medical History:  Diagnosis Date  . Arthritis   . Cancer (HCC)    SKIN CANCER melanoma 2 sites and multiple squamous cell  . Diabetes mellitus without complication (HCC)   . GERD (gastroesophageal reflux disease)   . Glaucoma   . Hyperlipidemia   . Hypertension   . Myocardial infarction (HCC)    08/27/1979  . Vertigo     Surgical History: Past Surgical History:  Procedure Laterality Date  . CATARACT EXTRACTION W/ INTRAOCULAR LENS  IMPLANT, BILATERAL    . CIRCUMCISION    . 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/10/2019  . GANGLION CYST EXCISION     LEFT HAND  . LUMBAR LAMINECTOMY/DECOMPRESSION MICRODISCECTOMY  09/13/2017   Procedure: BILATERAL Lumbar three-four , Left  Lumbar four-five  AND Left  L5-S1, LAMINECTOMY/FORAMINOTOMY;  Surgeon: Cram, Gary, MD;  Location: [facility];  Service: Neurosurgery;;  . MELANOMA EXCISION  07/2011  . TONSILLECTOMY    . VASECTOMY      Home Medications:  Allergies as of 03/27/2020      Reactions   Tetracyclines & Related Other (See Comments)   UNSPECIFIED REACTION  other      Medication List       Accurate as of March 27, 2020 10:11 AM. If you have any questions, ask your nurse or doctor.          Accu-Chek FastClix Lancets Misc CHECK  BLOOD  SUGAR TWICE DAILY   Accu-Chek Nano SmartView w/Device Kit USE AS DIRECTED   allopurinol 100 MG tablet Commonly known as: ZYLOPRIM Take 1 tablet (100 mg total) by mouth 2 (two) times daily.   amLODipine 5 MG tablet Commonly known as: NORVASC Take 2 tablets (10 mg total) by mouth daily.   aspirin 81 MG tablet Take 81 mg by mouth.   atorvastatin 10 MG tablet Commonly known as: LIPITOR TAKE 1 TABLET DAILY AT 6 PM.   Centrum Silver 50+Men Tabs Take 1 tablet by mouth daily.   dorzolamide-timolol 22.3-6.8 MG/ML ophthalmic solution Commonly known as: COSOPT Place 1 drop into the right eye 2 (two) times daily.   doxazosin 4 MG tablet Commonly known as: CARDURA Take 1 tablet (4 mg total) by mouth at bedtime.   finasteride 5 MG tablet Commonly known as: PROSCAR Take 1 tablet (5 mg total) by mouth at bedtime.   glucose blood test strip Commonly known as: Cool Blood Glucose Test Strips Use as instructed   Insulin Pen Needle 31G X 8 MM Misc Commonly known as: B-D ULTRAFINE III SHORT PEN USE AS DIRECTED WITH INSULIN PEN.   Jardiance 10 MG Tabs tablet Generic drug: empagliflozin Take 10 mg   03/27/2020 10:11 AM   [name] [birth year] [id number]  Referring provider: Steele Sizer, MD [address] [place],  [place] [address] Chief Complaint  Patient presents with  . Benign Prostatic Hypertrophy   Urologic history: 1.  BPH with lower urinary tract symptoms             -Episode urinary retention after spine surgery 09/2017; resolved             -Combination therapy doxazosin/finasteride  HPI: Tracy [name] is a 86 y.o. male with BPH and lower urinary tract symptoms who returns for a 1 year follow up.   -The patient is doing well and has no complaints -No bothersome LUTS -He remains on doxazosin and finasteride -Denies dysuria, gross hematuria -Denies flank, abdominal or pelvic pain -PVR is 6 mL.    PMH: Past Medical History:  Diagnosis Date  . Arthritis   . Cancer ([place])    SKIN CANCER melanoma 2 sites and multiple squamous cell  . Diabetes mellitus without complication ([place])   . GERD (gastroesophageal reflux disease)   . Glaucoma   . Hyperlipidemia   . Hypertension   . Myocardial infarction ([place])    08/27/1979  . Vertigo     Surgical History: Past Surgical History:  Procedure Laterality Date  . CATARACT EXTRACTION W/ INTRAOCULAR LENS  IMPLANT, BILATERAL    . CIRCUMCISION    . 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/10/2019  . GANGLION CYST EXCISION     LEFT HAND  . LUMBAR LAMINECTOMY/DECOMPRESSION MICRODISCECTOMY  09/13/2017   Procedure: BILATERAL Lumbar three-four , Left  Lumbar four-five  AND Left  L5-S1, LAMINECTOMY/FORAMINOTOMY;  Surgeon: [name], MD;  Location: [facility];  Service: Neurosurgery;;  . MELANOMA EXCISION  07/2011  . TONSILLECTOMY    . VASECTOMY      Home Medications:  Allergies as of 03/27/2020      Reactions   Tetracyclines & Related Other (See Comments)   UNSPECIFIED REACTION  other      Medication List       Accurate as of March 27, 2020 10:11 AM. If you have any questions, ask your nurse or doctor.          Accu-Chek FastClix Lancets Misc CHECK  BLOOD  SUGAR TWICE DAILY   Accu-Chek Nano SmartView w/Device Kit USE AS DIRECTED   allopurinol 100 MG tablet Commonly known as: ZYLOPRIM Take 1 tablet (100 mg total) by mouth 2 (two) times daily.   amLODipine 5 MG tablet Commonly known as: NORVASC Take 2 tablets (10 mg total) by mouth daily.   aspirin 81 MG tablet Take 81 mg by mouth.   atorvastatin 10 MG tablet Commonly known as: LIPITOR TAKE 1 TABLET DAILY AT 6 PM.   Centrum Silver 50+Men Tabs Take 1 tablet by mouth daily.   dorzolamide-timolol 22.3-6.8 MG/ML ophthalmic solution Commonly known as: COSOPT Place 1 drop into the right eye 2 (two) times daily.   doxazosin 4 MG tablet Commonly known as: CARDURA Take 1 tablet (4 mg total) by mouth at bedtime.   finasteride 5 MG tablet Commonly known as: PROSCAR Take 1 tablet (5 mg total) by mouth at bedtime.   glucose blood test strip Commonly known as: Cool Blood Glucose Test Strips Use as instructed   Insulin Pen Needle 31G X 8 MM Misc Commonly known as: B-D ULTRAFINE III SHORT PEN USE AS DIRECTED WITH INSULIN PEN.   Jardiance 10 MG Tabs tablet Generic drug: empagliflozin Take 10 mg

## 2020-03-27 ENCOUNTER — Encounter: Payer: Self-pay | Admitting: Urology

## 2020-03-27 ENCOUNTER — Other Ambulatory Visit: Payer: Self-pay

## 2020-03-27 ENCOUNTER — Ambulatory Visit (INDEPENDENT_AMBULATORY_CARE_PROVIDER_SITE_OTHER): Payer: Medicare HMO | Admitting: Urology

## 2020-03-27 VITALS — BP 123/69 | HR 68 | Ht 70.0 in | Wt 174.0 lb

## 2020-03-27 DIAGNOSIS — R339 Retention of urine, unspecified: Secondary | ICD-10-CM

## 2020-03-27 DIAGNOSIS — N401 Enlarged prostate with lower urinary tract symptoms: Secondary | ICD-10-CM | POA: Diagnosis not present

## 2020-03-27 DIAGNOSIS — N138 Other obstructive and reflux uropathy: Secondary | ICD-10-CM

## 2020-03-27 LAB — BLADDER SCAN AMB NON-IMAGING: SCA Result: 6

## 2020-03-27 MED ORDER — DOXAZOSIN MESYLATE 4 MG PO TABS: 4.0000 mg | ORAL_TABLET | Freq: Every day | ORAL | 3 refills | Status: DC

## 2020-03-27 MED ORDER — FINASTERIDE 5 MG PO TABS: 5.0000 mg | ORAL_TABLET | Freq: Every day | ORAL | 3 refills | Status: DC

## 2020-03-30 DIAGNOSIS — H6123 Impacted cerumen, bilateral: Secondary | ICD-10-CM | POA: Diagnosis not present

## 2020-03-30 DIAGNOSIS — H903 Sensorineural hearing loss, bilateral: Secondary | ICD-10-CM | POA: Diagnosis not present

## 2020-04-06 DIAGNOSIS — Z947 Corneal transplant status: Secondary | ICD-10-CM | POA: Diagnosis not present

## 2020-04-13 DIAGNOSIS — D485 Neoplasm of uncertain behavior of skin: Secondary | ICD-10-CM | POA: Diagnosis not present

## 2020-04-13 DIAGNOSIS — D2272 Melanocytic nevi of left lower limb, including hip: Secondary | ICD-10-CM | POA: Diagnosis not present

## 2020-04-13 DIAGNOSIS — C44629 Squamous cell carcinoma of skin of left upper limb, including shoulder: Secondary | ICD-10-CM | POA: Diagnosis not present

## 2020-04-13 DIAGNOSIS — Z85828 Personal history of other malignant neoplasm of skin: Secondary | ICD-10-CM | POA: Diagnosis not present

## 2020-04-13 DIAGNOSIS — D2261 Melanocytic nevi of right upper limb, including shoulder: Secondary | ICD-10-CM | POA: Diagnosis not present

## 2020-04-13 DIAGNOSIS — Z85820 Personal history of malignant melanoma of skin: Secondary | ICD-10-CM | POA: Diagnosis not present

## 2020-04-13 DIAGNOSIS — L570 Actinic keratosis: Secondary | ICD-10-CM | POA: Diagnosis not present

## 2020-04-13 DIAGNOSIS — D2262 Melanocytic nevi of left upper limb, including shoulder: Secondary | ICD-10-CM | POA: Diagnosis not present

## 2020-04-13 DIAGNOSIS — X32XXXA Exposure to sunlight, initial encounter: Secondary | ICD-10-CM | POA: Diagnosis not present

## 2020-04-13 DIAGNOSIS — D225 Melanocytic nevi of trunk: Secondary | ICD-10-CM | POA: Diagnosis not present

## 2020-04-23 ENCOUNTER — Other Ambulatory Visit: Payer: Self-pay | Admitting: Urology

## 2020-04-23 DIAGNOSIS — R339 Retention of urine, unspecified: Secondary | ICD-10-CM

## 2020-05-04 ENCOUNTER — Other Ambulatory Visit: Payer: Self-pay | Admitting: Family Medicine

## 2020-05-04 DIAGNOSIS — K219 Gastro-esophageal reflux disease without esophagitis: Secondary | ICD-10-CM

## 2020-05-08 DIAGNOSIS — C44629 Squamous cell carcinoma of skin of left upper limb, including shoulder: Secondary | ICD-10-CM | POA: Diagnosis not present

## 2020-05-14 DIAGNOSIS — Z48817 Encounter for surgical aftercare following surgery on the skin and subcutaneous tissue: Secondary | ICD-10-CM | POA: Diagnosis not present

## 2020-06-15 NOTE — Progress Notes (Signed)
Name: Tracy [name]   MRN: [id number]    DOB: 06-Oct-1929   Date:06/17/2020       Progress Note  Subjective  Chief Complaint  Chief Complaint  Patient presents with  . Diabetes  . Hypertension  . Hyperlipidemia  . Medication Refill    HPI  Claudication: he states he has noticed aching sensation on his thighs/feels tired when playing golf and unable to play 18 holes,he is back at the gym and has been doing better , he has been riding an stationary bike and feels like it is improving symptoms, he is still walking in am's for 25-30 minutes   GERD: we switched from Pepcid to PPI back in March, he is doing well now, no heartburn or indigestion. 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 He has been doing well since, denies chest pain, SOB or orthopnea.Denies decrease in exercise tolerance   DMII: he sees Dr. Kenton Kingfisher at [facility] a recent visit and was taken off Glipizide and started on Jardiance 10 mg also on Lantus 46 units and Metformin 850 mg twice daly, his A1C went from 7.1% to 7.2 % to 6.6 % and today is 6.5 % , we will adjust dose of Metformin to 500 mg ER and start titrating lantus by 2 units every three days to keep glucose at goal. He still has episodes of hypoglycemia   Hyperlipidemia: taking medication and denies side effects, LDL is at goal. No myalgia.He has leg cramps at night, usually when he does not drink enough water Unchanged   HTN: under control with medication, no chest painor palpitation, no dizziness.Continue current regiment   Hearing loss sudden, happened after an [place] in Dec 2020seen by Dr. Tami Ribas and had steroid and is using a hearing aid. Had PT , and is stable.   Osteoarthritis: he has OA of left thumb, intermittentmedial knee pain when moving/walkingbut responding to topical medicationNo swelling, no redness or increase in warmth.  Senile purpura: still has  a spot on left arm, but stable   History of skin cancer: recently seen by dermatologist and had another incision done.   Anemia of chronic disease/CKI stage III: reviewed last labs, urine micro 56, he is on lisinopril, he denies pruritus or decrease in urinary output   BPH: sees Urologist, no nocturia, symptoms controlled , on medications     Patient Active Problem List   Diagnosis Date Noted  . Anemia of chronic disease 11/12/2019  . Hearing loss on right 11/26/2018  . Labyrinthitis of right ear 10/25/2018  . Spinal stenosis of lumbar region 09/13/2017  . Spinal stenosis of lumbar region with neurogenic claudication 05/03/2017  . Type 2 diabetes mellitus without complication, with long-term current use of insulin ([place]) 05/03/2017  . Left hip pain 07/28/2016  . ALT (SGPT) level raised 07/13/2015  . At risk for falling 07/13/2015  . Arthritis of hand, degenerative 07/13/2015  . Gout 07/13/2015  . Glaucoma 07/13/2015  . Diabetic neuropathy ([place]) 07/13/2015  . Hypertension 04/09/2015  . Type 2 diabetes mellitus with microalbuminuria, with long-term current use of insulin ([place]) 04/09/2015  . Dyslipidemia 04/09/2015  . Hypertriglyceridemia 04/09/2015  . Benign fibroma of prostate 04/09/2015  . Acid reflux 04/09/2015  . History of melanoma excision 04/09/2015  . History of squamous cell carcinoma 05/23/2013  . H/O adenomatous polyp of colon 08/25/2010  . History of MI (myocardial infarction) 08/27/1979  Past Surgical History:  Procedure Laterality Date  . CATARACT EXTRACTION W/ INTRAOCULAR LENS  IMPLANT, BILATERAL    . CIRCUMCISION    . 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/10/2019  . GANGLION CYST EXCISION     LEFT HAND  . LUMBAR LAMINECTOMY/DECOMPRESSION MICRODISCECTOMY  09/13/2017   Procedure: BILATERAL Lumbar three-four , Left  Lumbar four-five  AND Left  L5-S1, LAMINECTOMY/FORAMINOTOMY;  Surgeon: [name], MD;  Location: [facility];  Service: Neurosurgery;;  . MELANOMA EXCISION   07/2011  . TONSILLECTOMY    . VASECTOMY      Family History  Problem Relation Age of Onset  . Heart disease Mother   . Diabetes Mother        type 2  . Angina Mother   . Heart disease Brother   . Pneumonia Father   . Heart attack Father   . Alcoholism Daughter   . Cirrhosis Daughter     Social History   Tobacco Use  . Smoking status: Former Smoker    Packs/day: 2.00    Years: 13.00    Pack years: 26.00    Types: Cigarettes    Quit date: 1963    Years since quitting: 58.8  . Smokeless tobacco: Never Used  . Tobacco comment: smoking cessation materials not required  Substance Use Topics  . Alcohol use: Yes    Alcohol/week: 2.0 standard drinks    Types: 2 Shots of liquor per week    Comment: occasional     Current Outpatient Medications:  .  ACCU-CHEK FASTCLIX LANCETS MISC, CHECK  BLOOD  SUGAR TWICE DAILY, Disp: 204 each, Rfl: 1 .  allopurinol (ZYLOPRIM) 100 MG tablet, Take 1 tablet (100 mg total) by mouth 2 (two) times daily., Disp: 180 tablet, Rfl: 1 .  amLODipine (NORVASC) 5 MG tablet, Take 2 tablets (10 mg total) by mouth daily., Disp: 180 tablet, Rfl: 1 .  aspirin 81 MG tablet, Take 81 mg by mouth., Disp: , Rfl:  .  atorvastatin (LIPITOR) 10 MG tablet, TAKE 1 TABLET DAILY AT 6 PM., Disp: 90 tablet, Rfl: 1 .  Blood Glucose Monitoring Suppl (ACCU-CHEK NANO SMARTVIEW) W/DEVICE KIT, USE AS DIRECTED, Disp: 1 kit, Rfl: 0 .  Cholecalciferol (VITAMIN D) 2000 units tablet, Take 2,000 Units by mouth daily., Disp: , Rfl:  .  dorzolamide-timolol (COSOPT) 22.3-6.8 MG/ML ophthalmic solution, Place 1 drop into the right eye 2 (two) times daily., Disp: , Rfl:  .  doxazosin (CARDURA) 4 MG tablet, TAKE 1 TABLET (4 MG TOTAL) BY MOUTH AT BEDTIME., Disp: 90 tablet, Rfl: 0 .  finasteride (PROSCAR) 5 MG tablet, TAKE 1 TABLET (5 MG TOTAL) BY MOUTH AT BEDTIME., Disp: 90 tablet, Rfl: 0 .  glucose blood (COOL BLOOD GLUCOSE TEST STRIPS) test strip, Use as instructed, Disp: 100 each, Rfl: 12 .   insulin glargine (LANTUS SOLOSTAR) 100 UNIT/ML Solostar Pen, Inject 46 Units into the skin daily., Disp: 30 mL, Rfl: 0 .  Insulin Pen Needle (B-D ULTRAFINE III SHORT PEN) 31G X 8 MM MISC, USE AS DIRECTED WITH INSULIN PEN., Disp: 100 each, Rfl: 5 .  JARDIANCE 10 MG TABS tablet, Take 10 mg by mouth daily., Disp: , Rfl:  .  lisinopril (ZESTRIL) 40 MG tablet, Take 1 tablet (40 mg total) by mouth daily., Disp: 90 tablet, Rfl: 1 .  metFORMIN (GLUCOPHAGE) 850 MG tablet, Take 1 tablet (850 mg total) by mouth 2 (two) times daily with a meal., Disp: 180 tablet, Rfl: 1 .  Multiple Vitamins-Minerals (CENTRUM SILVER  50+MEN) TABS, Take 1 tablet by mouth daily., Disp: , Rfl:  .  NEEDLE, DISP, 14 G 14G X 1" MISC, , Disp: , Rfl:  .  omega-3 fish oil (MAXEPA) 1000 MG CAPS capsule, Take 2 capsules by mouth daily. , Disp: , Rfl:  .  omeprazole (PRILOSEC) 40 MG capsule, Take 1 capsule (40 mg total) by mouth daily., Disp: 90 capsule, Rfl: 1 .  vitamin B-12 (CYANOCOBALAMIN) 500 MCG tablet, Take 1 tablet by mouth daily., Disp: , Rfl:   Allergies  Allergen Reactions  . Tetracyclines & Related Other (See Comments)    UNSPECIFIED REACTION  other    I personally reviewed active problem list, medication list, allergies, family history, social history, health maintenance with the patient/caregiver today.   ROS  Constitutional: Negative for fever , positive for  weight change.  Respiratory: Negative for cough and shortness of breath.   Cardiovascular: Negative for chest pain or palpitations.  Gastrointestinal: Negative for abdominal pain, no bowel changes.  Musculoskeletal: Negative for gait problem or joint swelling.  Skin: Negative for rash.  Neurological: Negative for dizziness or headache.  No other specific complaints in a complete review of systems (except as listed in HPI above).  Objective  Vitals:   06/17/20 0952  BP: 126/74  Pulse: (!) 56  Resp: 14  Temp: 98 F (36.7 C)  SpO2: 96%  Weight: 171  lb (77.6 kg)  Height: 5' 10"  (1.778 m)    Body mass index is 24.54 kg/m.  Physical Exam  Constitutional: Patient appears well-developed and well-nourished.No distress.  HEENT: head atraumatic, normocephalic, pupils equal and reactive to light,  neck supple, Cardiovascular: Normal rate, regular rhythm and normal heart sounds.  No murmur heard. No BLE edema. Pulmonary/Chest: Effort normal and breath sounds normal. No respiratory distress. Abdominal: Soft.  There is no tenderness. Psychiatric: Patient has a normal mood and affect. behavior is normal. Judgment and thought content normal.  Recent Results (from the past 2160 hour(s))  Bladder Scan (Post Void Residual) in office     Status: None   Collection Time: 03/27/20  9:51 AM  Result Value Ref Range   SCA Result 6   POCT HgB A1C     Status: Abnormal   Collection Time: 06/17/20 10:11 AM  Result Value Ref Range   Hemoglobin A1C 6.5 (A) 4.0 - 5.6 %   HbA1c POC (<> result, manual entry)     HbA1c, POC (prediabetic range)     HbA1c, POC (controlled diabetic range)      Diabetic Foot Exam: Diabetic Foot Exam - Simple   Simple Foot Form Diabetic Foot exam was performed with the following findings: Yes 06/17/2020 10:29 AM  Visual Inspection No deformities, no ulcerations, no other skin breakdown bilaterally: Yes Sensation Testing Intact to touch and monofilament testing bilaterally: Yes Pulse Check Posterior Tibialis and Dorsalis pulse intact bilaterally: Yes Comments      PHQ2/9: Depression screen [facility] 2/9 06/17/2020 02/12/2020 11/14/2019 11/12/2019 07/09/2019  Decreased Interest 0 0 0 0 0  Down, Depressed, Hopeless 0 0 0 0 0  PHQ - 2 Score 0 0 0 0 0  Altered sleeping - 0 - 0 1  Tired, decreased energy - 0 - 0 2  Change in appetite - 0 - 0 0  Feeling bad or failure about yourself  - 0 - 0 0  Trouble concentrating - 0 - 0 0  Moving slowly or fidgety/restless - 0 - 0 0  Suicidal thoughts - 0 - 0  0  PHQ-9 Score - 0 - 0 3   Difficult doing work/chores - Not difficult at all - Not difficult at all Not difficult at all  Some recent data might be hidden    phq 9 is negative   Fall Risk: Fall Risk  06/17/2020 02/12/2020 11/14/2019 11/12/2019 07/09/2019  Falls in the past year? 0 0 0 0 0  Number falls in past yr: 0 - 0 0 0  Injury with Fall? 0 - 0 0 0  Risk for fall due to : - - No Fall Risks - -  Risk for fall due to: Comment - - - - -  Follow up - - Falls prevention discussed Falls evaluation completed -    Functional Status Survey: Is the patient deaf or have difficulty hearing?: Yes Does the patient have difficulty seeing, even when wearing glasses/contacts?: No Does the patient have difficulty concentrating, remembering, or making decisions?: No Does the patient have difficulty walking or climbing stairs?: No Does the patient have difficulty dressing or bathing?: No Does the patient have difficulty doing errands alone such as visiting a doctor's office or shopping?: No    Assessment & Plan   1. Controlled type 2 diabetes mellitus with microalbuminuria, with long-term current use of insulin (HCC)  - POCT HgB A1C We will go down on dose of metformin to one 850 mg once daily until out of medication after that 500 mg ER - metFORMIN (GLUCOPHAGE XR) 500 MG 24 hr tablet; Take 1 tablet (500 mg total) by mouth daily with breakfast.  Dispense: 90 tablet; Refill: 1  2. Need for immunization against influenza  - Flu Vaccine QUAD High Dose(Fluad)  3. Essential hypertension  - amLODipine (NORVASC) 5 MG tablet; Take 2 tablets (10 mg total) by mouth daily.  Dispense: 180 tablet; Refill: 1 - lisinopril (ZESTRIL) 40 MG tablet; Take 1 tablet (40 mg total) by mouth daily.  Dispense: 90 tablet; Refill: 1  4. GERD without esophagitis  - omeprazole (PRILOSEC) 40 MG capsule; Take 1 capsule (40 mg total) by mouth daily.  Dispense: 90 capsule; Refill: 1  5. BPH associated with nocturia  Doing well on medications    6. Vitamin D deficiency   7. Dyslipidemia   8. History of MI (myocardial infarction)  Compliant with medication  9. Controlled gout   10. Senile purpura (HCC)  Stable   11. Claudication [facility])  Doing better since increased physical activity

## 2020-06-17 ENCOUNTER — Encounter: Payer: Self-pay | Admitting: Family Medicine

## 2020-06-17 ENCOUNTER — Ambulatory Visit: Payer: Medicare HMO | Admitting: Family Medicine

## 2020-06-17 ENCOUNTER — Other Ambulatory Visit: Payer: Self-pay

## 2020-06-17 VITALS — BP 126/74 | HR 56 | Temp 98.0°F | Resp 14 | Ht 70.0 in | Wt 171.0 lb

## 2020-06-17 DIAGNOSIS — E559 Vitamin D deficiency, unspecified: Secondary | ICD-10-CM

## 2020-06-17 DIAGNOSIS — Z794 Long term (current) use of insulin: Secondary | ICD-10-CM | POA: Diagnosis not present

## 2020-06-17 DIAGNOSIS — I252 Old myocardial infarction: Secondary | ICD-10-CM

## 2020-06-17 DIAGNOSIS — E1129 Type 2 diabetes mellitus with other diabetic kidney complication: Secondary | ICD-10-CM

## 2020-06-17 DIAGNOSIS — D692 Other nonthrombocytopenic purpura: Secondary | ICD-10-CM

## 2020-06-17 DIAGNOSIS — R809 Proteinuria, unspecified: Secondary | ICD-10-CM | POA: Diagnosis not present

## 2020-06-17 DIAGNOSIS — Z23 Encounter for immunization: Secondary | ICD-10-CM | POA: Diagnosis not present

## 2020-06-17 DIAGNOSIS — I739 Peripheral vascular disease, unspecified: Secondary | ICD-10-CM

## 2020-06-17 DIAGNOSIS — E785 Hyperlipidemia, unspecified: Secondary | ICD-10-CM

## 2020-06-17 DIAGNOSIS — K219 Gastro-esophageal reflux disease without esophagitis: Secondary | ICD-10-CM

## 2020-06-17 DIAGNOSIS — R351 Nocturia: Secondary | ICD-10-CM

## 2020-06-17 DIAGNOSIS — I10 Essential (primary) hypertension: Secondary | ICD-10-CM

## 2020-06-17 DIAGNOSIS — N401 Enlarged prostate with lower urinary tract symptoms: Secondary | ICD-10-CM

## 2020-06-17 DIAGNOSIS — M109 Gout, unspecified: Secondary | ICD-10-CM | POA: Diagnosis not present

## 2020-06-17 LAB — POCT GLYCOSYLATED HEMOGLOBIN (HGB A1C): Hemoglobin A1C: 6.5 % — AB (ref 4.0–5.6)

## 2020-06-17 MED ORDER — OMEPRAZOLE 40 MG PO CPDR: 40.0000 mg | DELAYED_RELEASE_CAPSULE | Freq: Every day | ORAL | 1 refills | Status: DC

## 2020-06-17 MED ORDER — LISINOPRIL 40 MG PO TABS: 40.0000 mg | ORAL_TABLET | Freq: Every day | ORAL | 1 refills | Status: DC

## 2020-06-17 MED ORDER — AMLODIPINE BESYLATE 5 MG PO TABS: 10.0000 mg | ORAL_TABLET | Freq: Every day | ORAL | 1 refills | Status: DC

## 2020-06-17 MED ORDER — METFORMIN HCL 850 MG PO TABS: 850.0000 mg | ORAL_TABLET | Freq: Two times a day (BID) | ORAL | 1 refills | Status: DC

## 2020-06-17 MED ORDER — METFORMIN HCL ER 500 MG PO TB24: 500.0000 mg | ORAL_TABLET | Freq: Every day | ORAL | 1 refills | Status: DC

## 2020-06-18 LAB — LIPID PANEL
Cholesterol: 113 mg/dL
HDL: 48 mg/dL
LDL Cholesterol (Calc): 38 mg/dL
Non-HDL Cholesterol (Calc): 65 mg/dL
Total CHOL/HDL Ratio: 2.4 (calc)
Triglycerides: 203 mg/dL — ABNORMAL HIGH

## 2020-06-18 LAB — COMPLETE METABOLIC PANEL WITH GFR
AG Ratio: 1.8 (calc) (ref 1.0–2.5)
ALT: 29 U/L (ref 9–46)
AST: 24 U/L (ref 10–35)
Albumin: 4.2 g/dL (ref 3.6–5.1)
Alkaline phosphatase (APISO): 52 U/L (ref 35–144)
BUN/Creatinine Ratio: 25 (calc) — ABNORMAL HIGH (ref 6–22)
BUN: 27 mg/dL — ABNORMAL HIGH (ref 7–25)
CO2: 25 mmol/L (ref 20–32)
Calcium: 9.8 mg/dL (ref 8.6–10.3)
Chloride: 104 mmol/L (ref 98–110)
Creat: 1.1 mg/dL (ref 0.70–1.11)
GFR, Est African American: 70 mL/min/{1.73_m2} (ref >=60)
GFR, Est Non African American: 60 mL/min/{1.73_m2} (ref >=60)
Globulin: 2.4 g/dL (calc) (ref 1.9–3.7)
Glucose, Bld: 153 mg/dL — ABNORMAL HIGH (ref 65–99)
Potassium: 5 mmol/L (ref 3.5–5.3)
Sodium: 139 mmol/L (ref 135–146)
Total Bilirubin: 0.6 mg/dL (ref 0.2–1.2)
Total Protein: 6.6 g/dL (ref 6.1–8.1)

## 2020-07-24 ENCOUNTER — Other Ambulatory Visit: Payer: Self-pay

## 2020-07-24 ENCOUNTER — Telehealth: Payer: Medicare HMO | Admitting: Family Medicine

## 2020-07-27 ENCOUNTER — Other Ambulatory Visit: Payer: Self-pay

## 2020-07-27 MED ORDER — ACCU-CHEK AVIVA VI SOLN
1.0000 | 2 refills | Status: DC | PRN
Start: 2020-07-27 — End: 2020-07-31

## 2020-07-27 MED ORDER — ACCU-CHEK FASTCLIX LANCETS MISC
1 refills | Status: DC
Start: 2020-07-27 — End: 2020-07-28

## 2020-07-28 ENCOUNTER — Other Ambulatory Visit: Payer: Self-pay

## 2020-07-28 MED ORDER — ACCU-CHEK FASTCLIX LANCETS MISC
1 refills | Status: DC
Start: 2020-07-28 — End: 2020-07-31

## 2020-07-31 ENCOUNTER — Other Ambulatory Visit: Payer: Self-pay

## 2020-07-31 MED ORDER — ACCU-CHEK AVIVA VI SOLN
1.0000 | 2 refills | Status: DC | PRN
Start: 2020-07-31 — End: 2021-12-14

## 2020-07-31 MED ORDER — ACCU-CHEK FASTCLIX LANCETS MISC
1 refills | Status: DC
Start: 2020-07-31 — End: 2020-08-11

## 2020-08-03 ENCOUNTER — Other Ambulatory Visit: Payer: Self-pay

## 2020-08-03 DIAGNOSIS — H6123 Impacted cerumen, bilateral: Secondary | ICD-10-CM | POA: Diagnosis not present

## 2020-08-03 DIAGNOSIS — H903 Sensorineural hearing loss, bilateral: Secondary | ICD-10-CM | POA: Diagnosis not present

## 2020-08-03 MED ORDER — INSULIN PEN NEEDLE 31G X 5 MM MISC: 1.0000 | 2 refills | Status: DC | PRN

## 2020-08-11 ENCOUNTER — Other Ambulatory Visit: Payer: Self-pay

## 2020-08-11 ENCOUNTER — Telehealth: Payer: Self-pay | Admitting: Family Medicine

## 2020-08-11 MED ORDER — ACCU-CHEK FASTCLIX LANCETS MISC
1 refills | Status: DC
Start: 2020-08-11 — End: 2020-08-19

## 2020-08-11 NOTE — Telephone Encounter (Signed)
Tracy [name] with [facility] is calling and the pt needs new rx accu check softclix lancet

## 2020-08-19 ENCOUNTER — Other Ambulatory Visit: Payer: Self-pay

## 2020-08-19 MED ORDER — ACCU-CHEK FASTCLIX LANCETS MISC
1 refills | Status: DC
Start: 2020-08-19 — End: 2020-09-10

## 2020-08-21 ENCOUNTER — Other Ambulatory Visit: Payer: Self-pay | Admitting: Urology

## 2020-08-21 ENCOUNTER — Other Ambulatory Visit: Payer: Self-pay | Admitting: Family Medicine

## 2020-08-21 DIAGNOSIS — K219 Gastro-esophageal reflux disease without esophagitis: Secondary | ICD-10-CM

## 2020-08-21 DIAGNOSIS — I10 Essential (primary) hypertension: Secondary | ICD-10-CM

## 2020-08-21 DIAGNOSIS — R339 Retention of urine, unspecified: Secondary | ICD-10-CM

## 2020-08-24 ENCOUNTER — Telehealth: Payer: Self-pay | Admitting: Urology

## 2020-08-24 DIAGNOSIS — R339 Retention of urine, unspecified: Secondary | ICD-10-CM

## 2020-08-24 MED ORDER — FINASTERIDE 5 MG PO TABS: 5.0000 mg | ORAL_TABLET | Freq: Every day | ORAL | 3 refills | Status: DC

## 2020-08-24 MED ORDER — DOXAZOSIN MESYLATE 4 MG PO TABS: 4.0000 mg | ORAL_TABLET | Freq: Every day | ORAL | 3 refills | Status: DC

## 2020-08-24 NOTE — Telephone Encounter (Signed)
Tracy [name]'s pharmacy is requesting refills on his doxazosin and finasteride, but Dr. Bernardo Heater has released him from our care if his PCP, Dr. Ancil Boozer, is willing to refill those medications.  Would you find out if Tracy [name] has spoken to Dr. Ancil Boozer concerning this or does he want to continue to see Dr. Bernardo Heater?

## 2020-08-24 NOTE — Telephone Encounter (Signed)
Spoke to patient and he wants [facility] to refill the medications now. He was told he could get refills from [facility] and only needs to come in as needed.

## 2020-08-31 DIAGNOSIS — Z947 Corneal transplant status: Secondary | ICD-10-CM | POA: Diagnosis not present

## 2020-09-08 ENCOUNTER — Telehealth: Payer: Self-pay

## 2020-09-08 ENCOUNTER — Other Ambulatory Visit: Payer: Self-pay | Admitting: *Deleted

## 2020-09-08 DIAGNOSIS — R339 Retention of urine, unspecified: Secondary | ICD-10-CM

## 2020-09-08 MED ORDER — DOXAZOSIN MESYLATE 4 MG PO TABS: 4.0000 mg | ORAL_TABLET | Freq: Every day | ORAL | 3 refills | Status: DC

## 2020-09-08 MED ORDER — FINASTERIDE 5 MG PO TABS: 5.0000 mg | ORAL_TABLET | Freq: Every day | ORAL | 3 refills | Status: DC

## 2020-09-08 NOTE — Telephone Encounter (Signed)
Copied from CRM [id number]. Topic: General - Other >> Sep 08, 2020  8:49 AM Jaquita Rector A wrote: Reason for CRM: Patient called in to inquire of Dr Carlynn Purl about the lancet device for his Accu-Chek FastClix Lancets to prick his finger to check his blood sugar. Per patient the device broke and he need another one would like an Rx sent to [facility] please ASAP. Can be reached at Ph# [phone]

## 2020-09-10 ENCOUNTER — Other Ambulatory Visit: Payer: Self-pay

## 2020-09-10 DIAGNOSIS — D485 Neoplasm of uncertain behavior of skin: Secondary | ICD-10-CM | POA: Diagnosis not present

## 2020-09-10 DIAGNOSIS — L570 Actinic keratosis: Secondary | ICD-10-CM | POA: Diagnosis not present

## 2020-09-10 MED ORDER — ACCU-CHEK FASTCLIX LANCETS MISC: 1 refills | Status: AC

## 2020-09-14 ENCOUNTER — Other Ambulatory Visit: Payer: Self-pay | Admitting: Family Medicine

## 2020-09-14 DIAGNOSIS — L570 Actinic keratosis: Secondary | ICD-10-CM | POA: Diagnosis not present

## 2020-09-28 ENCOUNTER — Other Ambulatory Visit: Payer: Self-pay | Admitting: Family Medicine

## 2020-09-28 DIAGNOSIS — E785 Hyperlipidemia, unspecified: Secondary | ICD-10-CM

## 2020-10-06 ENCOUNTER — Telehealth: Payer: Self-pay | Admitting: Family Medicine

## 2020-10-06 NOTE — Telephone Encounter (Signed)
Tracy [name], from [facility], calling and is needing to have clarification on the pts dorzolamide/ timolol. She states that there were two prescriptions sent over for this medication and they are not sure which instructions to follow. Please advise.

## 2020-10-07 NOTE — Telephone Encounter (Signed)
Spoke to [facility] and had them cancel the prescription and get from opthalmologist.

## 2020-10-16 NOTE — Progress Notes (Signed)
Name: Tracy [name]   MRN: [id number]    DOB: 1929/11/07   Date:10/19/2020       Progress Note  Subjective  Chief Complaint  Follow Up  HPI  Claudication: he states he has noticed aching sensation on his thighs/feels tired when playing golf and unable to play 18 holes. He is walking on the treadmill for 30 minutes and also riding stationary bike for about 20 minutes daily , he has recently noticed that his toes have been cold in the morning and feels tingling. Discussed vascular studies   GERD: we switched from Pepcid to PPI back in March 2021 , he is doing well now, no heartburn or indigestion. 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 He has been doing well since, denies chest pain, SOB or orthopnea.Denies decrease in exercise tolerance, he has been going to the gym 3-5 days a week    DMII: he sees Dr. Kenton Kingfisher at [facility], he is on Metformin 500 mg ER, Jardiance and also Lantus at 42 units daily , he denies any recent episodes of low sugar , he states highest glucose over the past month was 160, after he ate pasta. Discussed yearly eye exam. He has noticed cold feet and also numbness on feet in am's. He has CKI and dyslipidemia  Hyperlipidemia: taking medication and denies side effects, LDL is at goal. No myalgia.He has leg cramps at night but very seldom .   HTN: under control with medication, no chest painor palpitation, no dizziness.Unchanged   Hearing loss sudden, happened after an [place] in Dec 2020seen by Dr. Tami Ribas and had steroid and is using a hearing aid. Had PT without improvement   Osteoarthritis: he has OA of left thumb, intermittentmedial knee pain when moving/walkingbut responding to topical medicationStable, pain only present when it rains   Senile purpura: on arms, unchanged   History of skin cancer: recently seen by dermatologist and had  another incision done.   Anemia of chronic disease/CKI stage III: reviewed last labs, urine micro 56, he is on lisinopril, he denies pruritus or decrease in urinary output . Unchanged   BPH: sees Urologist, no nocturia, symptoms controlled , on medications   Patient Active Problem List   Diagnosis Date Noted  . Chronic ischemic heart disease 10/19/2020  . Anemia of chronic disease 11/12/2019  . Hearing loss on right 11/26/2018  . Labyrinthitis of right ear 10/25/2018  . Spinal stenosis of lumbar region 09/13/2017  . Spinal stenosis of lumbar region with neurogenic claudication 05/03/2017  . Type 2 diabetes mellitus without complication, with long-term current use of insulin ([place]) 05/03/2017  . Left hip pain 07/28/2016  . ALT (SGPT) level raised 07/13/2015  . At risk for falling 07/13/2015  . Arthritis of hand, degenerative 07/13/2015  . Gout 07/13/2015  . Glaucoma 07/13/2015  . Diabetic neuropathy ([place]) 07/13/2015  . Hypertension 04/09/2015  . Type 2 diabetes mellitus with microalbuminuria, with long-term current use of insulin ([place]) 04/09/2015  . Dyslipidemia 04/09/2015  . Hypertriglyceridemia 04/09/2015  . Benign fibroma of prostate 04/09/2015  . Acid reflux 04/09/2015  . History of melanoma excision 04/09/2015  . History of squamous cell carcinoma 05/23/2013  . H/O adenomatous polyp of colon 08/25/2010  . History of MI (myocardial infarction) 08/27/1979    Past Surgical History:  Procedure Laterality Date  . CATARACT EXTRACTION W/ INTRAOCULAR LENS  IMPLANT, BILATERAL    . CIRCUMCISION    . 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/10/2019  . GANGLION CYST EXCISION     LEFT HAND  . LUMBAR LAMINECTOMY/DECOMPRESSION MICRODISCECTOMY  09/13/2017   Procedure: BILATERAL Lumbar three-four , Left  Lumbar four-five  AND Left  L5-S1, LAMINECTOMY/FORAMINOTOMY;  Surgeon: [name], MD;  Location: [facility];  Service: Neurosurgery;;  . MELANOMA EXCISION  07/2011  . TONSILLECTOMY    . VASECTOMY       Family History  Problem Relation Age of Onset  . Heart disease Mother   . Diabetes Mother        type 2  . Angina Mother   . Heart disease Brother   . Pneumonia Father   . Heart attack Father   . Alcoholism Daughter   . Cirrhosis Daughter     Social History   Tobacco Use  . Smoking status: Former Smoker    Packs/day: 2.00    Years: 13.00    Pack years: 26.00    Types: Cigarettes    Quit date: 1963    Years since quitting: 59.1  . Smokeless tobacco: Never Used  . Tobacco comment: smoking cessation materials not required  Substance Use Topics  . Alcohol use: Yes    Alcohol/week: 2.0 standard drinks    Types: 2 Shots of liquor per week    Comment: occasional     Current Outpatient Medications:  .  Accu-Chek FastClix Lancets MISC, CHECK  BLOOD  SUGAR TWICE DAILY, Disp: 204 each, Rfl: 1 .  allopurinol (ZYLOPRIM) 100 MG tablet, Take 1 tablet (100 mg total) by mouth 2 (two) times daily., Disp: 180 tablet, Rfl: 1 .  amLODipine (NORVASC) 5 MG tablet, TAKE 2 TABLETS EVERY DAY, Disp: 180 tablet, Rfl: 0 .  aspirin 81 MG tablet, Take 81 mg by mouth., Disp: , Rfl:  .  atorvastatin (LIPITOR) 10 MG tablet, TAKE 1 TABLET DAILY AT 6 PM., Disp: 90 tablet, Rfl: 0 .  Blood Glucose Calibration (ACCU-CHEK AVIVA) SOLN, 1 Bottle by In Vitro route as needed., Disp: 1 each, Rfl: 2 .  Blood Glucose Monitoring Suppl (ACCU-CHEK NANO SMARTVIEW) W/DEVICE KIT, USE AS DIRECTED, Disp: 1 kit, Rfl: 0 .  Cholecalciferol (VITAMIN D) 2000 units tablet, Take 2,000 Units by mouth daily., Disp: , Rfl:  .  dorzolamide-timolol (COSOPT) 22.3-6.8 MG/ML ophthalmic solution, Place 1 drop into the right eye 2 (two) times daily., Disp: , Rfl:  .  doxazosin (CARDURA) 4 MG tablet, Take 1 tablet (4 mg total) by mouth at bedtime., Disp: 90 tablet, Rfl: 3 .  finasteride (PROSCAR) 5 MG tablet, Take 1 tablet (5 mg total) by mouth at bedtime., Disp: 90 tablet, Rfl: 3 .  glucose blood (COOL BLOOD GLUCOSE TEST STRIPS) test  strip, Use as instructed, Disp: 100 each, Rfl: 12 .  insulin glargine (LANTUS SOLOSTAR) 100 UNIT/ML Solostar Pen, Inject 46 Units into the skin daily. (Patient taking differently: Inject 42 Units into the skin daily.), Disp: 30 mL, Rfl: 0 .  Insulin Pen Needle 31G X 5 MM MISC, 1 Device by Does not apply route as needed., Disp: 120 each, Rfl: 2 .  JARDIANCE 10 MG TABS tablet, Take 10 mg by mouth daily., Disp: , Rfl:  .  lisinopril (ZESTRIL) 40 MG tablet, Take 1 tablet (40 mg total) by mouth daily., Disp: 90 tablet, Rfl: 1 .  metFORMIN (GLUCOPHAGE XR) 500 MG 24 hr tablet, Take 1 tablet (500 mg total) by mouth daily with breakfast., Disp: 90  tablet, Rfl: 1 .  Multiple Vitamins-Minerals (CENTRUM SILVER 50+MEN) TABS, Take 1 tablet by mouth daily., Disp: , Rfl:  .  NEEDLE, DISP, 14 G 14G X 1" MISC, , Disp: , Rfl:  .  omega-3 fish oil (MAXEPA) 1000 MG CAPS capsule, Take 2 capsules by mouth daily. , Disp: , Rfl:  .  omeprazole (PRILOSEC) 40 MG capsule, Take 1 capsule (40 mg total) by mouth daily., Disp: 90 capsule, Rfl: 1 .  vitamin B-12 (CYANOCOBALAMIN) 500 MCG tablet, Take 1 tablet by mouth daily., Disp: , Rfl:   Allergies  Allergen Reactions  . Tetracyclines & Related Other (See Comments)    UNSPECIFIED REACTION  other    I personally reviewed active problem list, medication list, allergies, family history, social history, health maintenance with the patient/caregiver today.   ROS  Constitutional: Negative for fever or weight change.  Respiratory: Negative for cough and shortness of breath.   Cardiovascular: Negative for chest pain or palpitations.  Gastrointestinal: Negative for abdominal pain, no bowel changes.  Musculoskeletal: Negative for gait problem or joint swelling.  Skin: Negative for rash.  Neurological: Negative for dizziness or headache.  No other specific complaints in a complete review of systems (except as listed in HPI above).  Objective  Vitals:   10/19/20 0847  BP:  122/72  Pulse: 86  Resp: 16  Temp: 98.1 F (36.7 C)  TempSrc: Oral  SpO2: 97%  Weight: 173 lb 11.2 oz (78.8 kg)  Height: 5' 10"  (1.778 m)    Body mass index is 24.92 kg/m.  Physical Exam  Constitutional: Patient appears well-developed and well-nourished.  No distress.  HEENT: head atraumatic, normocephalic, pupils equal and reactive to light, neck supple Cardiovascular: Normal rate, regular rhythm and normal heart sounds.  No murmur heard. No BLE edema. Pulmonary/Chest: Effort normal and breath sounds normal. No respiratory distress. Abdominal: Soft.  There is no tenderness. Psychiatric: Patient has a normal mood and affect. behavior is normal. Judgment and thought content normal.  Recent Results (from the past 2160 hour(s))  POCT HgB A1C     Status: Abnormal   Collection Time: 10/19/20  9:00 AM  Result Value Ref Range   Hemoglobin A1C 7.2 (A) 4.0 - 5.6 %   HbA1c POC (<> result, manual entry)     HbA1c, POC (prediabetic range)     HbA1c, POC (controlled diabetic range)      Diabetic Foot Exam - Simple   Simple Foot Form Diabetic Foot exam was performed with the following findings: Yes   Visual Inspection See comments: Yes Sensation Testing Intact to touch and monofilament testing bilaterally: Yes Pulse Check Posterior Tibialis and Dorsalis pulse intact bilaterally: Yes Comments Some dry spots, good distal pulses      PHQ2/9: Depression screen [facility] 2/9 10/19/2020 06/17/2020 02/12/2020 11/14/2019 11/12/2019  Decreased Interest 0 0 0 0 0  Down, Depressed, Hopeless 0 0 0 0 0  PHQ - 2 Score 0 0 0 0 0  Altered sleeping - - 0 - 0  Tired, decreased energy - - 0 - 0  Change in appetite - - 0 - 0  Feeling bad or failure about yourself  - - 0 - 0  Trouble concentrating - - 0 - 0  Moving slowly or fidgety/restless - - 0 - 0  Suicidal thoughts - - 0 - 0  PHQ-9 Score - - 0 - 0  Difficult doing work/chores - - Not difficult at all - Not difficult at all  Some recent  data might be  hidden    phq 9 is negative   Fall Risk: Fall Risk  10/19/2020 06/17/2020 02/12/2020 11/14/2019 11/12/2019  Falls in the past year? 0 0 0 0 0  Number falls in past yr: 0 0 - 0 0  Injury with Fall? 0 0 - 0 0  Risk for fall due to : - - - No Fall Risks -  Risk for fall due to: Comment - - - - -  Follow up - - - Falls prevention discussed Falls evaluation completed      Functional Status Survey: Is the patient deaf or have difficulty hearing?: Yes Does the patient have difficulty seeing, even when wearing glasses/contacts?: No Does the patient have difficulty concentrating, remembering, or making decisions?: No Does the patient have difficulty walking or climbing stairs?: No Does the patient have difficulty dressing or bathing?: No Does the patient have difficulty doing errands alone such as visiting a doctor's office or shopping?: No   Assessment & Plan  1. Controlled type 2 diabetes mellitus with microalbuminuria, with long-term current use of insulin (HCC)  - POCT HgB A1C  2. GERD without esophagitis  He will try taking it every other day and monitor   3. Dyslipidemia  On statin therapy   4. Senile purpura (HCC)  Stable   5. Claudication [facility])  - Ambulatory referral to Vascular Surgery  6. History of MI (myocardial infarction)   7. BPH associated with nocturia  Stable   8. Anemia of chronic disease   9. Dyslipidemia associated with type 2 diabetes mellitus ([place])   10. Chronic gout of right foot, unspecified cause   11. Type 2 diabetes mellitus with stage 3 chronic kidney disease and hypertension ([place])  At goal   12. Hypertension associated with stage 3a chronic kidney disease due to type 2 diabetes mellitus ([place])  Doing well, continue medication

## 2020-10-19 ENCOUNTER — Other Ambulatory Visit: Payer: Self-pay

## 2020-10-19 ENCOUNTER — Ambulatory Visit (INDEPENDENT_AMBULATORY_CARE_PROVIDER_SITE_OTHER): Payer: Medicare HMO | Admitting: Family Medicine

## 2020-10-19 ENCOUNTER — Encounter: Payer: Self-pay | Admitting: Family Medicine

## 2020-10-19 VITALS — BP 122/72 | HR 86 | Temp 98.1°F | Resp 16 | Ht 70.0 in | Wt 173.7 lb

## 2020-10-19 DIAGNOSIS — E1169 Type 2 diabetes mellitus with other specified complication: Secondary | ICD-10-CM | POA: Diagnosis not present

## 2020-10-19 DIAGNOSIS — N1831 Chronic kidney disease, stage 3a: Secondary | ICD-10-CM

## 2020-10-19 DIAGNOSIS — E1129 Type 2 diabetes mellitus with other diabetic kidney complication: Secondary | ICD-10-CM | POA: Diagnosis not present

## 2020-10-19 DIAGNOSIS — I252 Old myocardial infarction: Secondary | ICD-10-CM

## 2020-10-19 DIAGNOSIS — K219 Gastro-esophageal reflux disease without esophagitis: Secondary | ICD-10-CM

## 2020-10-19 DIAGNOSIS — Z794 Long term (current) use of insulin: Secondary | ICD-10-CM

## 2020-10-19 DIAGNOSIS — D638 Anemia in other chronic diseases classified elsewhere: Secondary | ICD-10-CM

## 2020-10-19 DIAGNOSIS — R351 Nocturia: Secondary | ICD-10-CM

## 2020-10-19 DIAGNOSIS — N183 Chronic kidney disease, stage 3 unspecified: Secondary | ICD-10-CM

## 2020-10-19 DIAGNOSIS — I129 Hypertensive chronic kidney disease with stage 1 through stage 4 chronic kidney disease, or unspecified chronic kidney disease: Secondary | ICD-10-CM | POA: Insufficient documentation

## 2020-10-19 DIAGNOSIS — E1122 Type 2 diabetes mellitus with diabetic chronic kidney disease: Secondary | ICD-10-CM

## 2020-10-19 DIAGNOSIS — N401 Enlarged prostate with lower urinary tract symptoms: Secondary | ICD-10-CM

## 2020-10-19 DIAGNOSIS — E785 Hyperlipidemia, unspecified: Secondary | ICD-10-CM

## 2020-10-19 DIAGNOSIS — M1A0710 Idiopathic chronic gout, right ankle and foot, without tophus (tophi): Secondary | ICD-10-CM

## 2020-10-19 DIAGNOSIS — R809 Proteinuria, unspecified: Secondary | ICD-10-CM

## 2020-10-19 DIAGNOSIS — I739 Peripheral vascular disease, unspecified: Secondary | ICD-10-CM | POA: Diagnosis not present

## 2020-10-19 DIAGNOSIS — D692 Other nonthrombocytopenic purpura: Secondary | ICD-10-CM

## 2020-10-19 DIAGNOSIS — I259 Chronic ischemic heart disease, unspecified: Secondary | ICD-10-CM | POA: Insufficient documentation

## 2020-10-19 LAB — POCT GLYCOSYLATED HEMOGLOBIN (HGB A1C): Hemoglobin A1C: 7.2 % — AB (ref 4.0–5.6)

## 2020-10-21 DIAGNOSIS — Z85828 Personal history of other malignant neoplasm of skin: Secondary | ICD-10-CM | POA: Diagnosis not present

## 2020-10-21 DIAGNOSIS — D2271 Melanocytic nevi of right lower limb, including hip: Secondary | ICD-10-CM | POA: Diagnosis not present

## 2020-10-21 DIAGNOSIS — D0462 Carcinoma in situ of skin of left upper limb, including shoulder: Secondary | ICD-10-CM | POA: Diagnosis not present

## 2020-10-21 DIAGNOSIS — Z85820 Personal history of malignant melanoma of skin: Secondary | ICD-10-CM | POA: Diagnosis not present

## 2020-10-21 DIAGNOSIS — D485 Neoplasm of uncertain behavior of skin: Secondary | ICD-10-CM | POA: Diagnosis not present

## 2020-10-21 DIAGNOSIS — D225 Melanocytic nevi of trunk: Secondary | ICD-10-CM | POA: Diagnosis not present

## 2020-10-21 DIAGNOSIS — D2262 Melanocytic nevi of left upper limb, including shoulder: Secondary | ICD-10-CM | POA: Diagnosis not present

## 2020-10-21 DIAGNOSIS — L820 Inflamed seborrheic keratosis: Secondary | ICD-10-CM | POA: Diagnosis not present

## 2020-10-21 DIAGNOSIS — X32XXXA Exposure to sunlight, initial encounter: Secondary | ICD-10-CM | POA: Diagnosis not present

## 2020-10-21 DIAGNOSIS — L570 Actinic keratosis: Secondary | ICD-10-CM | POA: Diagnosis not present

## 2020-10-22 ENCOUNTER — Other Ambulatory Visit (INDEPENDENT_AMBULATORY_CARE_PROVIDER_SITE_OTHER): Payer: Self-pay | Admitting: Vascular Surgery

## 2020-10-22 DIAGNOSIS — I739 Peripheral vascular disease, unspecified: Secondary | ICD-10-CM

## 2020-10-23 ENCOUNTER — Ambulatory Visit (INDEPENDENT_AMBULATORY_CARE_PROVIDER_SITE_OTHER): Payer: Medicare HMO

## 2020-10-23 ENCOUNTER — Ambulatory Visit (INDEPENDENT_AMBULATORY_CARE_PROVIDER_SITE_OTHER): Payer: Medicare HMO | Admitting: Vascular Surgery

## 2020-10-23 ENCOUNTER — Other Ambulatory Visit: Payer: Self-pay

## 2020-10-23 VITALS — BP 145/70 | HR 58 | Ht 70.0 in | Wt 175.0 lb

## 2020-10-23 DIAGNOSIS — E1129 Type 2 diabetes mellitus with other diabetic kidney complication: Secondary | ICD-10-CM

## 2020-10-23 DIAGNOSIS — I739 Peripheral vascular disease, unspecified: Secondary | ICD-10-CM

## 2020-10-23 DIAGNOSIS — I10 Essential (primary) hypertension: Secondary | ICD-10-CM | POA: Diagnosis not present

## 2020-10-23 DIAGNOSIS — R809 Proteinuria, unspecified: Secondary | ICD-10-CM | POA: Diagnosis not present

## 2020-10-23 DIAGNOSIS — R200 Anesthesia of skin: Secondary | ICD-10-CM

## 2020-10-23 DIAGNOSIS — M48062 Spinal stenosis, lumbar region with neurogenic claudication: Secondary | ICD-10-CM

## 2020-10-23 DIAGNOSIS — E785 Hyperlipidemia, unspecified: Secondary | ICD-10-CM

## 2020-10-23 DIAGNOSIS — R202 Paresthesia of skin: Secondary | ICD-10-CM | POA: Diagnosis not present

## 2020-10-23 DIAGNOSIS — Z794 Long term (current) use of insulin: Secondary | ICD-10-CM

## 2020-10-23 NOTE — Assessment & Plan Note (Signed)
To evaluate his perfusion as a possible cause of his lower extremity symptoms, noninvasive studies were performed today.  He has normal triphasic waveforms with normal ABIs of 1.07 on the right and 1.13 on the left with brisk normal digital pressures consistent with no arterial insufficiency.  His lower extremity symptoms do not appear to be due to perfusion.  I suspect diabetic neuropathy is the primary cause at this point.  No further vascular work-up or testing is necessary.  I will see him back as needed.

## 2020-10-23 NOTE — Progress Notes (Signed)
Patient ID: Tracy [name], male   DOB: 04/24/1930, 87 y.o.   MRN: [id number]  Chief Complaint  Patient presents with  . New Patient (Initial Visit)    Claudication U/S     HPI Tracy [name] is a 87 y.o. male.  I am asked to see the patient by Dr. Ancil Boozer for evaluation of numbness and coolness of the feet and toes.  This has been a gradual problem for many months now.  The patient is very active.  He walks and rides an exercise bike regularly and is able to do so without disabling pain in his legs.  A few years ago, he was having sciatica type pain and had back surgery which relieved that pain.  That has not really recurred.  No ulceration or infection.  No rest pain.  Both legs are affected.  He has had diabetes for over 25 years.  To evaluate his perfusion as a possible cause of his lower extremity symptoms, noninvasive studies were performed today.  He has normal triphasic waveforms with normal ABIs of 1.07 on the right and 1.13 on the left with brisk normal digital pressures consistent with no arterial insufficiency..     Past Medical History:  Diagnosis Date  . Arthritis   . Cancer ([place])    SKIN CANCER melanoma 2 sites and multiple squamous cell  . Diabetes mellitus without complication ([place])   . GERD (gastroesophageal reflux disease)   . Glaucoma   . Hyperlipidemia   . Hypertension   . Myocardial infarction ([place])    08/27/1979  . Vertigo     Past Surgical History:  Procedure Laterality Date  . CATARACT EXTRACTION W/ INTRAOCULAR LENS  IMPLANT, BILATERAL    . CIRCUMCISION    . 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/10/2019  . GANGLION CYST EXCISION     LEFT HAND  . LUMBAR LAMINECTOMY/DECOMPRESSION MICRODISCECTOMY  09/13/2017   Procedure: BILATERAL Lumbar three-four , Left  Lumbar four-five  AND Left  L5-S1, LAMINECTOMY/FORAMINOTOMY;  Surgeon: [name], MD;  Location: [place];  Service: Neurosurgery;;  . MELANOMA EXCISION  07/2011  . TONSILLECTOMY    . VASECTOMY        Family History  Problem Relation Age of Onset  . Heart disease Mother   . Diabetes Mother        type 2  . Angina Mother   . Heart disease Brother   . Pneumonia Father   . Heart attack Father   . Alcoholism Daughter   . Cirrhosis Daughter      Social History   Tobacco Use  . Smoking status: Former Smoker    Packs/day: 2.00    Years: 13.00    Pack years: 26.00    Types: Cigarettes    Quit date: 1963    Years since quitting: 59.1  . Smokeless tobacco: Never Used  . Tobacco comment: smoking cessation materials not required  Vaping Use  . Vaping Use: Never used  Substance Use Topics  . Alcohol use: Yes    Alcohol/week: 2.0 standard drinks    Types: 2 Shots of liquor per week    Comment: occasional  . Drug use: No    Allergies  Allergen Reactions  . Tetracyclines & Related Other (See Comments)    UNSPECIFIED REACTION  other    Current Outpatient Medications  Medication Sig Dispense Refill  . Accu-Chek FastClix Lancets MISC CHECK  BLOOD  SUGAR TWICE DAILY 204 each 1  . allopurinol (ZYLOPRIM) 100  MG tablet Take 1 tablet (100 mg total) by mouth 2 (two) times daily. 180 tablet 1  . amLODipine (NORVASC) 5 MG tablet TAKE 2 TABLETS EVERY DAY 180 tablet 0  . aspirin 81 MG tablet Take 81 mg by mouth.    Marland Kitchen atorvastatin (LIPITOR) 10 MG tablet TAKE 1 TABLET DAILY AT 6 PM. 90 tablet 0  . Blood Glucose Calibration (ACCU-CHEK AVIVA) SOLN 1 Bottle by In Vitro route as needed. 1 each 2  . Blood Glucose Monitoring Suppl (ACCU-CHEK NANO SMARTVIEW) W/DEVICE KIT USE AS DIRECTED 1 kit 0  . Cholecalciferol (VITAMIN D) 2000 units tablet Take 2,000 Units by mouth daily.    . dorzolamide-timolol (COSOPT) 22.3-6.8 MG/ML ophthalmic solution Place 1 drop into the right eye 2 (two) times daily.    Marland Kitchen doxazosin (CARDURA) 4 MG tablet Take 1 tablet (4 mg total) by mouth at bedtime. 90 tablet 3  . finasteride (PROSCAR) 5 MG tablet Take 1 tablet (5 mg total) by mouth at bedtime. 90 tablet 3  .  glucose blood (COOL BLOOD GLUCOSE TEST STRIPS) test strip Use as instructed 100 each 12  . insulin glargine (LANTUS SOLOSTAR) 100 UNIT/ML Solostar Pen Inject 46 Units into the skin daily. (Patient taking differently: Inject 42 Units into the skin daily.) 30 mL 0  . Insulin Pen Needle 31G X 5 MM MISC 1 Device by Does not apply route as needed. 120 each 2  . JARDIANCE 10 MG TABS tablet Take 10 mg by mouth daily.    Marland Kitchen lisinopril (ZESTRIL) 40 MG tablet Take 1 tablet (40 mg total) by mouth daily. 90 tablet 1  . metFORMIN (GLUCOPHAGE XR) 500 MG 24 hr tablet Take 1 tablet (500 mg total) by mouth daily with breakfast. 90 tablet 1  . Multiple Vitamins-Minerals (CENTRUM SILVER 50+MEN) TABS Take 1 tablet by mouth daily.    Marland Kitchen NEEDLE, DISP, 14 G 14G X 1" MISC     . omega-3 fish oil (MAXEPA) 1000 MG CAPS capsule Take 2 capsules by mouth daily.     Marland Kitchen omeprazole (PRILOSEC) 40 MG capsule Take 1 capsule (40 mg total) by mouth daily. 90 capsule 1  . vitamin B-12 (CYANOCOBALAMIN) 500 MCG tablet Take 1 tablet by mouth daily.     No current facility-administered medications for this visit.      REVIEW OF SYSTEMS (Negative unless checked)  Constitutional: [] Weight loss  [] Fever  [] Chills Cardiac: [] Chest pain   [] Chest pressure   [] Palpitations   [] Shortness of breath when laying flat   [] Shortness of breath at rest   [] Shortness of breath with exertion. Vascular:  [x] Pain in legs with walking   [] Pain in legs at rest   [] Pain in legs when laying flat   [x] Claudication   [] Pain in feet when walking  [] Pain in feet at rest  [] Pain in feet when laying flat   [] History of DVT   [] Phlebitis   [] Swelling in legs   [] Varicose veins   [] Non-healing ulcers Pulmonary:   [] Uses home oxygen   [] Productive cough   [] Hemoptysis   [] Wheeze  [] COPD   [] Asthma Neurologic:  [x] Dizziness  [] Blackouts   [] Seizures   [] History of stroke   [] History of TIA  [] Aphasia   [] Temporary blindness   [] Dysphagia   [] Weakness or numbness in arms    [] Weakness or numbness in legs Musculoskeletal:  [x] Arthritis   [] Joint swelling   [] Joint pain   [] Low back pain Hematologic:  [] Easy bruising  [] Easy bleeding   []   Hypercoagulable state   [] Anemic  [] Hepatitis Gastrointestinal:  [] Blood in stool   [] Vomiting blood  [x] Gastroesophageal reflux/heartburn   [] Abdominal pain Genitourinary:  [x] Chronic kidney disease   [] Difficult urination  [] Frequent urination  [] Burning with urination   [] Hematuria Skin:  [] Rashes   [] Ulcers   [] Wounds Psychological:  [] History of anxiety   []  History of major depression.    Physical Exam BP (!) 145/70   Pulse (!) 58   Ht 5' 10"  (1.778 m)   Wt 175 lb (79.4 kg)   BMI 25.11 kg/m  Gen:  WD/WN, NAD. Appears younger than stated age. Head: [place]/AT, No temporalis wasting. Ear/Nose/Throat: Hearing grossly intact, nares w/o erythema or drainage, oropharynx w/o Erythema/Exudate Eyes: Conjunctiva clear, sclera non-icteric  Neck: trachea midline.  No JVD.  Pulmonary:  Good air movement, respirations not labored, no use of accessory muscles  Cardiac: irregular Vascular:  Vessel Right Left  Radial Palpable Palpable                          DP 2+ 2+  PT 2+ 2+   Gastrointestinal:. No masses, surgical incisions, or scars. Musculoskeletal: M/S 5/5 throughout.  Extremities without ischemic changes.  No deformity or atrophy. No edema. Neurologic: Sensation grossly intact in extremities.  Symmetrical.  Speech is fluent. Motor exam as listed above. Psychiatric: Judgment intact, Mood & affect appropriate for pt's clinical situation. Dermatologic: No rashes or ulcers noted.  No cellulitis or open wounds.    Radiology No results found.  Labs Recent Results (from the past 2160 hour(s))  POCT HgB A1C     Status: Abnormal   Collection Time: 10/19/20  9:00 AM  Result Value Ref Range   Hemoglobin A1C 7.2 (A) 4.0 - 5.6 %   HbA1c POC (<> result, manual entry)     HbA1c, POC (prediabetic range)     HbA1c, POC  (controlled diabetic range)      Assessment/Plan:  Type 2 diabetes mellitus with microalbuminuria, with long-term current use of insulin (HCC) blood glucose control important in reducing the progression of atherosclerotic disease. Also, involved in wound healing. On appropriate medications.   Hypertension blood pressure control important in reducing the progression of atherosclerotic disease. On appropriate oral medications.   Dyslipidemia lipid control important in reducing the progression of atherosclerotic disease. Continue statin therapy   Spinal stenosis of lumbar region with neurogenic claudication S/p back surgery with good results  Numbness and tingling of both feet To evaluate his perfusion as a possible cause of his lower extremity symptoms, noninvasive studies were performed today.  He has normal triphasic waveforms with normal ABIs of 1.07 on the right and 1.13 on the left with brisk normal digital pressures consistent with no arterial insufficiency.  His lower extremity symptoms do not appear to be due to perfusion.  I suspect diabetic neuropathy is the primary cause at this point.  No further vascular work-up or testing is necessary.  I will see him back as needed.      Leotis Pain 10/23/2020, 9:19 AM   This note was created with Dragon medical transcription system.  Any errors from dictation are unintentional.

## 2020-10-23 NOTE — Assessment & Plan Note (Signed)
lipid control important in reducing the progression of atherosclerotic disease. Continue statin therapy  

## 2020-10-23 NOTE — Assessment & Plan Note (Signed)
blood glucose control important in reducing the progression of atherosclerotic disease. Also, involved in wound healing. On appropriate medications.  

## 2020-10-23 NOTE — Assessment & Plan Note (Signed)
blood pressure control important in reducing the progression of atherosclerotic disease. On appropriate oral medications.  

## 2020-10-23 NOTE — Assessment & Plan Note (Signed)
S/p back surgery with good results

## 2020-10-26 DIAGNOSIS — H52223 Regular astigmatism, bilateral: Secondary | ICD-10-CM | POA: Diagnosis not present

## 2020-10-26 DIAGNOSIS — H524 Presbyopia: Secondary | ICD-10-CM | POA: Diagnosis not present

## 2020-11-04 ENCOUNTER — Other Ambulatory Visit: Payer: Self-pay

## 2020-11-04 MED ORDER — INSULIN PEN NEEDLE 31G X 5 MM MISC: 1.0000 | 2 refills | Status: DC | PRN

## 2020-11-06 ENCOUNTER — Other Ambulatory Visit: Payer: Self-pay

## 2020-11-06 MED ORDER — INSULIN PEN NEEDLE 31G X 5 MM MISC: 1.0000 | 2 refills | Status: DC | PRN

## 2020-11-11 DIAGNOSIS — L308 Other specified dermatitis: Secondary | ICD-10-CM | POA: Diagnosis not present

## 2020-11-11 DIAGNOSIS — D0462 Carcinoma in situ of skin of left upper limb, including shoulder: Secondary | ICD-10-CM | POA: Diagnosis not present

## 2020-11-17 ENCOUNTER — Ambulatory Visit (INDEPENDENT_AMBULATORY_CARE_PROVIDER_SITE_OTHER): Payer: Medicare HMO

## 2020-11-17 DIAGNOSIS — Z0000 Encounter for general adult medical examination without abnormal findings: Secondary | ICD-10-CM | POA: Diagnosis not present

## 2020-11-17 NOTE — Patient Instructions (Signed)
Tracy [name] , Thank you for taking time to come for your Medicare Wellness Visit. I appreciate your ongoing commitment to your health goals. Please review the following plan we discussed and let me know if I can assist you in the future.   Screening recommendations/referrals: Colonoscopy: no longer required Recommended yearly ophthalmology/optometry visit for glaucoma screening and checkup Recommended yearly dental visit for hygiene and checkup  Vaccinations: Influenza vaccine: done 06/17/20 Pneumococcal vaccine: done 07/07/14 Tdap vaccine: done 11/28/16 Shingles vaccine: Shingrix discussed. Please contact your pharmacy for coverage information.  Covid-19: done 09/27/19, 10/18/19 & 06/08/20  Advanced directives: Please bring a copy of your health care power of attorney and living will to the office at your convenience.  Conditions/risks identified: Keep up the great work!  Next appointment: Follow up in one year for your annual wellness visit.   Preventive Care 23 Years and Older, Male Preventive care refers to lifestyle choices and visits with your health care provider that can promote health and wellness. What does preventive care include?  A yearly physical exam. This is also called an annual well check.  Dental exams once or twice a year.  Routine eye exams. Ask your health care provider how often you should have your eyes checked.  Personal lifestyle choices, including:  Daily care of your teeth and gums.  Regular physical activity.  Eating a healthy diet.  Avoiding tobacco and drug use.  Limiting alcohol use.  Practicing safe sex.  Taking low doses of aspirin every day.  Taking vitamin and mineral supplements as recommended by your health care provider. What happens during an annual well check? The services and screenings done by your health care provider during your annual well check will depend on your age, overall health, lifestyle risk factors, and family history  of disease. Counseling  Your health care provider may ask you questions about your:  Alcohol use.  Tobacco use.  Drug use.  Emotional well-being.  Home and relationship well-being.  Sexual activity.  Eating habits.  History of falls.  Memory and ability to understand (cognition).  Work and work Statistician. Screening  You may have the following tests or measurements:  Height, weight, and BMI.  Blood pressure.  Lipid and cholesterol levels. These may be checked every 5 years, or more frequently if you are over 9 years old.  Skin check.  Lung cancer screening. You may have this screening every year starting at age 60 if you have a 30-pack-year history of smoking and currently smoke or have quit within the past 15 years.  Fecal occult blood test (FOBT) of the stool. You may have this test every year starting at age 32.  Flexible sigmoidoscopy or colonoscopy. You may have a sigmoidoscopy every 5 years or a colonoscopy every 10 years starting at age 66.  Prostate cancer screening. Recommendations will vary depending on your family history and other risks.  Hepatitis C blood test.  Hepatitis B blood test.  Sexually transmitted disease (STD) testing.  Diabetes screening. This is done by checking your blood sugar (glucose) after you have not eaten for a while (fasting). You may have this done every 1-3 years.  Abdominal aortic aneurysm (AAA) screening. You may need this if you are a current or former smoker.  Osteoporosis. You may be screened starting at age 70 if you are at high risk. Talk with your health care provider about your test results, treatment options, and if necessary, the need for more tests. Vaccines  Your health care  provider may recommend certain vaccines, such as:  Influenza vaccine. This is recommended every year.  Tetanus, diphtheria, and acellular pertussis (Tdap, Td) vaccine. You may need a Td booster every 10 years.  Zoster vaccine. You may  need this after age 60.  Pneumococcal 13-valent conjugate (PCV13) vaccine. One dose is recommended after age 1.  Pneumococcal polysaccharide (PPSV23) vaccine. One dose is recommended after age 37. Talk to your health care provider about which screenings and vaccines you need and how often you need them. This information is not intended to replace advice given to you by your health care provider. Make sure you discuss any questions you have with your health care provider. Document Released: 09/25/2015 Document Revised: 05/18/2016 Document Reviewed: 06/30/2015 Elsevier Interactive Patient Education  2017 [place] Prevention in the Home Falls can cause injuries. They can happen to people of all ages. There are many things you can do to make your home safe and to help prevent falls. What can I do on the outside of my home?  Regularly fix the edges of walkways and driveways and fix any cracks.  Remove anything that might make you trip as you walk through a door, such as a raised step or threshold.  Trim any bushes or trees on the path to your home.  Use bright outdoor lighting.  Clear any walking paths of anything that might make someone trip, such as rocks or tools.  Regularly check to see if handrails are loose or broken. Make sure that both sides of any steps have handrails.  Any raised decks and porches should have guardrails on the edges.  Have any leaves, snow, or ice cleared regularly.  Use sand or salt on walking paths during winter.  Clean up any spills in your garage right away. This includes oil or grease spills. What can I do in the bathroom?  Use night lights.  Install grab bars by the toilet and in the tub and shower. Do not use towel bars as grab bars.  Use non-skid mats or decals in the tub or shower.  If you need to sit down in the shower, use a plastic, non-slip stool.  Keep the floor dry. Clean up any water that spills on the floor as soon as it  happens.  Remove soap buildup in the tub or shower regularly.  Attach bath mats securely with double-sided non-slip rug tape.  Do not have throw rugs and other things on the floor that can make you trip. What can I do in the bedroom?  Use night lights.  Make sure that you have a light by your bed that is easy to reach.  Do not use any sheets or blankets that are too big for your bed. They should not hang down onto the floor.  Have a firm chair that has side arms. You can use this for support while you get dressed.  Do not have throw rugs and other things on the floor that can make you trip. What can I do in the kitchen?  Clean up any spills right away.  Avoid walking on wet floors.  Keep items that you use a lot in easy-to-reach places.  If you need to reach something above you, use a strong step stool that has a grab bar.  Keep electrical cords out of the way.  Do not use floor polish or wax that makes floors slippery. If you must use wax, use non-skid floor wax.  Do not have throw  rugs and other things on the floor that can make you trip. What can I do with my stairs?  Do not leave any items on the stairs.  Make sure that there are handrails on both sides of the stairs and use them. Fix handrails that are broken or loose. Make sure that handrails are as long as the stairways.  Check any carpeting to make sure that it is firmly attached to the stairs. Fix any carpet that is loose or worn.  Avoid having throw rugs at the top or bottom of the stairs. If you do have throw rugs, attach them to the floor with carpet tape.  Make sure that you have a light switch at the top of the stairs and the bottom of the stairs. If you do not have them, ask someone to add them for you. What else can I do to help prevent falls?  Wear shoes that:  Do not have high heels.  Have rubber bottoms.  Are comfortable and fit you well.  Are closed at the toe. Do not wear sandals.  If you  use a stepladder:  Make sure that it is fully opened. Do not climb a closed stepladder.  Make sure that both sides of the stepladder are locked into place.  Ask someone to hold it for you, if possible.  Clearly mark and make sure that you can see:  Any grab bars or handrails.  First and last steps.  Where the edge of each step is.  Use tools that help you move around (mobility aids) if they are needed. These include:  Canes.  Walkers.  Scooters.  Crutches.  Turn on the lights when you go into a dark area. Replace any light bulbs as soon as they burn out.  Set up your furniture so you have a clear path. Avoid moving your furniture around.  If any of your floors are uneven, fix them.  If there are any pets around you, be aware of where they are.  Review your medicines with your doctor. Some medicines can make you feel dizzy. This can increase your chance of falling. Ask your doctor what other things that you can do to help prevent falls. This information is not intended to replace advice given to you by your health care provider. Make sure you discuss any questions you have with your health care provider. Document Released: 06/25/2009 Document Revised: 02/04/2016 Document Reviewed: 10/03/2014 Elsevier Interactive Patient Education  2017 [employer].

## 2020-11-17 NOTE — Progress Notes (Signed)
Subjective:   Tracy [name] is a 87 y.o. male who presents for Medicare Annual/Subsequent preventive examination.  Virtual Visit via Telephone Note  I connected with  Tracy [name] on 11/17/20 at  8:40 AM EST by telephone and verified that I am speaking with the correct person using two identifiers.  Location: Patient: home Provider: [place] Persons participating in the virtual visit: [place]   I discussed the limitations, risks, security and privacy concerns of performing an evaluation and management service by telephone and the availability of in person appointments. The patient expressed understanding and agreed to proceed.  Interactive audio and video telecommunications were attempted between this nurse and patient, however failed, due to patient having technical difficulties OR patient did not have access to video capability.  We continued and completed visit with audio only.  Some vital signs may be absent or patient reported.   Clemetine Marker, LPN    Review of Systems     Cardiac Risk Factors include: advanced age (>25mn, >>39women);diabetes mellitus;dyslipidemia;male gender;hypertension     Objective:    There were no vitals filed for this visit. There is no height or weight on file to calculate BMI.  Advanced Directives 11/17/2020 11/14/2019 11/09/2018 08/13/2018 11/07/2017 09/19/2017 09/13/2017  Does Patient Have a Medical Advance Directive? Yes Yes Yes Yes Yes Yes Yes  Type of AParamedicof AWaimaluLiving will HJen[place]Living will HGrand RapidsLiving will HMidwayLiving will HMurrysvilleLiving will Living will Living will;Healthcare Power of Attorney  Does patient want to make changes to medical advance directive? - - - - - - No - Patient declined  Copy of [place] Chart? No - copy requested No - copy requested No - copy requested -  No - copy requested - No - copy requested  Would patient like information on creating a medical advance directive? - - - - - - -    Current Medications (verified) Outpatient Encounter Medications as of 11/17/2020  Medication Sig  . Accu-Chek FastClix Lancets MISC CHECK  BLOOD  SUGAR TWICE DAILY  . allopurinol (ZYLOPRIM) 100 MG tablet Take 1 tablet (100 mg total) by mouth 2 (two) times daily.  .Marland KitchenamLODipine (NORVASC) 5 MG tablet TAKE 2 TABLETS EVERY DAY  . aspirin 81 MG tablet Take 81 mg by mouth.  .Marland Kitchenatorvastatin (LIPITOR) 10 MG tablet TAKE 1 TABLET DAILY AT 6 PM.  . Blood Glucose Calibration (ACCU-CHEK AVIVA) SOLN 1 Bottle by In Vitro route as needed.  . Blood Glucose Monitoring Suppl (ACCU-CHEK NANO SMARTVIEW) W/DEVICE KIT USE AS DIRECTED  . Cholecalciferol (VITAMIN D) 2000 units tablet Take 2,000 Units by mouth daily.  . dorzolamide-timolol (COSOPT) 22.3-6.8 MG/ML ophthalmic solution Place 1 drop into the right eye 2 (two) times daily.  .Marland Kitchendoxazosin (CARDURA) 4 MG tablet Take 1 tablet (4 mg total) by mouth at bedtime.  . finasteride (PROSCAR) 5 MG tablet Take 1 tablet (5 mg total) by mouth at bedtime.  .Marland Kitchenglucose blood (COOL BLOOD GLUCOSE TEST STRIPS) test strip Use as instructed  . insulin glargine (LANTUS SOLOSTAR) 100 UNIT/ML Solostar Pen Inject 46 Units into the skin daily.  . Insulin Pen Needle 31G X 5 MM MISC 1 Device by Does not apply route as needed.  .Marland KitchenJARDIANCE 10 MG TABS tablet Take 10 mg by mouth daily.  .Marland Kitchenlisinopril (ZESTRIL) 40 MG tablet Take 1 tablet (40 mg total) by mouth daily.  .Marland Kitchen  metFORMIN (GLUCOPHAGE XR) 500 MG 24 hr tablet Take 1 tablet (500 mg total) by mouth daily with breakfast.  . Multiple Vitamins-Minerals (CENTRUM SILVER 50+MEN) TABS Take 1 tablet by mouth daily.  Marland Kitchen omega-3 fish oil (MAXEPA) 1000 MG CAPS capsule Take 2 capsules by mouth daily.   Marland Kitchen omeprazole (PRILOSEC) 40 MG capsule Take 1 capsule (40 mg total) by mouth daily.  . vitamin B-12 (CYANOCOBALAMIN) 500 MCG  tablet Take 1 tablet by mouth daily.  . [DISCONTINUED] NEEDLE, DISP, 14 G 14G X 1" MISC    No facility-administered encounter medications on file as of 11/17/2020.    Allergies (verified) Tetracyclines & related   History: Past Medical History:  Diagnosis Date  . Arthritis   . Cancer ([place])    SKIN CANCER melanoma 2 sites and multiple squamous cell  . Diabetes mellitus without complication ([place])   . GERD (gastroesophageal reflux disease)   . Glaucoma   . Hyperlipidemia   . Hypertension   . Myocardial infarction ([place])    08/27/1979  . Vertigo    Past Surgical History:  Procedure Laterality Date  . CATARACT EXTRACTION W/ INTRAOCULAR LENS  IMPLANT, BILATERAL    . CIRCUMCISION    . 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/10/2019  . GANGLION CYST EXCISION     LEFT HAND  . LUMBAR LAMINECTOMY/DECOMPRESSION MICRODISCECTOMY  09/13/2017   Procedure: BILATERAL Lumbar three-four , Left  Lumbar four-five  AND Left  L5-S1, LAMINECTOMY/FORAMINOTOMY;  Surgeon: [name], MD;  Location: [place];  Service: Neurosurgery;;  . MELANOMA EXCISION  07/2011  . TONSILLECTOMY    . VASECTOMY     Family History  Problem Relation Age of Onset  . Heart disease Mother   . Diabetes Mother        type 2  . Angina Mother   . Heart disease Brother   . Pneumonia Father   . Heart attack Father   . Alcoholism Daughter   . Cirrhosis Daughter    Social History   Socioeconomic History  . Marital status: Married    Spouse name: Tracy [name]  . Number of children: 4  . Years of education: Not on file  . Highest education level: Bachelor's degree (e.g., BA, AB, BS)  Occupational History  . Occupation: Retired  Tobacco Use  . Smoking status: Former Smoker    Packs/day: 2.00    Years: 13.00    Pack years: 26.00    Types: Cigarettes    Quit date: 1963    Years since quitting: 59.2  . Smokeless tobacco: Never Used  . Tobacco comment: smoking cessation materials not required  Vaping Use  . Vaping Use: Never used   Substance and Sexual Activity  . Alcohol use: Yes    Alcohol/week: 2.0 standard drinks    Types: 2 Shots of liquor per week    Comment: occasional  . Drug use: No  . Sexual activity: Not Currently  Other Topics Concern  . Not on file  Social History Narrative   Daughter Manuela Schwartz passed away 2018-10-12.   Social Determinants of Health   Financial Resource Strain: Low Risk   . Difficulty of Paying Living Expenses: Not hard at all  Food Insecurity: No Food Insecurity  . Worried About Charity fundraiser in the Last Year: Never true  . Ran Out of Food in the Last Year: Never true  Transportation Needs: No Transportation Needs  . Lack of Transportation (Medical): No  . Lack of Transportation (Non-Medical): No  Physical Activity:  Sufficiently Active  . Days of Exercise per Week: 7 days  . Minutes of Exercise per Session: 30 min  Stress: No Stress Concern Present  . Feeling of Stress : Not at all  Social Connections: Moderately Integrated  . Frequency of Communication with Friends and Family: More than three times a week  . Frequency of Social Gatherings with Friends and Family: Three times a week  . Attends Religious Services: Never  . Active Member of Clubs or Organizations: Yes  . Attends Archivist Meetings: 1 to 4 times per year  . Marital Status: Married    Tobacco Counseling Counseling given: Not Answered Comment: smoking cessation materials not required   Clinical Intake:  Pre-visit preparation completed: Yes  Pain : No/denies pain     Nutritional Risks: None Diabetes: Yes CBG done?: No Did pt. bring in CBG monitor from home?: No  How often do you need to have someone help you when you read instructions, pamphlets, or other written materials from your doctor or pharmacy?: 1 - Never  Nutrition Risk Assessment:  Has the patient had any N/V/D within the last 2 months?  No  Does the patient have any non-healing wounds?  No  Has the patient had any  unintentional weight loss or weight gain?  No   Diabetes:  Is the patient diabetic?  Yes  If diabetic, was a CBG obtained today?  No  Did the patient bring in their glucometer from home?  No  How often do you monitor your CBG's? daily.   Financial Strains and Diabetes Management:  Are you having any financial strains with the device, your supplies or your medication? No .  Does the patient want to be seen by Chronic Care Management for management of their diabetes?  No  Would the patient like to be referred to a Nutritionist or for Diabetic Management?  No   Diabetic Exams:  Diabetic Eye Exam: Completed per patient; will request records from [facility]. Overdue for diabetic eye exam.   Diabetic Foot Exam: Completed 10/19/20.   Interpreter Needed?: No  Information entered by :: Clemetine Marker LPN   Activities of Daily Living In your present state of health, do you have any difficulty performing the following activities: 11/17/2020 10/19/2020  Hearing? Tempie Donning  Comment wears hearing aids -  Vision? N N  Difficulty concentrating or making decisions? N N  Walking or climbing stairs? N N  Dressing or bathing? N N  Doing errands, shopping? N N  Preparing Food and eating ? N -  Using the Toilet? N -  In the past six months, have you accidently leaked urine? N -  Do you have problems with loss of bowel control? N -  Managing your Medications? N -  Managing your Finances? N -  Housekeeping or managing your Housekeeping? N -  Some recent data might be hidden    Patient Care Team: Steele Sizer, MD as PCP - General (Family Medicine) Dasher, Rayvon Char, MD (Dermatology) Abbie Sons, MD as Consulting Physician (Urology) Dorisann Frames, MD as Referring Physician (Endocrinology) Beverly Gust, MD (Otolaryngology)  Indicate any recent Medical Services you may have received from other than [facility] providers in the past year (date may be approximate).     Assessment:   This  is a routine wellness examination for [facility].  Hearing/Vision screen  Hearing Screening   125Hz  250Hz  500Hz  1000Hz  2000Hz  3000Hz  4000Hz  6000Hz  8000Hz   Right ear:  Left ear:           Comments: Wears hearing aids   Vision Screening Comments: Annual vision screenings done [facility] Thomasene Ripple [facility]  Dietary issues and exercise activities discussed: Current Exercise Habits: Home exercise routine, Type of exercise: walking, Time (Minutes): 30, Frequency (Times/Week): 7, Weekly Exercise (Minutes/Week): 210, Intensity: Moderate, Exercise limited by: None identified  Goals    . DIET - INCREASE WATER INTAKE     Recommend to drink at least 6-8 8oz glasses of water per day.      Depression Screen PHQ 2/9 Scores 11/17/2020 10/19/2020 06/17/2020 02/12/2020 11/14/2019 11/12/2019 07/09/2019  PHQ - 2 Score 0 0 0 0 0 0 0  PHQ- 9 Score - - - 0 - 0 3    Fall Risk Fall Risk  11/17/2020 10/19/2020 06/17/2020 02/12/2020 11/14/2019  Falls in the past year? 0 0 0 0 0  Number falls in past yr: 0 0 0 - 0  Injury with Fall? 0 0 0 - 0  Risk for fall due to : No Fall Risks - - - No Fall Risks  Risk for fall due to: Comment - - - - -  Follow up Falls prevention discussed - - - Falls prevention discussed    FALL RISK PREVENTION PERTAINING TO THE HOME:  Any stairs in or around the home? Yes  If so, are there any without handrails? No  Home free of loose throw rugs in walkways, pet beds, electrical cords, etc? Yes  Adequate lighting in your home to reduce risk of falls? Yes   ASSISTIVE DEVICES UTILIZED TO PREVENT FALLS:  Life alert? No  Use of a cane, walker or w/c? No  Grab bars in the bathroom? Yes  Shower chair or bench in shower? No  Elevated toilet seat or a handicapped toilet? No   TIMED UP AND GO:  Was the test performed? No . Telephonic visit.   Cognitive Function: Normal cognitive status assessed by direct observation by this Nurse Health Advisor. No abnormalities found.        6CIT Screen 11/14/2019 11/09/2018 11/07/2017 11/28/2016  What Year? 0 points 0 points 0 points 0 points  What month? 0 points 0 points 0 points 0 points  What time? 0 points 0 points 0 points 0 points  Count back from 20 0 points 0 points 0 points 0 points  Months in reverse 0 points 0 points 0 points 0 points  Repeat phrase 0 points 0 points 2 points 0 points  Total Score 0 0 2 0    Immunizations Immunization History  Administered Date(s) Administered  . Fluad Quad(high Dose 65+) 07/09/2019, 06/17/2020  . Influenza, High Dose Seasonal PF 07/07/2014, 07/28/2016, 06/27/2017, 07/26/2018  . Influenza, Seasonal, Injecte, Preservative Fre 07/16/2012  . Influenza,inj,Quad PF,6+ Mos 06/18/2013, 06/30/2015  . Influenza-Unspecified 07/13/2002, 07/21/2003, 07/12/2004, 06/12/2005, 06/26/2006, 07/18/2007, 07/13/2008, 07/22/2009, 07/16/2015  . PFIZER(Purple Top)SARS-COV-2 Vaccination 09/27/2019, 10/18/2019, 06/08/2020  . Pneumococcal Conjugate-13 07/07/2014  . Pneumococcal Polysaccharide-23 07/13/2001, 07/26/2010  . Tdap 11/28/2016    TDAP status: Up to date  Flu Vaccine status: Up to date  Pneumococcal vaccine status: Up to date  Covid-19 vaccine status: Completed vaccines  Qualifies for Shingles Vaccine? Yes   Zostavax completed Yes   Shingrix Completed?: No.    Education has been provided regarding the importance of this vaccine. Patient has been advised to call insurance company to determine out of pocket expense if they have not yet received this vaccine. Advised may  also receive vaccine at local pharmacy or Health Dept. Verbalized acceptance and understanding.  Screening Tests Health Maintenance  Topic Date Due  . OPHTHALMOLOGY EXAM  09/21/2019  . HEMOGLOBIN A1C  04/18/2021  . FOOT EXAM  10/19/2021  . TETANUS/TDAP  11/29/2026  . INFLUENZA VACCINE  Completed  . COVID-19 Vaccine  Completed  . PNA vac Low Risk Adult  Completed  . HPV VACCINES  Aged Out    Health  Maintenance  Health Maintenance Due  Topic Date Due  . OPHTHALMOLOGY EXAM  09/21/2019    Colorectal cancer screening: No longer required.   Lung Cancer Screening: (Low Dose CT Chest recommended if Age 75-80 years, 30 pack-year currently smoking OR have quit w/in 15years.) does not qualify.   Additional Screening:  Hepatitis C Screening: does not qualify.   Vision Screening: Recommended annual ophthalmology exams for early detection of glaucoma and other disorders of the eye. Is the patient up to date with their annual eye exam?  Yes  Who is the provider or what is the name of the office in which the patient attends annual eye exams? [place] Screening: Recommended annual dental exams for proper oral hygiene  Community Resource Referral / Chronic Care Management: CRR required this visit?  No   CCM required this visit?  No      Plan:     I have personally reviewed and noted the following in the patient's chart:   . Medical and social history . Use of alcohol, tobacco or illicit drugs  . Current medications and supplements . Functional ability and status . Nutritional status . Physical activity . Advanced directives . List of other physicians . Hospitalizations, surgeries, and ER visits in previous 12 months . Vitals . Screenings to include cognitive, depression, and falls . Referrals and appointments  In addition, I have reviewed and discussed with patient certain preventive protocols, quality metrics, and best practice recommendations. A written personalized care plan for preventive services as well as general preventive health recommendations were provided to patient.     Clemetine Marker, LPN   09/19/8414   Nurse Notes: none

## 2020-11-30 DIAGNOSIS — H6123 Impacted cerumen, bilateral: Secondary | ICD-10-CM | POA: Diagnosis not present

## 2020-11-30 DIAGNOSIS — H903 Sensorineural hearing loss, bilateral: Secondary | ICD-10-CM | POA: Diagnosis not present

## 2020-12-25 ENCOUNTER — Other Ambulatory Visit: Payer: Self-pay | Admitting: Family Medicine

## 2020-12-25 DIAGNOSIS — E785 Hyperlipidemia, unspecified: Secondary | ICD-10-CM

## 2020-12-25 DIAGNOSIS — M109 Gout, unspecified: Secondary | ICD-10-CM

## 2021-01-24 DIAGNOSIS — Z20822 Contact with and (suspected) exposure to covid-19: Secondary | ICD-10-CM | POA: Diagnosis not present

## 2021-01-24 DIAGNOSIS — R059 Cough, unspecified: Secondary | ICD-10-CM | POA: Diagnosis not present

## 2021-01-24 DIAGNOSIS — U071 COVID-19: Secondary | ICD-10-CM | POA: Diagnosis not present

## 2021-01-25 ENCOUNTER — Telehealth: Payer: Self-pay | Admitting: Family Medicine

## 2021-01-25 NOTE — Telephone Encounter (Signed)
Called pt to get him scheduled for a virtual appt and pt declined appt for medications

## 2021-01-25 NOTE — Telephone Encounter (Signed)
Pt is calling because he tested positive for covid yesterday.  He had started with sx on Friday 01/22/21. Pt went to UC and he received positive dx. Pt was given no meds. Pt is requesting medicine for headache, soreness, no fever, and cough. Requesting cough syrup. Preferred pharmacy- [facility] [phone] No appts available.

## 2021-01-26 ENCOUNTER — Telehealth: Payer: Self-pay

## 2021-01-26 ENCOUNTER — Telehealth (INDEPENDENT_AMBULATORY_CARE_PROVIDER_SITE_OTHER): Payer: Medicare HMO | Admitting: Family Medicine

## 2021-01-26 ENCOUNTER — Encounter: Payer: Self-pay | Admitting: Family Medicine

## 2021-01-26 VITALS — Ht 70.0 in | Wt 175.0 lb

## 2021-01-26 DIAGNOSIS — U071 COVID-19: Secondary | ICD-10-CM

## 2021-01-26 NOTE — Telephone Encounter (Signed)
Spoke with patient and clarified the need for virtual/office visit. Patient was pleasant and verbalized understanding. He agreed to schedule and was added on at 11:20 today.

## 2021-01-26 NOTE — Telephone Encounter (Signed)
Reached out to pt and he was frustrated at [place] for requesting an appt. Pt declined

## 2021-01-26 NOTE — Progress Notes (Signed)
Name: Tracy [name]   MRN: [id number]    DOB: 06/21/1930   Date:01/26/2021       Progress Note  Subjective  Chief Complaint  Chief Complaint  Patient presents with  . Covid Positive    I connected with  [name]  on 01/26/21 at 11:20 AM EDT by telephone  application and verified that I am speaking with the correct person using two identifiers.  I discussed the limitations of evaluation and management by telemedicine and the availability of in person appointments. The patient expressed understanding and agreed to proceed with the virtual visit  Staff also discussed with the patient that there may be a patient responsible charge related to this service. Patient Location: at home  Provider Location: [facility] Additional Individuals present: wife   HPI  COVID-19: he states symptoms started on Friday 01/22/2021, with a dry cough, followed by body aches and fatigue . He finally went to Urgent Care on 01/24/2021 [facility] and tested positive for COVID-19 negative for flu. He was sent home on benzonate and sent him home. He has been staying hydrated. He has been taking Ibuprofen otc . He noticed some sneezing, headache and nasal congestion yesterday, he also noticed that his cough was productive but no wheezing or SOB . He is feeling much better today. Only took one otc ibuprofen today and headache is not as severe, body ache has also improved He has noticed poor appetite but has been eating normally. No change in taste or smell   He had three doses of Pfizer   Patient Active Problem List   Diagnosis Date Noted  . Numbness and tingling of both feet 10/23/2020  . Chronic ischemic heart disease 10/19/2020  . Hypertension associated with stage 3a chronic kidney disease due to type 2 diabetes mellitus ([place]) 10/19/2020  . Anemia of chronic disease 11/12/2019  . Hearing loss on right 11/26/2018  . Labyrinthitis of right ear 10/25/2018  . Spinal stenosis of lumbar region 09/13/2017  .  Spinal stenosis of lumbar region with neurogenic claudication 05/03/2017  . Type 2 diabetes mellitus without complication, with long-term current use of insulin ([place]) 05/03/2017  . Left hip pain 07/28/2016  . ALT (SGPT) level raised 07/13/2015  . At risk for falling 07/13/2015  . Arthritis of hand, degenerative 07/13/2015  . Gout 07/13/2015  . Glaucoma 07/13/2015  . Diabetic neuropathy ([place]) 07/13/2015  . Hypertension 04/09/2015  . Type 2 diabetes mellitus with microalbuminuria, with long-term current use of insulin ([place]) 04/09/2015  . Dyslipidemia 04/09/2015  . Hypertriglyceridemia 04/09/2015  . Benign fibroma of prostate 04/09/2015  . Acid reflux 04/09/2015  . History of melanoma excision 04/09/2015  . History of squamous cell carcinoma 05/23/2013  . H/O adenomatous polyp of colon 08/25/2010  . History of MI (myocardial infarction) 08/27/1979    Past Surgical History:  Procedure Laterality Date  . CATARACT EXTRACTION W/ INTRAOCULAR LENS  IMPLANT, BILATERAL    . CIRCUMCISION    . 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/10/2019  . GANGLION CYST EXCISION     LEFT HAND  . LUMBAR LAMINECTOMY/DECOMPRESSION MICRODISCECTOMY  09/13/2017   Procedure: BILATERAL Lumbar three-four , Left  Lumbar four-five  AND Left  L5-S1, LAMINECTOMY/FORAMINOTOMY;  Surgeon: [name], MD;  Location: [facility];  Service: Neurosurgery;;  . MELANOMA EXCISION  07/2011  . TONSILLECTOMY    . VASECTOMY      Family History  Problem Relation Age of Onset  . Heart disease Mother   .  Diabetes Mother        type 2  . Angina Mother   . Heart disease Brother   . Pneumonia Father   . Heart attack Father   . Alcoholism Daughter   . Cirrhosis Daughter     Social History   Socioeconomic History  . Marital status: Married    Spouse name: [name]  . Number of children: 4  . Years of education: Not on file  . Highest education level: Bachelor's degree (e.g., BA, AB, BS)  Occupational History  . Occupation: Retired  Tobacco  Use  . Smoking status: Former Smoker    Packs/day: 2.00    Years: 13.00    Pack years: 26.00    Types: Cigarettes    Quit date: 1963    Years since quitting: 59.4  . Smokeless tobacco: Never Used  . Tobacco comment: smoking cessation materials not required  Vaping Use  . Vaping Use: Never used  Substance and Sexual Activity  . Alcohol use: Yes    Alcohol/week: 2.0 standard drinks    Types: 2 Shots of liquor per week    Comment: occasional  . Drug use: No  . Sexual activity: Not Currently  Other Topics Concern  . Not on file  Social History Narrative   Daughter Manuela Schwartz passed away October 26, 2018.   Social Determinants of Health   Financial Resource Strain: Low Risk   . Difficulty of Paying Living Expenses: Not hard at all  Food Insecurity: No Food Insecurity  . Worried About Charity fundraiser in the Last Year: Never true  . Ran Out of Food in the Last Year: Never true  Transportation Needs: No Transportation Needs  . Lack of Transportation (Medical): No  . Lack of Transportation (Non-Medical): No  Physical Activity: Sufficiently Active  . Days of Exercise per Week: 7 days  . Minutes of Exercise per Session: 30 min  Stress: No Stress Concern Present  . Feeling of Stress : Not at all  Social Connections: Moderately Integrated  . Frequency of Communication with Friends and Family: More than three times a week  . Frequency of Social Gatherings with Friends and Family: Three times a week  . Attends Religious Services: Never  . Active Member of Clubs or Organizations: Yes  . Attends Archivist Meetings: 1 to 4 times per year  . Marital Status: Married  Human resources officer Violence: Not At Risk  . Fear of Current or Ex-Partner: No  . Emotionally Abused: No  . Physically Abused: No  . Sexually Abused: No     Current Outpatient Medications:  .  Accu-Chek FastClix Lancets MISC, CHECK  BLOOD  SUGAR TWICE DAILY, Disp: 204 each, Rfl: 1 .  allopurinol (ZYLOPRIM) 100 MG  tablet, TAKE 1 TABLET TWICE DAILY, Disp: 180 tablet, Rfl: 0 .  amLODipine (NORVASC) 5 MG tablet, TAKE 2 TABLETS EVERY DAY, Disp: 180 tablet, Rfl: 0 .  aspirin 81 MG tablet, Take 81 mg by mouth., Disp: , Rfl:  .  atorvastatin (LIPITOR) 10 MG tablet, TAKE 1 TABLET DAILY AT 6 PM., Disp: 90 tablet, Rfl: 0 .  Blood Glucose Calibration (ACCU-CHEK AVIVA) SOLN, 1 Bottle by In Vitro route as needed., Disp: 1 each, Rfl: 2 .  Blood Glucose Monitoring Suppl (ACCU-CHEK NANO SMARTVIEW) W/DEVICE KIT, USE AS DIRECTED, Disp: 1 kit, Rfl: 0 .  Cholecalciferol (VITAMIN D) 2000 units tablet, Take 2,000 Units by mouth daily., Disp: , Rfl:  .  dorzolamide-timolol (COSOPT) 22.3-6.8 MG/ML ophthalmic solution, Place  1 drop into the right eye 2 (two) times daily., Disp: , Rfl:  .  doxazosin (CARDURA) 4 MG tablet, Take 1 tablet (4 mg total) by mouth at bedtime., Disp: 90 tablet, Rfl: 3 .  finasteride (PROSCAR) 5 MG tablet, Take 1 tablet (5 mg total) by mouth at bedtime., Disp: 90 tablet, Rfl: 3 .  glucose blood (COOL BLOOD GLUCOSE TEST STRIPS) test strip, Use as instructed, Disp: 100 each, Rfl: 12 .  insulin glargine (LANTUS SOLOSTAR) 100 UNIT/ML Solostar Pen, Inject 46 Units into the skin daily., Disp: 30 mL, Rfl: 0 .  Insulin Pen Needle 31G X 5 MM MISC, 1 Device by Does not apply route as needed., Disp: 120 each, Rfl: 2 .  JARDIANCE 10 MG TABS tablet, Take 10 mg by mouth daily., Disp: , Rfl:  .  lisinopril (ZESTRIL) 40 MG tablet, Take 1 tablet (40 mg total) by mouth daily., Disp: 90 tablet, Rfl: 1 .  metFORMIN (GLUCOPHAGE XR) 500 MG 24 hr tablet, Take 1 tablet (500 mg total) by mouth daily with breakfast., Disp: 90 tablet, Rfl: 1 .  Multiple Vitamins-Minerals (CENTRUM SILVER 50+MEN) TABS, Take 1 tablet by mouth daily., Disp: , Rfl:  .  omega-3 fish oil (MAXEPA) 1000 MG CAPS capsule, Take 2 capsules by mouth daily. , Disp: , Rfl:  .  omeprazole (PRILOSEC) 40 MG capsule, Take 1 capsule (40 mg total) by mouth daily., Disp: 90  capsule, Rfl: 1 .  vitamin B-12 (CYANOCOBALAMIN) 500 MCG tablet, Take 1 tablet by mouth daily., Disp: , Rfl:   Allergies  Allergen Reactions  . Tetracyclines & Related Other (See Comments)    UNSPECIFIED REACTION  other    I personally reviewed active problem list, medication list, allergies, family history, social history with the patient/caregiver today.   ROS  Ten systems reviewed and is negative except as mentioned in HPI   Objective  Virtual encounter, vitals not obtained.  Body mass index is 25.11 kg/m.  Physical Exam  Awake, alert and oriented   PHQ2/9: Depression screen [facility] 2/9 01/26/2021 11/17/2020 10/19/2020 06/17/2020 02/12/2020  Decreased Interest 0 0 0 0 0  Down, Depressed, Hopeless 0 0 0 0 0  PHQ - 2 Score 0 0 0 0 0  Altered sleeping - - - - 0  Tired, decreased energy - - - - 0  Change in appetite - - - - 0  Feeling bad or failure about yourself  - - - - 0  Trouble concentrating - - - - 0  Moving slowly or fidgety/restless - - - - 0  Suicidal thoughts - - - - 0  PHQ-9 Score - - - - 0  Difficult doing work/chores - - - - Not difficult at all  Some recent data might be hidden   PHQ-2/9 Result is negative.    Fall Risk: Fall Risk  01/26/2021 11/17/2020 10/19/2020 06/17/2020 02/12/2020  Falls in the past year? 0 0 0 0 0  Number falls in past yr: 0 0 0 0 -  Injury with Fall? 0 0 0 0 -  Risk for fall due to : - No Fall Risks - - -  Risk for fall due to: Comment - - - - -  Follow up Falls prevention discussed Falls prevention discussed - - -     Assessment & Plan  1. COVID-19  Explained that since he is improving, we will not give him Paxlovid Advised mucinex, fluids, rest, may try loratadine or zyrtec, also use nasal saline  Purchase a pulse oximeter and check it multiple times a day, go to [facility] or call EMS if pulse ox below 90 %   Patient voiced understanding    I discussed the assessment and treatment plan with the patient. The patient was provided an  opportunity to ask questions and all were answered. The patient agreed with the plan and demonstrated an understanding of the instructions.  The patient was advised to call back or seek an in-person evaluation if the symptoms worsen or if the condition fails to improve as anticipated.  I provided 15 minutes of non-face-to-face time during this encounter.

## 2021-02-05 DIAGNOSIS — U071 COVID-19: Secondary | ICD-10-CM | POA: Diagnosis not present

## 2021-02-05 DIAGNOSIS — J0100 Acute maxillary sinusitis, unspecified: Secondary | ICD-10-CM | POA: Diagnosis not present

## 2021-03-01 ENCOUNTER — Other Ambulatory Visit: Payer: Self-pay

## 2021-03-01 ENCOUNTER — Encounter: Payer: Self-pay | Admitting: Family Medicine

## 2021-03-01 ENCOUNTER — Ambulatory Visit: Payer: Medicare HMO | Admitting: Family Medicine

## 2021-03-01 VITALS — BP 130/68 | HR 80 | Temp 98.1°F | Resp 16 | Ht 70.0 in | Wt 173.0 lb

## 2021-03-01 DIAGNOSIS — Z794 Long term (current) use of insulin: Secondary | ICD-10-CM

## 2021-03-01 DIAGNOSIS — E1129 Type 2 diabetes mellitus with other diabetic kidney complication: Secondary | ICD-10-CM

## 2021-03-01 DIAGNOSIS — N1831 Chronic kidney disease, stage 3a: Secondary | ICD-10-CM

## 2021-03-01 DIAGNOSIS — E1122 Type 2 diabetes mellitus with diabetic chronic kidney disease: Secondary | ICD-10-CM | POA: Diagnosis not present

## 2021-03-01 DIAGNOSIS — I252 Old myocardial infarction: Secondary | ICD-10-CM

## 2021-03-01 DIAGNOSIS — E1169 Type 2 diabetes mellitus with other specified complication: Secondary | ICD-10-CM | POA: Diagnosis not present

## 2021-03-01 DIAGNOSIS — I739 Peripheral vascular disease, unspecified: Secondary | ICD-10-CM

## 2021-03-01 DIAGNOSIS — R809 Proteinuria, unspecified: Secondary | ICD-10-CM | POA: Diagnosis not present

## 2021-03-01 DIAGNOSIS — M109 Gout, unspecified: Secondary | ICD-10-CM | POA: Diagnosis not present

## 2021-03-01 DIAGNOSIS — R351 Nocturia: Secondary | ICD-10-CM

## 2021-03-01 DIAGNOSIS — K219 Gastro-esophageal reflux disease without esophagitis: Secondary | ICD-10-CM

## 2021-03-01 DIAGNOSIS — I129 Hypertensive chronic kidney disease with stage 1 through stage 4 chronic kidney disease, or unspecified chronic kidney disease: Secondary | ICD-10-CM

## 2021-03-01 DIAGNOSIS — E785 Hyperlipidemia, unspecified: Secondary | ICD-10-CM

## 2021-03-01 DIAGNOSIS — D692 Other nonthrombocytopenic purpura: Secondary | ICD-10-CM

## 2021-03-01 DIAGNOSIS — N401 Enlarged prostate with lower urinary tract symptoms: Secondary | ICD-10-CM

## 2021-03-01 LAB — POCT GLYCOSYLATED HEMOGLOBIN (HGB A1C): Hemoglobin A1C: 6.6 % — AB (ref 4.0–5.6)

## 2021-03-01 MED ORDER — OMEPRAZOLE 40 MG PO CPDR: 40.0000 mg | DELAYED_RELEASE_CAPSULE | Freq: Every day | ORAL | 1 refills | Status: DC

## 2021-03-01 MED ORDER — ATORVASTATIN CALCIUM 10 MG PO TABS: 10.0000 mg | ORAL_TABLET | Freq: Every day | ORAL | 1 refills | Status: DC

## 2021-03-01 MED ORDER — ALLOPURINOL 100 MG PO TABS: 100.0000 mg | ORAL_TABLET | Freq: Two times a day (BID) | ORAL | 1 refills | Status: DC

## 2021-03-01 MED ORDER — METFORMIN HCL ER 500 MG PO TB24: 500.0000 mg | ORAL_TABLET | Freq: Every day | ORAL | 1 refills | Status: DC

## 2021-03-01 MED ORDER — JARDIANCE 10 MG PO TABS: 10.0000 mg | ORAL_TABLET | Freq: Every day | ORAL | 1 refills | Status: DC

## 2021-03-01 MED ORDER — AMLODIPINE BESY-BENAZEPRIL HCL 10-40 MG PO CAPS: 1.0000 | ORAL_CAPSULE | Freq: Every day | ORAL | 1 refills | Status: DC

## 2021-03-01 NOTE — Progress Notes (Signed)
Name: Tracy [name]   MRN: [id number]    DOB: [birth year]   Date:03/01/2021       Progress Note  Subjective  Chief Complaint  Follow up   HPI  Claudication: he states he has noticed aching sensation on his thighs/feels tired when playing golf and unable to play 18 holes. He is walking on the treadmill for 30 minutes and also riding stationary bike for about 20 minutes daily , he has recently noticed that his toes have been cold in the morning and feels tingling. He had normal vascular studies, and likely symptoms from diabetic neuropathy , since symptoms are mild we will not treat at this time   GERD: we switched from Pepcid to PPI back in March 2021 , he is doing well now, no heartburn or indigestion. 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 He has been doing well since, denies chest pain, SOB or orthopnea. Denies decrease in exercise tolerance,  playing golf two to three times a week instead of going to the gym    DMII: he sees Dr. Kenton Kingfisher at [facility] usually once a year, , he is on Metformin 500 mg ER, Jardiance and also Lantus at 42-46 advised to stay on 44 daily.he denies . Discussed yearly eye exam. FSBS has been 110-130 He has CKI and dyslipidemia, taking ACE and also statin therapy    Hyperlipidemia: taking medication and denies side effects, LDL is at goal. No myalgia. He has leg cramps at night but very seldom , doing better since he started to drink more water .    HTN: under control with medication, no chest pain or palpitation, no dizziness.He needs refills of medication    Hearing loss sudden, happened after an URI back in Dec 2020  seen by Dr. Tami Ribas and had steroid and is using a hearing aid - but did not wear it today because it is bothersome in a smaller room .    Osteoarthritis: he has OA of left thumb, intermittent medial knee pain when moving/walking but  responding to topical medication  Stable, pain only present when it rains Recently noticing pain on left medial knee, no effusion or redness, he has been taking ibuprofen daily advised to switch to Tylenol . He has gone to [facility] but would like to go somewhere else . He will try Tylenol and will contact me back to get referral to [facility]    Senile purpura: on arms, unchanged . Reassurance given    History of skin cancer: he is compliant with dermatologist visits    Anemia of chronic disease/CKI stage III: reviewed last labs, urine micro 56, he is on lisinopril, he denies pruritus or decrease in urinary output . We will recheck urine micro today    BPH: sees Urologist, no nocturia, symptoms controlled. He is happy with medications - takes Cardura daily   Patient Active Problem List   Diagnosis Date Noted   Numbness and tingling of both feet 10/23/2020   Chronic ischemic heart disease 10/19/2020   Hypertension associated with stage 3a chronic kidney disease due to type 2 diabetes mellitus ([place]) 10/19/2020   Anemia of chronic disease 11/12/2019   Hearing loss on right 11/26/2018   Labyrinthitis of right ear 10/25/2018   Spinal stenosis of lumbar region 09/13/2017   Spinal stenosis of lumbar region with neurogenic  claudication 05/03/2017   Type 2 diabetes mellitus without complication, with long-term current use of insulin ([place]) 05/03/2017   Left hip pain 07/28/2016   ALT (SGPT) level raised 07/13/2015   At risk for falling 07/13/2015   Arthritis of hand, degenerative 07/13/2015   Gout 07/13/2015   Glaucoma 07/13/2015   Diabetic neuropathy ([place]) 07/13/2015   Hypertension 04/09/2015   Type 2 diabetes mellitus with microalbuminuria, with long-term current use of insulin ([place]) 04/09/2015   Dyslipidemia 04/09/2015   Hypertriglyceridemia 04/09/2015   Benign fibroma of prostate 04/09/2015   Acid reflux 04/09/2015   History of melanoma excision 04/09/2015   History of squamous  cell carcinoma 05/23/2013   H/O adenomatous polyp of colon 08/25/2010   History of MI (myocardial infarction) 08/27/1979    Past Surgical History:  Procedure Laterality Date   CATARACT EXTRACTION W/ INTRAOCULAR LENS  IMPLANT, BILATERAL     CIRCUMCISION     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/10/2019   GANGLION CYST EXCISION     LEFT HAND   LUMBAR LAMINECTOMY/DECOMPRESSION MICRODISCECTOMY  09/13/2017   Procedure: BILATERAL Lumbar three-four , Left  Lumbar four-five  AND Left  L5-S1, LAMINECTOMY/FORAMINOTOMY;  Surgeon: [name], MD;  Location: [facility];  Service: Neurosurgery;;   MELANOMA EXCISION  07/2011   TONSILLECTOMY     VASECTOMY      Family History  Problem Relation Age of Onset   Heart disease Mother    Diabetes Mother        type 2   Angina Mother    Heart disease Brother    Pneumonia Father    Heart attack Father    Alcoholism Daughter    Cirrhosis Daughter     Social History   Tobacco Use   Smoking status: Former    Packs/day: 2.00    Years: 13.00    Pack years: 26.00    Types: Cigarettes    Quit date: 1963    Years since quitting: 59.5   Smokeless tobacco: Never   Tobacco comments:    smoking cessation materials not required  Substance Use Topics   Alcohol use: Yes    Alcohol/week: 2.0 standard drinks    Types: 2 Shots of liquor per week    Comment: occasional     Current Outpatient Medications:    Accu-Chek FastClix Lancets MISC, CHECK  BLOOD  SUGAR TWICE DAILY, Disp: 204 each, Rfl: 1   allopurinol (ZYLOPRIM) 100 MG tablet, TAKE 1 TABLET TWICE DAILY, Disp: 180 tablet, Rfl: 0   amLODipine (NORVASC) 5 MG tablet, TAKE 2 TABLETS EVERY DAY, Disp: 180 tablet, Rfl: 0   aspirin 81 MG tablet, Take 81 mg by mouth., Disp: , Rfl:    atorvastatin (LIPITOR) 10 MG tablet, TAKE 1 TABLET DAILY AT 6 PM., Disp: 90 tablet, Rfl: 0   Blood Glucose Calibration (ACCU-CHEK AVIVA) SOLN, 1 Bottle by In Vitro route as needed., Disp: 1 each, Rfl: 2   Blood Glucose Monitoring Suppl  (ACCU-CHEK NANO SMARTVIEW) W/DEVICE KIT, USE AS DIRECTED, Disp: 1 kit, Rfl: 0   Cholecalciferol (VITAMIN D) 2000 units tablet, Take 2,000 Units by mouth daily., Disp: , Rfl:    dorzolamide-timolol (COSOPT) 22.3-6.8 MG/ML ophthalmic solution, Place 1 drop into the right eye 2 (two) times daily., Disp: , Rfl:    doxazosin (CARDURA) 4 MG tablet, Take 1 tablet (4 mg total) by mouth at bedtime., Disp: 90 tablet, Rfl: 3   finasteride (PROSCAR) 5 MG tablet, Take 1 tablet (5 mg total) by mouth at  bedtime., Disp: 90 tablet, Rfl: 3   glucose blood (COOL BLOOD GLUCOSE TEST STRIPS) test strip, Use as instructed, Disp: 100 each, Rfl: 12   insulin glargine (LANTUS SOLOSTAR) 100 UNIT/ML Solostar Pen, Inject 46 Units into the skin daily., Disp: 30 mL, Rfl: 0   Insulin Pen Needle 31G X 5 MM MISC, 1 Device by Does not apply route as needed., Disp: 120 each, Rfl: 2   JARDIANCE 10 MG TABS tablet, Take 10 mg by mouth daily., Disp: , Rfl:    lisinopril (ZESTRIL) 40 MG tablet, Take 1 tablet (40 mg total) by mouth daily., Disp: 90 tablet, Rfl: 1   metFORMIN (GLUCOPHAGE XR) 500 MG 24 hr tablet, Take 1 tablet (500 mg total) by mouth daily with breakfast., Disp: 90 tablet, Rfl: 1   Multiple Vitamins-Minerals (CENTRUM SILVER 50+MEN) TABS, Take 1 tablet by mouth daily., Disp: , Rfl:    omega-3 fish oil (MAXEPA) 1000 MG CAPS capsule, Take 2 capsules by mouth daily. , Disp: , Rfl:    omeprazole (PRILOSEC) 40 MG capsule, Take 1 capsule (40 mg total) by mouth daily., Disp: 90 capsule, Rfl: 1   vitamin B-12 (CYANOCOBALAMIN) 500 MCG tablet, Take 1 tablet by mouth daily., Disp: , Rfl:   Allergies  Allergen Reactions   Tetracyclines & Related Other (See Comments)    UNSPECIFIED REACTION  other    I personally reviewed active problem list, medication list, allergies, family history, social history, health maintenance with the patient/caregiver today.   ROS  Constitutional: Negative for fever or weight change.  Respiratory:  Negative for cough and shortness of breath.   Cardiovascular: Negative for chest pain or palpitations.  Gastrointestinal: Negative for abdominal pain, no bowel changes.  Musculoskeletal: Negative for gait problem or joint swelling.  Skin: Negative for rash.  Neurological: Negative for dizziness or headache.  No other specific complaints in a complete review of systems (except as listed in HPI above).   Objective  Vitals:   03/01/21 1556  BP: 130/68  Pulse: 80  Resp: 16  Temp: 98.1 F (36.7 C)  TempSrc: Oral  SpO2: 97%  Weight: 173 lb (78.5 kg)  Height: 5' 10" (1.778 m)    Body mass index is 24.82 kg/m.  Physical Exam  Constitutional: Patient appears well-developed and well-nourished.  No distress.  HEENT: head atraumatic, normocephalic, pupils equal and reactive to light, neck supple Cardiovascular: Normal rate, regular rhythm and normal heart sounds.  No murmur heard. No BLE edema. Pulmonary/Chest: Effort normal and breath sounds normal. No respiratory distress. Abdominal: Soft.  There is no tenderness. Psychiatric: Patient has a normal mood and affect. behavior is normal. Judgment and thought content normal.  Skin senile purpura , scars from biopsy and also skin damage  Recent Results (from the past 2160 hour(s))  POCT HgB A1C     Status: Abnormal   Collection Time: 03/01/21  4:06 PM  Result Value Ref Range   Hemoglobin A1C 6.6 (A) 4.0 - 5.6 %   HbA1c POC (<> result, manual entry)     HbA1c, POC (prediabetic range)     HbA1c, POC (controlled diabetic range)      PHQ2/9: Depression screen [facility] 2/9 03/01/2021 01/26/2021 11/17/2020 10/19/2020 06/17/2020  Decreased Interest 0 0 0 0 0  Down, Depressed, Hopeless 0 0 0 0 0  PHQ - 2 Score 0 0 0 0 0  Altered sleeping - - - - -  Tired, decreased energy - - - - -  Change in appetite - - - - -  Feeling bad or failure about yourself  - - - - -  Trouble concentrating - - - - -  Moving slowly or fidgety/restless - - - - -   Suicidal thoughts - - - - -  PHQ-9 Score - - - - -  Difficult doing work/chores - - - - -  Some recent data might be hidden    phq 9 is negative   Fall Risk: Fall Risk  03/01/2021 01/26/2021 11/17/2020 10/19/2020 06/17/2020  Falls in the past year? 1 0 0 0 0  Number falls in past yr: 0 0 0 0 0  Injury with Fall? 0 0 0 0 0  Risk for fall due to : - - No Fall Risks - -  Risk for fall due to: Comment - - - - -  Follow up - Falls prevention discussed Falls prevention discussed - -      Functional Status Survey: Is the patient deaf or have difficulty hearing?: No Does the patient have difficulty seeing, even when wearing glasses/contacts?: No Does the patient have difficulty concentrating, remembering, or making decisions?: No Does the patient have difficulty walking or climbing stairs?: No Does the patient have difficulty dressing or bathing?: No Does the patient have difficulty doing errands alone such as visiting a doctor's office or shopping?: No   Assessment & Plan  1. Controlled type 2 diabetes mellitus with microalbuminuria, with long-term current use of insulin (HCC)  - POCT HgB A1C - Urine Microalbumin w/creat. ratio - amLODipine-benazepril (LOTREL) 10-40 MG capsule; Take 1 capsule by mouth daily. In place of amlodipine 5 mg and lisinopril 40 mg  Dispense: 90 capsule; Refill: 1 - metFORMIN (GLUCOPHAGE XR) 500 MG 24 hr tablet; Take 1 tablet (500 mg total) by mouth daily with breakfast.  Dispense: 90 tablet; Refill: 1 - JARDIANCE 10 MG TABS tablet; Take 1 tablet (10 mg total) by mouth daily.  Dispense: 90 tablet; Refill: 1  2. GERD without esophagitis  - omeprazole (PRILOSEC) 40 MG capsule; Take 1 capsule (40 mg total) by mouth daily.  Dispense: 90 capsule; Refill: 1  3. Dyslipidemia associated with type 2 diabetes mellitus ([place])   4. Dyslipidemia  - atorvastatin (LIPITOR) 10 MG tablet; Take 1 tablet (10 mg total) by mouth daily.  Dispense: 90 tablet; Refill: 1  5.  Controlled gout  - allopurinol (ZYLOPRIM) 100 MG tablet; Take 1 tablet (100 mg total) by mouth 2 (two) times daily.  Dispense: 180 tablet; Refill: 1  6. Senile purpura (HCC)   7. Claudication ([place])   8. BPH associated with nocturia   9. History of MI (myocardial infarction)   10. Hypertension associated with stage 3a chronic kidney disease due to type 2 diabetes mellitus (HCC)  - amLODipine-benazepril (LOTREL) 10-40 MG capsule; Take 1 capsule by mouth daily. In place of amlodipine 5 mg and lisinopril 40 mg  Dispense: 90 capsule; Refill: 1  Discontinue lisinopril and norvasc 5 mg bid and start lotrel

## 2021-03-02 ENCOUNTER — Ambulatory Visit: Payer: Medicare HMO | Admitting: Family Medicine

## 2021-03-02 DIAGNOSIS — H401132 Primary open-angle glaucoma, bilateral, moderate stage: Secondary | ICD-10-CM | POA: Diagnosis not present

## 2021-03-02 DIAGNOSIS — Z0100 Encounter for examination of eyes and vision without abnormal findings: Secondary | ICD-10-CM | POA: Diagnosis not present

## 2021-03-02 LAB — MICROALBUMIN / CREATININE URINE RATIO
Creatinine, Urine: 51 mg/dL (ref 20–320)
Microalb Creat Ratio: 76 mcg/mg creat — ABNORMAL HIGH (ref <30)
Microalb, Ur: 3.9 mg/dL

## 2021-03-02 LAB — HM DIABETES EYE EXAM

## 2021-03-03 ENCOUNTER — Ambulatory Visit: Payer: Medicare HMO | Admitting: Family Medicine

## 2021-03-17 DIAGNOSIS — L821 Other seborrheic keratosis: Secondary | ICD-10-CM | POA: Diagnosis not present

## 2021-03-17 DIAGNOSIS — D2272 Melanocytic nevi of left lower limb, including hip: Secondary | ICD-10-CM | POA: Diagnosis not present

## 2021-03-17 DIAGNOSIS — D485 Neoplasm of uncertain behavior of skin: Secondary | ICD-10-CM | POA: Diagnosis not present

## 2021-03-17 DIAGNOSIS — C44311 Basal cell carcinoma of skin of nose: Secondary | ICD-10-CM | POA: Diagnosis not present

## 2021-03-17 DIAGNOSIS — D2261 Melanocytic nevi of right upper limb, including shoulder: Secondary | ICD-10-CM | POA: Diagnosis not present

## 2021-03-17 DIAGNOSIS — L570 Actinic keratosis: Secondary | ICD-10-CM | POA: Diagnosis not present

## 2021-03-17 DIAGNOSIS — Z85828 Personal history of other malignant neoplasm of skin: Secondary | ICD-10-CM | POA: Diagnosis not present

## 2021-03-17 DIAGNOSIS — X32XXXA Exposure to sunlight, initial encounter: Secondary | ICD-10-CM | POA: Diagnosis not present

## 2021-03-17 DIAGNOSIS — Z85820 Personal history of malignant melanoma of skin: Secondary | ICD-10-CM | POA: Diagnosis not present

## 2021-03-19 DIAGNOSIS — Z794 Long term (current) use of insulin: Secondary | ICD-10-CM | POA: Diagnosis not present

## 2021-03-19 DIAGNOSIS — M1712 Unilateral primary osteoarthritis, left knee: Secondary | ICD-10-CM | POA: Diagnosis not present

## 2021-03-19 DIAGNOSIS — R809 Proteinuria, unspecified: Secondary | ICD-10-CM | POA: Diagnosis not present

## 2021-03-19 DIAGNOSIS — M25562 Pain in left knee: Secondary | ICD-10-CM | POA: Diagnosis not present

## 2021-03-19 DIAGNOSIS — E1129 Type 2 diabetes mellitus with other diabetic kidney complication: Secondary | ICD-10-CM | POA: Diagnosis not present

## 2021-04-01 DIAGNOSIS — H6123 Impacted cerumen, bilateral: Secondary | ICD-10-CM | POA: Diagnosis not present

## 2021-04-01 DIAGNOSIS — H903 Sensorineural hearing loss, bilateral: Secondary | ICD-10-CM | POA: Diagnosis not present

## 2021-04-26 DIAGNOSIS — M950 Acquired deformity of nose: Secondary | ICD-10-CM | POA: Diagnosis not present

## 2021-04-26 DIAGNOSIS — L988 Other specified disorders of the skin and subcutaneous tissue: Secondary | ICD-10-CM | POA: Diagnosis not present

## 2021-04-26 DIAGNOSIS — L814 Other melanin hyperpigmentation: Secondary | ICD-10-CM | POA: Diagnosis not present

## 2021-04-26 DIAGNOSIS — C44311 Basal cell carcinoma of skin of nose: Secondary | ICD-10-CM | POA: Diagnosis not present

## 2021-04-29 DIAGNOSIS — E119 Type 2 diabetes mellitus without complications: Secondary | ICD-10-CM | POA: Diagnosis not present

## 2021-04-29 DIAGNOSIS — E1129 Type 2 diabetes mellitus with other diabetic kidney complication: Secondary | ICD-10-CM | POA: Diagnosis not present

## 2021-04-29 DIAGNOSIS — Z794 Long term (current) use of insulin: Secondary | ICD-10-CM | POA: Diagnosis not present

## 2021-04-29 DIAGNOSIS — R809 Proteinuria, unspecified: Secondary | ICD-10-CM | POA: Diagnosis not present

## 2021-04-29 LAB — HEMOGLOBIN A1C: Hemoglobin A1C: 6.7

## 2021-06-02 DIAGNOSIS — Z947 Corneal transplant status: Secondary | ICD-10-CM | POA: Diagnosis not present

## 2021-06-04 NOTE — Progress Notes (Signed)
Name: Tracy [name]   MRN: [id number]    DOB: April 13, 1930   Date:06/07/2021       Progress Note  Subjective  Chief Complaint  Follow Up  I connected with  Tracy [name]  on 06/07/21 at  9:40 AM EDT by a teleophone  application and verified that I am speaking with the correct person using two identifiers.  I discussed the limitations of evaluation and management by telemedicine and the availability of in person appointments. The patient expressed understanding and agreed to proceed with the virtual visit  Staff also discussed with the patient that there may be a patient responsible charge related to this service. Patient Location: at home  Provider Location: [facility] Additional Individuals present: alone   HPI  Claudication: he states he has noticed aching sensation on his thighs/feels tired when playing golf and unable to play 18 holes. He is walking on the treadmill for 30 minutes and also riding stationary bike for about 20 minutes daily , he has recently noticed that his toes have been cold in the morning and feels tingling. He had normal vascular studies, and likely symptoms from diabetic neuropathy , he states very mild and doesn't want any medications for it   GERD: we switched from Pepcid to PPI back in March 2021 , he is doing well now, no heartburn or indigestion. 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 He has been doing well since, denies chest pain, SOB or orthopnea. Denies decrease in exercise tolerance,  playing golf two to three times a week and also going to the gym   DMII: he sees Dr. Kenton Kingfisher at [facility] usually once a year, , he is on Metformin 500 mg ER, Jardiance and also Lantus at 42-46 advised to stay on 44 daily.FSBS is usually between 99-130 He has CKI and dyslipidemia, taking ACE and his last urine micro was slightly higher at 76  he also takes a statin A1C  done in August was 6.7 %    Hyperlipidemia: taking medication and denies side effects, LDL is at goal. No myalgia. He has leg cramps at night but very seldom , doing better since he started to drink more water   HTN: under control with medication, no chest pain or palpitation, no dizziness.He needs refills of medication    Hearing loss sudden, happened after an URI back in Dec 2020  seen by Dr. Tami Ribas and had steroid and is using a hearing aid - but did not wear it today because it is bothersome in a smaller room .    Osteoarthritis: he has OA of left thumb, intermittent medial knee pain when moving/walking but responding to topical medication  Stable, pain only present when it rains Recently noticing pain on left medial knee, no effusion or redness, he has been taking ibuprofen daily advised to switch to Tylenol . He has gone to [facility] but would like to go somewhere else . He will try Tylenol and will contact me back to get referral to [facility]    Senile purpura: on arms stable and reassurance given    History of skin cancer: he is compliant with dermatologist visits Had another basal cell removed from his nose, he states it is healing    Anemia of chronic disease/CKI stage III: reviewed last labs, urine micro 76 he is  on lisinopril, he denies pruritus or decrease in urinary output .    BPH: sees Urologist, no nocturia, symptoms controlled. He is happy with medications - takes Cardura daily . He is 16 and we will not check PSA  Rhinorrhea: he has been taking otc loratadine   Patient Active Problem List   Diagnosis Date Noted   Numbness and tingling of both feet 10/23/2020   Chronic ischemic heart disease 10/19/2020   Hypertension associated with stage 3a chronic kidney disease due to type 2 diabetes mellitus ([place]) 10/19/2020   Anemia of chronic disease 11/12/2019   Hearing loss on right 11/26/2018   Labyrinthitis of right ear 10/25/2018   Spinal stenosis of lumbar region  09/13/2017   Spinal stenosis of lumbar region with neurogenic claudication 05/03/2017   Type 2 diabetes mellitus without complication, with long-term current use of insulin ([place]) 05/03/2017   Left hip pain 07/28/2016   ALT (SGPT) level raised 07/13/2015   At risk for falling 07/13/2015   Arthritis of hand, degenerative 07/13/2015   Gout 07/13/2015   Glaucoma 07/13/2015   Diabetic neuropathy ([place]) 07/13/2015   Hypertension 04/09/2015   Type 2 diabetes mellitus with microalbuminuria, with long-term current use of insulin ([place]) 04/09/2015   Dyslipidemia 04/09/2015   Hypertriglyceridemia 04/09/2015   Benign fibroma of prostate 04/09/2015   Acid reflux 04/09/2015   History of melanoma excision 04/09/2015   History of squamous cell carcinoma 05/23/2013   H/O adenomatous polyp of colon 08/25/2010   History of MI (myocardial infarction) 08/27/1979    Past Surgical History:  Procedure Laterality Date   CATARACT EXTRACTION W/ INTRAOCULAR LENS  IMPLANT, BILATERAL     CIRCUMCISION     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/10/2019   GANGLION CYST EXCISION     LEFT HAND   LUMBAR LAMINECTOMY/DECOMPRESSION MICRODISCECTOMY  09/13/2017   Procedure: BILATERAL Lumbar three-four , Left  Lumbar four-five  AND Left  L5-S1, LAMINECTOMY/FORAMINOTOMY;  Surgeon: [name], MD;  Location: [place];  Service: Neurosurgery;;   MELANOMA EXCISION  07/2011   TONSILLECTOMY     VASECTOMY      Family History  Problem Relation Age of Onset   Heart disease Mother    Diabetes Mother        type 2   Angina Mother    Heart disease Brother    Pneumonia Father    Heart attack Father    Alcoholism Daughter    Cirrhosis Daughter     Social History   Socioeconomic History   Marital status: Married    Spouse name: Tracy [name]   Number of children: 4   Years of education: Not on file   Highest education level: Bachelor's degree (e.g., BA, AB, BS)  Occupational History   Occupation: Retired  Tobacco Use   Smoking status:  Former    Packs/day: 2.00    Years: 13.00    Pack years: 26.00    Types: Cigarettes    Quit date: 1963    Years since quitting: 59.7   Smokeless tobacco: Never   Tobacco comments:    smoking cessation materials not required  Vaping Use   Vaping Use: Never used  Substance and Sexual Activity   Alcohol use: Yes    Alcohol/week: 2.0 standard drinks    Types: 2 Shots of liquor per week    Comment: occasional   Drug use: No   Sexual activity: Not Currently  Other Topics Concern   Not on file  Social History Narrative  Daughter Manuela Schwartz passed away 09/29/18.   Social Determinants of Health   Financial Resource Strain: Low Risk    Difficulty of Paying Living Expenses: Not hard at all  Food Insecurity: No Food Insecurity   Worried About Charity fundraiser in the Last Year: Never true   [place] in the Last Year: Never true  Transportation Needs: No Transportation Needs   Lack of Transportation (Medical): No   Lack of Transportation (Non-Medical): No  Physical Activity: Sufficiently Active   Days of Exercise per Week: 7 days   Minutes of Exercise per Session: 30 min  Stress: No Stress Concern Present   Feeling of Stress : Not at all  Social Connections: Moderately Integrated   Frequency of Communication with Friends and Family: More than three times a week   Frequency of Social Gatherings with Friends and Family: Three times a week   Attends Religious Services: Never   Active Member of Clubs or Organizations: Yes   Attends Archivist Meetings: 1 to 4 times per year   Marital Status: Married  Human resources officer Violence: Not At Risk   Fear of Current or Ex-Partner: No   Emotionally Abused: No   Physically Abused: No   Sexually Abused: No     Current Outpatient Medications:    Accu-Chek FastClix Lancets MISC, CHECK  BLOOD  SUGAR TWICE DAILY, Disp: 204 each, Rfl: 1   allopurinol (ZYLOPRIM) 100 MG tablet, Take 1 tablet (100 mg total) by mouth 2 (two) times  daily., Disp: 180 tablet, Rfl: 1   amLODipine-benazepril (LOTREL) 10-40 MG capsule, Take 1 capsule by mouth daily. In place of amlodipine 5 mg and lisinopril 40 mg, Disp: 90 capsule, Rfl: 1   aspirin 81 MG tablet, Take 81 mg by mouth., Disp: , Rfl:    atorvastatin (LIPITOR) 10 MG tablet, Take 1 tablet (10 mg total) by mouth daily., Disp: 90 tablet, Rfl: 1   Blood Glucose Calibration (ACCU-CHEK AVIVA) SOLN, 1 Bottle by In Vitro route as needed., Disp: 1 each, Rfl: 2   Blood Glucose Monitoring Suppl (ACCU-CHEK NANO SMARTVIEW) W/DEVICE KIT, USE AS DIRECTED, Disp: 1 kit, Rfl: 0   Cholecalciferol (VITAMIN D) 2000 units tablet, Take 2,000 Units by mouth daily., Disp: , Rfl:    dorzolamide-timolol (COSOPT) 22.3-6.8 MG/ML ophthalmic solution, Place 1 drop into the right eye 2 (two) times daily., Disp: , Rfl:    doxazosin (CARDURA) 4 MG tablet, Take 1 tablet (4 mg total) by mouth at bedtime., Disp: 90 tablet, Rfl: 3   finasteride (PROSCAR) 5 MG tablet, Take 1 tablet (5 mg total) by mouth at bedtime., Disp: 90 tablet, Rfl: 3   glucose blood (COOL BLOOD GLUCOSE TEST STRIPS) test strip, Use as instructed, Disp: 100 each, Rfl: 12   insulin glargine (LANTUS SOLOSTAR) 100 UNIT/ML Solostar Pen, Inject 46 Units into the skin daily., Disp: 30 mL, Rfl: 0   Insulin Pen Needle 31G X 5 MM MISC, 1 Device by Does not apply route as needed., Disp: 120 each, Rfl: 2   JARDIANCE 10 MG TABS tablet, Take 1 tablet (10 mg total) by mouth daily., Disp: 90 tablet, Rfl: 1   Multiple Vitamins-Minerals (CENTRUM SILVER 50+MEN) TABS, Take 1 tablet by mouth daily., Disp: , Rfl:    omega-3 fish oil (MAXEPA) 1000 MG CAPS capsule, Take 2 capsules by mouth daily. , Disp: , Rfl:    omeprazole (PRILOSEC) 40 MG capsule, Take 1 capsule (40 mg total) by mouth daily., Disp: 90  capsule, Rfl: 1   vitamin B-12 (CYANOCOBALAMIN) 500 MCG tablet, Take 1 tablet by mouth daily., Disp: , Rfl:   Allergies  Allergen Reactions   Tetracyclines & Related  Other (See Comments)    UNSPECIFIED REACTION  other    I personally reviewed active problem list, medication list, allergies, family history, social history, health maintenance with the patient/caregiver today.   ROS  Ten systems reviewed and is negative except as mentioned in HPI   Objective  Virtual encounter, vitals not obtained.  Body mass index is 24.82 kg/m.  Physical Exam  Awake, alert and oriented   PHQ2/9: Depression screen [facility] 2/9 03/01/2021 01/26/2021 11/17/2020 10/19/2020 06/17/2020  Decreased Interest 0 0 0 0 0  Down, Depressed, Hopeless 0 0 0 0 0  PHQ - 2 Score 0 0 0 0 0  Altered sleeping - - - - -  Tired, decreased energy - - - - -  Change in appetite - - - - -  Feeling bad or failure about yourself  - - - - -  Trouble concentrating - - - - -  Moving slowly or fidgety/restless - - - - -  Suicidal thoughts - - - - -  PHQ-9 Score - - - - -  Difficult doing work/chores - - - - -  Some recent data might be hidden   PHQ-2/9 Result is negative.    Fall Risk: Fall Risk  06/07/2021 03/01/2021 01/26/2021 11/17/2020 10/19/2020  Falls in the past year? 0 1 0 0 0  Number falls in past yr: - 0 0 0 0  Injury with Fall? - 0 0 0 0  Risk for fall due to : - - - No Fall Risks -  Risk for fall due to: Comment - - - - -  Follow up Falls prevention discussed - Falls prevention discussed Falls prevention discussed -     Assessment & Plan  1. Claudication [facility])  Doing well   2. Senile purpura ([place])  Reassurance given  3. Dyslipidemia associated with type 2 diabetes mellitus (HCC)  - Lipid panel  4. History of MI (myocardial infarction)   5. BPH associated with nocturia   6. Hypertension associated with stage 3a chronic kidney disease due to type 2 diabetes mellitus ([place])   7. Vitamin D deficiency   8. Anemia of chronic disease   9. Essential hypertension  - COMPLETE METABOLIC PANEL WITH GFR  10. GERD without esophagitis   11. Dyslipidemia   12.  Controlled gout   13. Long-term use of high-risk medication  - CBC with Differential/Platelet - COMPLETE METABOLIC PANEL WITH GFR - Vitamin B12   I discussed the assessment and treatment plan with the patient. The patient was provided an opportunity to ask questions and all were answered. The patient agreed with the plan and demonstrated an understanding of the instructions.  The patient was advised to call back or seek an in-person evaluation if the symptoms worsen or if the condition fails to improve as anticipated.  I provided 25  minutes of non-face-to-face time during this encounter.

## 2021-06-07 ENCOUNTER — Telehealth (INDEPENDENT_AMBULATORY_CARE_PROVIDER_SITE_OTHER): Payer: Medicare HMO | Admitting: Family Medicine

## 2021-06-07 ENCOUNTER — Encounter: Payer: Self-pay | Admitting: Family Medicine

## 2021-06-07 ENCOUNTER — Other Ambulatory Visit: Payer: Self-pay

## 2021-06-07 VITALS — Ht 70.0 in | Wt 173.0 lb

## 2021-06-07 DIAGNOSIS — N401 Enlarged prostate with lower urinary tract symptoms: Secondary | ICD-10-CM

## 2021-06-07 DIAGNOSIS — D638 Anemia in other chronic diseases classified elsewhere: Secondary | ICD-10-CM | POA: Diagnosis not present

## 2021-06-07 DIAGNOSIS — I129 Hypertensive chronic kidney disease with stage 1 through stage 4 chronic kidney disease, or unspecified chronic kidney disease: Secondary | ICD-10-CM

## 2021-06-07 DIAGNOSIS — I10 Essential (primary) hypertension: Secondary | ICD-10-CM | POA: Diagnosis not present

## 2021-06-07 DIAGNOSIS — E1122 Type 2 diabetes mellitus with diabetic chronic kidney disease: Secondary | ICD-10-CM | POA: Diagnosis not present

## 2021-06-07 DIAGNOSIS — E559 Vitamin D deficiency, unspecified: Secondary | ICD-10-CM | POA: Diagnosis not present

## 2021-06-07 DIAGNOSIS — N1831 Chronic kidney disease, stage 3a: Secondary | ICD-10-CM

## 2021-06-07 DIAGNOSIS — E1169 Type 2 diabetes mellitus with other specified complication: Secondary | ICD-10-CM | POA: Diagnosis not present

## 2021-06-07 DIAGNOSIS — Z79899 Other long term (current) drug therapy: Secondary | ICD-10-CM

## 2021-06-07 DIAGNOSIS — I252 Old myocardial infarction: Secondary | ICD-10-CM

## 2021-06-07 DIAGNOSIS — I739 Peripheral vascular disease, unspecified: Secondary | ICD-10-CM

## 2021-06-07 DIAGNOSIS — E785 Hyperlipidemia, unspecified: Secondary | ICD-10-CM

## 2021-06-07 DIAGNOSIS — R351 Nocturia: Secondary | ICD-10-CM

## 2021-06-07 DIAGNOSIS — D692 Other nonthrombocytopenic purpura: Secondary | ICD-10-CM

## 2021-06-07 DIAGNOSIS — M109 Gout, unspecified: Secondary | ICD-10-CM

## 2021-06-07 DIAGNOSIS — K219 Gastro-esophageal reflux disease without esophagitis: Secondary | ICD-10-CM

## 2021-06-14 ENCOUNTER — Ambulatory Visit (INDEPENDENT_AMBULATORY_CARE_PROVIDER_SITE_OTHER): Payer: Medicare HMO

## 2021-06-14 ENCOUNTER — Other Ambulatory Visit: Payer: Self-pay

## 2021-06-14 DIAGNOSIS — Z23 Encounter for immunization: Secondary | ICD-10-CM | POA: Diagnosis not present

## 2021-06-14 DIAGNOSIS — I10 Essential (primary) hypertension: Secondary | ICD-10-CM | POA: Diagnosis not present

## 2021-06-14 DIAGNOSIS — Z79899 Other long term (current) drug therapy: Secondary | ICD-10-CM | POA: Diagnosis not present

## 2021-06-14 DIAGNOSIS — E785 Hyperlipidemia, unspecified: Secondary | ICD-10-CM | POA: Diagnosis not present

## 2021-06-14 DIAGNOSIS — E1169 Type 2 diabetes mellitus with other specified complication: Secondary | ICD-10-CM | POA: Diagnosis not present

## 2021-06-15 LAB — CBC WITH DIFFERENTIAL/PLATELET
Absolute Monocytes: 653 cells/uL (ref 200–950)
Basophils Absolute: 50 cells/uL (ref 0–200)
Basophils Relative: 0.7 %
Eosinophils Absolute: 355 cells/uL (ref 15–500)
Eosinophils Relative: 5 %
HCT: 42.3 % (ref 38.5–50.0)
Hemoglobin: 13.9 g/dL (ref 13.2–17.1)
Lymphs Abs: 1803 cells/uL (ref 850–3900)
MCH: 32.3 pg (ref 27.0–33.0)
MCHC: 32.9 g/dL (ref 32.0–36.0)
MCV: 98.4 fL (ref 80.0–100.0)
MPV: 11.4 fL (ref 7.5–12.5)
Monocytes Relative: 9.2 %
Neutro Abs: 4239 cells/uL (ref 1500–7800)
Neutrophils Relative %: 59.7 %
Platelets: 163 10*3/uL (ref 140–400)
RBC: 4.3 10*6/uL (ref 4.20–5.80)
RDW: 12.9 % (ref 11.0–15.0)
Total Lymphocyte: 25.4 %
WBC: 7.1 10*3/uL (ref 3.8–10.8)

## 2021-06-15 LAB — VITAMIN B12: Vitamin B-12: 1032 pg/mL (ref 200–1100)

## 2021-06-15 LAB — COMPLETE METABOLIC PANEL WITH GFR
AG Ratio: 1.9 (calc) (ref 1.0–2.5)
ALT: 20 U/L (ref 9–46)
AST: 16 U/L (ref 10–35)
Albumin: 4.3 g/dL (ref 3.6–5.1)
Alkaline phosphatase (APISO): 54 U/L (ref 35–144)
BUN/Creatinine Ratio: 21 (calc) (ref 6–22)
BUN: 26 mg/dL — ABNORMAL HIGH (ref 7–25)
CO2: 27 mmol/L (ref 20–32)
Calcium: 9.1 mg/dL (ref 8.6–10.3)
Chloride: 105 mmol/L (ref 98–110)
Creat: 1.25 mg/dL — ABNORMAL HIGH (ref 0.70–1.22)
Globulin: 2.3 g/dL (calc) (ref 1.9–3.7)
Glucose, Bld: 176 mg/dL — ABNORMAL HIGH (ref 65–139)
Potassium: 4.5 mmol/L (ref 3.5–5.3)
Sodium: 140 mmol/L (ref 135–146)
Total Bilirubin: 0.5 mg/dL (ref 0.2–1.2)
Total Protein: 6.6 g/dL (ref 6.1–8.1)
eGFR: 55 mL/min/{1.73_m2} — ABNORMAL LOW (ref >=60)

## 2021-06-15 LAB — LIPID PANEL
Cholesterol: 113 mg/dL (ref <200)
HDL: 46 mg/dL (ref >=40)
LDL Cholesterol (Calc): 36 mg/dL (calc)
Non-HDL Cholesterol (Calc): 67 mg/dL (calc) (ref <130)
Total CHOL/HDL Ratio: 2.5 (calc) (ref <5.0)
Triglycerides: 266 mg/dL — ABNORMAL HIGH (ref <150)

## 2021-07-26 DIAGNOSIS — H903 Sensorineural hearing loss, bilateral: Secondary | ICD-10-CM | POA: Diagnosis not present

## 2021-07-26 DIAGNOSIS — H6123 Impacted cerumen, bilateral: Secondary | ICD-10-CM | POA: Diagnosis not present

## 2021-08-09 ENCOUNTER — Ambulatory Visit: Admission: RE | Admit: 2021-08-09 | Payer: Medicare HMO | Source: Home / Self Care | Admitting: *Deleted

## 2021-08-09 ENCOUNTER — Other Ambulatory Visit: Payer: Self-pay

## 2021-08-09 ENCOUNTER — Ambulatory Visit
Admission: RE | Admit: 2021-08-09 | Discharge: 2021-08-09 | Disposition: A | Discharge status: Home / Self Care | Payer: Medicare HMO | Attending: Internal Medicine | Admitting: Internal Medicine

## 2021-08-09 ENCOUNTER — Ambulatory Visit (INDEPENDENT_AMBULATORY_CARE_PROVIDER_SITE_OTHER): Payer: Medicare HMO | Admitting: Internal Medicine

## 2021-08-09 ENCOUNTER — Encounter: Payer: Self-pay | Admitting: Internal Medicine

## 2021-08-09 ENCOUNTER — Ambulatory Visit
Admission: RE | Admit: 2021-08-09 | Discharge: 2021-08-09 | Disposition: A | Discharge status: Home / Self Care | Payer: Medicare HMO | Source: Ambulatory Visit | Attending: Internal Medicine | Admitting: Internal Medicine

## 2021-08-09 ENCOUNTER — Ambulatory Visit: Payer: Self-pay

## 2021-08-09 VITALS — BP 126/78 | HR 99 | Temp 98.1°F | Resp 16 | Ht 70.0 in | Wt 171.4 lb

## 2021-08-09 DIAGNOSIS — M109 Gout, unspecified: Secondary | ICD-10-CM | POA: Diagnosis not present

## 2021-08-09 DIAGNOSIS — R600 Localized edema: Secondary | ICD-10-CM | POA: Diagnosis not present

## 2021-08-09 DIAGNOSIS — M79671 Pain in right foot: Secondary | ICD-10-CM | POA: Diagnosis not present

## 2021-08-09 MED ORDER — NAPROXEN 500 MG PO TABS
500.0000 mg | ORAL_TABLET | Freq: Two times a day (BID) | ORAL | 0 refills | Status: AC
Start: 2021-08-09 — End: 2021-08-19

## 2021-08-09 NOTE — Telephone Encounter (Signed)
Pt has an appt today with Dr Rosana Berger

## 2021-08-09 NOTE — Progress Notes (Signed)
Acute Office Visit  Subjective:    Patient ID: Tracy [name], male    DOB: 11-28-1929, 87 y.o.   MRN: [id number]  Chief Complaint  Patient presents with   Foot Pain    Heel feels knot (right) onset 3 days hx of gout    HPI Patient is in today for right heel pain. He does have a history of gout, currently taking Allopurinol 200 mg daily. Last gout attack 10 years ago, got a pill that helped it then but uncertain what it was. Pain started as a dull soreness in the right heel 1 week ago but worsened 3 days ago while he was playing golf. He denies any redness, swelling or warmth of the joint or any joint currently, although he was noticed a "knot" that is acutely painful. He has been on the Allopurinol for years and is compliant with his above dose. States he didn't consume rich foods over Thanksgiving or weekend.  HEEL PAIN Duration:  1 week Involved foot: right Mechanism of injury: unknown Location: right heel, feels like knot  Onset: gradual  Severity: moderate  Quality:  aching and sore Frequency: constant Radiation: yes, outward to foot Aggravating factors: weight bearing and walking  Alleviating factors: nothing and rest  Status: worse Treatments attempted: did take an over the counter medication for arthritis that did help mildly Relief with NSAIDs?:  Uncertain Weakness with weight bearing or walking: yes Morning stiffness: no Swelling: no Redness: no Bruising: no Paresthesias / decreased sensation: no  Fevers:no   Past Medical History:  Diagnosis Date   Arthritis    Cancer ([place])    SKIN CANCER melanoma 2 sites and multiple squamous cell   Diabetes mellitus without complication (HCC)    GERD (gastroesophageal reflux disease)    Glaucoma    Hyperlipidemia    Hypertension    Myocardial infarction ([place])    08/27/1979   Vertigo     Past Surgical History:  Procedure Laterality Date   CATARACT EXTRACTION W/ INTRAOCULAR LENS  IMPLANT, BILATERAL      CIRCUMCISION     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/10/2019   GANGLION CYST EXCISION     LEFT HAND   LUMBAR LAMINECTOMY/DECOMPRESSION MICRODISCECTOMY  09/13/2017   Procedure: BILATERAL Lumbar three-four , Left  Lumbar four-five  AND Left  L5-S1, LAMINECTOMY/FORAMINOTOMY;  Surgeon: [name], MD;  Location: [place];  Service: Neurosurgery;;   MELANOMA EXCISION  07/2011   TONSILLECTOMY     VASECTOMY      Family History  Problem Relation Age of Onset   Heart disease Mother    Diabetes Mother        type 2   Angina Mother    Heart disease Brother    Pneumonia Father    Heart attack Father    Alcoholism Daughter    Cirrhosis Daughter     Social History   Socioeconomic History   Marital status: Married    Spouse name: [name]   Number of children: 4   Years of education: Not on file   Highest education level: Bachelor's degree (e.g., BA, AB, BS)  Occupational History   Occupation: Retired  Tobacco Use   Smoking status: Former    Packs/day: 2.00    Years: 13.00    Pack years: 26.00    Types: Cigarettes    Quit date: 1963    Years since quitting: 59.9   Smokeless tobacco: Never   Tobacco comments:    smoking cessation materials not  required  Vaping Use   Vaping Use: Never used  Substance and Sexual Activity   Alcohol use: Yes    Alcohol/week: 2.0 standard drinks    Types: 2 Shots of liquor per week    Comment: occasional   Drug use: No   Sexual activity: Not Currently  Other Topics Concern   Not on file  Social History Narrative   Daughter Manuela Schwartz passed away 2018-10-01.   Social Determinants of Health   Financial Resource Strain: Low Risk    Difficulty of Paying Living Expenses: Not hard at all  Food Insecurity: No Food Insecurity   Worried About Charity fundraiser in the Last Year: Never true   [place] in the Last Year: Never true  Transportation Needs: No Transportation Needs   Lack of Transportation (Medical): No   Lack of Transportation (Non-Medical): No   Physical Activity: Sufficiently Active   Days of Exercise per Week: 7 days   Minutes of Exercise per Session: 30 min  Stress: No Stress Concern Present   Feeling of Stress : Not at all  Social Connections: Moderately Integrated   Frequency of Communication with Friends and Family: More than three times a week   Frequency of Social Gatherings with Friends and Family: Three times a week   Attends Religious Services: Never   Active Member of Clubs or Organizations: Yes   Attends Archivist Meetings: 1 to 4 times per year   Marital Status: Married  Human resources officer Violence: Not At Risk   Fear of Current or Ex-Partner: No   Emotionally Abused: No   Physically Abused: No   Sexually Abused: No    Outpatient Medications Prior to Visit  Medication Sig Dispense Refill   Accu-Chek FastClix Lancets MISC CHECK  BLOOD  SUGAR TWICE DAILY 204 each 1   allopurinol (ZYLOPRIM) 100 MG tablet Take 1 tablet (100 mg total) by mouth 2 (two) times daily. 180 tablet 1   amLODipine-benazepril (LOTREL) 10-40 MG capsule Take 1 capsule by mouth daily. In place of amlodipine 5 mg and lisinopril 40 mg 90 capsule 1   aspirin 81 MG tablet Take 81 mg by mouth.     atorvastatin (LIPITOR) 10 MG tablet Take 1 tablet (10 mg total) by mouth daily. 90 tablet 1   Blood Glucose Calibration (ACCU-CHEK AVIVA) SOLN 1 Bottle by In Vitro route as needed. 1 each 2   Blood Glucose Monitoring Suppl (ACCU-CHEK NANO SMARTVIEW) W/DEVICE KIT USE AS DIRECTED 1 kit 0   Cholecalciferol (VITAMIN D) 2000 units tablet Take 2,000 Units by mouth daily.     dorzolamide-timolol (COSOPT) 22.3-6.8 MG/ML ophthalmic solution Place 1 drop into the right eye 2 (two) times daily.     doxazosin (CARDURA) 4 MG tablet Take 1 tablet (4 mg total) by mouth at bedtime. 90 tablet 3   finasteride (PROSCAR) 5 MG tablet Take 1 tablet (5 mg total) by mouth at bedtime. 90 tablet 3   glucose blood (COOL BLOOD GLUCOSE TEST STRIPS) test strip Use as  instructed 100 each 12   insulin glargine (LANTUS SOLOSTAR) 100 UNIT/ML Solostar Pen Inject 46 Units into the skin daily. 30 mL 0   Insulin Pen Needle 31G X 5 MM MISC 1 Device by Does not apply route as needed. 120 each 2   JARDIANCE 10 MG TABS tablet Take 1 tablet (10 mg total) by mouth daily. 90 tablet 1   Multiple Vitamins-Minerals (CENTRUM SILVER 50+MEN) TABS Take 1 tablet by mouth  daily.     omega-3 fish oil (MAXEPA) 1000 MG CAPS capsule Take 2 capsules by mouth daily.      omeprazole (PRILOSEC) 40 MG capsule Take 1 capsule (40 mg total) by mouth daily. 90 capsule 1   vitamin B-12 (CYANOCOBALAMIN) 500 MCG tablet Take 1 tablet by mouth daily.     No facility-administered medications prior to visit.    Allergies  Allergen Reactions   Tetracyclines & Related Other (See Comments)    UNSPECIFIED REACTION  other    Review of Systems  Constitutional:  Negative for chills and fever.  Eyes:  Negative for visual disturbance.  Respiratory:  Negative for cough.   Cardiovascular:  Negative for chest pain.  Gastrointestinal:  Negative for abdominal pain and diarrhea.  Musculoskeletal:  Positive for arthralgias. Negative for joint swelling.  Skin: Negative.       Objective:    Physical Exam Constitutional:      Appearance: Normal appearance.  HENT:     Head: Normocephalic and atraumatic.  Eyes:     Conjunctiva/sclera: Conjunctivae normal.  Cardiovascular:     Rate and Rhythm: Normal rate and regular rhythm.  Pulmonary:     Effort: Pulmonary effort is normal.     Breath sounds: Normal breath sounds.  Musculoskeletal:        General: Tenderness present. No swelling.     Right lower leg: No edema.     Left lower leg: No edema.     Comments: Large 2x1 inch painful, hard, fixed mass at the insertion of the Achilles tendon. Appears to be gout tophi. No erythema or swelling present. Good ROM of ankle.   Skin:    General: Skin is warm and dry.  Neurological:     General: No focal  deficit present.     Mental Status: He is alert. Mental status is at baseline.  Psychiatric:        Mood and Affect: Mood normal.        Behavior: Behavior normal.    BP 126/78   Pulse 99   Temp 98.1 F (36.7 C)   Resp 16   Ht $R'5\' 10"'ts$  (1.778 m)   Wt 171 lb 6.4 oz (77.7 kg)   SpO2 98%   BMI 24.59 kg/m  Wt Readings from Last 3 Encounters:  06/07/21 173 lb (78.5 kg)  03/01/21 173 lb (78.5 kg)  01/26/21 175 lb (79.4 kg)    Health Maintenance Due  Topic Date Due   COVID-19 Vaccine (4 - Booster for Pfizer series) 08/03/2020    There are no preventive care reminders to display for this patient.   Lab Results  Component Value Date   TSH 2.90 11/28/2016   Lab Results  Component Value Date   WBC 7.1 06/14/2021   HGB 13.9 06/14/2021   HCT 42.3 06/14/2021   MCV 98.4 06/14/2021   PLT 163 06/14/2021   Lab Results  Component Value Date   NA 140 06/14/2021   K 4.5 06/14/2021   CO2 27 06/14/2021   GLUCOSE 176 (H) 06/14/2021   BUN 26 (H) 06/14/2021   CREATININE 1.25 (H) 06/14/2021   BILITOT 0.5 06/14/2021   ALKPHOS 45 08/30/2018   AST 16 06/14/2021   ALT 20 06/14/2021   PROT 6.6 06/14/2021   ALBUMIN 3.6 08/30/2018   CALCIUM 9.1 06/14/2021   ANIONGAP 8 08/30/2018   EGFR 55 (L) 06/14/2021   Lab Results  Component Value Date   CHOL 113 06/14/2021   Lab Results  Component Value Date   HDL 46 06/14/2021   Lab Results  Component Value Date   LDLCALC 36 06/14/2021   Lab Results  Component Value Date   TRIG 266 (H) 06/14/2021   Lab Results  Component Value Date   CHOLHDL 2.5 06/14/2021   Lab Results  Component Value Date   HGBA1C 6.7 04/29/2021       Assessment & Plan:   1. Acute gout of right ankle, unspecified cause/Pain of right heel: History and physical exam consistent with acute gout. Due to location over tendon insertion point an x-ray will be obtained. In the meantime he will be treated like acute gout with Naproxen 500 mg BID for 10 days. If  symptoms fail to improve or worsen he can follow up for a steroid injection and or Colchicine. He has a regularly scheduled follow up in January.    - DG Foot Complete Right; Future - naproxen (NAPROSYN) 500 MG tablet; Take 1 tablet (500 mg total) by mouth 2 (two) times daily with a meal for 10 days.  Dispense: 20 tablet; Refill: 0   [name], DO

## 2021-08-09 NOTE — Telephone Encounter (Signed)
Pt. Reports he started having right heel pain 1 week ago. Getting worse. Hurts to walk on foot. History of gout." It feels like gout, but it's not red." Appointment for today.    Reason for Disposition  [1] MODERATE pain (e.g., interferes with normal activities, limping) AND [2] present > 3 days  Answer Assessment - Initial Assessment Questions 1. ONSET: "When did the pain start?"      1 week ago 2. LOCATION: "Where is the pain located?"      Right heel 3. PAIN: "How bad is the pain?"    (Scale 1-10; or mild, moderate, severe)  - MILD (1-3): doesn't interfere with normal activities.   - MODERATE (4-7): interferes with normal activities (e.g., work or school) or awakens from sleep, limping.   - SEVERE (8-10): excruciating pain, unable to do any normal activities, unable to walk.      Now- 2 4. WORK OR EXERCISE: "Has there been any recent work or exercise that involved this part of the body?"      No 5. CAUSE: "What do you think is causing the foot pain?"     Unsure 6. OTHER SYMPTOMS: "Do you have any other symptoms?" (e.g., leg pain, rash, fever, numbness)     No 7. PREGNANCY: "Is there any chance you are pregnant?" "When was your last menstrual period?"     N/a  Protocols used: Foot Pain-A-AH

## 2021-08-09 NOTE — Patient Instructions (Addendum)
It was great seeing you today!  Plan discussed at today's visit: -Right foot x-ray today -In the meantime, we will treat with Naproxen 500 mg twice a day, do NOT take with any other anti-inflammatory medication (Aleve, Ibuprofen, Mobic, Advil), is ok to take Tylenol. Take medication with food.  Follow up in: as needed   Take care and let us know if you have any questions or concerns prior to your next visit.  Dr. Rosana Berger  Gout Gout is a condition that causes painful swelling of the joints. Gout is a type of inflammation of the joints (arthritis). This condition is caused by having too much uric acid in the body. Uric acid is a chemical that forms when the body breaks down substances called purines. Purines are important for building body proteins. When the body has too much uric acid, sharp crystals can form and build up inside the joints. This causes pain and swelling. Gout attacks can happen quickly and may be very painful (acute gout). Over time, the attacks can affect more joints and become more frequent (chronic gout). Gout can also cause uric acid to build up under the skin and inside the kidneys. What are the causes? This condition is caused by too much uric acid in your blood. This can happen because: Your kidneys do not remove enough uric acid from your blood. This is the most common cause. Your body makes too much uric acid. This can happen with some cancers and cancer treatments. It can also occur if your body is breaking down too many red blood cells (hemolytic anemia). You eat too many foods that are high in purines. These foods include organ meats and some seafood. Alcohol, especially beer, is also high in purines. A gout attack may be triggered by trauma or stress. What increases the risk? You are more likely to develop this condition if you: Have a family history of gout. Are male and middle-aged. Are male and have gone through menopause. Are obese. Frequently drink  alcohol, especially beer. Are dehydrated. Lose weight too quickly. Have an organ transplant. Have lead poisoning. Take certain medicines, including aspirin, cyclosporine, diuretics, levodopa, and niacin. Have kidney disease. Have a skin condition called psoriasis. What are the signs or symptoms? An attack of acute gout happens quickly. It usually occurs in just one joint. The most common place is the big toe. Attacks often start at night. Other joints that may be affected include joints of the feet, ankle, knee, fingers, wrist, or elbow. Symptoms of this condition may include: Severe pain. Warmth. Swelling. Stiffness. Tenderness. The affected joint may be very painful to touch. Shiny, red, or purple skin. Chills and fever. Chronic gout may cause symptoms more frequently. More joints may be involved. You may also have white or yellow lumps (tophi) on your hands or feet or in other areas near your joints. How is this diagnosed? This condition is diagnosed based on your symptoms, medical history, and physical exam. You may have tests, such as: Blood tests to measure uric acid levels. Removal of joint fluid with a thin needle (aspiration) to look for uric acid crystals. X-rays to look for joint damage. How is this treated? Treatment for this condition has two phases: treating an acute attack and preventing future attacks. Acute gout treatment may include medicines to reduce pain and swelling, including: NSAIDs. Steroids. These are strong anti-inflammatory medicines that can be taken by mouth (orally) or injected into a joint. Colchicine. This medicine relieves pain and swelling when  it is taken soon after an attack. It can be given by mouth or through an IV. Preventive treatment may include: Daily use of smaller doses of NSAIDs or colchicine. Use of a medicine that reduces uric acid levels in your blood. Changes to your diet. You may need to see a dietitian about what to eat and drink to  prevent gout. Follow these instructions at home: During a gout attack If directed, put ice on the affected area: Put ice in a plastic bag. Place a towel between your skin and the bag. Leave the ice on for 20 minutes, 2-3 times a day. Raise (elevate) the affected joint above the level of your heart as often as possible. Rest the joint as much as possible. If the affected joint is in your leg, you may be given crutches to use. Follow instructions from your health care provider about eating or drinking restrictions. Avoiding future gout attacks Follow a low-purine diet as told by your dietitian or health care provider. Avoid foods and drinks that are high in purines, including liver, kidney, anchovies, asparagus, herring, mushrooms, mussels, and beer. Maintain a healthy weight or lose weight if you are overweight. If you want to lose weight, talk with your health care provider. It is important that you do not lose weight too quickly. Start or maintain an exercise program as told by your health care provider. Eating and drinking Drink enough fluids to keep your urine pale yellow. If you drink alcohol: Limit how much you use to: 0-1 drink a day for women. 0-2 drinks a day for men. Be aware of how much alcohol is in your drink. In the [place] one drink equals one 12 oz bottle of beer (355 mL) one 5 oz glass of wine (148 mL), or one 1 oz glass of hard liquor (44 mL). General instructions Take over-the-counter and prescription medicines only as told by your health care provider. Do not drive or use heavy machinery while taking prescription pain medicine. Return to your normal activities as told by your health care provider. Ask your health care provider what activities are safe for you. Keep all follow-up visits as told by your health care provider. This is important. Contact a health care provider if you have: Another gout attack. Continuing symptoms of a gout attack after 10 days of  treatment. Side effects from your medicines. Chills or a fever. Burning pain when you urinate. Pain in your lower back or belly. Get help right away if you: Have severe or uncontrolled pain. Cannot urinate. Summary Gout is painful swelling of the joints caused by inflammation. The most common site of pain is the big toe, but it can affect other joints in the body. Medicines and dietary changes can help to prevent and treat gout attacks. This information is not intended to replace advice given to you by your health care provider. Make sure you discuss any questions you have with your health care provider. Document Revised: 03/09/2018 Document Reviewed: 03/21/2018 Elsevier Patient Education  [place].

## 2021-09-01 ENCOUNTER — Telehealth: Payer: Self-pay

## 2021-09-01 DIAGNOSIS — M1A9XX1 Chronic gout, unspecified, with tophus (tophi): Secondary | ICD-10-CM

## 2021-09-01 MED ORDER — ALLOPURINOL 300 MG PO TABS: 300.0000 mg | ORAL_TABLET | Freq: Every day | ORAL | 6 refills | Status: DC

## 2021-09-01 MED ORDER — INDOMETHACIN 50 MG PO CAPS: 50.0000 mg | ORAL_CAPSULE | Freq: Three times a day (TID) | ORAL | 1 refills | Status: DC

## 2021-09-01 MED ORDER — COLCHICINE 0.6 MG PO TABS: 0.6000 mg | ORAL_TABLET | Freq: Every day | ORAL | 1 refills | Status: DC

## 2021-09-01 NOTE — Telephone Encounter (Signed)
Copied from [place] [id number]. Topic: General - Other >> Sep 01, 2021  8:04 AM Leward Quan A wrote: Reason for CRM: Patient called in to inform Ms Rosana Berger that he is having another gout flare up and need something called into the pharmacy. Say that the naproxen (NAPROSYN) 500 MG tablet helped the last time but need something stronger. Please advise and call Ph#  [phone]

## 2021-09-01 NOTE — Telephone Encounter (Signed)
Left detailed vm °

## 2021-09-02 ENCOUNTER — Other Ambulatory Visit: Payer: Self-pay | Admitting: Family Medicine

## 2021-09-02 DIAGNOSIS — E1129 Type 2 diabetes mellitus with other diabetic kidney complication: Secondary | ICD-10-CM

## 2021-09-02 DIAGNOSIS — E1122 Type 2 diabetes mellitus with diabetic chronic kidney disease: Secondary | ICD-10-CM

## 2021-09-02 NOTE — Telephone Encounter (Signed)
Requested Prescriptions  Pending Prescriptions Disp Refills   amLODipine-benazepril (LOTREL) 10-40 MG capsule [Pharmacy Med Name: AMLODIPINE BESYLATE/BENAZEPRIL HYDROCHLORIDE 10-40 MG Capsule] 90 capsule 1    Sig: TAKE 1 CAPSULE BY MOUTH DAILY. (IN PLACE OF AMLODIPINE 5MG  AND LISINOPRIL 40MG )     Cardiovascular: CCB + ACEI Combos Failed - 09/02/2021  9:19 AM      Failed - Cr in normal range and within 180 days    Creat  Date Value Ref Range Status  06/14/2021 1.25 (H) 0.70 - 1.22 mg/dL Final   Creatinine, Urine  Date Value Ref Range Status  03/01/2021 51 20 - 320 mg/dL Final         Passed - K in normal range and within 180 days    Potassium  Date Value Ref Range Status  06/14/2021 4.5 3.5 - 5.3 mmol/L Final  02/11/2009 4.3 3.3 - 4.7 mEq/L Final         Passed - Patient is not pregnant      Passed - Last BP in normal range    BP Readings from Last 1 Encounters:  08/09/21 126/78         Passed - Valid encounter within last 6 months    Recent Outpatient Visits          3 weeks ago Acute gout of right ankle, unspecified cause   [facility] [name], DO   2 months ago Claudication [facility])   [facility] Steele Sizer, MD   6 months ago Controlled type 2 diabetes mellitus with microalbuminuria, with long-term current use of insulin [facility])   [facility] Steele Sizer, MD   7 months ago COVID-40   [facility] Steele Sizer, MD   10 months ago Controlled type 2 diabetes mellitus with microalbuminuria, with long-term current use of insulin [facility])   [facility] Steele Sizer, MD      Future Appointments            In 2 weeks Ancil Boozer, Drue Stager, MD [facility] [place]   In 2 months  [facility]

## 2021-09-21 DIAGNOSIS — D2271 Melanocytic nevi of right lower limb, including hip: Secondary | ICD-10-CM | POA: Diagnosis not present

## 2021-09-21 DIAGNOSIS — D2262 Melanocytic nevi of left upper limb, including shoulder: Secondary | ICD-10-CM | POA: Diagnosis not present

## 2021-09-21 DIAGNOSIS — D2261 Melanocytic nevi of right upper limb, including shoulder: Secondary | ICD-10-CM | POA: Diagnosis not present

## 2021-09-21 DIAGNOSIS — L570 Actinic keratosis: Secondary | ICD-10-CM | POA: Diagnosis not present

## 2021-09-21 DIAGNOSIS — Z85820 Personal history of malignant melanoma of skin: Secondary | ICD-10-CM | POA: Diagnosis not present

## 2021-09-21 DIAGNOSIS — D225 Melanocytic nevi of trunk: Secondary | ICD-10-CM | POA: Diagnosis not present

## 2021-09-21 DIAGNOSIS — Z85828 Personal history of other malignant neoplasm of skin: Secondary | ICD-10-CM | POA: Diagnosis not present

## 2021-09-21 NOTE — Progress Notes (Signed)
Name: Tracy [name]   MRN: [id number]    DOB: 12/20/1929   Date:09/22/2021       Progress Note  Subjective  Chief Complaint  Follow up   HPI  GERD: we switched from Pepcid to PPI back in March 2021 and only taking it a few times a week  , he is doing well now, no heartburn or indigestion.   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 He has been doing well since, denies chest pain, SOB or orthopnea. Denies decrease in exercise tolerance. He is still going to the gym, will play golf again in the Spring  Change in bowel movements: he used to have bowel movements daily , bristol 4, but over the past two months noticed difficulty having bowel movements and is bristol 7, no blood in stools, no pain, he has been using otc suppositories for hemorrhoids and seems to help , explained due to his age and change in bowel movements advised referral to GI. No change in appetite or weight loss    DMII: he sees Dr. Kenton Kingfisher at [facility] usually once a year, taking Lantus 46 units and Jardiance, A1C see has been controlled, today is 6.6 %. He has dyslipidemia but LDL is at goal, CKI stage III and is taking Jardiance and ACE   Hyperlipidemia: taking medication and denies side effects, LDL is at goal. No myalgia. Denies recent cramps   HTN: under control with medication, no chest pain or palpitation, no dizziness.   Hearing loss sudden, happened after an URI back in Dec 2020  seen by Dr. Tami Ribas and had steroid and is using a hearing aid - but did not wear it today because it is bothersome in a smaller room .    Osteoarthritis: he has OA of left thumb, intermittent medial knee pain when moving/walking but responding to topical medication Taking Tylenol prn   Gout: under control with allopurinol 300 mg daily    Senile purpura: on arms stable , reassurance given again    History of skin cancer: he is compliant with dermatologist visits Had another basal  cell removed from his nose, he states it is healing    BPH: sees Urologist, no nocturia, symptoms controlled. He is happy with medications - takes Cardura daily . He is 68 and we will not check PSA. Keep follow up with Urologist     Patient Active Problem List   Diagnosis Date Noted   Stage 3a chronic kidney disease ([place]) 09/22/2021   Numbness and tingling of both feet 10/23/2020   Chronic ischemic heart disease 10/19/2020   Hypertension associated with stage 3a chronic kidney disease due to type 2 diabetes mellitus ([place]) 10/19/2020   Anemia of chronic disease 11/12/2019   Hearing loss on right 11/26/2018   Labyrinthitis of right ear 10/25/2018   Spinal stenosis of lumbar region 09/13/2017   Spinal stenosis of lumbar region with neurogenic claudication 05/03/2017   Type 2 diabetes mellitus without complication, with long-term current use of insulin ([place]) 05/03/2017   Left hip pain 07/28/2016   ALT (SGPT) level raised 07/13/2015   At risk for falling 07/13/2015   Arthritis of hand, degenerative 07/13/2015   Gout 07/13/2015   Glaucoma 07/13/2015   Diabetic neuropathy ([place]) 07/13/2015   Hypertension 04/09/2015   Type 2 diabetes mellitus with microalbuminuria, with long-term current use of insulin ([place]) 04/09/2015   Dyslipidemia 04/09/2015   Hypertriglyceridemia 04/09/2015   Benign  fibroma of prostate 04/09/2015   Acid reflux 04/09/2015   History of melanoma excision 04/09/2015   History of squamous cell carcinoma 05/23/2013   H/O adenomatous polyp of colon 08/25/2010   History of MI (myocardial infarction) 08/27/1979    Past Surgical History:  Procedure Laterality Date   CATARACT EXTRACTION W/ INTRAOCULAR LENS  IMPLANT, BILATERAL     CIRCUMCISION     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/10/2019   GANGLION CYST EXCISION     LEFT HAND   LUMBAR LAMINECTOMY/DECOMPRESSION MICRODISCECTOMY  09/13/2017   Procedure: BILATERAL Lumbar three-four , Left  Lumbar four-five  AND Left  L5-S1,  LAMINECTOMY/FORAMINOTOMY;  Surgeon: [name], MD;  Location: [facility] OR;  Service: Neurosurgery;;   MELANOMA EXCISION  07/2011   TONSILLECTOMY     VASECTOMY      Family History  Problem Relation Age of Onset   Heart disease Mother    Diabetes Mother        type 2   Angina Mother    Heart disease Brother    Pneumonia Father    Heart attack Father    Alcoholism Daughter    Cirrhosis Daughter     Social History   Tobacco Use   Smoking status: Former    Packs/day: 2.00    Years: 13.00    Pack years: 26.00    Types: Cigarettes    Quit date: 1963    Years since quitting: 60.0   Smokeless tobacco: Never   Tobacco comments:    smoking cessation materials not required  Substance Use Topics   Alcohol use: Yes    Alcohol/week: 2.0 standard drinks    Types: 2 Shots of liquor per week    Comment: occasional     Current Outpatient Medications:    Accu-Chek FastClix Lancets MISC, CHECK  BLOOD  SUGAR TWICE DAILY, Disp: 204 each, Rfl: 1   amLODipine-benazepril (LOTREL) 10-40 MG capsule, TAKE 1 CAPSULE BY MOUTH DAILY. (IN PLACE OF AMLODIPINE 5MG AND LISINOPRIL 40MG), Disp: 90 capsule, Rfl: 1   aspirin 81 MG tablet, Take 81 mg by mouth., Disp: , Rfl:    atorvastatin (LIPITOR) 10 MG tablet, Take 1 tablet (10 mg total) by mouth daily., Disp: 90 tablet, Rfl: 1   Blood Glucose Calibration (ACCU-CHEK AVIVA) SOLN, 1 Bottle by In Vitro route as needed., Disp: 1 each, Rfl: 2   Blood Glucose Monitoring Suppl (ACCU-CHEK NANO SMARTVIEW) W/DEVICE KIT, USE AS DIRECTED, Disp: 1 kit, Rfl: 0   Cholecalciferol (VITAMIN D) 2000 units tablet, Take 2,000 Units by mouth daily., Disp: , Rfl:    colchicine 0.6 MG tablet, Take 1 tablet (0.6 mg total) by mouth daily. Take 2 tablets (1.2 mg) at onset of symptoms, followed by 1 tablet (0.6 mg) one hour later if symptoms do not resolve. Do not take more than 1.8 mg in 24 hours., Disp: 30 tablet, Rfl: 1   dorzolamide-timolol (COSOPT) 22.3-6.8 MG/ML ophthalmic  solution, Place 1 drop into the right eye 2 (two) times daily., Disp: , Rfl:    doxazosin (CARDURA) 4 MG tablet, Take 1 tablet (4 mg total) by mouth at bedtime., Disp: 90 tablet, Rfl: 3   finasteride (PROSCAR) 5 MG tablet, Take 1 tablet (5 mg total) by mouth at bedtime., Disp: 90 tablet, Rfl: 3   glucose blood (COOL BLOOD GLUCOSE TEST STRIPS) test strip, Use as instructed, Disp: 100 each, Rfl: 12   indomethacin (INDOCIN) 50 MG capsule, Take 1 capsule (50 mg total) by mouth 3 (three) times  daily with meals., Disp: 30 capsule, Rfl: 1   insulin glargine (LANTUS SOLOSTAR) 100 UNIT/ML Solostar Pen, Inject 46 Units into the skin daily., Disp: 30 mL, Rfl: 0   Insulin Pen Needle 31G X 5 MM MISC, 1 Device by Does not apply route as needed., Disp: 120 each, Rfl: 2   Multiple Vitamins-Minerals (CENTRUM SILVER 50+MEN) TABS, Take 1 tablet by mouth daily., Disp: , Rfl:    omega-3 fish oil (MAXEPA) 1000 MG CAPS capsule, Take 2 capsules by mouth daily. , Disp: , Rfl:    omeprazole (PRILOSEC) 40 MG capsule, Take 1 capsule (40 mg total) by mouth daily., Disp: 90 capsule, Rfl: 1   vitamin B-12 (CYANOCOBALAMIN) 500 MCG tablet, Take 1 tablet by mouth daily., Disp: , Rfl:    allopurinol (ZYLOPRIM) 300 MG tablet, Take 1 tablet (300 mg total) by mouth daily., Disp: 90 tablet, Rfl: 1   JARDIANCE 10 MG TABS tablet, Take 1 tablet (10 mg total) by mouth daily., Disp: 90 tablet, Rfl: 1  Allergies  Allergen Reactions   Tetracyclines & Related Other (See Comments)    UNSPECIFIED REACTION  other    I personally reviewed active problem list, medication list, allergies, family history, social history, health maintenance with the patient/caregiver today.   ROS  Constitutional: Negative for fever or weight change.  Respiratory: Negative for cough and shortness of breath.   Cardiovascular: Negative for chest pain or palpitations.  Gastrointestinal: Negative for abdominal pain, no bowel changes.  Musculoskeletal: Negative  for gait problem or joint swelling.  Skin: Negative for rash.  Neurological: Negative for dizziness or headache.  No other specific complaints in a complete review of systems (except as listed in HPI above).   Objective  Vitals:   09/22/21 0929  BP: 136/66  Pulse: 80  Resp: 16  Temp: 98.3 F (36.8 C)  SpO2: 96%  Weight: 171 lb (77.6 kg)  Height: 5' 10"  (1.778 m)    Body mass index is 24.54 kg/m.  Physical Exam  Constitutional: Patient appears well-developed and well-nourished. No distress.  HEENT: head atraumatic, normocephalic, pupils equal and reactive to light, neck supple, throat within normal limits Cardiovascular: Normal rate, regular rhythm and normal heart sounds.  No murmur heard. No BLE edema. Pulmonary/Chest: Effort normal and breath sounds normal. No respiratory distress. Abdominal: Soft.  There is no tenderness. Psychiatric: Patient has a normal mood and affect. behavior is normal. Judgment and thought content normal.   Recent Results (from the past 2160 hour(s))  POCT HgB A1C     Status: Abnormal   Collection Time: 09/22/21  9:41 AM  Result Value Ref Range   Hemoglobin A1C 6.6 (A) 4.0 - 5.6 %   HbA1c POC (<> result, manual entry)     HbA1c, POC (prediabetic range)     HbA1c, POC (controlled diabetic range)       PHQ2/9: Depression screen [facility] 2/9 09/22/2021 08/09/2021 03/01/2021 01/26/2021 11/17/2020  Decreased Interest 0 0 0 0 0  Down, Depressed, Hopeless 0 0 0 0 0  PHQ - 2 Score 0 0 0 0 0  Altered sleeping 0 0 - - -  Tired, decreased energy 0 0 - - -  Change in appetite 0 0 - - -  Feeling bad or failure about yourself  0 0 - - -  Trouble concentrating 0 0 - - -  Moving slowly or fidgety/restless 0 0 - - -  Suicidal thoughts 0 0 - - -  PHQ-9 Score 0 0 - - -  Difficult doing work/chores - Not difficult at all - - -  Some recent data might be hidden    phq 9 is negative   Fall Risk: Fall Risk  09/22/2021 08/09/2021 06/07/2021 03/01/2021 01/26/2021   Falls in the past year? 0 0 0 1 0  Number falls in past yr: 0 0 - 0 0  Injury with Fall? 0 0 - 0 0  Risk for fall due to : No Fall Risks - - - -  Risk for fall due to: Comment - - - - -  Follow up Falls prevention discussed - Falls prevention discussed - Falls prevention discussed      Functional Status Survey: Is the patient deaf or have difficulty hearing?: Yes Does the patient have difficulty seeing, even when wearing glasses/contacts?: No Does the patient have difficulty concentrating, remembering, or making decisions?: No Does the patient have difficulty walking or climbing stairs?: No Does the patient have difficulty dressing or bathing?: No Does the patient have difficulty doing errands alone such as visiting a doctor's office or shopping?: No    Assessment & Plan  1. Controlled type 2 diabetes mellitus with microalbuminuria, with long-term current use of insulin (HCC)  - POCT HgB A1C - JARDIANCE 10 MG TABS tablet; Take 1 tablet (10 mg total) by mouth daily.  Dispense: 90 tablet; Refill: 1  2. Hypertension associated with stage 3a chronic kidney disease due to type 2 diabetes mellitus ([place])   3. Change in bowel movement  - Ambulatory referral to Gastroenterology  4. Claudication ([place])   5. Senile purpura ([place])   6. Controlled gout   7. GERD without esophagitis   8. Dyslipidemia associated with type 2 diabetes mellitus ([place])   9. Chronic gout with tophus, unspecified cause, unspecified site  - allopurinol (ZYLOPRIM) 300 MG tablet; Take 1 tablet (300 mg total) by mouth daily.  Dispense: 90 tablet; Refill: 1  10. History of MI (myocardial infarction)   73. Vitamin D deficiency   12. Stage 3a chronic kidney disease ([place])

## 2021-09-22 ENCOUNTER — Encounter: Payer: Self-pay | Admitting: Family Medicine

## 2021-09-22 ENCOUNTER — Ambulatory Visit (INDEPENDENT_AMBULATORY_CARE_PROVIDER_SITE_OTHER): Payer: Medicare HMO | Admitting: Family Medicine

## 2021-09-22 VITALS — BP 136/66 | HR 80 | Temp 98.3°F | Resp 16 | Ht 70.0 in | Wt 171.0 lb

## 2021-09-22 DIAGNOSIS — I739 Peripheral vascular disease, unspecified: Secondary | ICD-10-CM | POA: Diagnosis not present

## 2021-09-22 DIAGNOSIS — R809 Proteinuria, unspecified: Secondary | ICD-10-CM | POA: Diagnosis not present

## 2021-09-22 DIAGNOSIS — E559 Vitamin D deficiency, unspecified: Secondary | ICD-10-CM

## 2021-09-22 DIAGNOSIS — I129 Hypertensive chronic kidney disease with stage 1 through stage 4 chronic kidney disease, or unspecified chronic kidney disease: Secondary | ICD-10-CM

## 2021-09-22 DIAGNOSIS — R198 Other specified symptoms and signs involving the digestive system and abdomen: Secondary | ICD-10-CM

## 2021-09-22 DIAGNOSIS — M109 Gout, unspecified: Secondary | ICD-10-CM

## 2021-09-22 DIAGNOSIS — E785 Hyperlipidemia, unspecified: Secondary | ICD-10-CM

## 2021-09-22 DIAGNOSIS — K219 Gastro-esophageal reflux disease without esophagitis: Secondary | ICD-10-CM | POA: Diagnosis not present

## 2021-09-22 DIAGNOSIS — N1831 Chronic kidney disease, stage 3a: Secondary | ICD-10-CM

## 2021-09-22 DIAGNOSIS — M1A9XX1 Chronic gout, unspecified, with tophus (tophi): Secondary | ICD-10-CM | POA: Diagnosis not present

## 2021-09-22 DIAGNOSIS — D692 Other nonthrombocytopenic purpura: Secondary | ICD-10-CM | POA: Diagnosis not present

## 2021-09-22 DIAGNOSIS — E1169 Type 2 diabetes mellitus with other specified complication: Secondary | ICD-10-CM | POA: Diagnosis not present

## 2021-09-22 DIAGNOSIS — E1129 Type 2 diabetes mellitus with other diabetic kidney complication: Secondary | ICD-10-CM | POA: Diagnosis not present

## 2021-09-22 DIAGNOSIS — E1122 Type 2 diabetes mellitus with diabetic chronic kidney disease: Secondary | ICD-10-CM | POA: Diagnosis not present

## 2021-09-22 DIAGNOSIS — I252 Old myocardial infarction: Secondary | ICD-10-CM

## 2021-09-22 DIAGNOSIS — Z794 Long term (current) use of insulin: Secondary | ICD-10-CM

## 2021-09-22 LAB — POCT GLYCOSYLATED HEMOGLOBIN (HGB A1C): Hemoglobin A1C: 6.6 % — AB (ref 4.0–5.6)

## 2021-09-22 MED ORDER — ALLOPURINOL 300 MG PO TABS: 300.0000 mg | ORAL_TABLET | Freq: Every day | ORAL | 1 refills | Status: DC

## 2021-09-22 MED ORDER — JARDIANCE 10 MG PO TABS: 10.0000 mg | ORAL_TABLET | Freq: Every day | ORAL | 1 refills | Status: DC

## 2021-09-24 DIAGNOSIS — H903 Sensorineural hearing loss, bilateral: Secondary | ICD-10-CM | POA: Diagnosis not present

## 2021-09-24 DIAGNOSIS — H93292 Other abnormal auditory perceptions, left ear: Secondary | ICD-10-CM | POA: Diagnosis not present

## 2021-09-24 DIAGNOSIS — H6123 Impacted cerumen, bilateral: Secondary | ICD-10-CM | POA: Diagnosis not present

## 2021-09-28 ENCOUNTER — Ambulatory Visit: Payer: Medicare HMO | Admitting: Podiatry

## 2021-10-08 ENCOUNTER — Other Ambulatory Visit: Payer: Self-pay | Admitting: Urology

## 2021-10-08 ENCOUNTER — Other Ambulatory Visit: Payer: Self-pay | Admitting: Family Medicine

## 2021-10-08 DIAGNOSIS — E785 Hyperlipidemia, unspecified: Secondary | ICD-10-CM

## 2021-10-08 DIAGNOSIS — R339 Retention of urine, unspecified: Secondary | ICD-10-CM

## 2021-10-11 ENCOUNTER — Other Ambulatory Visit: Payer: Self-pay

## 2021-10-11 DIAGNOSIS — M1A9XX1 Chronic gout, unspecified, with tophus (tophi): Secondary | ICD-10-CM

## 2021-10-11 NOTE — Telephone Encounter (Signed)
Pt came in states had another flare and has used up all his indomethacin and would like refill.  He thinks the allopurinol isn't working?  Wanted to see about increasing dose but I told him I think he is at the highest.  I told him to research foods and try to avoid that causes gout.

## 2021-10-13 ENCOUNTER — Other Ambulatory Visit: Payer: Self-pay | Admitting: Family Medicine

## 2021-10-13 ENCOUNTER — Telehealth: Payer: Self-pay

## 2021-10-13 MED ORDER — FEBUXOSTAT 40 MG PO TABS: 40.0000 mg | ORAL_TABLET | Freq: Every day | ORAL | 1 refills | Status: DC

## 2021-10-13 NOTE — Telephone Encounter (Signed)
Left message for patient to call back for scheduling per Dr.Sowles

## 2021-10-22 ENCOUNTER — Other Ambulatory Visit: Payer: Self-pay

## 2021-10-22 DIAGNOSIS — R339 Retention of urine, unspecified: Secondary | ICD-10-CM

## 2021-10-25 ENCOUNTER — Other Ambulatory Visit: Payer: Self-pay

## 2021-10-25 DIAGNOSIS — R339 Retention of urine, unspecified: Secondary | ICD-10-CM

## 2021-10-25 NOTE — Telephone Encounter (Signed)
Completed.

## 2021-10-27 ENCOUNTER — Other Ambulatory Visit: Payer: Self-pay | Admitting: Family Medicine

## 2021-10-27 DIAGNOSIS — R339 Retention of urine, unspecified: Secondary | ICD-10-CM

## 2021-10-27 NOTE — Telephone Encounter (Signed)
Requested medication (s) are due for refill today: yes  Requested medication (s) are on the active medication list: yes    Last refill: Cardura 09/08/20 #90  3 refills   Proscar 09/08/20  #90 3 refills  Future visit scheduled yes 03/23/22  Notes to clinic:Historical Provider, please review. Thank you  Requested Prescriptions  Pending Prescriptions Disp Refills   doxazosin (CARDURA) 4 MG tablet 90 tablet 3    Sig: Take 1 tablet (4 mg total) by mouth at bedtime.     Cardiovascular:  Alpha Blockers Passed - 10/27/2021 12:23 PM      Passed - Last BP in normal range    BP Readings from Last 1 Encounters:  09/22/21 136/66          Passed - Valid encounter within last 6 months    Recent Outpatient Visits           1 month ago Controlled type 2 diabetes mellitus with microalbuminuria, with long-term current use of insulin [facility])   [facility] [place], Drue Stager, MD   2 months ago Acute gout of right ankle, unspecified cause   [facility] [name], DO   4 months ago Claudication [facility])   [facility] [place], Drue Stager, MD   8 months ago Controlled type 2 diabetes mellitus with microalbuminuria, with long-term current use of insulin [facility])   [facility] Steele Sizer, MD   9 months ago COVID-64   [facility] Steele Sizer, MD       Future Appointments             In 3 weeks  [place]   In 4 months Steele Sizer, MD [facility]             finasteride (PROSCAR) 5 MG tablet 90 tablet 3    Sig: Take 1 tablet (5 mg total) by mouth at bedtime.     Urology: 5-alpha Reductase Inhibitors Failed - 10/27/2021 12:23 PM      Failed - PSA in normal range and within 360 days    PSA  Date Value Ref Range Status  11/28/2016 0.3 <=4.0 ng/mL Final    Comment:      The total PSA value from this assay system is standardized  against the WHO standard. The test result will be approximately 20% lower when compared to the equimolar-standardized total PSA (Beckman Coulter). Comparison of serial PSA results should be interpreted with this fact in mind.   This test was performed using the Siemens chemiluminescent method. Values obtained from different assay methods cannot be used interchangeably. PSA levels, regardless of value, should not be interpreted as absolute evidence of the presence or absence of disease.      Prostate Specific Ag, Serum  Date Value Ref Range Status  10/15/2015 0.4 0.0 - 4.0 ng/mL Final    Comment:    Roche ECLIA methodology. According to the American Urological Association, Serum PSA should decrease and remain at undetectable levels after radical prostatectomy. The AUA defines biochemical recurrence as an initial PSA value 0.2 ng/mL or greater followed by a subsequent confirmatory PSA value 0.2 ng/mL or greater. Values obtained with different assay methods or kits cannot be used interchangeably. Results cannot be interpreted as absolute evidence of the presence or absence of malignant disease.           Passed - Valid encounter within last 12 months  Recent Outpatient Visits           1 month ago Controlled type 2 diabetes mellitus with microalbuminuria, with long-term current use of insulin [facility])   [facility] [place], Drue Stager, MD   2 months ago Acute gout of right ankle, unspecified cause   [facility] [name], DO   4 months ago Claudication [facility])   [facility] Steele Sizer, MD   8 months ago Controlled type 2 diabetes mellitus with microalbuminuria, with long-term current use of insulin [facility])   [facility] Steele Sizer, MD   9 months ago COVID-19   [facility] Steele Sizer, MD       Future Appointments             In 3 weeks  [facility] [place]   In 4 months Steele Sizer, MD [facility]

## 2021-10-27 NOTE — Telephone Encounter (Signed)
Copied from [place] [phone]. Topic: Quick Communication - Rx Refill/Question >> Oct 27, 2021  9:17 AM Tessa Lerner A wrote: Medication: doxazosin (CARDURA) 4 MG tablet [[id number]]   finasteride (PROSCAR) 5 MG tablet [[id number]]   Has the patient contacted their pharmacy? Yes.  The pharmacy has made contact on the patient's behalf  (Agent: If no, request that the patient contact the pharmacy for the refill. If patient does not wish to contact the pharmacy document the reason why and proceed with request.) (Agent: If yes, when and what did the pharmacy advise?)  Preferred Pharmacy (with phone number or street name): [place], [place] [address] Phone: [phone] Fax: [phone] Hours: Not open 24 hours   Has the patient been seen for an appointment in the last year OR does the patient have an upcoming appointment? Yes.    Agent: Please be advised that RX refills may take up to 3 business days. We ask that you follow-up with your pharmacy.

## 2021-10-28 ENCOUNTER — Other Ambulatory Visit: Payer: Self-pay

## 2021-10-29 MED ORDER — DOXAZOSIN MESYLATE 4 MG PO TABS: 4.0000 mg | ORAL_TABLET | Freq: Every day | ORAL | 3 refills | Status: DC

## 2021-10-29 MED ORDER — FINASTERIDE 5 MG PO TABS: 5.0000 mg | ORAL_TABLET | Freq: Every day | ORAL | 3 refills | Status: DC

## 2021-11-10 ENCOUNTER — Ambulatory Visit (INDEPENDENT_AMBULATORY_CARE_PROVIDER_SITE_OTHER): Payer: Medicare HMO | Admitting: Gastroenterology

## 2021-11-10 ENCOUNTER — Other Ambulatory Visit: Payer: Self-pay

## 2021-11-10 ENCOUNTER — Encounter: Payer: Self-pay | Admitting: Gastroenterology

## 2021-11-10 VITALS — BP 142/102 | HR 83 | Temp 98.0°F | Wt 171.8 lb

## 2021-11-10 DIAGNOSIS — R194 Change in bowel habit: Secondary | ICD-10-CM

## 2021-11-10 NOTE — Progress Notes (Signed)
?  ?Jonathon Bellows MD, MRCP(U.K) ?[address]  ?[address]  ?[place], [place] [address]  ?Main: [phone]  ?Fax: [phone] ? ? ?Gastroenterology Consultation ? ?Referring Provider:     Steele Sizer, MD ?Primary Care Physician:  Steele Sizer, MD ?Primary Gastroenterologist:  Dr. Jonathon Bellows  ?Reason for Consultation:     Change in bowel habits ?      ? HPI:   ?Tracy [name] is a 88 y.o. y/o male referred for consultation & management  by Dr. Steele Sizer, MD.   ?He states that for the past 1 month he has noticed that he has been having unsatisfactory bowel movements.  He is unable to push out his stool.  Feels uncomfortable.  Not tried any laxatives.  No change in weight.  His stool shape has changed recently.  Last colonoscopy was 5 years back.  Regardless it was normal.  Was told he did not require any more.  Not on any blood thinners. ? ?06/14/2021 hemoglobin 13.9 g, B12 level 1032.  HbA1c 6.6 ? ?Past Medical History:  ?Diagnosis Date  ? Arthritis   ? Cancer [facility])   ? SKIN CANCER melanoma 2 sites and multiple squamous cell  ? Diabetes mellitus without complication ([place])   ? GERD (gastroesophageal reflux disease)   ? Glaucoma   ? Hyperlipidemia   ? Hypertension   ? Myocardial infarction [facility])   ? 08/27/1979  ? Vertigo   ? ? ?Past Surgical History:  ?Procedure Laterality Date  ? CATARACT EXTRACTION W/ INTRAOCULAR LENS  IMPLANT, BILATERAL    ? CIRCUMCISION    ? 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/10/2019  ? GANGLION CYST EXCISION    ? LEFT HAND  ? LUMBAR LAMINECTOMY/DECOMPRESSION MICRODISCECTOMY  09/13/2017  ? Procedure: BILATERAL Lumbar three-four , Left  Lumbar four-five  AND Left  L5-S1, LAMINECTOMY/FORAMINOTOMY;  Surgeon: [name], MD;  Location: [place];  Service: Neurosurgery;;  ? MELANOMA EXCISION  07/2011  ? TONSILLECTOMY    ? VASECTOMY    ? ? ?Prior to Admission medications   ?Medication Sig Start Date End Date Taking? Authorizing Provider  ?Accu-Chek FastClix Lancets MISC CHECK  BLOOD  SUGAR TWICE  DAILY 09/10/20   Ancil Boozer, Drue Stager, MD  ?amLODipine-benazepril (LOTREL) 10-40 MG capsule TAKE 1 CAPSULE BY MOUTH DAILY. (IN PLACE OF AMLODIPINE 5MG AND LISINOPRIL 40MG) 09/02/21   Steele Sizer, MD  ?aspirin 81 MG tablet Take 81 mg by mouth. 10/08/10   [provider]  ?atorvastatin (LIPITOR) 10 MG tablet TAKE 1 TABLET EVERY DAY 10/08/21   Steele Sizer, MD  ?Blood Glucose Calibration (ACCU-CHEK AVIVA) SOLN 1 Bottle by In Vitro route as needed. 07/31/20   Steele Sizer, MD  ?Blood Glucose Monitoring Suppl (ACCU-CHEK NANO SMARTVIEW) W/DEVICE KIT USE AS DIRECTED 05/15/15   Ashok Norris, MD  ?Cholecalciferol (VITAMIN D) 2000 units tablet Take 2,000 Units by mouth daily. 02/02/17   [provider]  ?colchicine 0.6 MG tablet Take 1 tablet (0.6 mg total) by mouth daily. Take 2 tablets (1.2 mg) at onset of symptoms, followed by 1 tablet (0.6 mg) one hour later if symptoms do not resolve. Do not take more than 1.8 mg in 24 hours. 09/01/21   [name], DO  ?dorzolamide-timolol (COSOPT) 22.3-6.8 MG/ML ophthalmic solution Place 1 drop into the right eye 2 (two) times daily.    [provider]  ?doxazosin (CARDURA) 4 MG tablet Take 1 tablet (4 mg total) by mouth at bedtime. 10/29/21   Steele Sizer, MD  ?febuxostat (  ULORIC) 40 MG tablet Take 1 tablet (40 mg total) by mouth daily. In place of Allopurinol for gout 10/13/21   Steele Sizer, MD  ?finasteride (PROSCAR) 5 MG tablet Take 1 tablet (5 mg total) by mouth at bedtime. 10/29/21   Steele Sizer, MD  ?glucose blood (COOL BLOOD GLUCOSE TEST STRIPS) test strip Use as instructed 03/03/15   Roselee Nova, MD  ?indomethacin (INDOCIN) 50 MG capsule Take 1 capsule (50 mg total) by mouth 3 (three) times daily with meals. 09/01/21   [name], DO  ?insulin glargine (LANTUS SOLOSTAR) 100 UNIT/ML Solostar Pen Inject 46 Units into the skin daily. 02/12/20   Steele Sizer, MD  ?Insulin Pen Needle 31G X 5 MM MISC 1 Device by Does not  apply route as needed. 11/06/20   Steele Sizer, MD  ?JARDIANCE 10 MG TABS tablet Take 1 tablet (10 mg total) by mouth daily. 09/22/21   Steele Sizer, MD  ?Multiple Vitamins-Minerals (CENTRUM SILVER 50+MEN) TABS Take 1 tablet by mouth daily.    [provider]  ?omega-3 fish oil (MAXEPA) 1000 MG CAPS capsule Take 2 capsules by mouth daily.     [provider]  ?omeprazole (PRILOSEC) 40 MG capsule Take 1 capsule (40 mg total) by mouth daily. 03/01/21   Steele Sizer, MD  ?vitamin B-12 (CYANOCOBALAMIN) 500 MCG tablet Take 1 tablet by mouth daily. 06/15/17   [provider]  ? ? ?Family History  ?Problem Relation Age of Onset  ? Heart disease Mother   ? Diabetes Mother   ?     type 2  ? Angina Mother   ? Heart disease Brother   ? Pneumonia Father   ? Heart attack Father   ? Alcoholism Daughter   ? Cirrhosis Daughter   ?  ? ?Social History  ? ?Tobacco Use  ? Smoking status: Former  ?  Packs/day: 2.00  ?  Years: 13.00  ?  Pack years: 26.00  ?  Types: Cigarettes  ?  Quit date: 1963  ?  Years since quitting: 60.2  ? Smokeless tobacco: Never  ? Tobacco comments:  ?  smoking cessation materials not required  ?Vaping Use  ? Vaping Use: Never used  ?Substance Use Topics  ? Alcohol use: Yes  ?  Alcohol/week: 2.0 standard drinks  ?  Types: 2 Shots of liquor per week  ?  Comment: occasional  ? Drug use: No  ? ? ?Allergies as of 11/10/2021 - Review Complete 11/10/2021  ?Allergen Reaction Noted  ? Tetracyclines & related Other (See Comments) 01/14/2013  ? ? ?Review of Systems:    ?All systems reviewed and negative except where noted in HPI. ? ? Physical Exam:  ?BP (!) 142/102   Pulse 83   Temp 98 ?F (36.7 ?C) (Oral)   Wt 171 lb 12.8 oz (77.9 kg)   BMI 24.65 kg/m?  ?No LMP for male patient. ?Psych:  Alert and cooperative. Normal mood and affect. ?General:   Alert,  Well-developed, well-nourished, pleasant and cooperative in NAD ?Head:  Normocephalic and atraumatic. ?Eyes:  Sclera clear, no icterus.    Conjunctiva pink. ?Ears:  Normal auditory acuity. ?Neurologic:  Alert and oriented x3;  grossly normal neurologically. ?Psych:  Alert and cooperative. Normal mood and affect. ? ?Imaging Studies: ?No results found. ? ?Assessment and Plan:  ? ?Tracy [name] is a 88 y.o. y/o male has been referred for change in bowel habits for the past 1 month.  New onset constipation. ? ?Plan ?  1.  Commence on MiraLAX 1 capful twice a day if no better in a week he should call my office and we will change medication with samples such as Linzess or Trulance ? ?2.  Colonoscopy to evaluate change in bowel habits ? ?I have discussed alternative options, risks & benefits,  which include, but are not limited to, bleeding, infection, perforation,respiratory complication & drug reaction.  The patient agrees with this plan & written consent will be obtained.   ? ? ?Follow up in as needed ? ?Dr Jonathon Bellows MD,MRCP(U.K) ? ?

## 2021-11-10 NOTE — Patient Instructions (Addendum)
If you are doing better, you could cancel your follow up appointment. ? ? ?Polyethylene Glycol Powder ?What is this medication? ?POLYETHYLENE GLYCOL (pol ee ETH i leen; GLYE col) prevents and treats occasional constipation. It works by softening the stool, making it easier to have a bowel movement. It belongs to a group of medications called laxatives. ?This medicine may be used for other purposes; ask your health care provider or pharmacist if you have questions. ?COMMON BRAND NAME(S): GaviLax, GIALAX, GlycoLax, Healthylax, MiraLax, Smooth LAX, Vita Health ?What should I tell my care team before I take this medication? ?They need to know if you have any of these conditions: ?History of blockage in your bowels ?Nausea ?Phenylketonuria ?Stomach or intestine problem ?Stomach pain ?Sudden change in bowel habit lasting more than 2 weeks ?Vomiting ?An unusual or allergic reaction to polyethylene glycol (PEG), other medications, foods, dyes, or preservatives ?Pregnant or trying to get pregnant ?Breast-feeding ?How should I use this medication? ?Take this medication by mouth. Take it as directed on the label. Add the right dose to 4 to 8 ounces or 120 to 240 mL of water, juice, soda, coffee or tea. Do not mix this medication with foods or other liquids. Do not combine with starch-based thickeners (e.g., flour, cornstarch, arrowroot, tapioca, xanthan gum). Mix well. Drink the solution. Do not use it more often than directed. ?Talk to your care team about the use of this medication in children. While it may be given to children as young as 16 years for selected conditions, precautions do apply. ?Overdosage: If you think you have taken too much of this medicine contact a poison control center or emergency room at once. ?NOTE: This medicine is only for you. Do not share this medicine with others. ?What if I miss a dose? ?If you miss a dose, take it as soon as you can. If it is almost time for your next dose, take only that  dose. Do not take double or extra doses. ?What may interact with this medication? ?Interactions are not expected. ?This list may not describe all possible interactions. Give your health care provider a list of all the medicines, herbs, non-prescription drugs, or dietary supplements you use. Also tell them if you smoke, drink alcohol, or use illegal drugs. Some items may interact with your medicine. ?What should I watch for while using this medication? ?Do not use for more than one week without advice from your care team. If your constipation returns, check with your care team. ?Drink plenty of water while taking this medication. Drinking water helps decrease constipation. ?Stop using this medication and contact your care team if you experience any rectal bleeding or do not have a bowel movement after use. These could be signs of a more serious condition. ?What side effects may I notice from receiving this medication? ?Side effects that you should report to your care team as soon as possible: ?Allergic reactions--skin rash, itching, hives, swelling of the face, lips, tongue, or throat ?Side effects that usually do not require medical attention (report to your care team if they continue or are bothersome): ?Bloating ?Gas ?Nausea ?Stomach cramping ?This list may not describe all possible side effects. Call your doctor for medical advice about side effects. You may report side effects to FDA at 1-800-FDA-1088. ?Where should I keep my medication? ?Keep out of the reach of children and pets. ?Store at room temperature between 20 and 25 degrees C (68 and 77 degrees F). Get rid of any unused medication after  the expiration date. ?To get rid of medications that are no longer needed or have expired: ?Take the medication to a medication take-back program. Check with your pharmacy or law enforcement to find a location. ?If you cannot return the medication, check the label or package insert to see if the medication should be  thrown out in the garbage or flushed down the toilet. If you are not sure, ask your care team. If it is safe to put it in the trash, pour the medication out of the container. Mix the medication with cat litter, dirt, coffee grounds, or other unwanted substance. Seal the mixture in a bag or container. Put it in the trash. ?NOTE: This sheet is a summary. It may not cover all possible information. If you have questions about this medicine, talk to your doctor, pharmacist, or health care provider. ?? 2022 Elsevier/Gold Standard (2021-01-02 00:00:00) ? ?

## 2021-11-15 ENCOUNTER — Other Ambulatory Visit: Payer: Self-pay

## 2021-11-15 ENCOUNTER — Ambulatory Visit (INDEPENDENT_AMBULATORY_CARE_PROVIDER_SITE_OTHER): Payer: Medicare HMO | Admitting: Internal Medicine

## 2021-11-15 ENCOUNTER — Encounter: Payer: Self-pay | Admitting: Internal Medicine

## 2021-11-15 VITALS — BP 128/72 | HR 80 | Temp 98.3°F | Resp 14 | Ht 70.0 in | Wt 171.5 lb

## 2021-11-15 DIAGNOSIS — M7661 Achilles tendinitis, right leg: Secondary | ICD-10-CM

## 2021-11-15 DIAGNOSIS — M7731 Calcaneal spur, right foot: Secondary | ICD-10-CM

## 2021-11-15 DIAGNOSIS — M722 Plantar fascial fibromatosis: Secondary | ICD-10-CM

## 2021-11-15 MED ORDER — NAPROXEN 500 MG PO TABS: 500.0000 mg | ORAL_TABLET | Freq: Two times a day (BID) | ORAL | 0 refills | Status: DC

## 2021-11-15 NOTE — Patient Instructions (Addendum)
It was great seeing you today!  Plan discussed at today's visit: -Treat plantar fascitis with anti-inflammatory twice a day for 10 days, do NOT take any other medicine like Advil, Aleve, Ibuprofen, etc and take the medication with food. Can take Tylenol  -Continue Allopurinol 200 mg daily   Follow up in: scheduled in July   Take care and let us know if you have any questions or concerns prior to your next visit.  Dr. Rosana Berger   Plantar Fasciitis Plantar fasciitis is a painful foot condition that affects the heel. It occurs when the band of tissue that connects the toes to the heel bone (plantar fascia) becomes irritated. This can happen as the result of exercising too much or doing other repetitive activities (overuse injury). Plantar fasciitis can cause mild irritation to severe pain that makes it difficult to walk or move. The pain is usually worse in the morning after sleeping, or after sitting or lying down for a period of time. Pain may also be worse after long periods of walking or standing. What are the causes? This condition may be caused by: Standing for long periods of time. Wearing shoes that do not have good arch support. Doing activities that put stress on joints (high-impact activities). This includes ballet and exercise that makes your heart beat faster (aerobic exercise), such as running. Being overweight. An abnormal way of walking (gait). Tight muscles in the back of your lower leg (calf). High arches in your feet or flat feet. Starting a new athletic activity. What are the signs or symptoms? The main symptom of this condition is heel pain. Pain may get worse after the following: Taking the first steps after a time of rest, especially in the morning after awakening, or after you have been sitting or lying down for a while. Long periods of standing still. Pain may decrease after 30-45 minutes of activity, such as gentle walking. How is this diagnosed? This condition  may be diagnosed based on your medical history, a physical exam, and your symptoms. Your health care provider will check for: A tender area on the bottom of your foot. A high arch in your foot or flat feet. Pain when you move your foot. Difficulty moving your foot. You may have imaging tests to confirm the diagnosis, such as: X-rays. Ultrasound. MRI. How is this treated? Treatment for plantar fasciitis depends on how severe your condition is. Treatment may include: Rest, ice, pressure (compression), and raising (elevating) the affected foot. This is called RICE therapy. Your health care provider may recommend RICE therapy along with over-the-counter pain medicines to manage your pain. Exercises to stretch your calves and your plantar fascia. A splint that holds your foot in a stretched, upward position while you sleep (night splint). Physical therapy to relieve symptoms and prevent problems in the future. Injections of steroid medicine (cortisone) to relieve pain and inflammation. Stimulating your plantar fascia with electrical impulses (extracorporeal shock wave therapy). This is usually the last treatment option before surgery. Surgery, if other treatments have not worked after 12 months. Follow these instructions at home: Managing pain, stiffness, and swelling  If directed, put ice on the painful area. To do this: Put ice in a plastic bag, or use a frozen bottle of water. Place a towel between your skin and the bag or bottle. Roll the bottom of your foot over the bag or bottle. Do this for 20 minutes, 2-3 times a day. Wear athletic shoes that have air-sole or gel-sole cushions, or  try soft shoe inserts that are designed for plantar fasciitis. Elevate your foot above the level of your heart while you are sitting or lying down. Activity Avoid activities that cause pain. Ask your health care provider what activities are safe for you. Do physical therapy exercises and stretches as told  by your health care provider. Try activities and forms of exercise that are easier on your joints (low impact). Examples include swimming, water aerobics, and biking. General instructions Take over-the-counter and prescription medicines only as told by your health care provider. Wear a night splint while sleeping, if told by your health care provider. Loosen the splint if your toes tingle, become numb, or turn cold and blue. Maintain a healthy weight, or work with your health care provider to lose weight as needed. Keep all follow-up visits. This is important. Contact a health care provider if you have: Symptoms that do not go away with home treatment. Pain that gets worse. Pain that affects your ability to move or do daily activities. Summary Plantar fasciitis is a painful foot condition that affects the heel. It occurs when the band of tissue that connects the toes to the heel bone (plantar fascia) becomes irritated. Heel pain is the main symptom of this condition. It may get worse after exercising too much or standing still for a long time. Treatment varies, but it usually starts with rest, ice, pressure (compression), and raising (elevating) the affected foot. This is called RICE therapy. Over-the-counter medicines can also be used to manage pain. This information is not intended to replace advice given to you by your health care provider. Make sure you discuss any questions you have with your health care provider. Document Revised: 12/16/2019 Document Reviewed: 12/16/2019 Elsevier Patient Education  2022 [place].  Plantar Fasciitis Rehab Ask your health care provider which exercises are safe for you. Do exercises exactly as told by your health care provider and adjust them as directed. It is normal to feel mild stretching, pulling, tightness, or discomfort as you do these exercises. Stop right away if you feel sudden pain or your pain gets worse. Do not begin these exercises until told  by your health care provider. Stretching and range-of-motion exercises These exercises warm up your muscles and joints and improve the movement and flexibility of your foot. These exercises also help to relieve pain. Plantar fascia stretch Sit with your left / right leg crossed over your opposite knee. Hold your heel with one hand with that thumb near your arch. With your other hand, hold your toes and gently pull them back toward the top of your foot. You should feel a stretch on the base (bottom) of your toes, or the bottom of your foot (plantar fascia), or both. Hold this stretch for__________ seconds. Slowly release your toes and return to the starting position. Repeat __________ times. Complete this exercise __________ times a day. Gastrocnemius stretch, standing This exercise is also called a calf (gastroc) stretch. It stretches the muscles in the back of the upper calf. Stand with your hands against a wall. Extend your left / right leg behind you, and bend your front knee slightly. Keeping your heels on the floor, your toes facing forward, and your back knee straight, shift your weight toward the wall. Do not arch your back. You should feel a gentle stretch in your upper calf. Hold this position for __________ seconds. Repeat __________ times. Complete this exercise __________ times a day. Soleus stretch, standing This exercise is also called a  calf (soleus) stretch. It stretches the muscles in the back of the lower calf. Stand with your hands against a wall. Extend your left / right leg behind you, and bend your front knee slightly. Keeping your heels on the floor and your toes facing forward, bend your back knee and shift your weight slightly over your back leg. You should feel a gentle stretch deep in your lower calf. Hold this position for __________ seconds. Repeat __________ times. Complete this exercise __________ times a day. Gastroc and soleus stretch, standing step This  exercise stretches the muscles in the back of the lower leg. These muscles are in the upper calf (gastrocnemius) and the lower calf (soleus). Stand with the ball of your left / right foot on the front of a step. The ball of your foot is on the walking surface, right under your toes. Keep your other foot firmly on the same step. Hold on to the wall or a railing for balance. Slowly lift your other foot, allowing your body weight to press your heel down over the edge of the front of the step. Keep knee straight and unbent. You should feel a stretch in your calf. Hold this position for __________ seconds. Return both feet to the step. Repeat this exercise with a slight bend in your left / right knee. Repeat __________ times with your left / right knee straight and __________ times with your left / right knee bent. Complete this exercise __________ times a day. Balance exercise This exercise builds your balance and strength control of your arch to help take pressure off your plantar fascia. Single leg stand If this exercise is too easy, you can try it with your eyes closed or while standing on a pillow. Without shoes, stand near a railing or in a doorway. You may hold on to the railing or door frame as needed. Stand on your left / right foot. Keep your big toe down on the floor and lift the arch of your foot. You should feel a stretch across the bottom of your foot and your arch. Do not let your foot roll inward. Hold this position for __________ seconds. Repeat __________ times. Complete this exercise __________ times a day. This information is not intended to replace advice given to you by your health care provider. Make sure you discuss any questions you have with your health care provider. Document Revised: 06/11/2020 Document Reviewed: 06/11/2020 Elsevier Patient Education  [place].

## 2021-11-15 NOTE — Progress Notes (Signed)
Acute Office Visit  Subjective:    Patient ID: Tracy Agosto., male    DOB: 02-25-1930, 88 y.o.   MRN: [id number]  Chief Complaint  Patient presents with   Foot Pain    In right heal/gout    HPI Patient is in today for pain in right heel. He has history of gout, had been taking Allopurinol 200 mg daily but had a change in his medication recently and is confused about what to be taking.  Pain is located in the right heel, being going on for 1 month, constant. Pain is worse in the morning, located at the achilles tendon insertion point, taking tylenol that is not really helping. Denies color change, swelling, warmth in the joint, fevers. X-ray of the right ankle/foot from 11/22 showing:  1. Soft tissue edema overlying the plantar aspect of the calcaneus, more prominent involving the medial aspect. No soft tissue calcifications, erosion or bony destructive change. 2. Minor osteoarthritis of the digits. 3. Tiny plantar calcaneal spur and Achilles tendon enthesophyte.   Past Medical History:  Diagnosis Date   Arthritis    Cancer ([place])    SKIN CANCER melanoma 2 sites and multiple squamous cell   Diabetes mellitus without complication (HCC)    GERD (gastroesophageal reflux disease)    Glaucoma    Hyperlipidemia    Hypertension    Myocardial infarction ([place])    08/27/1979   Vertigo     Past Surgical History:  Procedure Laterality Date   CATARACT EXTRACTION W/ INTRAOCULAR LENS  IMPLANT, BILATERAL     CIRCUMCISION     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/10/2019   GANGLION CYST EXCISION     LEFT HAND   LUMBAR LAMINECTOMY/DECOMPRESSION MICRODISCECTOMY  09/13/2017   Procedure: BILATERAL Lumbar three-four , Left  Lumbar four-five  AND Left  L5-S1, LAMINECTOMY/FORAMINOTOMY;  Surgeon: [name], MD;  Location: [facility];  Service: Neurosurgery;;   MELANOMA EXCISION  07/2011   TONSILLECTOMY     VASECTOMY      Family History  Problem Relation Age of Onset   Heart disease Mother     Diabetes Mother        type 2   Angina Mother    Heart disease Brother    Pneumonia Father    Heart attack Father    Alcoholism Daughter    Cirrhosis Daughter     Social History   Socioeconomic History   Marital status: Married    Spouse name: [name]   Number of children: 4   Years of education: Not on file   Highest education level: Bachelor's degree (e.g., BA, AB, BS)  Occupational History   Occupation: Retired  Tobacco Use   Smoking status: Former    Packs/day: 2.00    Years: 13.00    Pack years: 26.00    Types: Cigarettes    Quit date: 1963    Years since quitting: 60.2   Smokeless tobacco: Never   Tobacco comments:    smoking cessation materials not required  Vaping Use   Vaping Use: Never used  Substance and Sexual Activity   Alcohol use: Yes    Alcohol/week: 2.0 standard drinks    Types: 2 Shots of liquor per week    Comment: occasional   Drug use: No   Sexual activity: Not Currently  Other Topics Concern   Not on file  Social History Narrative   Daughter Manuela Schwartz passed away Oct 25, 2018.   Social Determinants of Radio broadcast assistant  Strain: Low Risk    Difficulty of Paying Living Expenses: Not hard at all  Food Insecurity: No Food Insecurity   Worried About Charity fundraiser in the Last Year: Never true   Ran Out of Food in the Last Year: Never true  Transportation Needs: No Transportation Needs   Lack of Transportation (Medical): No   Lack of Transportation (Non-Medical): No  Physical Activity: Sufficiently Active   Days of Exercise per Week: 7 days   Minutes of Exercise per Session: 30 min  Stress: No Stress Concern Present   Feeling of Stress : Not at all  Social Connections: Moderately Integrated   Frequency of Communication with Friends and Family: More than three times a week   Frequency of Social Gatherings with Friends and Family: Three times a week   Attends Religious Services: Never   Active Member of Clubs or Organizations: Yes    Attends Archivist Meetings: 1 to 4 times per year   Marital Status: Married  Human resources officer Violence: Not At Risk   Fear of Current or Ex-Partner: No   Emotionally Abused: No   Physically Abused: No   Sexually Abused: No    Outpatient Medications Prior to Visit  Medication Sig Dispense Refill   Accu-Chek FastClix Lancets MISC CHECK  BLOOD  SUGAR TWICE DAILY 204 each 1   allopurinol (ZYLOPRIM) 100 MG tablet Take 100 mg by mouth daily.     amLODipine-benazepril (LOTREL) 10-40 MG capsule TAKE 1 CAPSULE BY MOUTH DAILY. (IN PLACE OF AMLODIPINE 5MG AND LISINOPRIL 40MG) 90 capsule 1   aspirin 81 MG tablet Take 81 mg by mouth.     atorvastatin (LIPITOR) 10 MG tablet TAKE 1 TABLET EVERY DAY 90 tablet 1   Blood Glucose Calibration (ACCU-CHEK AVIVA) SOLN 1 Bottle by In Vitro route as needed. 1 each 2   Blood Glucose Monitoring Suppl (ACCU-CHEK NANO SMARTVIEW) W/DEVICE KIT USE AS DIRECTED 1 kit 0   Cholecalciferol (VITAMIN D) 2000 units tablet Take 2,000 Units by mouth daily.     colchicine 0.6 MG tablet Take 1 tablet (0.6 mg total) by mouth daily. Take 2 tablets (1.2 mg) at onset of symptoms, followed by 1 tablet (0.6 mg) one hour later if symptoms do not resolve. Do not take more than 1.8 mg in 24 hours. 30 tablet 1   dorzolamide-timolol (COSOPT) 22.3-6.8 MG/ML ophthalmic solution Place 1 drop into the right eye 2 (two) times daily.     doxazosin (CARDURA) 4 MG tablet Take 1 tablet (4 mg total) by mouth at bedtime. 90 tablet 3   finasteride (PROSCAR) 5 MG tablet Take 1 tablet (5 mg total) by mouth at bedtime. 90 tablet 3   glucose blood (COOL BLOOD GLUCOSE TEST STRIPS) test strip Use as instructed 100 each 12   insulin glargine (LANTUS SOLOSTAR) 100 UNIT/ML Solostar Pen Inject 46 Units into the skin daily. 30 mL 0   Insulin Pen Needle 31G X 5 MM MISC 1 Device by Does not apply route as needed. 120 each 2   JARDIANCE 10 MG TABS tablet Take 1 tablet (10 mg total) by mouth daily. 90 tablet  1   Multiple Vitamins-Minerals (CENTRUM SILVER 50+MEN) TABS Take 1 tablet by mouth daily.     omega-3 fish oil (MAXEPA) 1000 MG CAPS capsule Take 2 capsules by mouth daily.      omeprazole (PRILOSEC) 40 MG capsule Take 1 capsule (40 mg total) by mouth daily. 90 capsule 1   vitamin B-12 (  CYANOCOBALAMIN) 500 MCG tablet Take 1 tablet by mouth daily.     No facility-administered medications prior to visit.    Allergies  Allergen Reactions   Tetracyclines & Related Other (See Comments)    UNSPECIFIED REACTION  other    Review of Systems  Constitutional:  Negative for chills and fever.  Musculoskeletal:  Positive for arthralgias. Negative for joint swelling.  Skin:  Negative for color change.  Neurological:  Negative for weakness and numbness.      Objective:    Physical Exam Constitutional:      Appearance: Normal appearance.  HENT:     Head: Normocephalic and atraumatic.  Eyes:     Conjunctiva/sclera: Conjunctivae normal.  Cardiovascular:     Rate and Rhythm: Normal rate and regular rhythm.  Pulmonary:     Effort: Pulmonary effort is normal.     Breath sounds: Normal breath sounds.  Musculoskeletal:        General: Tenderness present. No swelling.     Comments: Point tenderness over the right achilles insertion point   Skin:    General: Skin is warm and dry.     Findings: No erythema.  Neurological:     General: No focal deficit present.     Mental Status: He is alert. Mental status is at baseline.    There were no vitals taken for this visit. Wt Readings from Last 3 Encounters:  11/10/21 171 lb 12.8 oz (77.9 kg)  09/22/21 171 lb (77.6 kg)  08/09/21 171 lb 6.4 oz (77.7 kg)    Health Maintenance Due  Topic Date Due   Zoster Vaccines- Shingrix (1 of 2) Never done   COVID-19 Vaccine (4 - Booster for Pfizer series) 08/03/2020    There are no preventive care reminders to display for this patient.   Lab Results  Component Value Date   TSH 2.90 11/28/2016    Lab Results  Component Value Date   WBC 7.1 06/14/2021   HGB 13.9 06/14/2021   HCT 42.3 06/14/2021   MCV 98.4 06/14/2021   PLT 163 06/14/2021   Lab Results  Component Value Date   NA 140 06/14/2021   K 4.5 06/14/2021   CO2 27 06/14/2021   GLUCOSE 176 (H) 06/14/2021   BUN 26 (H) 06/14/2021   CREATININE 1.25 (H) 06/14/2021   BILITOT 0.5 06/14/2021   ALKPHOS 45 08/30/2018   AST 16 06/14/2021   ALT 20 06/14/2021   PROT 6.6 06/14/2021   ALBUMIN 3.6 08/30/2018   CALCIUM 9.1 06/14/2021   ANIONGAP 8 08/30/2018   EGFR 55 (L) 06/14/2021   Lab Results  Component Value Date   CHOL 113 06/14/2021   Lab Results  Component Value Date   HDL 46 06/14/2021   Lab Results  Component Value Date   LDLCALC 36 06/14/2021   Lab Results  Component Value Date   TRIG 266 (H) 06/14/2021   Lab Results  Component Value Date   CHOLHDL 2.5 06/14/2021   Lab Results  Component Value Date   HGBA1C 6.6 (A) 09/22/2021       Assessment & Plan:   1. Achilles tendinitis of right lower extremity/Plantar fasciitis of right foot/Calcaneal spur of foot, right: Discussed stretching and conservative treatment for plantar fascitis, treat with anti-inflammatories scheduled for ten days and then as needed. Referral placed to podiatry for calcaneal spur seen on x-ray, as this appears to be the location of his pain.   - naproxen (NAPROSYN) 500 MG tablet; Take 1 tablet (500  mg total) by mouth 2 (two) times daily with a meal.  Dispense: 30 tablet; Refill: 0 - Ambulatory referral to [facility], DO

## 2021-11-17 ENCOUNTER — Ambulatory Visit
Admission: RE | Admit: 2021-11-17 | Discharge: 2021-11-17 | Disposition: A | Discharge status: Home / Self Care | Payer: Medicare HMO | Attending: Gastroenterology | Admitting: Gastroenterology

## 2021-11-17 ENCOUNTER — Encounter
Admission: RE | Disposition: A | Discharge status: Home / Self Care | Payer: Self-pay | Source: Home / Self Care | Attending: Gastroenterology

## 2021-11-17 ENCOUNTER — Ambulatory Visit: Payer: Medicare HMO | Admitting: Certified Registered"

## 2021-11-17 ENCOUNTER — Encounter: Payer: Self-pay | Admitting: Gastroenterology

## 2021-11-17 ENCOUNTER — Other Ambulatory Visit: Payer: Self-pay | Admitting: Family Medicine

## 2021-11-17 DIAGNOSIS — H409 Unspecified glaucoma: Secondary | ICD-10-CM | POA: Diagnosis not present

## 2021-11-17 DIAGNOSIS — K635 Polyp of colon: Secondary | ICD-10-CM

## 2021-11-17 DIAGNOSIS — E119 Type 2 diabetes mellitus without complications: Secondary | ICD-10-CM | POA: Diagnosis not present

## 2021-11-17 DIAGNOSIS — Z87891 Personal history of nicotine dependence: Secondary | ICD-10-CM | POA: Insufficient documentation

## 2021-11-17 DIAGNOSIS — Z85820 Personal history of malignant melanoma of skin: Secondary | ICD-10-CM | POA: Insufficient documentation

## 2021-11-17 DIAGNOSIS — E785 Hyperlipidemia, unspecified: Secondary | ICD-10-CM | POA: Diagnosis not present

## 2021-11-17 DIAGNOSIS — K5730 Diverticulosis of large intestine without perforation or abscess without bleeding: Secondary | ICD-10-CM | POA: Diagnosis not present

## 2021-11-17 DIAGNOSIS — I2510 Atherosclerotic heart disease of native coronary artery without angina pectoris: Secondary | ICD-10-CM | POA: Insufficient documentation

## 2021-11-17 DIAGNOSIS — D122 Benign neoplasm of ascending colon: Secondary | ICD-10-CM | POA: Diagnosis not present

## 2021-11-17 DIAGNOSIS — K219 Gastro-esophageal reflux disease without esophagitis: Secondary | ICD-10-CM | POA: Insufficient documentation

## 2021-11-17 DIAGNOSIS — R194 Change in bowel habit: Secondary | ICD-10-CM

## 2021-11-17 DIAGNOSIS — I10 Essential (primary) hypertension: Secondary | ICD-10-CM | POA: Diagnosis not present

## 2021-11-17 DIAGNOSIS — I252 Old myocardial infarction: Secondary | ICD-10-CM | POA: Diagnosis not present

## 2021-11-17 HISTORY — PX: COLONOSCOPY WITH PROPOFOL: SHX5780

## 2021-11-17 LAB — GLUCOSE, CAPILLARY: Glucose-Capillary: 95 mg/dL (ref 70–99)

## 2021-11-17 SURGERY — COLONOSCOPY WITH PROPOFOL
Anesthesia: General

## 2021-11-17 MED ORDER — PROPOFOL 10 MG/ML IV BOLUS
INTRAVENOUS | Status: DC | PRN
  Administered 2021-11-17: 50 mg via INTRAVENOUS
  Administered 2021-11-17 (×2): 20 mg via INTRAVENOUS

## 2021-11-17 MED ORDER — PROPOFOL 500 MG/50ML IV EMUL
INTRAVENOUS | Status: DC | PRN
  Administered 2021-11-17: 100 ug/kg/min via INTRAVENOUS

## 2021-11-17 MED ORDER — SODIUM CHLORIDE (PF) 0.9 % IJ SOLN
INTRAMUSCULAR | Status: DC | PRN
  Administered 2021-11-17: 2 mL via INTRAVENOUS

## 2021-11-17 MED ORDER — SODIUM CHLORIDE 0.9 % IV SOLN: INTRAVENOUS | Status: DC

## 2021-11-17 MED ORDER — LIDOCAINE 2% (20 MG/ML) 5 ML SYRINGE
INTRAMUSCULAR | Status: DC | PRN
  Administered 2021-11-17: 20 mg via INTRAVENOUS

## 2021-11-17 NOTE — Anesthesia Preprocedure Evaluation (Signed)
Anesthesia Evaluation  ?Patient identified by MRN, date of birth, ID band ?Patient awake ? ? ? ?Reviewed: ?Allergy & Precautions, NPO status , Patient's Chart, lab work & pertinent test results ? ?History of Anesthesia Complications ?Negative for: history of anesthetic complications ? ?Airway ?Mallampati: III ? ?TM Distance: <3 FB ?Neck ROM: full ? ? ? Dental ? ?(+) Chipped, Poor Dentition, Missing ?  ?Pulmonary ?neg pulmonary ROS, neg shortness of breath, former smoker,  ?  ?Pulmonary exam normal ? ? ? ? ? ? ? Cardiovascular ?Exercise Tolerance: Good ?hypertension, (-) angina+ CAD and + Past MI  ?Normal cardiovascular exam ? ? ?  ?Neuro/Psych ?negative neurological ROS ? negative psych ROS  ? GI/Hepatic ?Neg liver ROS, GERD  Controlled,  ?Endo/Other  ?diabetes, Type 2 ? Renal/GU ?Renal disease  ?negative genitourinary ?  ?Musculoskeletal ? ?(+) Arthritis ,  ? Abdominal ?  ?Peds ? Hematology ?negative hematology ROS ?(+)   ?Anesthesia Other Findings ?Past Medical History: ?No date: Arthritis ?No date: Cancer [facility]) ?    Comment:  SKIN CANCER melanoma 2 sites and multiple squamous cell ?No date: Diabetes mellitus without complication ([place]) ?No date: GERD (gastroesophageal reflux disease) ?No date: Glaucoma ?No date: Hyperlipidemia ?No date: Hypertension ?No date: Myocardial infarction [facility]) ?    Comment:  08/27/1979 ?No date: Vertigo ? ?Past Surgical History: ?No date: CATARACT EXTRACTION W/ INTRAOCULAR LENS  IMPLANT, BILATERAL ?No date: CIRCUMCISION ?06/10/2019: CORNEA LACERATION REPAIR ?No date: GANGLION CYST EXCISION ?    Comment:  LEFT HAND ?09/13/2017: LUMBAR LAMINECTOMY/DECOMPRESSION MICRODISCECTOMY ?    Comment:  Procedure: BILATERAL Lumbar three-four , Left  Lumbar  ?             four-five  AND Left  L5-S1, LAMINECTOMY/FORAMINOTOMY;   ?             Surgeon: [name], MD;  Location: [place];  Service:  ?             Neurosurgery;; ?07/2011: MELANOMA EXCISION ?No date:  TONSILLECTOMY ?No date: VASECTOMY ? ?BMI   ? Body Mass Index: 24.39 kg/m?  ?  ? ? Reproductive/Obstetrics ?negative OB ROS ? ?  ? ? ? ? ? ? ? ? ? ? ? ? ? ?  ?  ? ? ? ? ? ? ? ? ?Anesthesia Physical ?Anesthesia Plan ? ?ASA: 3 ? ?Anesthesia Plan: General  ? ?Post-op Pain Management:   ? ?Induction: Intravenous ? ?PONV Risk Score and Plan: Propofol infusion and TIVA ? ?Airway Management Planned: Natural Airway and Nasal Cannula ? ?Additional Equipment:  ? ?Intra-op Plan:  ? ?Post-operative Plan:  ? ?Informed Consent: I have reviewed the patients History and Physical, chart, labs and discussed the procedure including the risks, benefits and alternatives for the proposed anesthesia with the patient or authorized representative who has indicated his/her understanding and acceptance.  ? ? ? ?Dental Advisory Given ? ?Plan Discussed with: Anesthesiologist, CRNA and Surgeon ? ?Anesthesia Plan Comments: (Patient consented for risks of anesthesia including but not limited to:  ?- adverse reactions to medications ?- risk of airway placement if required ?- damage to eyes, teeth, lips or other oral mucosa ?- nerve damage due to positioning  ?- sore throat or hoarseness ?- Damage to heart, brain, nerves, lungs, other parts of body or loss of life ? ?Patient voiced understanding.)  ? ? ? ? ? ? ?Anesthesia Quick Evaluation ? ?

## 2021-11-17 NOTE — Op Note (Signed)
[facility] ?Gastroenterology ?[name] ?Procedure Date: 11/17/2021 9:39 AM ?MRN: [id number] ?Account #: [id number] ?Date of Birth: 1929-12-27 ?Admit Type: Outpatient ?Age: 88 ?Room: [facility] ENDO ROOM 3 ?Gender: Male ?Note Status: Finalized ?Instrument Name: Colonscope [id number] ?Procedure:             Colonoscopy ?Indications:           Change in bowel habits ?Providers:             Jonathon Bellows MD, MD ?Referring MD:          Bethena Roys. Ancil Boozer, MD (Referring MD) ?Medicines:             Monitored Anesthesia Care ?Complications:         No immediate complications. ?Procedure:             Pre-Anesthesia Assessment: ?                       - Prior to the procedure, a History and Physical was  ?                       performed, and patient medications, allergies and  ?                       sensitivities were reviewed. The patient's tolerance  ?                       of previous anesthesia was reviewed. ?                       - The risks and benefits of the procedure and the  ?                       sedation options and risks were discussed with the  ?                       patient. All questions were answered and informed  ?                       consent was obtained. ?                       - ASA Grade Assessment: II - A patient with mild  ?                       systemic disease. ?                       After obtaining informed consent, the colonoscope was  ?                       passed under direct vision. Throughout the procedure,  ?                       the patient's blood pressure, pulse, and oxygen  ?                       saturations were monitored continuously. The  ?                       Colonoscope was introduced  through the anus and  ?                       advanced to the the cecum, identified by the  ?                       appendiceal orifice. The colonoscopy was performed  ?                       with ease. The patient tolerated the procedure well.  ?                        The quality of the bowel preparation was excellent. ?Findings: ?     The perianal and digital rectal examinations were normal. ?     Multiple small and large-mouthed diverticula were found in the entire  ?     colon. ?     A 15 mm polyp was found in the proximal ascending colon. The polyp was  ?     sessile. Preparations were made for mucosal resection. Saline was  ?     injected to raise the lesion. Snare mucosal resection was performed.  ?     Resection was complete, and retrieval was complete. To prevent bleeding  ?     after mucosal resection, one hemostatic clip was successfully placed.  ?     There was no bleeding during, or at the end, of the procedure. Margins  ?     of polyp marked using blue or NBI light ?     The exam was otherwise without abnormality on direct and retroflexion  ?     views. ?Impression:            - Diverticulosis in the entire examined colon. ?                       - One 15 mm polyp in the proximal ascending colon,  ?                       removed with mucosal resection. Resected and  ?                       retrieved. Clip was placed. ?                       - The examination was otherwise normal on direct and  ?                       retroflexion views. ?                       - Mucosal resection was performed. Resection was  ?                       complete, and retrieval was complete. ?Recommendation:        - Discharge patient to home (with escort). ?                       - Resume previous diet. ?                       - Continue present medications. ?                       -  Await pathology results. ?                       - Repeat colonoscopy is not recommended due to current  ?                       age (54 years or older) for surveillance. ?                       - Return to GI office as previously scheduled. ?Procedure Code(s):     --- Professional --- ?                       [phone], Colonoscopy, flexible; with endoscopic mucosal  ?                        resection ?Diagnosis Code(s):     --- Professional --- ?                       K63.5, Polyp of colon ?                       R19.4, Change in bowel habit ?                       K57.30, Diverticulosis of large intestine without  ?                       perforation or abscess without bleeding ?CPT copyright 2019 American Medical Association. All rights reserved. ?The codes documented in this report are preliminary and upon coder review may  ?be revised to meet current compliance requirements. ?Jonathon Bellows, MD ?Jonathon Bellows MD, MD ?11/17/2021 10:14:44 AM ?This report has been signed electronically. ?Number of Addenda: 0 ?Note Initiated On: 11/17/2021 9:39 AM ?Scope Withdrawal Time: 0 hours 13 minutes 57 seconds  ?Total Procedure Duration: 0 hours 15 minutes 42 seconds  ?Estimated Blood Loss:  Estimated blood loss: none. ?     [facility] ?

## 2021-11-17 NOTE — H&P (Signed)
? ? ? ?Tracy Bellows, MD ?[address] [place], [place], [place], [address] ?[address], [place], [place], [place], [address] ?Phone: [phone]  ?Fax: [phone] ? ?Primary Care Physician:  Steele Sizer, MD ? ? ?Pre-Procedure History & Physical: ?HPI:  [name]. is a 88 y.o. male is here for an colonoscopy. ?  ?Past Medical History:  ?Diagnosis Date  ? Arthritis   ? Cancer [facility])   ? SKIN CANCER melanoma 2 sites and multiple squamous cell  ? Diabetes mellitus without complication ([place])   ? GERD (gastroesophageal reflux disease)   ? Glaucoma   ? Hyperlipidemia   ? Hypertension   ? Myocardial infarction [facility])   ? 08/27/1979  ? Vertigo   ? ? ?Past Surgical History:  ?Procedure Laterality Date  ? CATARACT EXTRACTION W/ INTRAOCULAR LENS  IMPLANT, BILATERAL    ? CIRCUMCISION    ? 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/10/2019  ? GANGLION CYST EXCISION    ? LEFT HAND  ? LUMBAR LAMINECTOMY/DECOMPRESSION MICRODISCECTOMY  09/13/2017  ? Procedure: BILATERAL Lumbar three-four , Left  Lumbar four-five  AND Left  L5-S1, LAMINECTOMY/FORAMINOTOMY;  Surgeon: [name], MD;  Location: [place];  Service: Neurosurgery;;  ? MELANOMA EXCISION  07/2011  ? TONSILLECTOMY    ? VASECTOMY    ? ? ?Prior to Admission medications   ?Medication Sig Start Date End Date Taking? Authorizing Provider  ?allopurinol (ZYLOPRIM) 100 MG tablet Take 200 mg by mouth daily.   Yes [provider]  ?amLODipine-benazepril (LOTREL) 10-40 MG capsule TAKE 1 CAPSULE BY MOUTH DAILY. (IN PLACE OF AMLODIPINE 5MG AND LISINOPRIL 40MG) 09/02/21  Yes Sowles, Drue Stager, MD  ?aspirin 81 MG tablet Take 81 mg by mouth. 10/08/10  Yes [provider]  ?atorvastatin (LIPITOR) 10 MG tablet TAKE 1 TABLET EVERY DAY 10/08/21  Yes Sowles, Drue Stager, MD  ?Blood Glucose Monitoring Suppl (ACCU-CHEK NANO SMARTVIEW) W/DEVICE KIT USE AS DIRECTED 05/15/15  Yes Ashok Norris, MD  ?Cholecalciferol (VITAMIN D) 2000 units tablet Take 2,000 Units by mouth daily. 02/02/17   Yes [provider]  ?doxazosin (CARDURA) 4 MG tablet Take 1 tablet (4 mg total) by mouth at bedtime. 10/29/21  Yes Sowles, Drue Stager, MD  ?finasteride (PROSCAR) 5 MG tablet Take 1 tablet (5 mg total) by mouth at bedtime. 10/29/21  Yes Sowles, Drue Stager, MD  ?JARDIANCE 10 MG TABS tablet Take 1 tablet (10 mg total) by mouth daily. 09/22/21  Yes Sowles, Drue Stager, MD  ?Multiple Vitamins-Minerals (CENTRUM SILVER 50+MEN) TABS Take 1 tablet by mouth daily.   Yes [provider]  ?omeprazole (PRILOSEC) 40 MG capsule Take 1 capsule (40 mg total) by mouth daily. 03/01/21  Yes Sowles, Drue Stager, MD  ?vitamin B-12 (CYANOCOBALAMIN) 500 MCG tablet Take 1 tablet by mouth daily. 06/15/17  Yes [provider]  ?Accu-Chek FastClix Lancets [employer]  BLOOD  SUGAR TWICE DAILY 09/10/20   Steele Sizer, MD  ?Blood Glucose Calibration (ACCU-CHEK AVIVA) SOLN 1 Bottle by In Vitro route as needed. 07/31/20   Steele Sizer, MD  ?colchicine 0.6 MG tablet Take 1 tablet (0.6 mg total) by mouth daily. Take 2 tablets (1.2 mg) at onset of symptoms, followed by 1 tablet (0.6 mg) one hour later if symptoms do not resolve. Do not take more than 1.8 mg in 24 hours. ?Patient not taking: Reported on 11/15/2021 09/01/21   [name], DO  ?dorzolamide-timolol (COSOPT) 22.3-6.8 MG/ML ophthalmic solution Place 1 drop into the right eye 2 (two) times daily.  [provider]  ?glucose blood (COOL BLOOD GLUCOSE TEST STRIPS) test strip Use as instructed 03/03/15   Rochel Brome A, MD  ?insulin glargine (LANTUS SOLOSTAR) 100 UNIT/ML Solostar Pen Inject 46 Units into the skin daily. 02/12/20   Steele Sizer, MD  ?Insulin Pen Needle 31G X 5 MM MISC 1 Device by Does not apply route as needed. 11/06/20   Steele Sizer, MD  ?naproxen (NAPROSYN) 500 MG tablet Take 1 tablet (500 mg total) by mouth 2 (two) times daily with a meal. 11/15/21   [name], DO  ?omega-3 fish oil (MAXEPA) 1000 MG CAPS capsule Take 2 capsules by  mouth daily.     [provider]  ? ? ?Allergies as of 11/11/2021 - Review Complete 11/10/2021  ?Allergen Reaction Noted  ? Tetracyclines & related Other (See Comments) 01/14/2013  ? ? ?Family History  ?Problem Relation Age of Onset  ? Heart disease Mother   ? Diabetes Mother   ?     type 2  ? Angina Mother   ? Heart disease Brother   ? Pneumonia Father   ? Heart attack Father   ? Alcoholism Daughter   ? Cirrhosis Daughter   ? ? ?Social History  ? ?Socioeconomic History  ? Marital status: Married  ?  Spouse name: [name]  ? Number of children: 4  ? Years of education: Not on file  ? Highest education level: Bachelor's degree (e.g., BA, AB, BS)  ?Occupational History  ? Occupation: Retired  ?Tobacco Use  ? Smoking status: Former  ?  Packs/day: 2.00  ?  Years: 13.00  ?  Pack years: 26.00  ?  Types: Cigarettes  ?  Quit date: 1963  ?  Years since quitting: 60.2  ? Smokeless tobacco: Never  ? Tobacco comments:  ?  smoking cessation materials not required  ?Vaping Use  ? Vaping Use: Never used  ?Substance and Sexual Activity  ? Alcohol use: Yes  ?  Alcohol/week: 2.0 standard drinks  ?  Types: 2 Shots of liquor per week  ?  Comment: occasional  ? Drug use: No  ? Sexual activity: Not Currently  ?Other Topics Concern  ? Not on file  ?Social History Narrative  ? Daughter Manuela Schwartz passed away 2018/10/04.  ? ?Social Determinants of Health  ? ?Financial Resource Strain: Low Risk   ? Difficulty of Paying Living Expenses: Not hard at all  ?Food Insecurity: No Food Insecurity  ? Worried About Charity fundraiser in the Last Year: Never true  ? Ran Out of Food in the Last Year: Never true  ?Transportation Needs: No Transportation Needs  ? Lack of Transportation (Medical): No  ? Lack of Transportation (Non-Medical): No  ?Physical Activity: Sufficiently Active  ? Days of Exercise per Week: 7 days  ? Minutes of Exercise per Session: 30 min  ?Stress: No Stress Concern Present  ? Feeling of Stress : Not at all  ?Social Connections:  Moderately Integrated  ? Frequency of Communication with Friends and Family: More than three times a week  ? Frequency of Social Gatherings with Friends and Family: Three times a week  ? Attends Religious Services: Never  ? Active Member of Clubs or Organizations: Yes  ? Attends Archivist Meetings: 1 to 4 times per year  ? Marital Status: Married  ?Intimate Partner Violence: Not At Risk  ? Fear of Current or Ex-Partner: No  ? Emotionally Abused: No  ? Physically Abused: No  ? Sexually  Abused: No  ? ? ?Review of Systems: ?See HPI, otherwise negative ROS ? ?Physical Exam: ?BP 140/85   Pulse 73   Temp (!) 96.6 ?F (35.9 ?C) (Temporal)   Resp 17   Ht 5' 10"  (1.778 m)   Wt 77.1 kg   SpO2 98%   BMI 24.39 kg/m?  ?General:   Alert,  pleasant and cooperative in NAD ?Head:  Normocephalic and atraumatic. ?Neck:  Supple; no masses or thyromegaly. ?Lungs:  Clear throughout to auscultation, normal respiratory effort.    ?Heart:  +S1, +S2, Regular rate and rhythm, No edema. ?Abdomen:  Soft, nontender and nondistended. Normal bowel sounds, without guarding, and without rebound.   ?Neurologic:  Alert and  oriented x4;  grossly normal neurologically. ? ?Impression/Plan: ?[name]. is here for an colonoscopy to be performed for  change in bowel habits.  ?Risks, benefits, limitations, and alternatives regarding  colonoscopy have been reviewed with the patient.  Questions have been answered.  All parties agreeable. ? ? ?Tracy Bellows, MD  11/17/2021, 9:39 AM  ?

## 2021-11-17 NOTE — Transfer of Care (Signed)
Immediate Anesthesia Transfer of Care Note ? ?Patient: Tracy [name]. ? ?Procedure(s) Performed: COLONOSCOPY WITH PROPOFOL ? ?Patient Location: Endoscopy Unit ? ?Anesthesia Type:General ? ?Level of Consciousness: awake ? ?Airway & Oxygen Therapy: Patient Spontanous Breathing ? ?Post-op Assessment: Report given to RN and Post -op Vital signs reviewed and stable ? ?Post vital signs: Reviewed ? ?Last Vitals:  ?Vitals Value Taken Time  ?BP 122/62 11/17/21 1016  ?Temp    ?Pulse 53 11/17/21 1017  ?Resp 16 11/17/21 1017  ?SpO2 98 % 11/17/21 1017  ?Vitals shown include unvalidated device data. ? ?Last Pain:  ?Vitals:  ? 11/17/21 0929  ?TempSrc: Temporal  ?PainSc: 0-No pain  ?   ? ?  ? ?Complications: No notable events documented. ?

## 2021-11-17 NOTE — Anesthesia Postprocedure Evaluation (Signed)
Anesthesia Post Note ? ?Patient: Izell Labat. ? ?Procedure(s) Performed: COLONOSCOPY WITH PROPOFOL ? ?Patient location during evaluation: Endoscopy ?Anesthesia Type: General ?Level of consciousness: awake and alert ?Pain management: pain level controlled ?Vital Signs Assessment: post-procedure vital signs reviewed and stable ?Respiratory status: spontaneous breathing, nonlabored ventilation, respiratory function stable and patient connected to nasal cannula oxygen ?Cardiovascular status: blood pressure returned to baseline and stable ?Postop Assessment: no apparent nausea or vomiting ?Anesthetic complications: no ? ? ?No notable events documented. ? ? ?Last Vitals:  ?Vitals:  ? 11/17/21 1040 11/17/21 1041  ?BP:  (!) 150/60  ?Pulse: (!) 48 (!) 50  ?Resp: 16 16  ?Temp:    ?SpO2: 99% 100%  ?  ?Last Pain:  ?Vitals:  ? 11/17/21 0929  ?TempSrc: Temporal  ?PainSc: 0-No pain  ? ? ?  ?  ?  ?  ?  ?  ? ?Precious Haws Hajime Asfaw ? ? ? ? ?

## 2021-11-18 ENCOUNTER — Ambulatory Visit: Payer: Medicare HMO

## 2021-11-18 ENCOUNTER — Encounter: Payer: Self-pay | Admitting: Gastroenterology

## 2021-11-18 LAB — SURGICAL PATHOLOGY

## 2021-11-19 ENCOUNTER — Encounter: Payer: Self-pay | Admitting: Gastroenterology

## 2021-11-22 ENCOUNTER — Other Ambulatory Visit: Payer: Self-pay

## 2021-11-22 ENCOUNTER — Ambulatory Visit: Payer: Medicare HMO | Admitting: Podiatry

## 2021-11-22 ENCOUNTER — Encounter: Payer: Self-pay | Admitting: Podiatry

## 2021-11-22 DIAGNOSIS — M7661 Achilles tendinitis, right leg: Secondary | ICD-10-CM

## 2021-11-22 DIAGNOSIS — H6123 Impacted cerumen, bilateral: Secondary | ICD-10-CM | POA: Diagnosis not present

## 2021-11-22 DIAGNOSIS — H903 Sensorineural hearing loss, bilateral: Secondary | ICD-10-CM | POA: Diagnosis not present

## 2021-11-22 LAB — HM DIABETES FOOT EXAM: HM Diabetic Foot Exam: NORMAL

## 2021-11-22 MED ORDER — DEXAMETHASONE SODIUM PHOSPHATE 120 MG/30ML IJ SOLN
2.0000 mg | Freq: Once | INTRAMUSCULAR | Status: AC
  Administered 2021-11-22: 2 mg via INTRA_ARTICULAR

## 2021-11-22 NOTE — Progress Notes (Signed)
?Subjective:  ?Patient ID: Tracy Favors., male    DOB: [birth year],  MRN: [id number] ?HPI ?Chief Complaint  ?Patient presents with  ? Foot Pain  ?  Posterior/some plantar heel right - aching x 4-5 months, PCP xrayed-said heel spur, been treating for PF/AT - Rx'd febuxostat, then DC'd and started naproxen 554m BID (current taking), still very sore, active in golf  ? Diabetes  ?  Last a1c was 6.7  ? New Patient (Initial Visit)  ? ? ?88y.o. male presents with the above complaint.  ? ?ROS: Denies fever chills nausea vomit muscle aches pains calf pain back pain chest pain shortness of breath. ? ?Past Medical History:  ?Diagnosis Date  ? Arthritis   ? [facility]   ? SKIN CANCER melanoma 2 sites and multiple squamous cell  ? Diabetes mellitus without complication ([place]   ? GERD (gastroesophageal reflux disease)   ? Glaucoma   ? Hyperlipidemia   ? Hypertension   ? My[facility] infarction ([facility]   ? 08/27/1979  ? Vertigo   ? ?Past Surgical History:  ?Procedure Laterality Date  ? CATARACT EXTRACTION W/ INTRAOCULAR LENS  IMPLANT, BILATERAL    ? CIRCUMCISION    ? COLONOSCOPY WITH PROPOFOL N/A 11/17/2021  ? Procedure: COLONOSCOPY WITH PROPOFOL;  Surgeon: AJonathon Bellows MD;  [facility];  Service: Gastroenterology;  Laterality: N/A;  ? 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/10/2019  ? GANGLION CYST EXCISION    ? LEFT HAND  ? LUMBAR LAMINECTOMY/DECOMPRESSION MICRODISCECTOMY  09/13/2017  ? Procedure: BILATERAL Lumbar three-four , Left  Lumbar four-five  AND Left  L5-S1, LAMINECTOMY/FORAMINOTOMY;  Surgeon: CKary Kos MD;  [facility] [facility]  Service: Neurosurgery;;  ? MELANOMA EXCISION  07/2011  ? TONSILLECTOMY    ? VASECTOMY    ? ? ?Current Outpatient Medications:  ?  Accu-Chek FastClix Lancets MISC, CHECK  BLOOD  SUGAR TWICE DAILY, Disp: 204 each, Rfl: 1 ?  allopurinol (ZYLOPRIM) 100 MG tablet, Take 200 mg by mouth daily., Disp: , Rfl:  ?  amLODipine-benazepril (LOTREL) 10-40 MG capsule, TAKE 1 CAPSULE BY MOUTH DAILY. (IN  PLACE OF AMLODIPINE 5MG AND LISINOPRIL 40MG), Disp: 90 capsule, Rfl: 1 ?  aspirin 81 MG tablet, Take 81 mg by mouth., Disp: , Rfl:  ?  atorvastatin (LIPITOR) 10 MG tablet, TAKE 1 TABLET DAILY AT 6 PM., Disp: 90 tablet, Rfl: 1 ?  Blood Glucose Calibration (ACCU-CHEK AVIVA) SOLN, 1 Bottle by In Vitro route as needed., Disp: 1 each, Rfl: 2 ?  Blood Glucose Monitoring Suppl (ACCU-CHEK NANO SMARTVIEW) W/DEVICE KIT, USE AS DIRECTED, Disp: 1 kit, Rfl: 0 ?  Cholecalciferol (VITAMIN D) 2000 units tablet, Take 2,000 Units by mouth daily., Disp: , Rfl:  ?  dorzolamide-timolol (COSOPT) 22.3-6.8 MG/ML ophthalmic solution, Place 1 drop into the right eye 2 (two) times daily., Disp: , Rfl:  ?  doxazosin (CARDURA) 4 MG tablet, Take 1 tablet (4 mg total) by mouth at bedtime., Disp: 90 tablet, Rfl: 3 ?  finasteride (PROSCAR) 5 MG tablet, Take 1 tablet (5 mg total) by mouth at bedtime., Disp: 90 tablet, Rfl: 3 ?  glucose blood (COOL BLOOD GLUCOSE TEST STRIPS) test strip, Use as instructed, Disp: 100 each, Rfl: 12 ?  insulin glargine (LANTUS SOLOSTAR) 100 UNIT/ML Solostar Pen, Inject 46 Units into the skin daily., Disp: 30 mL, Rfl: 0 ?  Insulin Pen Needle 31G X 5 MM MISC, 1 Device by Does not apply route as needed., Disp: 120 each, Rfl: 2 ?  JARDIANCE  ?Subjective:  ?Patient ID: Tracy Favors., male    DOB: [birth year],  MRN: [id number] ?HPI ?Chief Complaint  ?Patient presents with  ? Foot Pain  ?  Posterior/some plantar heel right - aching x 4-5 months, PCP xrayed-said heel spur, been treating for PF/AT - Rx'd febuxostat, then DC'd and started naproxen 554m BID (current taking), still very sore, active in golf  ? Diabetes  ?  Last a1c was 6.7  ? New Patient (Initial Visit)  ? ? ?88y.o. male presents with the above complaint.  ? ?ROS: Denies fever chills nausea vomit muscle aches pains calf pain back pain chest pain shortness of breath. ? ?Past Medical History:  ?Diagnosis Date  ? Arthritis   ? [facility]   ? SKIN CANCER melanoma 2 sites and multiple squamous cell  ? Diabetes mellitus without complication ([place]   ? GERD (gastroesophageal reflux disease)   ? Glaucoma   ? Hyperlipidemia   ? Hypertension   ? Myocardial infarction ([facility]   ? 08/27/1979  ? Vertigo   ? ?Past Surgical History:  ?Procedure Laterality Date  ? CATARACT EXTRACTION W/ INTRAOCULAR LENS  IMPLANT, BILATERAL    ? CIRCUMCISION    ? COLONOSCOPY WITH PROPOFOL N/A 11/17/2021  ? Procedure: COLONOSCOPY WITH PROPOFOL;  Surgeon: AJonathon Bellows MD;  [facility];  Service: Gastroenterology;  Laterality: N/A;  ? 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/10/2019  ? GANGLION CYST EXCISION    ? LEFT HAND  ? LUMBAR LAMINECTOMY/DECOMPRESSION MICRODISCECTOMY  09/13/2017  ? Procedure: BILATERAL Lumbar three-four , Left  Lumbar four-five  AND Left  L5-S1, LAMINECTOMY/FORAMINOTOMY;  Surgeon: CKary Kos MD;  [facility]  Service: Neurosurgery;;  ? MELANOMA EXCISION  07/2011  ? TONSILLECTOMY    ? VASECTOMY    ? ? ?Current Outpatient Medications:  ?  Accu-Chek FastClix Lancets MISC, CHECK  BLOOD  SUGAR TWICE DAILY, Disp: 204 each, Rfl: 1 ?  allopurinol (ZYLOPRIM) 100 MG tablet, Take 200 mg by mouth daily., Disp: , Rfl:  ?  amLODipine-benazepril (LOTREL) 10-40 MG capsule, TAKE 1 CAPSULE BY MOUTH DAILY. (IN  PLACE OF AMLODIPINE 5MG AND LISINOPRIL 40MG), Disp: 90 capsule, Rfl: 1 ?  aspirin 81 MG tablet, Take 81 mg by mouth., Disp: , Rfl:  ?  atorvastatin (LIPITOR) 10 MG tablet, TAKE 1 TABLET DAILY AT 6 PM., Disp: 90 tablet, Rfl: 1 ?  Blood Glucose Calibration (ACCU-CHEK AVIVA) SOLN, 1 Bottle by In Vitro route as needed., Disp: 1 each, Rfl: 2 ?  Blood Glucose Monitoring Suppl (ACCU-CHEK NANO SMARTVIEW) W/DEVICE KIT, USE AS DIRECTED, Disp: 1 kit, Rfl: 0 ?  Cholecalciferol (VITAMIN D) 2000 units tablet, Take 2,000 Units by mouth daily., Disp: , Rfl:  ?  dorzolamide-timolol (COSOPT) 22.3-6.8 MG/ML ophthalmic solution, Place 1 drop into the right eye 2 (two) times daily., Disp: , Rfl:  ?  doxazosin (CARDURA) 4 MG tablet, Take 1 tablet (4 mg total) by mouth at bedtime., Disp: 90 tablet, Rfl: 3 ?  finasteride (PROSCAR) 5 MG tablet, Take 1 tablet (5 mg total) by mouth at bedtime., Disp: 90 tablet, Rfl: 3 ?  glucose blood (COOL BLOOD GLUCOSE TEST STRIPS) test strip, Use as instructed, Disp: 100 each, Rfl: 12 ?  insulin glargine (LANTUS SOLOSTAR) 100 UNIT/ML Solostar Pen, Inject 46 Units into the skin daily., Disp: 30 mL, Rfl: 0 ?  Insulin Pen Needle 31G X 5 MM MISC, 1 Device by Does not apply route as needed., Disp: 120 each, Rfl: 2 ?  JARDIANCE

## 2021-11-22 NOTE — Patient Instructions (Signed)

## 2021-11-23 ENCOUNTER — Telehealth: Payer: Self-pay | Admitting: Gastroenterology

## 2021-11-23 NOTE — Telephone Encounter (Signed)
Patient left vm to reschedule appt. My understanding is that he thought it was this month. His appt is in May.  ?

## 2021-11-24 ENCOUNTER — Telehealth: Payer: Self-pay

## 2021-11-24 NOTE — Telephone Encounter (Signed)
Called patient and reminded him of his may 16 appointment  ?

## 2021-11-24 NOTE — Telephone Encounter (Signed)
Called patient back and he stated that someone else had called him and got things straighten out. ?

## 2021-12-01 DIAGNOSIS — Z0100 Encounter for examination of eyes and vision without abnormal findings: Secondary | ICD-10-CM | POA: Diagnosis not present

## 2021-12-01 DIAGNOSIS — H401132 Primary open-angle glaucoma, bilateral, moderate stage: Secondary | ICD-10-CM | POA: Diagnosis not present

## 2021-12-02 DIAGNOSIS — H524 Presbyopia: Secondary | ICD-10-CM | POA: Diagnosis not present

## 2021-12-02 DIAGNOSIS — H52223 Regular astigmatism, bilateral: Secondary | ICD-10-CM | POA: Diagnosis not present

## 2021-12-02 DIAGNOSIS — Z961 Presence of intraocular lens: Secondary | ICD-10-CM | POA: Diagnosis not present

## 2021-12-04 ENCOUNTER — Other Ambulatory Visit: Payer: Self-pay | Admitting: Family Medicine

## 2021-12-14 ENCOUNTER — Ambulatory Visit (INDEPENDENT_AMBULATORY_CARE_PROVIDER_SITE_OTHER): Payer: Medicare HMO

## 2021-12-14 DIAGNOSIS — Z0000 Encounter for general adult medical examination without abnormal findings: Secondary | ICD-10-CM

## 2021-12-14 NOTE — Patient Instructions (Signed)
Tracy [name] , ?Thank you for taking time to come for your Medicare Wellness Visit. I appreciate your ongoing commitment to your health goals. Please review the following plan we discussed and let me know if I can assist you in the future.  ? ?Screening recommendations/referrals: ?Colonoscopy: no longer required ?Recommended yearly ophthalmology/optometry visit for glaucoma screening and checkup ?Recommended yearly dental visit for hygiene and checkup ? ?Vaccinations: ?Influenza vaccine: 06/14/21 ?Pneumococcal vaccine: done 07/07/14 ?Tdap vaccine: done 11/28/16 ?Shingles vaccine: please bring a copy of your vaccine record if available   ?Covid-19: done 09/27/19, 10/18/19, 06/08/20 ? ?Advanced directives: Please bring a copy of your health care power of attorney and living will to the office at your convenience.  ? ?Conditions/risks identified: Keep up the great work! ? ?Next appointment: Follow up in one year for your annual wellness visit.  ? ?Preventive Care 17 Years and Older, Male ?Preventive care refers to lifestyle choices and visits with your health care provider that can promote health and wellness. ?What does preventive care include? ?A yearly physical exam. This is also called an annual well check. ?Dental exams once or twice a year. ?Routine eye exams. Ask your health care provider how often you should have your eyes checked. ?Personal lifestyle choices, including: ?Daily care of your teeth and gums. ?Regular physical activity. ?Eating a healthy diet. ?Avoiding tobacco and drug use. ?Limiting alcohol use. ?Practicing safe sex. ?Taking low doses of aspirin every day. ?Taking vitamin and mineral supplements as recommended by your health care provider. ?What happens during an annual well check? ?The services and screenings done by your health care provider during your annual well check will depend on your age, overall health, lifestyle risk factors, and family history of disease. ?Counseling  ?Your health care  provider may ask you questions about your: ?Alcohol use. ?Tobacco use. ?Drug use. ?Emotional well-being. ?Home and relationship well-being. ?Sexual activity. ?Eating habits. ?History of falls. ?Memory and ability to understand (cognition). ?Work and work Statistician. ?Screening  ?You may have the following tests or measurements: ?Height, weight, and BMI. ?Blood pressure. ?Lipid and cholesterol levels. These may be checked every 5 years, or more frequently if you are over 71 years old. ?Skin check. ?Lung cancer screening. You may have this screening every year starting at age 41 if you have a 30-pack-year history of smoking and currently smoke or have quit within the past 15 years. ?Fecal occult blood test (FOBT) of the stool. You may have this test every year starting at age 78. ?Flexible sigmoidoscopy or colonoscopy. You may have a sigmoidoscopy every 5 years or a colonoscopy every 10 years starting at age 45. ?Prostate cancer screening. Recommendations will vary depending on your family history and other risks. ?Hepatitis C blood test. ?Hepatitis B blood test. ?Sexually transmitted disease (STD) testing. ?Diabetes screening. This is done by checking your blood sugar (glucose) after you have not eaten for a while (fasting). You may have this done every 1-3 years. ?Abdominal aortic aneurysm (AAA) screening. You may need this if you are a current or former smoker. ?Osteoporosis. You may be screened starting at age 76 if you are at high risk. ?Talk with your health care provider about your test results, treatment options, and if necessary, the need for more tests. ?Vaccines  ?Your health care provider may recommend certain vaccines, such as: ?Influenza vaccine. This is recommended every year. ?Tetanus, diphtheria, and acellular pertussis (Tdap, Td) vaccine. You may need a Td booster every 10 years. ?Zoster vaccine. You may  need this after age 70. ?Pneumococcal 13-valent conjugate (PCV13) vaccine. One dose is  recommended after age 71. ?Pneumococcal polysaccharide (PPSV23) vaccine. One dose is recommended after age 5. ?Talk to your health care provider about which screenings and vaccines you need and how often you need them. ?This information is not intended to replace advice given to you by your health care provider. Make sure you discuss any questions you have with your health care provider. ?Document Released: 09/25/2015 Document Revised: 05/18/2016 Document Reviewed: 06/30/2015 ?Elsevier Interactive Patient Education ? 2017 [place]. ? ?Fall Prevention in the Home ?Falls can cause injuries. They can happen to people of all ages. There are many things you can do to make your home safe and to help prevent falls. ?What can I do on the outside of my home? ?Regularly fix the edges of walkways and driveways and fix any cracks. ?Remove anything that might make you trip as you walk through a door, such as a raised step or threshold. ?Trim any bushes or trees on the path to your home. ?Use bright outdoor lighting. ?Clear any walking paths of anything that might make someone trip, such as rocks or tools. ?Regularly check to see if handrails are loose or broken. Make sure that both sides of any steps have handrails. ?Any raised decks and porches should have guardrails on the edges. ?Have any leaves, snow, or ice cleared regularly. ?Use sand or salt on walking paths during winter. ?Clean up any spills in your garage right away. This includes oil or grease spills. ?What can I do in the bathroom? ?Use night lights. ?Install grab bars by the toilet and in the tub and shower. Do not use towel bars as grab bars. ?Use non-skid mats or decals in the tub or shower. ?If you need to sit down in the shower, use a plastic, non-slip stool. ?Keep the floor dry. Clean up any water that spills on the floor as soon as it happens. ?Remove soap buildup in the tub or shower regularly. ?Attach bath mats securely with double-sided non-slip rug  tape. ?Do not have throw rugs and other things on the floor that can make you trip. ?What can I do in the bedroom? ?Use night lights. ?Make sure that you have a light by your bed that is easy to reach. ?Do not use any sheets or blankets that are too big for your bed. They should not hang down onto the floor. ?Have a firm chair that has side arms. You can use this for support while you get dressed. ?Do not have throw rugs and other things on the floor that can make you trip. ?What can I do in the kitchen? ?Clean up any spills right away. ?Avoid walking on wet floors. ?Keep items that you use a lot in easy-to-reach places. ?If you need to reach something above you, use a strong step stool that has a grab bar. ?Keep electrical cords out of the way. ?Do not use floor polish or wax that makes floors slippery. If you must use wax, use non-skid floor wax. ?Do not have throw rugs and other things on the floor that can make you trip. ?What can I do with my stairs? ?Do not leave any items on the stairs. ?Make sure that there are handrails on both sides of the stairs and use them. Fix handrails that are broken or loose. Make sure that handrails are as long as the stairways. ?Check any carpeting to make sure that it is firmly attached  to the stairs. Fix any carpet that is loose or worn. ?Avoid having throw rugs at the top or bottom of the stairs. If you do have throw rugs, attach them to the floor with carpet tape. ?Make sure that you have a light switch at the top of the stairs and the bottom of the stairs. If you do not have them, ask someone to add them for you. ?What else can I do to help prevent falls? ?Wear shoes that: ?Do not have high heels. ?Have rubber bottoms. ?Are comfortable and fit you well. ?Are closed at the toe. Do not wear sandals. ?If you use a stepladder: ?Make sure that it is fully opened. Do not climb a closed stepladder. ?Make sure that both sides of the stepladder are locked into place. ?Ask someone to  hold it for you, if possible. ?Clearly mark and make sure that you can see: ?Any grab bars or handrails. ?First and last steps. ?Where the edge of each step is. ?Use tools that help you move around (mobility aids) if

## 2021-12-14 NOTE — Progress Notes (Signed)
? ?Subjective:  ? Tracy [name]. is a 88 y.o. male who presents for Medicare Annual/Subsequent preventive examination. ? ?Virtual Visit via Telephone Note ? ?I connected with  Tracy [name]. on 12/14/21 at  8:15 AM EDT by telephone and verified that I am speaking with the correct person using two identifiers. ? ?Location: ?Patient: home ?Provider: [facility] ?Persons participating in the virtual visit: patient/Nurse Health Advisor ?  ?I discussed the limitations, risks, security and privacy concerns of performing an evaluation and management service by telephone and the availability of in person appointments. The patient expressed understanding and agreed to proceed. ? ?Interactive audio and video telecommunications were attempted between this nurse and patient, however failed, due to patient having technical difficulties OR patient did not have access to video capability.  We continued and completed visit with audio only. ? ?Some vital signs may be absent or patient reported.  ? ?Clemetine Marker, LPN ? ? ?Review of Systems    ? ?Cardiac Risk Factors include: advanced age (>14mn, >>70women);diabetes mellitus;dyslipidemia;male gender;hypertension ? ?   ?Objective:  ?  ?Today's Vitals  ? 12/14/21 1034  ?PainSc: 3   ? ?There is no height or weight on file to calculate BMI. ? ? ?  12/14/2021  ? 10:39 AM 11/17/2021  ?  9:26 AM 11/17/2020  ?  8:49 AM 11/14/2019  ?  9:00 AM 11/09/2018  ?  9:04 AM 08/13/2018  ?  1:27 PM 11/07/2017  ?  9:02 AM  ?Advanced Directives  ?Does Patient Have a Medical Advance Directive? _0  Yes Yes  ?Type of Advance Directive Living will  [place] will [place] will [place] will [place] will  ?Copy of [place]?   No - copy requested No - copy requested No - copy requested  No - copy requested  ? ? ?Current  Medications (verified) ?Outpatient Encounter Medications as of 12/14/2021  ?Medication Sig  ? Accu-Chek FastClix Lancets MISC CHECK  BLOOD  SUGAR TWICE DAILY  ? allopurinol (ZYLOPRIM) 100 MG tablet Take 200 mg by mouth daily.  ? amLODipine-benazepril (LOTREL) 10-40 MG capsule TAKE 1 CAPSULE BY MOUTH DAILY. (IN PLACE OF AMLODIPINE 5MG AND LISINOPRIL 40MG)  ? aspirin 81 MG tablet Take 81 mg by mouth.  ? atorvastatin (LIPITOR) 10 MG tablet TAKE 1 TABLET DAILY AT 6 PM.  ? Blood Glucose Monitoring Suppl (ACCU-CHEK NANO SMARTVIEW) W/DEVICE KIT USE AS DIRECTED  ? Cholecalciferol (VITAMIN D) 2000 units tablet Take 2,000 Units by mouth daily.  ? dorzolamide-timolol (COSOPT) 22.3-6.8 MG/ML ophthalmic solution Place 1 drop into the right eye 2 (two) times daily.  ? doxazosin (CARDURA) 4 MG tablet Take 1 tablet (4 mg total) by mouth at bedtime.  ? DROPLET PEN NEEDLES 31G X 5 MM MISC USE AS NEEDED.  ? finasteride (PROSCAR) 5 MG tablet Take 1 tablet (5 mg total) by mouth at bedtime.  ? glucose blood (COOL BLOOD GLUCOSE TEST STRIPS) test strip Use as instructed  ? insulin glargine (LANTUS SOLOSTAR) 100 UNIT/ML Solostar Pen Inject 46 Units into the skin daily.  ? JARDIANCE 10 MG TABS tablet Take 1 tablet (10 mg total) by mouth daily.  ? Multiple Vitamins-Minerals (CENTRUM SILVER 50+MEN) TABS Take 1 tablet by mouth daily.  ? naproxen (NAPROSYN) 500 MG tablet Take 1 tablet (500 mg total) by mouth 2 (two) times daily with a  meal.  ? omega-3 fish oil (MAXEPA) 1000 MG CAPS capsule Take 2 capsules by mouth daily.   ? omeprazole (PRILOSEC) 40 MG capsule TAKE 1 CAPSULE EVERY DAY  ? vitamin B-12 (CYANOCOBALAMIN) 500 MCG tablet Take 1 tablet by mouth daily.  ? [DISCONTINUED] Blood Glucose Calibration (ACCU-CHEK AVIVA) SOLN 1 Bottle by In Vitro route as needed.  ? [DISCONTINUED] febuxostat (ULORIC) 40 MG tablet Take 40 mg by mouth daily.  ? ?No facility-administered encounter medications on file as of 12/14/2021.  ? ? ?Allergies  (verified) ?Tetracyclines & related  ? ?History: ?Past Medical History:  ?Diagnosis Date  ? Arthritis   ? Cancer [facility])   ? SKIN CANCER melanoma 2 sites and multiple squamous cell  ? Diabetes mellitus without complication ([place])   ? GERD (gastroesophageal reflux disease)   ? Glaucoma   ? Hyperlipidemia   ? Hypertension   ? Myocardial infarction [facility])   ? 08/27/1979  ? Vertigo   ? ?Past Surgical History:  ?Procedure Laterality Date  ? CATARACT EXTRACTION W/ INTRAOCULAR LENS  IMPLANT, BILATERAL    ? CIRCUMCISION    ? COLONOSCOPY WITH PROPOFOL N/A 11/17/2021  ? Procedure: COLONOSCOPY WITH PROPOFOL;  Surgeon: Jonathon Bellows, MD;  Location[facility] ENDOSCOPY;  Service: Gastroenterology;  Laterality: N/A;  ? 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/10/2019  ? GANGLION CYST EXCISION    ? LEFT HAND  ? LUMBAR LAMINECTOMY/DECOMPRESSION MICRODISCECTOMY  09/13/2017  ? Procedure: BILATERAL Lumbar three-four , Left  Lumbar four-five  AND Left  L5-S1, LAMINECTOMY/FORAMINOTOMY;  Surgeon: [name], MD;  Location: [facility];  Service: Neurosurgery;;  ? MELANOMA EXCISION  07/2011  ? TONSILLECTOMY    ? VASECTOMY    ? ?Family History  ?Problem Relation Age of Onset  ? Heart disease Mother   ? Diabetes Mother   ?     type 2  ? Angina Mother   ? Heart disease Brother   ? Pneumonia Father   ? Heart attack Father   ? Alcoholism Daughter   ? Cirrhosis Daughter   ? ?Social History  ? ?Socioeconomic History  ? Marital status: Married  ?  Spouse name: [name]  ? Number of children: 4  ? Years of education: Not on file  ? Highest education level: Bachelor's degree (e.g., BA, AB, BS)  ?Occupational History  ? Occupation: Retired  ?Tobacco Use  ? Smoking status: Former  ?  Packs/day: 2.00  ?  Years: 13.00  ?  Pack years: 26.00  ?  Types: Cigarettes  ?  Quit date: 1963  ?  Years since quitting: 60.2  ? Smokeless tobacco: Never  ? Tobacco comments:  ?  smoking cessation materials not required  ?Vaping Use  ? Vaping Use: Never used  ?Substance and Sexual Activity  ? Alcohol  use: Yes  ?  Alcohol/week: 2.0 standard drinks  ?  Types: 2 Shots of liquor per week  ?  Comment: occasional  ? Drug use: No  ? Sexual activity: Not Currently  ?Other Topics Concern  ? Not on file  ?Social History Narrative  ? Daughter Manuela Schwartz passed away 10/10/2018.  ? ?Social Determinants of Health  ? ?Financial Resource Strain: Low Risk   ? Difficulty of Paying Living Expenses: Not hard at all  ?Food Insecurity: No Food Insecurity  ? Worried About Charity fundraiser in the Last Year: Never true  ? Ran Out of Food in the Last Year: Never true  ?Transportation Needs: No Transportation Needs  ? Lack of Transportation (  Medical): No  ? Lack of Transportation (Non-Medical): No  ?Physical Activity: Unknown  ? Days of Exercise per Week: 7 days  ? Minutes of Exercise per Session: Not on file  ?Stress: No Stress Concern Present  ? Feeling of Stress : Not at all  ?Social Connections: Moderately Integrated  ? Frequency of Communication with Friends and Family: More than three times a week  ? Frequency of Social Gatherings with Friends and Family: Three times a week  ? Attends Religious Services: Never  ? Active Member of Clubs or Organizations: Yes  ? Attends Archivist Meetings: More than 4 times per year  ? Marital Status: Married  ? ? ?Tobacco Counseling ?Counseling given: Not Answered ?Tobacco comments: smoking cessation materials not required ? ? ?Clinical Intake: ? ?Pre-visit preparation completed: Yes ? ?Pain : 0-10 ?Pain Score: 3  ?Pain Type: Acute pain ?Pain Location: Heel ?Pain Orientation: Right ?Pain Descriptors / Indicators: Aching ?Pain Onset: More than a month ago ?Pain Frequency: Intermittent ?Effect of Pain on Daily Activities: pain when walking; okay while sitting ? ?  ? ?Nutritional Risks: None ?Diabetes: Yes ?CBG done?: No ?Did pt. bring in CBG monitor from home?: No ? ?How often do you need to have someone help you when you read instructions, pamphlets, or other written materials from your doctor  or pharmacy?: 1 - Never ? ?Nutrition Risk Assessment: ? ?Has the patient had any N/V/D within the last 2 months?  No  ?Does the patient have any non-healing wounds?  No  ?Has the patient had any unintentional weight loss or w

## 2021-12-22 ENCOUNTER — Encounter: Payer: Self-pay | Admitting: Podiatry

## 2021-12-22 ENCOUNTER — Ambulatory Visit: Payer: Medicare HMO | Admitting: Podiatry

## 2021-12-22 DIAGNOSIS — S86011A Strain of right Achilles tendon, initial encounter: Secondary | ICD-10-CM | POA: Diagnosis not present

## 2021-12-22 DIAGNOSIS — M7661 Achilles tendinitis, right leg: Secondary | ICD-10-CM

## 2021-12-22 NOTE — Progress Notes (Signed)
He presents today for follow-up of his Achilles tendinitis right states that is not any better I have worn the boot slip and it and it does not feel any better and I am just frustrated with it.  He goes on to say that he really wore the boot conscientiously over the weekend though he does not wear it at all times and states that he is continue to play golf.  He states he has not done the stretching exercises because he felt that that was counterintuitive considering he was supposed to wear a boot. ? ?Objective: Vital signs are stable he is alert and oriented x3.  Pulses are palpable.  He still has moderate severe tenderness in the mid substance of the Achilles. ? ? ? ?Assessment probable intrasubstance tear of the Achilles right heel.  This would be within the midportion of the Achilles not down on the heel. ? ?Plan: Discussed etiology pathology conservative surgical therapies at this point I recommend him to continue to wear the boot at all times start his exercises utilize Voltaren gel and we are requesting MRI for surgical versus nonsurgical evaluation. ?

## 2021-12-31 ENCOUNTER — Ambulatory Visit
Admission: RE | Admit: 2021-12-31 | Discharge: 2021-12-31 | Disposition: A | Discharge status: Home / Self Care | Payer: Medicare HMO | Source: Ambulatory Visit | Attending: Podiatry | Admitting: Podiatry

## 2021-12-31 DIAGNOSIS — R600 Localized edema: Secondary | ICD-10-CM | POA: Diagnosis not present

## 2021-12-31 DIAGNOSIS — M7731 Calcaneal spur, right foot: Secondary | ICD-10-CM | POA: Diagnosis not present

## 2021-12-31 DIAGNOSIS — S86011A Strain of right Achilles tendon, initial encounter: Secondary | ICD-10-CM

## 2022-01-05 ENCOUNTER — Ambulatory Visit: Payer: Medicare HMO | Admitting: Podiatry

## 2022-01-05 DIAGNOSIS — S86011A Strain of right Achilles tendon, initial encounter: Secondary | ICD-10-CM | POA: Diagnosis not present

## 2022-01-05 NOTE — Patient Instructions (Signed)
Look for an "EvenUp" shoe attachment on [employer] or at [employer]. This will level out your hips while you are walking in the CAM boot. Wear this on the other foot around a supportive sneaker:     

## 2022-01-06 LAB — HM DIABETES EYE EXAM

## 2022-01-09 NOTE — Progress Notes (Signed)
?  Subjective:  ?Patient ID: Tracy Favors., male    DOB: 1930-06-08,  MRN: [id number] ? ?Chief Complaint  ?Patient presents with  ? Tendonitis  ?     MRI FOLLOW UP, Achilles right, PER DR HYATT  ? ? ?88 y.o. male presents with the above complaint. History confirmed with patient.  He was referred to me for an Achilles injury that has developed over the last few months.  Dr. Milinda Pointer had recommended nonoperative treatment with home therapy and a night splint.  After failing this he recommended MRI which she recently completed.  It is still quite painful. ? ?Objective:  ?Physical Exam: ?warm, good capillary refill, no trophic changes or ulcerative lesions, normal DP and PT pulses, and normal sensory exam. ?Left Foot: normal exam, no swelling, tenderness, instability; ligaments intact, full range of motion of all ankle/foot joints ?Right Foot:  Pain over the mid substance of the Achilles tendon in the watershed area, minimal at the insertion, there is gastrocnemius equinus. ? ? ?MRI dated 12/31/2021 shows a partial-thickness midsubstance tear with moderate tendinosis there is no tendon gap or retraction and peritendinous fluid ?Assessment:  ? ?1. Partial rupture of Achilles tendon, right, initial encounter   ? ? ? ?Plan:  ?Patient was evaluated and treated and all questions answered. ? ?Reviewed the results of the MRI in detail the patient and his wife.  We discussed operative and nonoperative treatment.  I discussed with him that this likely will not require operative treatment.  Additionally his age and medical comorbidities he would be high risk candidate for any sort of surgery.  I recommended nonoperative treatment and weightbearing as tolerated in a cam boot with Achilles wedges which were dispensed today.  I recommend getting an even up shoe balance or online and use of a cane or walker for balance.  We will plan to do this for about 4 weeks and then begin physical therapy with an eccentric training exercise  regimen.  Gradual reduction in the plantarflexion wedges at that point.  We discussed the possibility of eventual rupture even with this and possibility of retear as well. ? ?Return in about 4 weeks (around 02/02/2022) for follow up on Achilles tendon tear.  ? ?

## 2022-01-25 ENCOUNTER — Encounter: Payer: Self-pay | Admitting: Gastroenterology

## 2022-01-25 ENCOUNTER — Ambulatory Visit: Payer: Medicare HMO | Admitting: Gastroenterology

## 2022-01-25 VITALS — BP 142/77 | HR 60 | Temp 98.1°F | Wt 170.0 lb

## 2022-01-25 DIAGNOSIS — K5900 Constipation, unspecified: Secondary | ICD-10-CM | POA: Diagnosis not present

## 2022-01-25 NOTE — Progress Notes (Signed)
?  ?  MD, MRCP(U.K) ?[address]  ?[address]  ?[place], [place] [address]  ?Main: [phone]  ?Fax: [phone] ? ? ?Primary Care Physician: Sowles, Krichna, MD ? ?Primary Gastroenterologist:  Dr.    ? ?Chief Complaint  ?Patient presents with  ? Change in bowel habits  ? ? ?HPI: Tracy G Pacini Jr. is a 88 y.o. male ? ? ?Summary of history : ? ?Initially referred and seen on 11/10/2021 for change in bowel habits.  History of suggestive of constipation.  Commenced on MiraLAX 1 capful twice daily. ? ?Interval history   11/10/2021-01/25/2022 ? ?11/17/2021: Colonoscopy: 15 mm polyp resected in the proximal ascending colon via EMR.  Pathology report demonstrated it was a tubular adenoma. ? ?States that he has been taking MiraLAX and in addition taking senna and yet struggles in the morning to have a soft bowel movement he feels like a lot of pressure when he has a bowel movement and feels like the stool "is swiggly" ? ?Current Outpatient Medications  ?Medication Sig Dispense Refill  ? Accu-Chek FastClix Lancets MISC CHECK  BLOOD  SUGAR TWICE DAILY 204 each 1  ? allopurinol (ZYLOPRIM) 100 MG tablet Take 200 mg by mouth daily.    ? amLODipine-benazepril (LOTREL) 10-40 MG capsule TAKE 1 CAPSULE BY MOUTH DAILY. (IN PLACE OF AMLODIPINE 5MG AND LISINOPRIL 40MG) 90 capsule 1  ? aspirin 81 MG tablet Take 81 mg by mouth.    ? atorvastatin (LIPITOR) 10 MG tablet TAKE 1 TABLET DAILY AT 6 PM. 90 tablet 1  ? Blood Glucose Monitoring Suppl (ACCU-CHEK NANO SMARTVIEW) W/DEVICE KIT USE AS DIRECTED 1 kit 0  ? Cholecalciferol (VITAMIN D) 2000 units tablet Take 2,000 Units by mouth daily.    ? dorzolamide-timolol (COSOPT) 22.3-6.8 MG/ML ophthalmic solution Place 1 drop into the right eye 2 (two) times daily.    ? doxazosin (CARDURA) 4 MG tablet Take 1 tablet (4 mg total) by mouth at bedtime. 90 tablet 3  ? DROPLET PEN NEEDLES 31G X 5 MM MISC USE AS NEEDED. 100 each 3  ? finasteride (PROSCAR) 5 MG tablet Take 1 tablet  (5 mg total) by mouth at bedtime. 90 tablet 3  ? glucose blood (COOL BLOOD GLUCOSE TEST STRIPS) test strip Use as instructed 100 each 12  ? insulin glargine (LANTUS SOLOSTAR) 100 UNIT/ML Solostar Pen Inject 46 Units into the skin daily. 30 mL 0  ? JARDIANCE 10 MG TABS tablet Take 1 tablet (10 mg total) by mouth daily. 90 tablet 1  ? Multiple Vitamins-Minerals (CENTRUM SILVER 50+MEN) TABS Take 1 tablet by mouth daily.    ? naproxen (NAPROSYN) 500 MG tablet Take 1 tablet (500 mg total) by mouth 2 (two) times daily with a meal. 30 tablet 0  ? omega-3 fish oil (MAXEPA) 1000 MG CAPS capsule Take 2 capsules by mouth daily.     ? omeprazole (PRILOSEC) 40 MG capsule TAKE 1 CAPSULE EVERY DAY 90 capsule 1  ? vitamin B-12 (CYANOCOBALAMIN) 500 MCG tablet Take 1 tablet by mouth daily.    ? ?No current facility-administered medications for this visit.  ? ? ?Allergies as of 01/25/2022 - Review Complete 01/25/2022  ?Allergen Reaction Noted  ? Tetracyclines & related Other (See Comments) 01/14/2013  ? ? ?ROS: ? ?General: Negative for anorexia, weight loss, fever, chills, fatigue, weakness. ?ENT: Negative for hoarseness, difficulty swallowing , nasal congestion. ?CV: Negative for chest pain, angina, palpitations, dyspnea on exertion, peripheral edema.  ?Respiratory: Negative for dyspnea at rest, dyspnea   on exertion, cough, sputum, wheezing.  ?GI: See history of present illness. ?GU:  Negative for dysuria, hematuria, urinary incontinence, urinary frequency, nocturnal urination.  ?Endo: Negative for unusual weight change.  ?  ?Physical Examination: ? ? BP (!) 142/77   Pulse 60   Temp 98.1 ?F (36.7 ?C) (Oral)   Wt 170 lb (77.1 kg)   BMI 24.39 kg/m?  ? ?General: Well-nourished, well-developed in no acute distress.  ?Eyes: No icterus. Conjunctivae pink. ?Neuro: Alert and oriented x 3.  Grossly intact. ?Skin: Warm and dry, no jaundice.   ?Psych: Alert and cooperative, normal mood and affect. ? ? ?Imaging Studies: ?MR ANKLE RIGHT WO  CONTRAST ? ?Result Date: 12/31/2021 ?CLINICAL DATA:  Ankle pain for 6 weeks. Tendon abnormality suspected. Evaluate for Achilles tendon tear. Surgical consideration for high midsubstance Achilles tendon tear. EXAM: MRI OF THE RIGHT ANKLE WITHOUT CONTRAST TECHNIQUE: Multiplanar, multisequence MR imaging of the ankle was performed. No intravenous contrast was administered. COMPARISON:  Right foot radiographs 08/09/2021 FINDINGS: TENDONS Peroneal: The peroneus longus and brevis tendons are intact. Posteromedial: Intact tibialis posterior, flexor digitorum longus, and flexor hallucis longus tendons. Anterior: The tibialis anterior, extensor hallucis longus, and extensor digitorum longus tendons are intact. Achilles: There is moderate fusiform thickening of the proximal Achilles tendon greatest approximately 5.9 cm proximal to the distal Achilles tendon insertion and with the Achilles tendon thickening spanning an approximate 6 cm craniocaudal dimension (sagittal series 7, image 14). There is associated intermediate T2 signal and also tiny focal fluid bright signal within the lateral midsubstance of the tendon in the region measuring up to 2 mm in transverse dimension (axial image 10) and 7 mm in craniocaudal dimension (sagittal image 14). No tendon gap or retraction. Trace fluid within the pre Achilles bursa. Moderate chronic spurring at the posterior Achilles tendon insertion on the calcaneus with mild fluid in between the spur and distal Achilles tendon insertion (sagittal series 7, images 14 and 15). Plantar Fascia: Tiny plantar calcaneal heel spur. No plantar fasciitis. LIGAMENTS Lateral: The anterior and posterior talofibular, anterior and posterior tibiofibular, and calcaneofibular ligaments are intact. Medial: The tibiotalar deep deltoid and tibial spring ligaments are intact. CARTILAGE Ankle Joint: Intact cartilage. Subtalar Joints/Sinus Tarsi: Mild edema within the sinus tarsi. Bones: There is mild subchondral  cystic change and at least moderate cartilage thinning at the fourth carpometacarpal joint (sagittal series 7, image 11). Other: The tarsal tunnel is unremarkable. The Lisfranc ligament complex is intact. IMPRESSION: 1. Moderate fusiform thickening of the proximal, avascular zone of the Achilles tendon with moderate tendinosis and a small partial-thickness midsubstance tear. No tendon gap or retraction. The distal Achilles tendon insertion is intact with mild fluid both anterior and posterior to the distal Achilles tendon insertion. There is also moderate chronic spurring at the posterior aspect of the Achilles insertion on the calcaneus. 2. Tiny plantar calcaneal heel spur.  No plantar fasciitis. 3. Mild-to-moderate fourth tarsometatarsal osteoarthritis. Electronically Signed   By: Yvonne Kendall M.D.   On: 12/31/2021 13:00   ? ?Assessment and Plan:  ? ?Tracy [name]. is a 88 y.o. y/o male here to follow-up for constipation.  Commenced on MiraLAX 1 capful twice daily.  Colonoscopy showed no abnormality except a 15 mm tubular adenoma that was resected.  Has not responded well to combination of MiraLAX senna and docusate.  Advised him to stop all of this and commence on a trial of Linzess 145 mcg daily.  2-week samples are provided.  Advised him if  it works and give him a 90-day prescription if it does not work I can give him a higher dose.  If it causes diarrhea let me know and I will reduce the dose. ? ? ? ?Dr    MD,MRCP (U.K) ?Follow up in as needed ?

## 2022-01-25 NOTE — Patient Instructions (Signed)
Please give [place] a call if Linzess 145 mcg is working for you so we could send in a prescription. ?

## 2022-02-02 ENCOUNTER — Ambulatory Visit: Payer: Medicare HMO | Admitting: Podiatry

## 2022-02-02 DIAGNOSIS — S86011A Strain of right Achilles tendon, initial encounter: Secondary | ICD-10-CM

## 2022-02-02 NOTE — Patient Instructions (Signed)
Call  [phone] to schedule PT  If you are feeling better in 4 weeks can resume walking for exercise  Wear supportive sneakers or running shoes

## 2022-02-02 NOTE — Progress Notes (Signed)
  Subjective:  Patient ID: Tracy [name]., male    DOB: 10-21-1929,  MRN: [id number]  Chief Complaint  Patient presents with   Tendonitis    ACHILLES TENDON TEAR right    88 y.o. male presents with the above complaint. History confirmed with patient.  He returns for follow-up today having little to no pain doing much better he presents in regular shoes today.  Says the boot is falling out he no longer has the heel lifts.  Objective:  Physical Exam: warm, good capillary refill, no trophic changes or ulcerative lesions, normal DP and PT pulses, and normal sensory exam. Left Foot: normal exam, no swelling, tenderness, instability; ligaments intact, full range of motion of all ankle/foot joints Right Foot: Little to no pain in mid substance of Achilles today, good plantarflexion strength 5 out of 5   MRI dated 12/31/2021 shows a partial-thickness midsubstance tear with moderate tendinosis there is no tendon gap or retraction and peritendinous fluid Assessment:   1. Partial rupture of Achilles tendon, right, initial encounter      Plan:  Patient was evaluated and treated and all questions answered.  Doing much better now he has been in the boot for 4 weeks.  I think he can gradually transition back to regular shoe gear, I recommended supportive shoe gear such as running shoe or sneaker do not wear crocs.  I also recommend he begin physical therapy to transition back to his regular activity over the next few weeks and a referral was sent to [facility] PT.  I discussed with him that in 4 weeks from today he may begin gradual walking for exercise again but for now would stick with physical therapy and low impact activity.  I will see him back in 6 weeks for reevaluation.  Advised on risk of full rupture and return ASAP if this develops.  Return in about 6 weeks (around 03/16/2022) for re-check Achilles tendon.

## 2022-02-03 ENCOUNTER — Other Ambulatory Visit: Payer: Self-pay

## 2022-02-03 MED ORDER — LINACLOTIDE 145 MCG PO CAPS: 145.0000 ug | ORAL_CAPSULE | Freq: Every day | ORAL | 11 refills | Status: DC

## 2022-02-09 ENCOUNTER — Other Ambulatory Visit: Payer: Self-pay | Admitting: Family Medicine

## 2022-02-09 DIAGNOSIS — Z794 Long term (current) use of insulin: Secondary | ICD-10-CM

## 2022-02-16 DIAGNOSIS — R2689 Other abnormalities of gait and mobility: Secondary | ICD-10-CM | POA: Diagnosis not present

## 2022-02-16 DIAGNOSIS — M25571 Pain in right ankle and joints of right foot: Secondary | ICD-10-CM | POA: Diagnosis not present

## 2022-02-21 DIAGNOSIS — R2689 Other abnormalities of gait and mobility: Secondary | ICD-10-CM | POA: Diagnosis not present

## 2022-02-21 DIAGNOSIS — M25571 Pain in right ankle and joints of right foot: Secondary | ICD-10-CM | POA: Diagnosis not present

## 2022-02-24 DIAGNOSIS — M25571 Pain in right ankle and joints of right foot: Secondary | ICD-10-CM | POA: Diagnosis not present

## 2022-02-24 DIAGNOSIS — R2689 Other abnormalities of gait and mobility: Secondary | ICD-10-CM | POA: Diagnosis not present

## 2022-02-28 DIAGNOSIS — M25571 Pain in right ankle and joints of right foot: Secondary | ICD-10-CM | POA: Diagnosis not present

## 2022-02-28 DIAGNOSIS — R2689 Other abnormalities of gait and mobility: Secondary | ICD-10-CM | POA: Diagnosis not present

## 2022-03-07 ENCOUNTER — Ambulatory Visit: Payer: Medicare HMO | Admitting: Podiatry

## 2022-03-07 ENCOUNTER — Encounter: Payer: Self-pay | Admitting: Podiatry

## 2022-03-07 DIAGNOSIS — S86011A Strain of right Achilles tendon, initial encounter: Secondary | ICD-10-CM

## 2022-03-09 ENCOUNTER — Ambulatory Visit: Payer: Medicare HMO | Admitting: Podiatry

## 2022-03-16 ENCOUNTER — Ambulatory Visit: Payer: Medicare HMO | Admitting: Podiatry

## 2022-03-17 ENCOUNTER — Encounter: Payer: Self-pay | Admitting: Gastroenterology

## 2022-03-17 ENCOUNTER — Ambulatory Visit: Payer: Medicare HMO | Admitting: Gastroenterology

## 2022-03-17 ENCOUNTER — Other Ambulatory Visit: Payer: Self-pay

## 2022-03-17 VITALS — BP 158/54 | HR 84 | Temp 98.2°F | Wt 172.2 lb

## 2022-03-17 DIAGNOSIS — K5900 Constipation, unspecified: Secondary | ICD-10-CM | POA: Diagnosis not present

## 2022-03-17 DIAGNOSIS — K143 Hypertrophy of tongue papillae: Secondary | ICD-10-CM | POA: Insufficient documentation

## 2022-03-17 DIAGNOSIS — I2510 Atherosclerotic heart disease of native coronary artery without angina pectoris: Secondary | ICD-10-CM | POA: Insufficient documentation

## 2022-03-17 NOTE — Patient Instructions (Signed)
Polyethylene Glycol Powder for Solution What is this medication? POLYETHYLENE GLYCOL (pol ee ETH i leen; GLYE col) prevents and treats occasional constipation. It works by increasing the amount of water your intestine absorbs. This softens the stool, making it easier to have a bowel movement. It also increases pressure, which prompts the muscles in your intestines to move stool. It belongs to a group of medications called laxatives. This medicine may be used for other purposes; ask your health care provider or pharmacist if you have questions. COMMON BRAND NAME(S): GaviLax, GIALAX, GlycoLax, Healthylax, MiraLax, Smooth LAX, Vita Health What should I tell my care team before I take this medication? They need to know if you have any of these conditions: History of blockage in your bowels Nausea Phenylketonuria Stomach or intestine problem Stomach pain Sudden change in bowel habit lasting more than 2 weeks Vomiting An unusual or allergic reaction to polyethylene glycol (PEG), other medications, foods, dyes, or preservatives Pregnant or trying to get pregnant Breast-feeding How should I use this medication? Take this medication by mouth. Take it as directed on the label. Add the right dose to 4 to 8 ounces or 120 to 240 mL of water, juice, soda, coffee or tea. Do not mix this medication with foods or other liquids. Do not combine with starch-based thickeners (e.g., flour, cornstarch, arrowroot, tapioca, xanthan gum). Mix well. Drink the solution. Do not use it more often than directed. Talk to your care team about the use of this medication in children. While it may be given to children as young as 16 years for selected conditions, precautions do apply. Overdosage: If you think you have taken too much of this medicine contact a poison control center or emergency room at once. NOTE: This medicine is only for you. Do not share this medicine with others. What if I miss a dose? If you miss a dose,  take it as soon as you can. If it is almost time for your next dose, take only that dose. Do not take double or extra doses. What may interact with this medication? Interactions are not expected. This list may not describe all possible interactions. Give your health care provider a list of all the medicines, herbs, non-prescription drugs, or dietary supplements you use. Also tell them if you smoke, drink alcohol, or use illegal drugs. Some items may interact with your medicine. What should I watch for while using this medication? Do not use for more than one week without advice from your care team. If your constipation returns, check with your care team. Drink plenty of water while taking this medication. Drinking water helps decrease constipation. Stop using this medication and contact your care team if you experience any rectal bleeding or do not have a bowel movement after use. These could be signs of a more serious condition. What side effects may I notice from receiving this medication? Side effects that you should report to your care team as soon as possible: Allergic reactions--skin rash, itching, hives, swelling of the face, lips, tongue, or throat Side effects that usually do not require medical attention (report to your care team if they continue or are bothersome): Bloating Gas Nausea Stomach cramping This list may not describe all possible side effects. Call your doctor for medical advice about side effects. You may report side effects to FDA at 1-800-FDA-1088. Where should I keep my medication? Keep out of the reach of children and pets. Store at room temperature between 20 and 25 degrees C (68  and 77 degrees F). Get rid of any unused medication after the expiration date. To get rid of medications that are no longer needed or have expired: Take the medication to a medication take-back program. Check with your pharmacy or law enforcement to find a location. If you cannot return the  medication, check the label or package insert to see if the medication should be thrown out in the garbage or flushed down the toilet. If you are not sure, ask your care team. If it is safe to put it in the trash, pour the medication out of the container. Mix the medication with cat litter, dirt, coffee grounds, or other unwanted substance. Seal the mixture in a bag or container. Put it in the trash. NOTE: This sheet is a summary. It may not cover all possible information. If you have questions about this medicine, talk to your doctor, pharmacist, or health care provider.  2023 Elsevier/Gold Standard (2021-07-15 00:00:00)   Please buy [place] to help you. This is over-the-counter.

## 2022-03-17 NOTE — Progress Notes (Signed)
Tracy Bellows MD, MRCP(U.K) [address]  [place]  [place], [place] [address]  Main: [phone]  Fax: [phone]   Primary Care Physician: Steele Sizer, MD  Primary Gastroenterologist:  Dr. Jonathon [name]   Chief Complaint  Patient presents with   Constipation    HPI: Tracy Meas. is a 88 y.o. male  Summary of history :   Initially referred and seen on 11/10/2021 for change in bowel habits.  History of suggestive of constipation.  Commenced on MiraLAX 1 capful twice daily.  11/17/2021: Colonoscopy: 15 mm polyp resected in the proximal ascending colon via EMR.  Pathology report demonstrated it was a tubular adenoma.   States that he has been taking MiraLAX and in addition taking senna and yet struggles in the morning to have a soft bowel movement he feels like a lot of pressure when he has a bowel movement and feels like the stool "is swiggly"   Interval history  01/25/2022-03/17/2022   He was started initially on Linzess 145 mcg/day samples.  It caused him to have diarrhea reduce the dose to 72 mcg did not work well enough.  Later decided to stop it as it was not covered by the insurance went on to docusate not having adequate bowel movements although better than what it used to be.  Also has some burning sensation over the perianal area associated with bowel movements      Current Outpatient Medications  Medication Sig Dispense Refill   Accu-Chek FastClix Lancets MISC CHECK  BLOOD  SUGAR TWICE DAILY 204 each 1   allopurinol (ZYLOPRIM) 100 MG tablet Take 200 mg by mouth daily.     aspirin 81 MG tablet Take 81 mg by mouth.     atorvastatin (LIPITOR) 10 MG tablet TAKE 1 TABLET DAILY AT 6 PM. 90 tablet 1   Blood Glucose Monitoring Suppl (ACCU-CHEK NANO SMARTVIEW) W/DEVICE KIT USE AS DIRECTED 1 kit 0   Cholecalciferol (VITAMIN D) 2000 units tablet Take 2,000 Units by mouth daily.     cyanocobalamin (,VITAMIN B-12,) 1000 MCG/ML injection Inject 1,000 mcg as directed  every 30 (thirty) days.     dorzolamide (TRUSOPT) 2 % ophthalmic solution Place 1 drop into both eyes daily.     doxazosin (CARDURA) 4 MG tablet Take 1 tablet (4 mg total) by mouth at bedtime. 90 tablet 3   DROPLET PEN NEEDLES 31G X 5 MM MISC USE AS NEEDED. 100 each 3   finasteride (PROSCAR) 5 MG tablet Take 1 tablet (5 mg total) by mouth at bedtime. 90 tablet 3   fluticasone (FLONASE) 50 MCG/ACT nasal spray 2 sprays daily.     glipiZIDE (GLUCOTROL) 10 MG tablet Take 1 tablet by mouth daily.     glucose blood (COOL BLOOD GLUCOSE TEST STRIPS) test strip Use as instructed 100 each 12   insulin glargine (LANTUS SOLOSTAR) 100 UNIT/ML Solostar Pen Inject 46 Units into the skin daily. 30 mL 0   JARDIANCE 10 MG TABS tablet TAKE 1 TABLET EVERY DAY 90 tablet 1   linaclotide (LINZESS) 145 MCG CAPS capsule Take 1 capsule (145 mcg total) by mouth daily before breakfast. 30 capsule 11   lisinopril (ZESTRIL) 40 MG tablet Take 1 tablet by mouth daily.     meloxicam (MOBIC) 7.5 MG tablet Take 1 tablet by mouth daily.     metFORMIN (GLUCOPHAGE) 1000 MG tablet Take 1 tablet by mouth daily.     Multiple Vitamins-Minerals (CENTRUM SILVER 50+MEN) TABS Take 1  tablet by mouth daily.     omega-3 acid ethyl esters (LOVAZA) 1 g capsule Take 2 capsules by mouth in the morning and at bedtime.     omeprazole (PRILOSEC) 40 MG capsule TAKE 1 CAPSULE EVERY DAY 90 capsule 1   prednisoLONE acetate (PRED FORTE) 1 % ophthalmic suspension Place into the right eye.     No current facility-administered medications for this visit.    Allergies as of 03/17/2022 - Review Complete 03/07/2022  Allergen Reaction Noted   Tetracyclines & related Other (See Comments) 01/14/2013    ROS:  General: Negative for anorexia, weight loss, fever, chills, fatigue, weakness. ENT: Negative for hoarseness, difficulty swallowing , nasal congestion. CV: Negative for chest pain, angina, palpitations, dyspnea on exertion, peripheral edema.   Respiratory: Negative for dyspnea at rest, dyspnea on exertion, cough, sputum, wheezing.  GI: See history of present illness. GU:  Negative for dysuria, hematuria, urinary incontinence, urinary frequency, nocturnal urination.  Endo: Negative for unusual weight change.    Physical Examination:   BP (!) 158/54   Pulse 84   Temp 98.2 F (36.8 C) (Oral)   Wt 172 lb 3.2 oz (78.1 kg)   BMI 24.71 kg/m   General: Well-nourished, well-developed in no acute distress.  Eyes: No icterus. Conjunctivae pink. Mouth: Oropharyngeal mucosa moist and pink , no lesions erythema or exudate. Neuro: Alert and oriented x 3.  Grossly intact. Skin: Warm and dry, no jaundice.   Psych: Alert and cooperative, normal mood and affect.   Imaging Studies: No results found.  Assessment and Plan:   Tracy Almeda. is a 88 y.o. y/o male  here to follow-up for constipation.  Commenced on MiraLAX 1 capful twice daily.  Colonoscopy showed no abnormality except a 15 mm tubular adenoma that was resected.  Linzess 145 mcg caused him to have diarrhea lower dose did not work well.  Cost was also prohibitive.  On docusate which is not working well enough advised to add MiraLAX 1-2 times a day.  In terms of perianal itching likely related to the constipation advised to use Desitin topical in the perianal area, sitz bath at night, avoid excess aggressive cleaning after bowel movement clean with 1 or 2 wipes nonmedicated soft or use a bidet and wipe gently afterwards.  If the constipation persists will try lactulose at next visit.  Advised to update me via MyChart in 1 week's time  Dr Tracy Bellows  MD,MRCP [facility]) Follow up in as needed

## 2022-03-23 ENCOUNTER — Ambulatory Visit: Payer: Medicare HMO | Admitting: Family Medicine

## 2022-03-28 ENCOUNTER — Telehealth: Payer: Self-pay | Admitting: Podiatry

## 2022-03-28 DIAGNOSIS — H903 Sensorineural hearing loss, bilateral: Secondary | ICD-10-CM | POA: Diagnosis not present

## 2022-03-28 DIAGNOSIS — H6123 Impacted cerumen, bilateral: Secondary | ICD-10-CM | POA: Diagnosis not present

## 2022-03-28 NOTE — Telephone Encounter (Signed)
Patient called his is still having a lot of pain in achilles tendon,  he has worn the boot like you told him to, done the physical  therapy and the pain is just a bad as it was.   Should he be resting it more, should he not be doing exercises , please advise, patient would like call back . He is set for an appointment for Monday 04/01/22 915a

## 2022-03-28 NOTE — Progress Notes (Unsigned)
Name: Tracy [name].   MRN: [id number]    DOB: [birth year]   Date:03/29/2022       Progress Note  Subjective  Chief Complaint  Follow Up  HPI  GERD: we switched from Pepcid to PPI back in March 2021 , he was skipping doses but is back on daily therapy, he will stop nsaid's and try to cut down on medication use   Partial tear of achilles tendon: seen by Dr. Milinda Pointer and followed by Dr. Sherryle Lis and was told not a rupture but tendinitis. 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 He has been doing well since, denies chest pain, SOB or orthopnea. Denies decrease in exercise tolerance. He has not been able to walk daily due to achilles tendinitis, he is going to the gym to use elliptical and bike   Change in bowel movements: he was seen by Dr. Vicente Males, had colonoscopy, he is now taking otc Clear Lax once a day and bowel movements back to normal and is doing well, with bowel movements once a day and smooth, bristol 4   DMII: he sees Dr. Kenton Kingfisher at [facility] usually once a year, taking Lantus 46 units and Jardiance, A1C see has been controlled, today is 6.7 %. He has dyslipidemia but LDL is at goal, CKI stage III and is taking Jardiance and ACE   Hyperlipidemia: taking medication and denies side effects, LDL is at goal. No myalgia. Denies recent cramps We will recheck it next visit   HTN: under control with medication, no chest pain or palpitation, no dizziness.Continue medications    Hearing loss sudden, happened after an URI back in Dec 2020  seen by Dr. Tami Ribas and had steroid and is using a hearing aid - but did not wear it today because it is bothersome in a smaller room . Unchanged    Osteoarthritis: he has OA of left thumb, intermittent medial knee pain when moving/walking but responding to topical medication Taking  Tylenol prn Advised to stop NSAID's   Gout: under control with allopurinol 300 mg daily , stable and no side effects.    Senile purpura: on arms stable , reassurance given again    History of skin cancer: he is compliant with dermatologist visits Had another basal cell removed from his nose, he states it is healing    BPH: sees Urologist, no nocturia, symptoms controlled. He is happy with medications - takes Cardura daily . He is 88 and we will not check PSA. Keep follow up with Urologist Unchanged   Patient Active Problem List   Diagnosis Date Noted   Coronary artery disease 03/17/2022   Hypertrophy foliate papillae 03/17/2022   Stage 3a chronic kidney disease ([place]) 09/22/2021   Numbness and tingling of both feet 10/23/2020   Chronic ischemic heart disease 10/19/2020   Hypertension associated with stage 3a chronic kidney disease due to type 2 diabetes mellitus ([place]) 10/19/2020   Anemia of chronic disease 11/12/2019   Hearing loss on right 11/26/2018   Labyrinthitis of right ear 10/25/2018   Spinal stenosis of lumbar region 09/13/2017   Spinal stenosis of lumbar region with neurogenic claudication 05/03/2017   Type 2 diabetes mellitus without complication, with long-term current use of insulin ([place]) 05/03/2017   Left hip pain 07/28/2016   ALT (SGPT) level raised  07/13/2015   At risk for falling 07/13/2015   Arthritis of hand, degenerative 07/13/2015   Gout 07/13/2015   Glaucoma 07/13/2015   Diabetic neuropathy ([place]) 07/13/2015   Hypertension 04/09/2015   Type 2 diabetes mellitus with microalbuminuria, with long-term current use of insulin ([place]) 04/09/2015   Dyslipidemia 04/09/2015   Hypertriglyceridemia 04/09/2015   Benign fibroma of prostate 04/09/2015   Acid reflux 04/09/2015   History of melanoma excision 04/09/2015   History of squamous cell carcinoma 05/23/2013   H/O adenomatous polyp of colon 08/25/2010   History of MI (myocardial infarction) 08/27/1979    Past  Surgical History:  Procedure Laterality Date   CATARACT EXTRACTION W/ INTRAOCULAR LENS  IMPLANT, BILATERAL     CIRCUMCISION     COLONOSCOPY WITH PROPOFOL N/A 11/17/2021   Procedure: COLONOSCOPY WITH PROPOFOL;  Surgeon: Jonathon Bellows, MD;  Location: [facility] ENDOSCOPY;  Service: Gastroenterology;  Laterality: N/A;   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/10/2019   GANGLION CYST EXCISION     LEFT HAND   LUMBAR LAMINECTOMY/DECOMPRESSION MICRODISCECTOMY  09/13/2017   Procedure: BILATERAL Lumbar three-four , Left  Lumbar four-five  AND Left  L5-S1, LAMINECTOMY/FORAMINOTOMY;  Surgeon: [name], MD;  Location: [place];  Service: Neurosurgery;;   MELANOMA EXCISION  07/2011   TONSILLECTOMY     VASECTOMY      Family History  Problem Relation Age of Onset   Heart disease Mother    Diabetes Mother        type 2   Angina Mother    Heart disease Brother    Pneumonia Father    Heart attack Father    Alcoholism Daughter    Cirrhosis Daughter     Social History   Tobacco Use   Smoking status: Former    Packs/day: 2.00    Years: 13.00    Total pack years: 26.00    Types: Cigarettes    Quit date: 1963    Years since quitting: 60.5   Smokeless tobacco: Never   Tobacco comments:    smoking cessation materials not required  Substance Use Topics   Alcohol use: Yes    Alcohol/week: 2.0 standard drinks of alcohol    Types: 2 Shots of liquor per week    Comment: occasional     Current Outpatient Medications:    Accu-Chek FastClix Lancets MISC, CHECK  BLOOD  SUGAR TWICE DAILY, Disp: 204 each, Rfl: 1   allopurinol (ZYLOPRIM) 100 MG tablet, Take 200 mg by mouth daily., Disp: , Rfl:    aspirin 81 MG tablet, Take 81 mg by mouth., Disp: , Rfl:    atorvastatin (LIPITOR) 10 MG tablet, TAKE 1 TABLET DAILY AT 6 PM., Disp: 90 tablet, Rfl: 1   Blood Glucose Monitoring Suppl (ACCU-CHEK NANO SMARTVIEW) W/DEVICE KIT, USE AS DIRECTED, Disp: 1 kit, Rfl: 0   Cholecalciferol (VITAMIN D) 2000 units tablet, Take 2,000 Units by  mouth daily., Disp: , Rfl:    cyanocobalamin (,VITAMIN B-12,) 1000 MCG/ML injection, Inject 1,000 mcg as directed every 30 (thirty) days., Disp: , Rfl:    dorzolamide (TRUSOPT) 2 % ophthalmic solution, Place 1 drop into the left eye daily., Disp: , Rfl:    doxazosin (CARDURA) 4 MG tablet, Take 1 tablet (4 mg total) by mouth at bedtime., Disp: 90 tablet, Rfl: 3   DROPLET PEN NEEDLES 31G X 5 MM MISC, USE AS NEEDED., Disp: 100 each, Rfl: 3   finasteride (PROSCAR) 5 MG tablet, Take 1 tablet (5 mg total) by mouth at bedtime.,  Disp: 90 tablet, Rfl: 3   fluticasone (FLONASE) 50 MCG/ACT nasal spray, 2 sprays daily., Disp: , Rfl:    glipiZIDE (GLUCOTROL) 10 MG tablet, Take 1 tablet by mouth daily., Disp: , Rfl:    glucose blood (COOL BLOOD GLUCOSE TEST STRIPS) test strip, Use as instructed, Disp: 100 each, Rfl: 12   insulin glargine (LANTUS SOLOSTAR) 100 UNIT/ML Solostar Pen, Inject 46 Units into the skin daily., Disp: 30 mL, Rfl: 0   JARDIANCE 10 MG TABS tablet, TAKE 1 TABLET EVERY DAY, Disp: 90 tablet, Rfl: 1   linaclotide (LINZESS) 145 MCG CAPS capsule, Take 1 capsule (145 mcg total) by mouth daily before breakfast., Disp: 30 capsule, Rfl: 11   lisinopril (ZESTRIL) 40 MG tablet, Take 1 tablet by mouth daily., Disp: , Rfl:    meloxicam (MOBIC) 7.5 MG tablet, Take 1 tablet by mouth daily., Disp: , Rfl:    Multiple Vitamins-Minerals (CENTRUM SILVER 50+MEN) TABS, Take 1 tablet by mouth daily., Disp: , Rfl:    omega-3 acid ethyl esters (LOVAZA) 1 g capsule, Take 2 capsules by mouth in the morning and at bedtime., Disp: , Rfl:    omeprazole (PRILOSEC) 40 MG capsule, TAKE 1 CAPSULE EVERY DAY, Disp: 90 capsule, Rfl: 1   prednisoLONE acetate (PRED FORTE) 1 % ophthalmic suspension, Place into the right eye., Disp: , Rfl:    metFORMIN (GLUCOPHAGE) 1000 MG tablet, Take 1 tablet by mouth daily. (Patient not taking: Reported on 03/29/2022), Disp: , Rfl:   Allergies  Allergen Reactions   Tetracyclines & Related  Other (See Comments)    UNSPECIFIED REACTION  other    I personally reviewed active problem list, medication list, allergies, family history, social history, health maintenance with the patient/caregiver today.   ROS  Constitutional: Negative for fever or weight change.  Respiratory: Negative for cough and shortness of breath.   Cardiovascular: Negative for chest pain or palpitations.  Gastrointestinal: Negative for abdominal pain, no bowel changes.  Musculoskeletal:positive for gait problem but no  joint swelling.  Skin: Negative for rash.  Neurological: Negative for dizziness or headache.  No other specific complaints in a complete review of systems (except as listed in HPI above).   Objective  Vitals:   03/29/22 0916  BP: 136/74  Pulse: 82  Resp: 16  SpO2: 95%  Weight: 171 lb (77.6 kg)  Height: 5' 11"  (1.803 m)    Body mass index is 23.85 kg/m.  Physical Exam  Constitutional: Patient appears well-developed and well-nourished.  No distress.  HEENT: head atraumatic, normocephalic, pupils equal and reactive to light, neck supple Cardiovascular: Normal rate, regular rhythm and normal heart sounds.  No murmur heard. No BLE edema. Pulmonary/Chest: Effort normal and breath sounds normal. No respiratory distress. Abdominal: Soft.  There is no tenderness. Psychiatric: Patient has a normal mood and affect. behavior is normal. Judgment and thought content normal.   Recent Results (from the past 2160 hour(s))  HM DIABETES EYE EXAM     Status: None   Collection Time: 01/06/22 12:00 AM  Result Value Ref Range   HM Diabetic Eye Exam No Retinopathy No Retinopathy  POCT HgB A1C     Status: Abnormal   Collection Time: 03/29/22  9:17 AM  Result Value Ref Range   Hemoglobin A1C 6.7 (A) 4.0 - 5.6 %   HbA1c POC (<> result, manual entry)     HbA1c, POC (prediabetic range)     HbA1c, POC (controlled diabetic range)       PHQ2/9:  03/29/2022    9:16 AM 12/14/2021   10:38 AM  11/15/2021    8:08 AM 09/22/2021    9:28 AM 08/09/2021   11:01 AM  Depression screen PHQ 2/9  Decreased Interest 0 0 0 0 0  Down, Depressed, Hopeless 0 0 0 0 0  PHQ - 2 Score 0 0 0 0 0  Altered sleeping  0 0 0 0  Tired, decreased energy  0 0 0 0  Change in appetite  0 0 0 0  Feeling bad or failure about yourself   0 0 0 0  Trouble concentrating  0 0 0 0  Moving slowly or fidgety/restless  0 0 0 0  Suicidal thoughts  0 0 0 0  PHQ-9 Score  0 0 0 0  Difficult doing work/chores  Not difficult at all Not difficult at all  Not difficult at all    phq 9 is negative   Fall Risk:    03/29/2022    9:16 AM 12/14/2021   10:40 AM 11/15/2021    8:08 AM 09/22/2021    9:28 AM 08/09/2021   10:58 AM  Fall Risk   Falls in the past year? 0 0 0 0 0  Number falls in past yr: 0 0 0 0 0  Injury with Fall? 0 0 0 0 0  Risk for fall due to : No Fall Risks No Fall Risks  No Fall Risks   Follow up Falls prevention discussed Falls prevention discussed  Falls prevention discussed       Functional Status Survey: Is the patient deaf or have difficulty hearing?: Yes Does the patient have difficulty seeing, even when wearing glasses/contacts?: No Does the patient have difficulty concentrating, remembering, or making decisions?: No Does the patient have difficulty walking or climbing stairs?: No Does the patient have difficulty dressing or bathing?: No Does the patient have difficulty doing errands alone such as visiting a doctor's office or shopping?: No    Assessment & Plan  1. Controlled type 2 diabetes mellitus with microalbuminuria, with long-term current use of insulin (HCC)  - POCT HgB A1C  2. Hypertension associated with stage 3a chronic kidney disease due to type 2 diabetes mellitus (HCC)  - amLODipine-olmesartan (AZOR) 10-40 MG tablet; Take 1 tablet by mouth daily.  Dispense: 90 tablet; Refill: 1  3. Achilles tendinitis of right lower extremity  - diclofenac Sodium (VOLTAREN ARTHRITIS PAIN)  1 % GEL; Apply 4 g topically 4 (four) times daily.  Dispense: 300 g; Refill: 0  4. Senile purpura ([place])   5. Claudication ([place])   6. Controlled gout  - allopurinol (ZYLOPRIM) 300 MG tablet; Take 1 tablet (300 mg total) by mouth daily.  Dispense: 90 tablet; Refill: 1  7. GERD without esophagitis   8. Chronic constipation  - polyethylene glycol powder (GLYCOLAX/MIRALAX) 17 GM/SCOOP powder; Take 17 g by mouth daily.  Dispense: 3350 g; Refill: 1

## 2022-03-29 ENCOUNTER — Ambulatory Visit: Payer: Medicare HMO | Admitting: Family Medicine

## 2022-03-29 ENCOUNTER — Encounter: Payer: Self-pay | Admitting: Family Medicine

## 2022-03-29 VITALS — BP 136/74 | HR 82 | Resp 16 | Ht 71.0 in | Wt 171.0 lb

## 2022-03-29 DIAGNOSIS — M7661 Achilles tendinitis, right leg: Secondary | ICD-10-CM

## 2022-03-29 DIAGNOSIS — D692 Other nonthrombocytopenic purpura: Secondary | ICD-10-CM | POA: Diagnosis not present

## 2022-03-29 DIAGNOSIS — Z794 Long term (current) use of insulin: Secondary | ICD-10-CM | POA: Diagnosis not present

## 2022-03-29 DIAGNOSIS — M109 Gout, unspecified: Secondary | ICD-10-CM | POA: Diagnosis not present

## 2022-03-29 DIAGNOSIS — E1129 Type 2 diabetes mellitus with other diabetic kidney complication: Secondary | ICD-10-CM

## 2022-03-29 DIAGNOSIS — I129 Hypertensive chronic kidney disease with stage 1 through stage 4 chronic kidney disease, or unspecified chronic kidney disease: Secondary | ICD-10-CM

## 2022-03-29 DIAGNOSIS — R809 Proteinuria, unspecified: Secondary | ICD-10-CM

## 2022-03-29 DIAGNOSIS — I739 Peripheral vascular disease, unspecified: Secondary | ICD-10-CM | POA: Diagnosis not present

## 2022-03-29 DIAGNOSIS — E1122 Type 2 diabetes mellitus with diabetic chronic kidney disease: Secondary | ICD-10-CM | POA: Diagnosis not present

## 2022-03-29 DIAGNOSIS — K5909 Other constipation: Secondary | ICD-10-CM

## 2022-03-29 DIAGNOSIS — K219 Gastro-esophageal reflux disease without esophagitis: Secondary | ICD-10-CM

## 2022-03-29 DIAGNOSIS — N1831 Chronic kidney disease, stage 3a: Secondary | ICD-10-CM

## 2022-03-29 LAB — POCT GLYCOSYLATED HEMOGLOBIN (HGB A1C): Hemoglobin A1C: 6.7 % — AB (ref 4.0–5.6)

## 2022-03-29 MED ORDER — AMLODIPINE-OLMESARTAN 10-40 MG PO TABS: 1.0000 | ORAL_TABLET | Freq: Every day | ORAL | 1 refills | Status: DC

## 2022-03-29 MED ORDER — DICLOFENAC SODIUM 1 % EX GEL: 4.0000 g | Freq: Four times a day (QID) | CUTANEOUS | 0 refills | Status: DC

## 2022-03-29 MED ORDER — ALLOPURINOL 300 MG PO TABS: 300.0000 mg | ORAL_TABLET | Freq: Every day | ORAL | 1 refills | Status: DC

## 2022-03-29 MED ORDER — FISH OIL 1000 MG PO CPDR: 1.0000 | DELAYED_RELEASE_CAPSULE | Freq: Two times a day (BID) | ORAL | 0 refills | Status: DC

## 2022-03-29 MED ORDER — POLYETHYLENE GLYCOL 3350 17 GM/SCOOP PO POWD: 17.0000 g | Freq: Every day | ORAL | 1 refills | Status: AC

## 2022-04-04 ENCOUNTER — Encounter: Payer: Self-pay | Admitting: Podiatry

## 2022-04-04 ENCOUNTER — Ambulatory Visit: Payer: Medicare HMO | Admitting: Podiatry

## 2022-04-04 DIAGNOSIS — S86011A Strain of right Achilles tendon, initial encounter: Secondary | ICD-10-CM | POA: Diagnosis not present

## 2022-04-04 NOTE — Progress Notes (Signed)
  Subjective:  Patient ID: [name]., male    DOB: [birth year],  MRN: [id number]  Chief Complaint  Patient presents with   Foot Pain    "It's better but not quite right yet."    88 y.o. male presents with the above complaint. History confirmed with patient.  He returns for follow-up today having little to no pain doing much better he presents in regular shoes today.  Says the boot is falling out he no longer has the heel lifts.  Interval History: He returns for follow-up today the Achilles is still somewhat sore he feels a gets very tight and he cannot quite walk as normal as he used to.  He started to go back to his regular activity and it started to get sore and tight again and he felt like he was going to injure or damage it so he stopped and rested and it feels better now  Objective:  Physical Exam: warm, good capillary refill, no trophic changes or ulcerative lesions, normal DP and PT pulses, and normal sensory exam. Left Foot: normal exam, no swelling, tenderness, instability; ligaments intact, full range of motion of all ankle/foot joints Right Foot: He has no pain in mid substance of Achilles today, good plantarflexion strength 5 out of 5. Some area of fullness noted   MRI dated 12/31/2021 shows a partial-thickness midsubstance tear with moderate tendinosis there is no tendon gap or retraction and peritendinous fluid Assessment:   1. Partial rupture of Achilles tendon, right, initial encounter      Plan:  Patient was evaluated and treated and all questions answered.  From a functional standpoint still doing very well he has minimal tenderness and good strength.  Physical therapy was helpful, he tried to go back to his regular activity before his rupture and it started to get sore again.  I discussed with him this may be a process where he has to gradually increase his activity level from a minimum to his regular walking distance.  He used to walk about 30 minutes a day  for exercise I recommend he begin with 10 minutes a day and then work his way up to 2:20 weeks and then 2:30 weeks.  Discussed he may need to alternate days that he walks as well to get better resting in between walking.  He will follow-up with me as needed.  Return if symptoms worsen or fail to improve.

## 2022-04-12 ENCOUNTER — Telehealth: Payer: Self-pay | Admitting: Podiatry

## 2022-04-12 NOTE — Telephone Encounter (Signed)
Patient called he would like to speak with Dr Sherryle Lis he does not feel his condition is getting better, he is feeling worse.  He can barely walk, he cant do the exercises.  Please call

## 2022-05-09 DIAGNOSIS — D2261 Melanocytic nevi of right upper limb, including shoulder: Secondary | ICD-10-CM | POA: Diagnosis not present

## 2022-05-09 DIAGNOSIS — L570 Actinic keratosis: Secondary | ICD-10-CM | POA: Diagnosis not present

## 2022-05-09 DIAGNOSIS — X32XXXA Exposure to sunlight, initial encounter: Secondary | ICD-10-CM | POA: Diagnosis not present

## 2022-05-09 DIAGNOSIS — Z85820 Personal history of malignant melanoma of skin: Secondary | ICD-10-CM | POA: Diagnosis not present

## 2022-05-09 DIAGNOSIS — D2271 Melanocytic nevi of right lower limb, including hip: Secondary | ICD-10-CM | POA: Diagnosis not present

## 2022-05-09 DIAGNOSIS — Z85828 Personal history of other malignant neoplasm of skin: Secondary | ICD-10-CM | POA: Diagnosis not present

## 2022-05-10 ENCOUNTER — Ambulatory Visit: Payer: Medicare HMO | Admitting: Podiatry

## 2022-05-10 DIAGNOSIS — M7661 Achilles tendinitis, right leg: Secondary | ICD-10-CM | POA: Diagnosis not present

## 2022-05-10 DIAGNOSIS — S86011A Strain of right Achilles tendon, initial encounter: Secondary | ICD-10-CM

## 2022-05-10 NOTE — Progress Notes (Signed)
  Subjective:  Patient ID: Tracy Favors., male    DOB: 1929/11/24,  MRN: [id number]  Chief Complaint  Patient presents with   Foot Pain    Right foot achilles tendon pain Has been in 2 different boot and has tried physical therapy still having pain and discomfort     88 y.o. male presents with the above complaint. History confirmed with patient.  He returns today to see me.  He was treated by Dr. Lynnell Catalan and Dr. Milinda Pointer.  He states is still painful has not gotten better physical therapy is making it worse.  He would like to discuss next treatment plan.   Objective:  Physical Exam: warm, good capillary refill, no trophic changes or ulcerative lesions, normal DP and PT pulses, and normal sensory exam. Left Foot: normal exam, no swelling, tenderness, instability; ligaments intact, full range of motion of all ankle/foot joints Right Foot: He has no pain in mid substance of Achilles today, good plantarflexion strength 5 out of 5. Some area of fullness noted.  Positive Silfverskiold test with gastrocnemius equinus   MRI dated 12/31/2021 shows a partial-thickness midsubstance tear with moderate tendinosis there is no tendon gap or retraction and peritendinous fluid Assessment:   1. Partial rupture of Achilles tendon, right, initial encounter   2. Achilles tendinitis of right lower extremity       Plan:  Patient was evaluated and treated and all questions answered.  Right Achilles tendinosis -All questions and concerns were discussed with the patient in extensive detail -Patient has failed physical therapy cam boot immobilization injection multiple different conservative care at this time I discussed PRP injection to see if it gives him some relief.  We would like to still continue focusing on conservative care due to his patient is a high risk of complications including wound dehiscence.  I discussed with patient he states understanding. -Ultimately if there is no improvement patient  will need surgical intervention. -He will be scheduled for PRP injection  No follow-ups on file.

## 2022-05-11 ENCOUNTER — Ambulatory Visit (INDEPENDENT_AMBULATORY_CARE_PROVIDER_SITE_OTHER): Payer: Medicare HMO | Admitting: Podiatry

## 2022-05-11 DIAGNOSIS — S86011A Strain of right Achilles tendon, initial encounter: Secondary | ICD-10-CM

## 2022-05-11 DIAGNOSIS — M7661 Achilles tendinitis, right leg: Secondary | ICD-10-CM

## 2022-05-11 NOTE — Progress Notes (Signed)
PRP injection was given to the right Achilles tendon in standard technique 6 cc of PRP was delivered.  No complication noted.

## 2022-05-26 ENCOUNTER — Ambulatory Visit: Payer: Medicare HMO | Admitting: Podiatry

## 2022-05-26 DIAGNOSIS — E1129 Type 2 diabetes mellitus with other diabetic kidney complication: Secondary | ICD-10-CM

## 2022-05-26 DIAGNOSIS — M21861 Other specified acquired deformities of right lower leg: Secondary | ICD-10-CM

## 2022-05-26 DIAGNOSIS — M7661 Achilles tendinitis, right leg: Secondary | ICD-10-CM | POA: Diagnosis not present

## 2022-05-26 DIAGNOSIS — Z01818 Encounter for other preprocedural examination: Secondary | ICD-10-CM

## 2022-05-26 DIAGNOSIS — Z794 Long term (current) use of insulin: Secondary | ICD-10-CM

## 2022-05-26 DIAGNOSIS — R809 Proteinuria, unspecified: Secondary | ICD-10-CM

## 2022-05-30 DIAGNOSIS — H401132 Primary open-angle glaucoma, bilateral, moderate stage: Secondary | ICD-10-CM | POA: Diagnosis not present

## 2022-06-07 NOTE — Progress Notes (Signed)
  Subjective:  Patient ID: Tracy Favors., male    DOB: 04-Jan-1930,  MRN: [id number]  Chief Complaint  Patient presents with   Foot Pain    88 y.o. male presents with the above complaint. History confirmed with patient.  He returns today to see me.  He states he is doing okay still having some pain the injection PRP did not help much.  He would like to discuss surgical plans at this point   Objective:  Physical Exam: warm, good capillary refill, no trophic changes or ulcerative lesions, normal DP and PT pulses, and normal sensory exam. Left Foot: normal exam, no swelling, tenderness, instability; ligaments intact, full range of motion of all ankle/foot joints Right Foot: He has no pain in mid substance of Achilles today, good plantarflexion strength 5 out of 5. Some area of fullness noted.  Positive Silfverskiold test with gastrocnemius equinus   MRI dated 12/31/2021 shows a partial-thickness midsubstance tear with moderate tendinosis there is no tendon gap or retraction and peritendinous fluid Assessment:   1. Gastrocnemius equinus, right   2. Achilles tendinitis of right lower extremity   3. Type 2 diabetes mellitus with microalbuminuria, with long-term current use of insulin ([place])   4. Encounter for preoperative examination for general surgical procedure        Plan:  Patient was evaluated and treated and all questions answered.  Right Achilles tendinosis -All questions and concerns were discussed with the patient in extensive detail -Clinically he has failed all conservative treatment options including PRP injections.  At this time I discussed with the patient that he will benefit from gastrocnemius recession to lengthen the Achilles tendon to take the pressure off of the watershed area of the Achilles.  Clinically patient does not have insertional pain.  I discussed with the patient in extensive detail he states understanding would like to proceed with surgery. -I  discussed my preoperative intraoperative po stop plan in extensive detail he states understanding.  He can be weightbearing as tolerated after the surgery -However patient also does not want to go on lengthier procedure and more recovery as he is not able to be nonweightbearing to the right side.  Therefore he opted for lengthening of the procedure as opposed to repairing of the partial thickness midsubstance tear. -He is a diabetic with last A1c of 6.7% however given that it is controlled I feel comfortable proceeding with the surgery but I did discuss given his age she has a high chance of wound complication as well.  He states understand like to proceed with surgery despite the risks No follow-ups on file.

## 2022-06-08 ENCOUNTER — Telehealth: Payer: Self-pay | Admitting: Urology

## 2022-06-08 NOTE — Telephone Encounter (Signed)
DOS - 07/11/22  The following codes do not require a pre-authorization Created on 06/08/2022  Plan Year 09/12/2021 - 09/11/9998   Service info [id number] Gastrocnemius recession (eg, Strayer procedure

## 2022-06-10 ENCOUNTER — Ambulatory Visit: Payer: Medicare HMO | Admitting: Podiatry

## 2022-06-15 ENCOUNTER — Ambulatory Visit: Payer: Medicare HMO | Admitting: Podiatry

## 2022-06-23 ENCOUNTER — Telehealth: Payer: Self-pay | Admitting: *Deleted

## 2022-06-23 NOTE — Telephone Encounter (Signed)
"  I'm scheduled for surgery with Dr. Posey Pronto on 07/11/2022.  I want to cancel it.  My foot is doing better.  It doesn't hurt anymore.  If it starts to bother me again, I will call and let him know."

## 2022-07-11 ENCOUNTER — Encounter (INDEPENDENT_AMBULATORY_CARE_PROVIDER_SITE_OTHER): Payer: Self-pay

## 2022-07-19 ENCOUNTER — Encounter: Payer: Medicare HMO | Admitting: Podiatry

## 2022-07-26 DIAGNOSIS — H6123 Impacted cerumen, bilateral: Secondary | ICD-10-CM | POA: Diagnosis not present

## 2022-07-26 DIAGNOSIS — H903 Sensorineural hearing loss, bilateral: Secondary | ICD-10-CM | POA: Diagnosis not present

## 2022-07-31 ENCOUNTER — Other Ambulatory Visit: Payer: Self-pay | Admitting: Family Medicine

## 2022-07-31 DIAGNOSIS — R339 Retention of urine, unspecified: Secondary | ICD-10-CM

## 2022-08-01 ENCOUNTER — Other Ambulatory Visit: Payer: Self-pay | Admitting: Family Medicine

## 2022-08-01 DIAGNOSIS — M7661 Achilles tendinitis, right leg: Secondary | ICD-10-CM

## 2022-08-02 ENCOUNTER — Encounter: Payer: Medicare HMO | Admitting: Podiatry

## 2022-08-10 ENCOUNTER — Other Ambulatory Visit: Payer: Self-pay | Admitting: Family Medicine

## 2022-08-10 DIAGNOSIS — R339 Retention of urine, unspecified: Secondary | ICD-10-CM

## 2022-08-22 ENCOUNTER — Encounter: Payer: Self-pay | Admitting: Family Medicine

## 2022-08-22 ENCOUNTER — Ambulatory Visit
Admission: RE | Admit: 2022-08-22 | Discharge: 2022-08-22 | Disposition: A | Discharge status: Home / Self Care | Payer: Medicare HMO | Source: Ambulatory Visit | Attending: Family Medicine | Admitting: Family Medicine

## 2022-08-22 ENCOUNTER — Ambulatory Visit (INDEPENDENT_AMBULATORY_CARE_PROVIDER_SITE_OTHER): Payer: Medicare HMO | Admitting: Family Medicine

## 2022-08-22 ENCOUNTER — Ambulatory Visit: Payer: Self-pay

## 2022-08-22 ENCOUNTER — Ambulatory Visit
Admission: RE | Admit: 2022-08-22 | Discharge: 2022-08-22 | Disposition: A | Discharge status: Home / Self Care | Payer: Medicare HMO | Attending: Family Medicine | Admitting: Family Medicine

## 2022-08-22 VITALS — BP 122/78 | HR 76 | Temp 97.7°F | Resp 18 | Ht 71.0 in | Wt 172.2 lb

## 2022-08-22 DIAGNOSIS — R0789 Other chest pain: Secondary | ICD-10-CM | POA: Insufficient documentation

## 2022-08-22 DIAGNOSIS — S60212A Contusion of left wrist, initial encounter: Secondary | ICD-10-CM

## 2022-08-22 DIAGNOSIS — Z043 Encounter for examination and observation following other accident: Secondary | ICD-10-CM | POA: Diagnosis not present

## 2022-08-22 DIAGNOSIS — W19XXXA Unspecified fall, initial encounter: Secondary | ICD-10-CM | POA: Diagnosis not present

## 2022-08-22 DIAGNOSIS — M19032 Primary osteoarthritis, left wrist: Secondary | ICD-10-CM | POA: Diagnosis not present

## 2022-08-22 MED ORDER — HYDROCODONE-ACETAMINOPHEN 10-325 MG PO TABS: 0.5000 | ORAL_TABLET | Freq: Three times a day (TID) | ORAL | 0 refills | Status: DC | PRN

## 2022-08-22 NOTE — Telephone Encounter (Signed)
Answer Assessment - Initial Assessment Questions 1. MECHANISM: "How did the fall happen?"     Getting out of car and foot tripped over 2. DOMESTIC VIOLENCE AND ELDER ABUSE SCREENING: "Did you fall because someone pushed you or tried to hurt you?" If Yes, ask: "Are you safe now?"     *No Answer* 3. ONSET: "When did the fall happen?" (e.g., minutes, hours, or days ago)     08/17/22 4. LOCATION: "What part of the body hit the ground?" (e.g., back, buttocks, head, hips, knees, hands, head, stomach)     Left side and both hands , bioh knee 5. INJURY: "Did you hurt (injure) yourself when you fell?" If Yes, ask: "What did you injure? Tell me more about this?" (e.g., body area; type of injury; pain severity)"     *No Answer* 6. PAIN: "Is there any pain?" If Yes, ask: "How bad is the pain?" (e.g., Scale 1-10; or mild,  moderate, severe)   - NONE (0): No pain   - MILD (1-3): Doesn't interfere with normal activities    - MODERATE (4-7): Interferes with normal activities or awakens from sleep    - SEVERE (8-10): Excruciating pain, unable to do any normal activities      severe 7. SIZE: For cuts, bruises, or swelling, ask: "How large is it?" (e.g., inches or centimeters)     Entire left side pain is severe 8. PREGNANCY: "Is there any chance you are pregnant?" "When was your last menstrual period?"     N/a 9. OTHER SYMPTOMS: "Do you have any other symptoms?" (e.g., dizziness, fever, weakness; new onset or worsening).    nose, hands sore, right side of chest is blue feels like a sprain, can't lie down, back pain ,  10. CAUSE: "What do you think caused the fall (or falling)?" (e.g., tripped, dizzy spell)       no  Protocols used: Falls and [facility]

## 2022-08-22 NOTE — Telephone Encounter (Signed)
Due to EPIC being down, wiped out documentation. Pt was getting out of car last Wednesday and tripped on curb. He used his hands and knee to brace the fall and fell on left side. Pt sustained a "skinned up" nose and hands and knees. Pt called because of severe pain and brusing to left side of chest with pain located under the left axilla. Pt denies SOB or breathing difficulties. Pt satated he slept 1 hour last night because he could not get comfortable. Pt described the pain as a muscle strain and stated he thinks his back may be injured as well. Appt made woth Dr Ancil Boozer this am at 1020.

## 2022-08-22 NOTE — Progress Notes (Signed)
Name: Tracy [name].   MRN: [id number]    DOB: [birth year]   Date:08/22/2022       Progress Note  Subjective  Chief Complaint  Recent Fall  HPI  Fall: patient states he parked and stepped up the sidewalk to go home and his right foot hit the curb and he fell forward on 08/17/2022. He did not lose consciousness and was able to get up on his own. He hit his nose and scrapped both hands and knees, after a few days his pain has intensified and has a large bruise on left flank. He also has a bruise on left wrist. The pain is affecting his ability to sleep at night. He has never fallen before. Discussed increase risk of falls after the first fall. He goes to the gym and discussed some balance exercises Explained if rib is broken it will take 6-12 weeks to heal.    Patient Active Problem List   Diagnosis Date Noted   Coronary artery disease 03/17/2022   Hypertrophy foliate papillae 03/17/2022   Stage 3a chronic kidney disease ([place]) 09/22/2021   Numbness and tingling of both feet 10/23/2020   Chronic ischemic heart disease 10/19/2020   Hypertension associated with stage 3a chronic kidney disease due to type 2 diabetes mellitus ([place]) 10/19/2020   Anemia of chronic disease 11/12/2019   Hearing loss on right 11/26/2018   Labyrinthitis of right ear 10/25/2018   Spinal stenosis of lumbar region 09/13/2017   Spinal stenosis of lumbar region with neurogenic claudication 05/03/2017   Type 2 diabetes mellitus without complication, with long-term current use of insulin ([place]) 05/03/2017   Left hip pain 07/28/2016   ALT (SGPT) level raised 07/13/2015   At risk for falling 07/13/2015   Arthritis of hand, degenerative 07/13/2015   Gout 07/13/2015   Glaucoma 07/13/2015   Diabetic neuropathy ([place]) 07/13/2015   Hypertension 04/09/2015   Type 2 diabetes mellitus with microalbuminuria, with long-term current use of insulin ([place]) 04/09/2015   Dyslipidemia 04/09/2015   Hypertriglyceridemia 04/09/2015    Benign fibroma of prostate 04/09/2015   Acid reflux 04/09/2015   History of melanoma excision 04/09/2015   History of squamous cell carcinoma 05/23/2013   H/O adenomatous polyp of colon 08/25/2010   History of MI (myocardial infarction) 08/27/1979    Past Surgical History:  Procedure Laterality Date   CATARACT EXTRACTION W/ INTRAOCULAR LENS  IMPLANT, BILATERAL     CIRCUMCISION     COLONOSCOPY WITH PROPOFOL N/A 11/17/2021   Procedure: COLONOSCOPY WITH PROPOFOL;  Surgeon: Jonathon Bellows, MD;  Location: [facility] ENDOSCOPY;  Service: Gastroenterology;  Laterality: N/A;   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/10/2019   GANGLION CYST EXCISION     LEFT HAND   LUMBAR LAMINECTOMY/DECOMPRESSION MICRODISCECTOMY  09/13/2017   Procedure: BILATERAL Lumbar three-four , Left  Lumbar four-five  AND Left  L5-S1, LAMINECTOMY/FORAMINOTOMY;  Surgeon: [name], MD;  Location: [place];  Service: Neurosurgery;;   MELANOMA EXCISION  07/2011   TONSILLECTOMY     VASECTOMY      Family History  Problem Relation Age of Onset   Heart disease Mother    Diabetes Mother        type 2   Angina Mother    Heart disease Brother    Pneumonia Father    Heart attack Father    Alcoholism Daughter    Cirrhosis Daughter     Social History   Tobacco Use   Smoking status: Former    Packs/day: 2.00  Years: 13.00    Total pack years: 26.00    Types: Cigarettes    Quit date: 77    Years since quitting: 60.9   Smokeless tobacco: Never   Tobacco comments:    smoking cessation materials not required  Substance Use Topics   Alcohol use: Yes    Alcohol/week: 2.0 standard drinks of alcohol    Types: 2 Shots of liquor per week    Comment: DAILY     Current Outpatient Medications:    Accu-Chek FastClix Lancets MISC, CHECK  BLOOD  SUGAR TWICE DAILY, Disp: 204 each, Rfl: 1   allopurinol (ZYLOPRIM) 300 MG tablet, Take 1 tablet (300 mg total) by mouth daily., Disp: 90 tablet, Rfl: 1   amLODipine-olmesartan (AZOR) 10-40 MG  tablet, Take 1 tablet by mouth daily., Disp: 90 tablet, Rfl: 1   aspirin 81 MG tablet, Take 81 mg by mouth., Disp: , Rfl:    atorvastatin (LIPITOR) 10 MG tablet, TAKE 1 TABLET DAILY AT 6 PM., Disp: 90 tablet, Rfl: 1   Blood Glucose Monitoring Suppl (ACCU-CHEK NANO SMARTVIEW) W/DEVICE KIT, USE AS DIRECTED, Disp: 1 kit, Rfl: 0   Cholecalciferol (VITAMIN D) 2000 units tablet, Take 2,000 Units by mouth daily., Disp: , Rfl:    cyanocobalamin (,VITAMIN B-12,) 1000 MCG/ML injection, Inject 1,000 mcg as directed every 30 (thirty) days., Disp: , Rfl:    diclofenac Sodium (VOLTAREN) 1 % GEL, APPLY 4 GRAMS TOPICALLY 4 (FOUR) TIMES DAILY., Disp: 300 g, Rfl: 10   dorzolamide-timolol (COSOPT) 22.3-6.8 MG/ML ophthalmic solution, Place 1 drop into the left eye daily at 2 PM., Disp: , Rfl:    doxazosin (CARDURA) 4 MG tablet, TAKE 1 TABLET (4 MG TOTAL) BY MOUTH AT BEDTIME., Disp: 90 tablet, Rfl: 0   DROPLET PEN NEEDLES 31G X 5 MM MISC, USE AS NEEDED., Disp: 100 each, Rfl: 3   finasteride (PROSCAR) 5 MG tablet, TAKE 1 TABLET (5 MG TOTAL) BY MOUTH AT BEDTIME., Disp: 90 tablet, Rfl: 10   fluticasone (FLONASE) 50 MCG/ACT nasal spray, 2 sprays daily., Disp: , Rfl:    glucose blood (COOL BLOOD GLUCOSE TEST STRIPS) test strip, Use as instructed, Disp: 100 each, Rfl: 12   HYDROcodone-acetaminophen (NORCO) 10-325 MG tablet, Take 0.5-1 tablets by mouth every 8 (eight) hours as needed for up to 5 days., Disp: 15 tablet, Rfl: 0   insulin glargine (LANTUS SOLOSTAR) 100 UNIT/ML Solostar Pen, Inject 46 Units into the skin daily., Disp: 30 mL, Rfl: 0   JARDIANCE 10 MG TABS tablet, TAKE 1 TABLET EVERY DAY, Disp: 90 tablet, Rfl: 1   Multiple Vitamins-Minerals (CENTRUM SILVER 50+MEN) TABS, Take 1 tablet by mouth daily., Disp: , Rfl:    Omega-3 Fatty Acids (FISH OIL) 1000 MG CPDR, Take 1 capsule by mouth 2 (two) times daily., Disp: 60 capsule, Rfl: 0   omeprazole (PRILOSEC) 40 MG capsule, TAKE 1 CAPSULE EVERY DAY, Disp: 90 capsule,  Rfl: 1   polyethylene glycol powder (GLYCOLAX/MIRALAX) 17 GM/SCOOP powder, Take 17 g by mouth daily., Disp: 3350 g, Rfl: 1   prednisoLONE acetate (PRED FORTE) 1 % ophthalmic suspension, Place into the right eye., Disp: , Rfl:   Allergies  Allergen Reactions   Tetracyclines & Related Other (See Comments)    UNSPECIFIED REACTION  other    I personally reviewed active problem list, medication list, allergies, family history, social history, health maintenance with the patient/caregiver today.   ROS  Ten systems reviewed and is negative except as mentioned in HPI  Objective  Vitals:   08/22/22 1000  BP: 122/78  Pulse: 76  Resp: 18  Temp: 97.7 F (36.5 C)  TempSrc: Oral  SpO2: 98%  Weight: 172 lb 3.2 oz (78.1 kg)  Height: _0  (1.803 m)    Body mass index is 24.02 kg/m.  Physical Exam  Constitutional: Patient appears well-developed and well-nourished.  No distress.  HEENT: head atraumatic, normocephalic, pupils equal and reactive to light, neck supple, throat within normal limits Cardiovascular: Normal rate, regular rhythm and normal heart sounds.  No murmur heard. No BLE edema. Pulmonary/Chest: Effort normal and breath sounds normal. No respiratory distress. Abdominal: Soft.  There is no tenderness. Muscular skeletal: left wrist has ecchymosis, signs of OA but tender to touch on radial aspect , larger area of bruising on left flank, tender during palpation of left lower ribs  Psychiatric: Patient has a normal mood and affect. behavior is normal. Judgment and thought content normal.   PHQ2/9:    08/22/2022   10:02 AM 03/29/2022    9:16 AM 12/14/2021   10:38 AM 11/15/2021    8:08 AM 09/22/2021    9:28 AM  Depression screen PHQ 2/9  Decreased Interest 0 0 0 0 0  Down, Depressed, Hopeless 0 0 0 0 0  PHQ - 2 Score 0 0 0 0 0  Altered sleeping 0  0 0 0  Tired, decreased energy 0  0 0 0  Change in appetite 0  0 0 0  Feeling bad or failure about yourself  0  0 0 0   Trouble concentrating 0  0 0 0  Moving slowly or fidgety/restless 0  0 0 0  Suicidal thoughts 0  0 0 0  PHQ-9 Score 0  0 0 0  Difficult doing work/chores   Not difficult at all Not difficult at all     phq 9 is negative   Fall Risk:    08/22/2022   10:02 AM 03/29/2022    9:16 AM 12/14/2021   10:40 AM 11/15/2021    8:08 AM 09/22/2021    9:28 AM  Fall Risk   Falls in the past year? 1 0 0 0 0  Number falls in past yr: 0 0 0 0 0  Injury with Fall? 1 0 0 0 0  Risk for fall due to : History of fall(s) No Fall Risks No Fall Risks  No Fall Risks  Follow up Education provided;Falls prevention discussed;Falls evaluation completed Falls prevention discussed Falls prevention discussed  Falls prevention discussed     Assessment & Plan  1. Fall, initial encounter  - DG Wrist Complete Left; Future - DG Ribs Unilateral Left; Future - HYDROcodone-acetaminophen (NORCO) 10-325 MG tablet; Take 0.5-1 tablets by mouth every 8 (eight) hours as needed for up to 5 days.  Dispense: 15 tablet; Refill: 0  2. Traumatic ecchymosis of left wrist, initial encounter  - DG Wrist Complete Left; Future - HYDROcodone-acetaminophen (NORCO) 10-325 MG tablet; Take 0.5-1 tablets by mouth every 8 (eight) hours as needed for up to 5 days.  Dispense: 15 tablet; Refill: 0  3. Left-sided chest wall pain  - DG Ribs Unilateral Left; Future - HYDROcodone-acetaminophen (NORCO) 10-325 MG tablet; Take 0.5-1 tablets by mouth every 8 (eight) hours as needed for up to 5 days.  Dispense: 15 tablet; Refill: 0   He is taking tylenol , advised to take at most 5 per day since he will take hydrocodone with tylenol at night

## 2022-08-26 ENCOUNTER — Ambulatory Visit: Payer: Self-pay | Admitting: *Deleted

## 2022-08-26 ENCOUNTER — Ambulatory Visit
Admission: RE | Admit: 2022-08-26 | Discharge: 2022-08-26 | Disposition: A | Discharge status: Home / Self Care | Payer: Medicare HMO | Source: Ambulatory Visit | Attending: Family Medicine | Admitting: Family Medicine

## 2022-08-26 ENCOUNTER — Other Ambulatory Visit: Payer: Self-pay | Admitting: Family Medicine

## 2022-08-26 DIAGNOSIS — S60212A Contusion of left wrist, initial encounter: Secondary | ICD-10-CM

## 2022-08-26 DIAGNOSIS — I700 Atherosclerosis of aorta: Secondary | ICD-10-CM | POA: Diagnosis not present

## 2022-08-26 DIAGNOSIS — R109 Unspecified abdominal pain: Secondary | ICD-10-CM

## 2022-08-26 DIAGNOSIS — S3981XA Other specified injuries of abdomen, initial encounter: Secondary | ICD-10-CM | POA: Insufficient documentation

## 2022-08-26 DIAGNOSIS — Z9181 History of falling: Secondary | ICD-10-CM | POA: Insufficient documentation

## 2022-08-26 DIAGNOSIS — R0789 Other chest pain: Secondary | ICD-10-CM

## 2022-08-26 DIAGNOSIS — W19XXXA Unspecified fall, initial encounter: Secondary | ICD-10-CM

## 2022-08-26 MED ORDER — HYDROCODONE-ACETAMINOPHEN 10-325 MG PO TABS: 0.5000 | ORAL_TABLET | Freq: Three times a day (TID) | ORAL | 0 refills | Status: AC | PRN

## 2022-08-26 NOTE — Telephone Encounter (Signed)
Reason for Disposition . [1] Follow-up call to recent contact AND [2] information only call, no triage required  Answer Assessment - Initial Assessment Questions 1. REASON FOR CALL or QUESTION: "What is your reason for calling today?" or "How can I best help you?" or "What question do you have that I can help answer?"     Pt called in and was given the x ray reports on his wrist and ribs.   Message from Dr. Ancil Boozer read to him dated 08/23/2022 at 8:04 AM.   He is still having a lot of pain and is agreeable to the U/S of his spleen being done.  Protocols used: Information Only Call - No Triage-A-AH

## 2022-08-26 NOTE — Telephone Encounter (Signed)
CT today at 1:30pm

## 2022-09-04 ENCOUNTER — Other Ambulatory Visit: Payer: Self-pay | Admitting: Family Medicine

## 2022-09-04 DIAGNOSIS — E785 Hyperlipidemia, unspecified: Secondary | ICD-10-CM

## 2022-09-21 DIAGNOSIS — M7661 Achilles tendinitis, right leg: Secondary | ICD-10-CM | POA: Diagnosis not present

## 2022-09-27 NOTE — Progress Notes (Signed)
Name: Tracy [name].   MRN: [id number]    DOB: 01-21-1930   Date:09/28/2022       Progress Note  Subjective  Chief Complaint  Follow Up  HPI  GERD: we switched from Pepcid to PPI back in March 2021 ,he is currently on Omeprazole and doing well at this time   Partial tear of achilles tendon: seen by Dr. Milinda Pointer and followed by Dr. Sherryle Lis and was told not a rupture but tendinitis. 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 He has been doing well since, denies chest pain, SOB or orthopnea. Denies decrease in exercise tolerance. Achilles tendinitis has improved and he just resumed his daily walks   Change in bowel movements: he was seen by Dr. Vicente Males, had colonoscopy, he is now taking otc Clear Lax once a day and bowel movements back to normal and is doing well, with bowel movements once a day and smooth, bristol 4, he had some linzess 145 mg but it caused diarrhea    DMII: he sees Dr. Kenton Kingfisher at [facility] usually once a year, taking Lantus 46 units and Jardiance, A1C today is 7.6 %, he states his diet is balanced, has hypoglycemic episodes at most once a month and drinks orange juice. . He has dyslipidemia but LDL is at goal, CKI stage III and is taking Jardiance and ACE. We will recheck labs today . Eye exam is up to date    Hyperlipidemia: taking medication and denies side effects, LDL is at goal. No myalgia. Denies recent cramps We will recheck labs today   HTN: he is compliant with his medications,  no chest pain or palpitation, no dizziness. BP is towards low end of normal today we will decrease dose of cardura and see is bp gets better     Hearing loss sudden, happened after an URI back in Dec 2020  seen by Dr. Tami Ribas and had steroid and is using a hearing aid - but did not wear it today because it is bothersome in a smaller room .Unchanged    Osteoarthritis: he has  OA of left thumb, intermittent medial knee pain when moving/walking but responding to topical medication Taking Tylenol and voltaren ge prn.He recently resumed his daily walks   Gout: under control with allopurinol 300 mg daily , we will recheck labs today    Senile purpura: on arms and it is stable , reassurance given again    History of skin cancer: he is compliant with dermatologist    BPH: sees Urologist, no nocturia, symptoms controlled. He is happy with medications - takes Cardura and finasteride  daily . He is 89  and we will not check PSA. We will decrease dose of cardura due to low bp and risk of falls   Patient Active Problem List   Diagnosis Date Noted   Coronary artery disease 03/17/2022   Hypertrophy foliate papillae 03/17/2022   Stage 3a chronic kidney disease ([place]) 09/22/2021   Numbness and tingling of both feet 10/23/2020   Chronic ischemic heart disease 10/19/2020   Hypertension associated with stage 3a chronic kidney disease due to type 2 diabetes mellitus ([place]) 10/19/2020   Anemia of chronic disease 11/12/2019   Hearing loss on right 11/26/2018   Labyrinthitis of right ear 10/25/2018   Spinal stenosis of lumbar region 09/13/2017   Spinal stenosis of lumbar region with  neurogenic claudication 05/03/2017   Type 2 diabetes mellitus without complication, with long-term current use of insulin ([place]) 05/03/2017   Left hip pain 07/28/2016   ALT (SGPT) level raised 07/13/2015   At risk for falling 07/13/2015   Arthritis of hand, degenerative 07/13/2015   Gout 07/13/2015   Glaucoma 07/13/2015   Diabetic neuropathy ([place]) 07/13/2015   Hypertension 04/09/2015   Type 2 diabetes mellitus with microalbuminuria, with long-term current use of insulin ([place]) 04/09/2015   Dyslipidemia 04/09/2015   Hypertriglyceridemia 04/09/2015   Benign fibroma of prostate 04/09/2015   Acid reflux 04/09/2015   History of melanoma excision 04/09/2015   History of squamous cell carcinoma 05/23/2013    H/O adenomatous polyp of colon 08/25/2010   History of MI (myocardial infarction) 08/27/1979    Past Surgical History:  Procedure Laterality Date   CATARACT EXTRACTION W/ INTRAOCULAR LENS  IMPLANT, BILATERAL     CIRCUMCISION     COLONOSCOPY WITH PROPOFOL N/A 11/17/2021   Procedure: COLONOSCOPY WITH PROPOFOL;  Surgeon: Jonathon Bellows, MD;  Location: [facility] ENDOSCOPY;  Service: Gastroenterology;  Laterality: N/A;   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/10/2019   GANGLION CYST EXCISION     LEFT HAND   LUMBAR LAMINECTOMY/DECOMPRESSION MICRODISCECTOMY  09/13/2017   Procedure: BILATERAL Lumbar three-four , Left  Lumbar four-five  AND Left  L5-S1, LAMINECTOMY/FORAMINOTOMY;  Surgeon: [name], MD;  Location: [place];  Service: Neurosurgery;;   MELANOMA EXCISION  07/2011   TONSILLECTOMY     VASECTOMY      Family History  Problem Relation Age of Onset   Heart disease Mother    Diabetes Mother        type 2   Angina Mother    Heart disease Brother    Pneumonia Father    Heart attack Father    Alcoholism Daughter    Cirrhosis Daughter     Social History   Tobacco Use   Smoking status: Former    Packs/day: 2.00    Years: 13.00    Total pack years: 26.00    Types: Cigarettes    Quit date: 1963    Years since quitting: 61.0   Smokeless tobacco: Never   Tobacco comments:    smoking cessation materials not required  Substance Use Topics   Alcohol use: Yes    Alcohol/week: 2.0 standard drinks of alcohol    Types: 2 Shots of liquor per week    Comment: DAILY     Current Outpatient Medications:    Accu-Chek FastClix Lancets MISC, CHECK  BLOOD  SUGAR TWICE DAILY, Disp: 204 each, Rfl: 1   allopurinol (ZYLOPRIM) 300 MG tablet, Take 1 tablet (300 mg total) by mouth daily., Disp: 90 tablet, Rfl: 1   amLODipine-olmesartan (AZOR) 10-40 MG tablet, Take 1 tablet by mouth daily., Disp: 90 tablet, Rfl: 1   aspirin 81 MG tablet, Take 81 mg by mouth., Disp: , Rfl:    atorvastatin (LIPITOR) 10 MG tablet,  TAKE 1 TABLET DAILY AT 6 PM., Disp: 90 tablet, Rfl: 0   Blood Glucose Monitoring Suppl (ACCU-CHEK NANO SMARTVIEW) W/DEVICE KIT, USE AS DIRECTED, Disp: 1 kit, Rfl: 0   Cholecalciferol (VITAMIN D) 2000 units tablet, Take 2,000 Units by mouth daily., Disp: , Rfl:    dorzolamide-timolol (COSOPT) 22.3-6.8 MG/ML ophthalmic solution, Place 1 drop into the left eye daily at 2 PM., Disp: , Rfl:    doxazosin (CARDURA) 4 MG tablet, TAKE 1 TABLET (4 MG TOTAL) BY MOUTH AT BEDTIME., Disp: 90 tablet, Rfl: 0  DROPLET PEN NEEDLES 31G X 5 MM MISC, USE AS NEEDED., Disp: 100 each, Rfl: 3   finasteride (PROSCAR) 5 MG tablet, TAKE 1 TABLET (5 MG TOTAL) BY MOUTH AT BEDTIME., Disp: 90 tablet, Rfl: 10   glucose blood (COOL BLOOD GLUCOSE TEST STRIPS) test strip, Use as instructed, Disp: 100 each, Rfl: 12   insulin glargine (LANTUS SOLOSTAR) 100 UNIT/ML Solostar Pen, Inject 46 Units into the skin daily., Disp: 30 mL, Rfl: 0   JARDIANCE 10 MG TABS tablet, TAKE 1 TABLET EVERY DAY, Disp: 90 tablet, Rfl: 1   Multiple Vitamins-Minerals (CENTRUM SILVER 50+MEN) TABS, Take 1 tablet by mouth daily., Disp: , Rfl:    Omega-3 Fatty Acids (FISH OIL) 1000 MG CPDR, Take 1 capsule by mouth 2 (two) times daily., Disp: 60 capsule, Rfl: 0   omeprazole (PRILOSEC) 40 MG capsule, TAKE 1 CAPSULE EVERY DAY, Disp: 90 capsule, Rfl: 1   polyethylene glycol powder (GLYCOLAX/MIRALAX) 17 GM/SCOOP powder, Take 17 g by mouth daily., Disp: 3350 g, Rfl: 1   prednisoLONE acetate (PRED FORTE) 1 % ophthalmic suspension, Place into the right eye., Disp: , Rfl:    cyanocobalamin (,VITAMIN B-12,) 1000 MCG/ML injection, Inject 1,000 mcg as directed every 30 (thirty) days. (Patient not taking: Reported on 09/28/2022), Disp: , Rfl:    diclofenac Sodium (VOLTAREN) 1 % GEL, APPLY 4 GRAMS TOPICALLY 4 (FOUR) TIMES DAILY. (Patient not taking: Reported on 09/28/2022), Disp: 300 g, Rfl: 10   fluticasone (FLONASE) 50 MCG/ACT nasal spray, 2 sprays daily. (Patient not taking:  Reported on 09/28/2022), Disp: , Rfl:   Allergies  Allergen Reactions   Tetracyclines & Related Other (See Comments)    UNSPECIFIED REACTION  other    I personally reviewed active problem list, medication list, allergies, family history, social history, health maintenance with the patient/caregiver today.   ROS  Constitutional: Negative for fever or weight change.  Respiratory: Negative for cough and shortness of breath.   Cardiovascular: Negative for chest pain or palpitations.  Gastrointestinal: Negative for abdominal pain, no bowel changes.  Musculoskeletal: Negative for gait problem or joint swelling.  Skin: Negative for rash.  Neurological: Negative for dizziness or headache.  No other specific complaints in a complete review of systems (except as listed in HPI above).   Objective  Vitals:   09/28/22 0920  BP: 118/68  Pulse: 75  Resp: 16  SpO2: 98%  Weight: 173 lb (78.5 kg)  Height: '5\' 10"'$  (1.778 m)    Body mass index is 24.82 kg/m.  Physical Exam  Constitutional: Patient appears well-developed and well-nourished. No distress.  HEENT: head atraumatic, normocephalic, pupils equal and reactive to light, neck supple Cardiovascular: Normal rate, regular rhythm and normal heart sounds.  No murmur heard. No BLE edema. Pulmonary/Chest: Effort normal and breath sounds normal. No respiratory distress. Abdominal: Soft.  There is no tenderness. Psychiatric: Patient has a normal mood and affect. behavior is normal. Judgment and thought content normal.   PHQ2/9:    09/28/2022    9:21 AM 08/22/2022   10:02 AM 03/29/2022    9:16 AM 12/14/2021   10:38 AM 11/15/2021    8:08 AM  Depression screen PHQ 2/9  Decreased Interest 0 0 0 0 0  Down, Depressed, Hopeless 0 0 0 0 0  PHQ - 2 Score 0 0 0 0 0  Altered sleeping 0 0  0 0  Tired, decreased energy 0 0  0 0  Change in appetite 0 0  0 0  Feeling bad or  failure about yourself  0 0  0 0  Trouble concentrating 0 0  0 0  Moving  slowly or fidgety/restless 0 0  0 0  Suicidal thoughts 0 0  0 0  PHQ-9 Score 0 0  0 0  Difficult doing work/chores    Not difficult at all Not difficult at all    phq 9 is negative   Fall Risk:    09/28/2022    9:21 AM 08/22/2022   10:02 AM 03/29/2022    9:16 AM 12/14/2021   10:40 AM 11/15/2021    8:08 AM  Fall Risk   Falls in the past year? 1 1 0 0 0  Number falls in past yr: 1 0 0 0 0  Injury with Fall? 0 1 0 0 0  Risk for fall due to : No Fall Risks History of fall(s) No Fall Risks No Fall Risks   Follow up Falls prevention discussed Education provided;Falls prevention discussed;Falls evaluation completed Falls prevention discussed Falls prevention discussed       Functional Status Survey: Is the patient deaf or have difficulty hearing?: Yes Does the patient have difficulty seeing, even when wearing glasses/contacts?: No Does the patient have difficulty concentrating, remembering, or making decisions?: No Does the patient have difficulty walking or climbing stairs?: No Does the patient have difficulty dressing or bathing?: No Does the patient have difficulty doing errands alone such as visiting a doctor's office or shopping?: No    Assessment & Plan  1. Controlled type 2 diabetes mellitus with microalbuminuria, with long-term current use of insulin (HCC)  - POCT HgB A1C - Microalbumin / creatinine urine ratio - JARDIANCE 10 MG TABS tablet; Take 1 tablet (10 mg total) by mouth daily.  Dispense: 90 tablet; Refill: 1 - Insulin Pen Needle (DROPLET PEN NEEDLES) 31G X 5 MM MISC; USE AS NEEDED.  Dispense: 100 each; Refill: 3  2. Senile purpura (HCC)  Stable and reassurance given  3. Claudication ([place])  stable  4. Hypertension associated with stage 3a chronic kidney disease due to type 2 diabetes mellitus (HCC)  - amLODipine-benazepril (LOTREL) 10-40 MG capsule; Take 1 capsule by mouth daily.  Dispense: 90 capsule; Refill: 0  5. Stage 3a chronic kidney disease (HCC)  -  COMPLETE METABOLIC PANEL WITH GFR - CBC with Differential/Platelet  6. GERD without esophagitis  - omeprazole (PRILOSEC) 40 MG capsule; Take 1 capsule (40 mg total) by mouth daily.  Dispense: 90 capsule; Refill: 1  7. Need for immunization against influenza  - Flu Vaccine QUAD High Dose(Fluad)  8. Controlled gout  - Uric acid - allopurinol (ZYLOPRIM) 300 MG tablet; Take 1 tablet (300 mg total) by mouth daily.  Dispense: 90 tablet; Refill: 1  9. Vitamin D deficiency  - VITAMIN D 25 Hydroxy (Vit-D Deficiency, Fractures)  10. Anemia of chronic disease  - CBC with Differential/Platelet  11. BPH associated with nocturia   12. Dyslipidemia  - Lipid panel  13. Long-term use of high-risk medication  - Vitamin B12  14. Incomplete bladder emptying  - doxazosin (CARDURA) 2 MG tablet; Take 1 tablet (2 mg total) by mouth at bedtime.  Dispense: 90 tablet; Refill: 1

## 2022-09-28 ENCOUNTER — Ambulatory Visit (INDEPENDENT_AMBULATORY_CARE_PROVIDER_SITE_OTHER): Payer: Medicare HMO | Admitting: Family Medicine

## 2022-09-28 ENCOUNTER — Encounter: Payer: Self-pay | Admitting: Family Medicine

## 2022-09-28 VITALS — BP 118/68 | HR 75 | Resp 16 | Ht 70.0 in | Wt 173.0 lb

## 2022-09-28 DIAGNOSIS — D692 Other nonthrombocytopenic purpura: Secondary | ICD-10-CM

## 2022-09-28 DIAGNOSIS — N1831 Chronic kidney disease, stage 3a: Secondary | ICD-10-CM | POA: Diagnosis not present

## 2022-09-28 DIAGNOSIS — Z79899 Other long term (current) drug therapy: Secondary | ICD-10-CM

## 2022-09-28 DIAGNOSIS — E1129 Type 2 diabetes mellitus with other diabetic kidney complication: Secondary | ICD-10-CM | POA: Diagnosis not present

## 2022-09-28 DIAGNOSIS — E1122 Type 2 diabetes mellitus with diabetic chronic kidney disease: Secondary | ICD-10-CM

## 2022-09-28 DIAGNOSIS — M109 Gout, unspecified: Secondary | ICD-10-CM

## 2022-09-28 DIAGNOSIS — Z794 Long term (current) use of insulin: Secondary | ICD-10-CM | POA: Diagnosis not present

## 2022-09-28 DIAGNOSIS — R809 Proteinuria, unspecified: Secondary | ICD-10-CM | POA: Diagnosis not present

## 2022-09-28 DIAGNOSIS — E559 Vitamin D deficiency, unspecified: Secondary | ICD-10-CM | POA: Diagnosis not present

## 2022-09-28 DIAGNOSIS — N401 Enlarged prostate with lower urinary tract symptoms: Secondary | ICD-10-CM

## 2022-09-28 DIAGNOSIS — D638 Anemia in other chronic diseases classified elsewhere: Secondary | ICD-10-CM

## 2022-09-28 DIAGNOSIS — K219 Gastro-esophageal reflux disease without esophagitis: Secondary | ICD-10-CM

## 2022-09-28 DIAGNOSIS — E785 Hyperlipidemia, unspecified: Secondary | ICD-10-CM

## 2022-09-28 DIAGNOSIS — I129 Hypertensive chronic kidney disease with stage 1 through stage 4 chronic kidney disease, or unspecified chronic kidney disease: Secondary | ICD-10-CM

## 2022-09-28 DIAGNOSIS — I739 Peripheral vascular disease, unspecified: Secondary | ICD-10-CM | POA: Diagnosis not present

## 2022-09-28 DIAGNOSIS — R351 Nocturia: Secondary | ICD-10-CM

## 2022-09-28 DIAGNOSIS — Z23 Encounter for immunization: Secondary | ICD-10-CM | POA: Diagnosis not present

## 2022-09-28 DIAGNOSIS — R339 Retention of urine, unspecified: Secondary | ICD-10-CM

## 2022-09-28 LAB — POCT GLYCOSYLATED HEMOGLOBIN (HGB A1C): Hemoglobin A1C: 7.6 % — AB (ref 4.0–5.6)

## 2022-09-28 MED ORDER — DROPLET PEN NEEDLES 31G X 5 MM MISC: 3 refills | Status: DC

## 2022-09-28 MED ORDER — JARDIANCE 10 MG PO TABS: 10.0000 mg | ORAL_TABLET | Freq: Every day | ORAL | 1 refills | Status: DC

## 2022-09-28 MED ORDER — AMLODIPINE BESY-BENAZEPRIL HCL 10-40 MG PO CAPS: 1.0000 | ORAL_CAPSULE | Freq: Every day | ORAL | 0 refills | Status: DC

## 2022-09-28 MED ORDER — ALLOPURINOL 300 MG PO TABS: 300.0000 mg | ORAL_TABLET | Freq: Every day | ORAL | 1 refills | Status: DC

## 2022-09-28 MED ORDER — DOXAZOSIN MESYLATE 2 MG PO TABS: 2.0000 mg | ORAL_TABLET | Freq: Every day | ORAL | 1 refills | Status: DC

## 2022-09-28 MED ORDER — OMEPRAZOLE 40 MG PO CPDR: 40.0000 mg | DELAYED_RELEASE_CAPSULE | Freq: Every day | ORAL | 1 refills | Status: DC

## 2022-09-29 LAB — COMPLETE METABOLIC PANEL WITH GFR
AG Ratio: 1.7 (calc) (ref 1.0–2.5)
ALT: 22 U/L (ref 9–46)
AST: 19 U/L (ref 10–35)
Albumin: 4.1 g/dL (ref 3.6–5.1)
Alkaline phosphatase (APISO): 50 U/L (ref 35–144)
BUN/Creatinine Ratio: 24 (calc) — ABNORMAL HIGH (ref 6–22)
BUN: 27 mg/dL — ABNORMAL HIGH (ref 7–25)
CO2: 25 mmol/L (ref 20–32)
Calcium: 9.5 mg/dL (ref 8.6–10.3)
Chloride: 106 mmol/L (ref 98–110)
Creat: 1.13 mg/dL (ref 0.70–1.22)
Globulin: 2.4 g/dL (calc) (ref 1.9–3.7)
Glucose, Bld: 161 mg/dL — ABNORMAL HIGH (ref 65–99)
Potassium: 4.6 mmol/L (ref 3.5–5.3)
Sodium: 140 mmol/L (ref 135–146)
Total Bilirubin: 0.4 mg/dL (ref 0.2–1.2)
Total Protein: 6.5 g/dL (ref 6.1–8.1)
eGFR: 62 mL/min/{1.73_m2} (ref >=60)

## 2022-09-29 LAB — URIC ACID: Uric Acid, Serum: 3.2 mg/dL — ABNORMAL LOW (ref 4.0–8.0)

## 2022-09-29 LAB — LIPID PANEL
Cholesterol: 122 mg/dL (ref <200)
HDL: 50 mg/dL (ref >=40)
LDL Cholesterol (Calc): 42 mg/dL (calc)
Non-HDL Cholesterol (Calc): 72 mg/dL (calc) (ref <130)
Total CHOL/HDL Ratio: 2.4 (calc) (ref <5.0)
Triglycerides: 257 mg/dL — ABNORMAL HIGH (ref <150)

## 2022-09-29 LAB — CBC WITH DIFFERENTIAL/PLATELET
Absolute Monocytes: 627 cells/uL (ref 200–950)
Basophils Absolute: 51 cells/uL (ref 0–200)
Basophils Relative: 0.8 %
Eosinophils Absolute: 320 cells/uL (ref 15–500)
Eosinophils Relative: 5 %
HCT: 40.1 % (ref 38.5–50.0)
Hemoglobin: 13.9 g/dL (ref 13.2–17.1)
Lymphs Abs: 1702 cells/uL (ref 850–3900)
MCH: 34.6 pg — ABNORMAL HIGH (ref 27.0–33.0)
MCHC: 34.7 g/dL (ref 32.0–36.0)
MCV: 99.8 fL (ref 80.0–100.0)
MPV: 11.8 fL (ref 7.5–12.5)
Monocytes Relative: 9.8 %
Neutro Abs: 3699 cells/uL (ref 1500–7800)
Neutrophils Relative %: 57.8 %
Platelets: 138 10*3/uL — ABNORMAL LOW (ref 140–400)
RBC: 4.02 10*6/uL — ABNORMAL LOW (ref 4.20–5.80)
RDW: 13.2 % (ref 11.0–15.0)
Total Lymphocyte: 26.6 %
WBC: 6.4 10*3/uL (ref 3.8–10.8)

## 2022-09-29 LAB — MICROALBUMIN / CREATININE URINE RATIO
Creatinine, Urine: 47 mg/dL (ref 20–320)
Microalb Creat Ratio: 115 mcg/mg creat — ABNORMAL HIGH (ref <30)
Microalb, Ur: 5.4 mg/dL

## 2022-09-29 LAB — VITAMIN D 25 HYDROXY (VIT D DEFICIENCY, FRACTURES): Vit D, 25-Hydroxy: 52 ng/mL (ref 30–100)

## 2022-09-29 LAB — VITAMIN B12: Vitamin B-12: 874 pg/mL (ref 200–1100)

## 2022-10-04 DIAGNOSIS — L309 Dermatitis, unspecified: Secondary | ICD-10-CM | POA: Diagnosis not present

## 2022-10-12 ENCOUNTER — Ambulatory Visit: Payer: Medicare HMO

## 2022-10-12 VITALS — BP 122/70

## 2022-10-12 DIAGNOSIS — Z0130 Encounter for examination of blood pressure without abnormal findings: Secondary | ICD-10-CM

## 2022-11-24 DIAGNOSIS — H903 Sensorineural hearing loss, bilateral: Secondary | ICD-10-CM | POA: Diagnosis not present

## 2022-11-24 DIAGNOSIS — H6123 Impacted cerumen, bilateral: Secondary | ICD-10-CM | POA: Diagnosis not present

## 2022-11-28 DIAGNOSIS — H401132 Primary open-angle glaucoma, bilateral, moderate stage: Secondary | ICD-10-CM | POA: Diagnosis not present

## 2022-12-05 DIAGNOSIS — Z85820 Personal history of malignant melanoma of skin: Secondary | ICD-10-CM | POA: Diagnosis not present

## 2022-12-05 DIAGNOSIS — D2261 Melanocytic nevi of right upper limb, including shoulder: Secondary | ICD-10-CM | POA: Diagnosis not present

## 2022-12-05 DIAGNOSIS — Z0100 Encounter for examination of eyes and vision without abnormal findings: Secondary | ICD-10-CM | POA: Diagnosis not present

## 2022-12-05 DIAGNOSIS — H401132 Primary open-angle glaucoma, bilateral, moderate stage: Secondary | ICD-10-CM | POA: Diagnosis not present

## 2022-12-05 DIAGNOSIS — L570 Actinic keratosis: Secondary | ICD-10-CM | POA: Diagnosis not present

## 2022-12-05 DIAGNOSIS — L300 Nummular dermatitis: Secondary | ICD-10-CM | POA: Diagnosis not present

## 2022-12-05 DIAGNOSIS — D225 Melanocytic nevi of trunk: Secondary | ICD-10-CM | POA: Diagnosis not present

## 2022-12-05 DIAGNOSIS — Z85828 Personal history of other malignant neoplasm of skin: Secondary | ICD-10-CM | POA: Diagnosis not present

## 2022-12-05 DIAGNOSIS — X32XXXA Exposure to sunlight, initial encounter: Secondary | ICD-10-CM | POA: Diagnosis not present

## 2022-12-05 DIAGNOSIS — L820 Inflamed seborrheic keratosis: Secondary | ICD-10-CM | POA: Diagnosis not present

## 2022-12-05 DIAGNOSIS — D2271 Melanocytic nevi of right lower limb, including hip: Secondary | ICD-10-CM | POA: Diagnosis not present

## 2022-12-05 DIAGNOSIS — D0421 Carcinoma in situ of skin of right ear and external auricular canal: Secondary | ICD-10-CM | POA: Diagnosis not present

## 2022-12-05 DIAGNOSIS — D485 Neoplasm of uncertain behavior of skin: Secondary | ICD-10-CM | POA: Diagnosis not present

## 2022-12-05 LAB — HM DIABETES EYE EXAM

## 2022-12-08 ENCOUNTER — Other Ambulatory Visit: Payer: Self-pay | Admitting: Family Medicine

## 2022-12-08 DIAGNOSIS — E1122 Type 2 diabetes mellitus with diabetic chronic kidney disease: Secondary | ICD-10-CM

## 2022-12-16 ENCOUNTER — Ambulatory Visit (INDEPENDENT_AMBULATORY_CARE_PROVIDER_SITE_OTHER): Payer: Medicare HMO

## 2022-12-16 VITALS — BP 124/68 | Ht 70.75 in | Wt 173.0 lb

## 2022-12-16 DIAGNOSIS — Z0000 Encounter for general adult medical examination without abnormal findings: Secondary | ICD-10-CM

## 2022-12-16 NOTE — Patient Instructions (Signed)
Tracy [name] , Thank you for taking time to come for your Medicare Wellness Visit. I appreciate your ongoing commitment to your health goals. Please review the following plan we discussed and let me know if I can assist you in the future.   These are the goals we discussed:  Goals      DIET - INCREASE WATER INTAKE     Recommend to drink at least 6-8 8oz glasses of water per day.        This is a list of the screening recommended for you and due dates:  Health Maintenance  Topic Date Due   COVID-19 Vaccine (4 - 2023-24 season) 05/13/2022   Complete foot exam   11/23/2022   Hemoglobin A1C  03/29/2023   Flu Shot  04/13/2023   Eye exam for diabetics  12/05/2023   Medicare Annual Wellness Visit  12/16/2023   DTaP/Tdap/Td vaccine (2 - Td or Tdap) 11/29/2026   Pneumonia Vaccine  Completed   Zoster (Shingles) Vaccine  Completed   HPV Vaccine  Aged Out    Advanced directives: yes  Conditions/risks identified: falls risk  Next appointment: Follow up in one year for your annual wellness visit. 12/22/2023 @9 :45am telephone  Preventive Care 65 Years and Older, Male  Preventive care refers to lifestyle choices and visits with your health care provider that can promote health and wellness. What does preventive care include? A yearly physical exam. This is also called an annual well check. Dental exams once or twice a year. Routine eye exams. Ask your health care provider how often you should have your eyes checked. Personal lifestyle choices, including: Daily care of your teeth and gums. Regular physical activity. Eating a healthy diet. Avoiding tobacco and drug use. Limiting alcohol use. Practicing safe sex. Taking low doses of aspirin every day. Taking vitamin and mineral supplements as recommended by your health care provider. What happens during an annual well check? The services and screenings done by your health care provider during your annual well check will depend on your  age, overall health, lifestyle risk factors, and family history of disease. Counseling  Your health care provider may ask you questions about your: Alcohol use. Tobacco use. Drug use. Emotional well-being. Home and relationship well-being. Sexual activity. Eating habits. History of falls. Memory and ability to understand (cognition). Work and work Astronomer. Screening  You may have the following tests or measurements: Height, weight, and BMI. Blood pressure. Lipid and cholesterol levels. These may be checked every 5 years, or more frequently if you are over 59 years old. Skin check. Lung cancer screening. You may have this screening every year starting at age 35 if you have a 30-pack-year history of smoking and currently smoke or have quit within the past 15 years. Fecal occult blood test (FOBT) of the stool. You may have this test every year starting at age 18. Flexible sigmoidoscopy or colonoscopy. You may have a sigmoidoscopy every 5 years or a colonoscopy every 10 years starting at age 23. Prostate cancer screening. Recommendations will vary depending on your family history and other risks. Hepatitis C blood test. Hepatitis B blood test. Sexually transmitted disease (STD) testing. Diabetes screening. This is done by checking your blood sugar (glucose) after you have not eaten for a while (fasting). You may have this done every 1-3 years. Abdominal aortic aneurysm (AAA) screening. You may need this if you are a current or former smoker. Osteoporosis. You may be screened starting at age 27 if you are  at high risk. Talk with your health care provider about your test results, treatment options, and if necessary, the need for more tests. Vaccines  Your health care provider may recommend certain vaccines, such as: Influenza vaccine. This is recommended every year. Tetanus, diphtheria, and acellular pertussis (Tdap, Td) vaccine. You may need a Td booster every 10 years. Zoster  vaccine. You may need this after age 87. Pneumococcal 13-valent conjugate (PCV13) vaccine. One dose is recommended after age 87. Pneumococcal polysaccharide (PPSV23) vaccine. One dose is recommended after age 87. Talk to your health care provider about which screenings and vaccines you need and how often you need them. This information is not intended to replace advice given to you by your health care provider. Make sure you discuss any questions you have with your health care provider. Document Released: 09/25/2015 Document Revised: 05/18/2016 Document Reviewed: 06/30/2015 Elsevier Interactive Patient Education  2017 [employer]  Fall Prevention in the Home Falls can cause injuries. They can happen to people of all ages. There are many things you can do to make your home safe and to help prevent falls. What can I do on the outside of my home? Regularly fix the edges of walkways and driveways and fix any cracks. Remove anything that might make you trip as you walk through a door, such as a raised step or threshold. Trim any bushes or trees on the path to your home. Use bright outdoor lighting. Clear any walking paths of anything that might make someone trip, such as rocks or tools. Regularly check to see if handrails are loose or broken. Make sure that both sides of any steps have handrails. Any raised decks and porches should have guardrails on the edges. Have any leaves, snow, or ice cleared regularly. Use sand or salt on walking paths during winter. Clean up any spills in your garage right away. This includes oil or grease spills. What can I do in the bathroom? Use night lights. Install grab bars by the toilet and in the tub and shower. Do not use towel bars as grab bars. Use non-skid mats or decals in the tub or shower. If you need to sit down in the shower, use a plastic, non-slip stool. Keep the floor dry. Clean up any water that spills on the floor as soon as it happens. Remove  soap buildup in the tub or shower regularly. Attach bath mats securely with double-sided non-slip rug tape. Do not have throw rugs and other things on the floor that can make you trip. What can I do in the bedroom? Use night lights. Make sure that you have a light by your bed that is easy to reach. Do not use any sheets or blankets that are too big for your bed. They should not hang down onto the floor. Have a firm chair that has side arms. You can use this for support while you get dressed. Do not have throw rugs and other things on the floor that can make you trip. What can I do in the kitchen? Clean up any spills right away. Avoid walking on wet floors. Keep items that you use a lot in easy-to-reach places. If you need to reach something above you, use a strong step stool that has a grab bar. Keep electrical cords out of the way. Do not use floor polish or wax that makes floors slippery. If you must use wax, use non-skid floor wax. Do not have throw rugs and other things on the floor  that can make you trip. What can I do with my stairs? Do not leave any items on the stairs. Make sure that there are handrails on both sides of the stairs and use them. Fix handrails that are broken or loose. Make sure that handrails are as long as the stairways. Check any carpeting to make sure that it is firmly attached to the stairs. Fix any carpet that is loose or worn. Avoid having throw rugs at the top or bottom of the stairs. If you do have throw rugs, attach them to the floor with carpet tape. Make sure that you have a light switch at the top of the stairs and the bottom of the stairs. If you do not have them, ask someone to add them for you. What else can I do to help prevent falls? Wear shoes that: Do not have high heels. Have rubber bottoms. Are comfortable and fit you well. Are closed at the toe. Do not wear sandals. If you use a stepladder: Make sure that it is fully opened. Do not climb a  closed stepladder. Make sure that both sides of the stepladder are locked into place. Ask someone to hold it for you, if possible. Clearly mark and make sure that you can see: Any grab bars or handrails. First and last steps. Where the edge of each step is. Use tools that help you move around (mobility aids) if they are needed. These include: Canes. Walkers. Scooters. Crutches. Turn on the lights when you go into a dark area. Replace any light bulbs as soon as they burn out. Set up your furniture so you have a clear path. Avoid moving your furniture around. If any of your floors are uneven, fix them. If there are any pets around you, be aware of where they are. Review your medicines with your doctor. Some medicines can make you feel dizzy. This can increase your chance of falling. Ask your doctor what other things that you can do to help prevent falls. This information is not intended to replace advice given to you by your health care provider. Make sure you discuss any questions you have with your health care provider. Document Released: 06/25/2009 Document Revised: 02/04/2016 Document Reviewed: 10/03/2014 Elsevier Interactive Patient Education  2017 [employer].

## 2022-12-16 NOTE — Progress Notes (Signed)
I connected with  Tracy [name]. on 12/16/22 by a audio enabled telemedicine application and verified that I am speaking with the correct person using two identifiers.  Patient Location: Home  Provider Location: Home Office  I discussed the limitations of evaluation and management by telemedicine. The patient expressed understanding and agreed to proceed.  Subjective:   Tracy [name]. is a 89 y.o. male who presents for Medicare Annual/Subsequent preventive examination.  Review of Systems    Cardiac Risk Factors include: advanced age (>59men, >41 women);diabetes mellitus;dyslipidemia;hypertension    Objective:    Today's Vitals   12/16/22 1039  BP: 124/68  Weight: 173 lb (78.5 kg)  Height: 5' 10.75" (1.797 m)   Body mass index is 24.3 kg/m.     12/16/2022   10:53 AM 12/14/2021   10:39 AM 11/17/2021    9:26 AM 11/17/2020    8:49 AM 11/14/2019    9:00 AM 11/09/2018    9:04 AM 08/13/2018    1:27 PM  Advanced Directives  Does Patient Have a Medical Advance Directive? Yes Yes Yes Yes Yes Yes Yes  Type of Advance Directive  Living will  Healthcare Power of [place];Living will Healthcare Power of [place];Living will Healthcare Power of [place];Living will Healthcare Power of [place];Living will  Copy of Healthcare Power of Attorney in Chart?    No - copy requested No - copy requested No - copy requested     Current Medications (verified) Outpatient Encounter Medications as of 12/16/2022  Medication Sig   Accu-Chek FastClix Lancets MISC CHECK  BLOOD  SUGAR TWICE DAILY   allopurinol (ZYLOPRIM) 300 MG tablet Take 1 tablet (300 mg total) by mouth daily.   amLODipine-benazepril (LOTREL) 10-40 MG capsule TAKE 1 CAPSULE EVERY DAY   aspirin 81 MG tablet Take 81 mg by mouth.   atorvastatin (LIPITOR) 10 MG tablet TAKE 1 TABLET DAILY AT 6 PM.   Blood Glucose Monitoring Suppl (ACCU-CHEK NANO SMARTVIEW) W/DEVICE KIT USE AS DIRECTED   Cholecalciferol (VITAMIN D) 2000 units tablet Take  2,000 Units by mouth daily.   diclofenac Sodium (VOLTAREN) 1 % GEL APPLY 4 GRAMS TOPICALLY 4 (FOUR) TIMES DAILY.   dorzolamide-timolol (COSOPT) 22.3-6.8 MG/ML ophthalmic solution Place 1 drop into the left eye daily at 2 PM.   doxazosin (CARDURA) 2 MG tablet Take 1 tablet (2 mg total) by mouth at bedtime.   finasteride (PROSCAR) 5 MG tablet TAKE 1 TABLET (5 MG TOTAL) BY MOUTH AT BEDTIME.   fluticasone (FLONASE) 50 MCG/ACT nasal spray 2 sprays daily.   glucose blood (COOL BLOOD GLUCOSE TEST STRIPS) test strip Use as instructed   insulin glargine (LANTUS SOLOSTAR) 100 UNIT/ML Solostar Pen Inject 46 Units into the skin daily.   Insulin Pen Needle (DROPLET PEN NEEDLES) 31G X 5 MM MISC USE AS NEEDED.   JARDIANCE 10 MG TABS tablet Take 1 tablet (10 mg total) by mouth daily.   Multiple Vitamins-Minerals (CENTRUM SILVER 50+MEN) TABS Take 1 tablet by mouth daily.   Omega-3 Fatty Acids (FISH OIL) 1000 MG CPDR Take 1 capsule by mouth 2 (two) times daily.   omeprazole (PRILOSEC) 40 MG capsule Take 1 capsule (40 mg total) by mouth daily.   polyethylene glycol powder (GLYCOLAX/MIRALAX) 17 GM/SCOOP powder Take 17 g by mouth daily.   prednisoLONE acetate (PRED FORTE) 1 % ophthalmic suspension Place into the right eye.   No facility-administered encounter medications on file as of 12/16/2022.    Allergies (verified) Tetracyclines & related   History:  Past Medical History:  Diagnosis Date   Arthritis    Cancer    SKIN CANCER melanoma 2 sites and multiple squamous cell   Diabetes mellitus without complication    GERD (gastroesophageal reflux disease)    Glaucoma    Hyperlipidemia    Hypertension    Myocardial infarction    08/27/1979   Vertigo    Past Surgical History:  Procedure Laterality Date   CATARACT EXTRACTION W/ INTRAOCULAR LENS  IMPLANT, BILATERAL     CIRCUMCISION     COLONOSCOPY WITH PROPOFOL N/A 11/17/2021   Procedure: COLONOSCOPY WITH PROPOFOL;  Surgeon: Wyline Mood, MD;  Location:  [facility] ENDOSCOPY;  Service: Gastroenterology;  Laterality: N/A;   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/10/2019   GANGLION CYST EXCISION     LEFT HAND   LUMBAR LAMINECTOMY/DECOMPRESSION MICRODISCECTOMY  09/13/2017   Procedure: BILATERAL Lumbar three-four , Left  Lumbar four-five  AND Left  L5-S1, LAMINECTOMY/FORAMINOTOMY;  Surgeon: Donalee Citrin, MD;  Location: [facility] OR;  Service: Neurosurgery;;   MELANOMA EXCISION  07/2011   TONSILLECTOMY     VASECTOMY     Family History  Problem Relation Age of Onset   Heart disease Mother    Diabetes Mother        type 2   Angina Mother    Heart disease Brother    Pneumonia Father    Heart attack Father    Alcoholism Daughter    Cirrhosis Daughter    Social History   Socioeconomic History   Marital status: Married    Spouse name: Harriett Sine   Number of children: 4   Years of education: Not on file   Highest education level: Bachelor's degree (e.g., BA, AB, BS)  Occupational History   Occupation: Retired  Tobacco Use   Smoking status: Former    Packs/day: 2.00    Years: 13.00    Additional pack years: 0.00    Total pack years: 26.00    Types: Cigarettes    Quit date: 1963    Years since quitting: 61.3   Smokeless tobacco: Never   Tobacco comments:    smoking cessation materials not required  Vaping Use   Vaping Use: Never used  Substance and Sexual Activity   Alcohol use: Yes    Alcohol/week: 2.0 standard drinks of alcohol    Types: 2 Shots of liquor per week    Comment: DAILY   Drug use: No   Sexual activity: Not Currently  Other Topics Concern   Not on file  Social History Narrative   Daughter Darl Pikes passed away 10/16/18.   Social Determinants of Health   Financial Resource Strain: Low Risk  (12/16/2022)   Overall Financial Resource Strain (CARDIA)    Difficulty of Paying Living Expenses: Not hard at all  Food Insecurity: No Food Insecurity (12/16/2022)   Hunger Vital Sign    Worried About Running Out of Food in the Last Year: Never true     Ran Out of Food in the Last Year: Never true  Transportation Needs: No Transportation Needs (12/16/2022)   PRAPARE - Administrator, Civil Service (Medical): No    Lack of Transportation (Non-Medical): No  Physical Activity: Insufficiently Active (12/16/2022)   Exercise Vital Sign    Days of Exercise per Week: 3 days    Minutes of Exercise per Session: 40 min  Stress: No Stress Concern Present (12/16/2022)   [employer] of Occupational Health - Occupational Stress Questionnaire    Feeling of Stress :  Not at all  Social Connections: Moderately Isolated (12/16/2022)   Social Connection and Isolation Panel [NHANES]    Frequency of Communication with Friends and Family: More than three times a week    Frequency of Social Gatherings with Friends and Family: Three times a week    Attends Religious Services: Never    Active Member of Clubs or Organizations: No    Attends BankerClub or Organization Meetings: Never    Marital Status: Married    Tobacco Counseling Counseling given: Not Answered Tobacco comments: smoking cessation materials not required   Clinical Intake:  Pre-visit preparation completed: Yes  Pain : No/denies pain     BMI - recorded: 24.3 Nutritional Status: BMI of 19-24  Normal Nutritional Risks: Nausea/ vomitting/ diarrhea (2 episodes in two weeks of nausea) Diabetes: Yes CBG done?: No Did pt. bring in CBG monitor from home?: No  How often do you need to have someone help you when you read instructions, pamphlets, or other written materials from your doctor or pharmacy?: 1 - Never  Diabetic?yes  Interpreter Needed?: No  Comments: lives with wife Information entered by :: B.Aruna Nestler,LPN   Activities of Daily Living    12/16/2022   10:53 AM 09/28/2022    9:21 AM  In your present state of health, do you have any difficulty performing the following activities:  Hearing? 1 1  Vision? 0 0  Difficulty concentrating or making decisions? 0 0  Walking or  climbing stairs? 0 0  Dressing or bathing? 0 0  Doing errands, shopping? 0 0  Preparing Food and eating ? N   Using the Toilet? N   In the past six months, have you accidently leaked urine? N   Do you have problems with loss of bowel control? N   Managing your Medications? N     Patient Care Team: Alba CorySowles, Krichna, MD as PCP - General (Family Medicine) Dasher, Cliffton Astersavid A, MD (Dermatology) Armstead PeaksHarris, Elizabeth H, MD as Referring Physician (Endocrinology) Linus SalmonsMcQueen, Chapman, MD (Otolaryngology) Elinor ParkinsonHyatt, Max T, [place] as Consulting Physician (Podiatry) Dingeldein, Viviann SpareSteven, MD (Ophthalmology) Wyline MoodAnna, Kiran, MD as Consulting Physician (Gastroenterology)  Indicate any recent Medical Services you may have received from other than [facility] providers in the past year (date may be approximate).     Assessment:   This is a routine wellness examination for [facility].  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       Depression Screen    12/16/2022   10:51 AM 09/28/2022    9:21 AM 08/22/2022   10:02 AM 03/29/2022    9:16 AM 12/14/2021   10:38 AM 11/15/2021    8:08 AM 09/22/2021    9:28 AM  PHQ 2/9 Scores  PHQ - 2 Score 0 0 0 0 0 0 0  PHQ- 9 Score  0 0  0 0 0    Fall Risk  12/16/2022   10:48 AM 09/28/2022    9:21 AM 08/22/2022   10:02 AM  03/29/2022    9:16 AM 12/14/2021   10:40 AM  Fall Risk   Falls in the past year? 1 1 1  0 0  Comment dec 23:going up stairs too fast      Number falls in past yr: 0 1 0 0 0  Injury with Fall? 0 0 1 0 0  Risk for fall due to : No Fall Risks No Fall Risks History of fall(s) No Fall Risks No Fall Risks  Follow up Education provided;Falls prevention discussed Falls prevention discussed Education provided;Falls prevention discussed;Falls evaluation completed Falls prevention discussed Falls prevention discussed    FALL RISK PREVENTION PERTAINING TO THE HOME:  Any stairs in or around the home? Yes  If so, are there any without handrails? Yes  Home free of loose throw rugs in walkways, pet beds, electrical cords, etc? Yes  Adequate lighting in your home to reduce risk of falls? Yes   ASSISTIVE DEVICES UTILIZED TO PREVENT FALLS:  Life alert? No  Use of a cane, walker or w/c? No  Grab bars in the bathroom? Yes  Shower chair or bench in shower? No  Elevated toilet seat or a handicapped toilet? Yes   Cognitive Function:        12/16/2022   10:57 AM 11/14/2019    9:02 AM 11/09/2018    9:11 AM 11/07/2017    9:09 AM 11/28/2016    2:07 PM  6CIT Screen  What Year? 0 points 0 points 0 points 0 points 0 points  What month? 0 points 0 points 0 points 0 points 0 points  What time? 0 points 0 points 0 points 0 points 0 points  Count back from 20 0 points 0 points 0 points 0 points 0 points  Months in reverse 0 points 0 points 0 points 0 points 0 points  Repeat phrase 0 points 0 points 0 points 2 points 0 points  Total Score 0 points 0 points 0 points 2 points 0 points    Immunizations Immunization History  Administered Date(s) Administered   Fluad Quad(high Dose 65+) 07/09/2019, 06/17/2020, 06/14/2021, 09/28/2022   Influenza, High Dose Seasonal PF 07/07/2014, 07/28/2016, 06/27/2017, 07/26/2018   Influenza, Seasonal, Injecte, Preservative Fre 07/16/2012   Influenza,inj,Quad PF,6+ Mos 06/18/2013,  06/30/2015   Influenza-Unspecified 07/13/2002, 07/21/2003, 07/12/2004, 06/12/2005, 06/26/2006, 07/18/2007, 07/13/2008, 07/22/2009, 07/16/2015   PFIZER(Purple Top)SARS-COV-2 Vaccination 09/27/2019, 10/18/2019, 06/08/2020   Pneumococcal Conjugate-13 07/07/2014   Pneumococcal Polysaccharide-23 07/13/2001, 07/26/2010   Tdap 11/28/2016   Zoster Recombinat (Shingrix) 12/02/2020, 06/07/2021    TDAP status: Up to date  Flu Vaccine status: Up to date  Pneumococcal vaccine status: Up to date  Covid-19 vaccine status: Completed vaccines  Qualifies for Shingles Vaccine? Yes   Zostavax completed Yes   Shingrix Completed?: Yes  Screening Tests Health Maintenance  Topic Date Due   COVID-19 Vaccine (4 - 2023-24 season) 05/13/2022   FOOT EXAM  11/23/2022   HEMOGLOBIN A1C  03/29/2023   INFLUENZA VACCINE  04/13/2023   OPHTHALMOLOGY EXAM  12/05/2023   Medicare Annual Wellness (AWV)  12/16/2023   DTaP/Tdap/Td (2 - Td or Tdap) 11/29/2026   Pneumonia Vaccine 765+ Years old  Completed   Zoster Vaccines- Shingrix  Completed   HPV VACCINES  Aged Out    Health Maintenance  Health Maintenance Due  Topic Date Due   COVID-19 Vaccine (4 - 2023-24 season) 05/13/2022   FOOT EXAM  11/23/2022  Colorectal cancer screening: No longer required.   Lung Cancer Screening: (Low Dose CT Chest recommended if Age 60-80 years, 30 pack-year currently smoking OR have quit w/in 15years.) does not qualify.   Lung Cancer Screening Referral: no  Additional Screening:  Hepatitis C Screening: does not qualify; Completed yes  Vision Screening: Recommended annual ophthalmology exams for early detection of glaucoma and other disorders of the eye. Is the patient up to date with their annual eye exam?  Yes  Who is the provider or what is the name of the office in which the patient attends annual eye exams? [facility] If pt is not established with a provider, would they like to be referred to a provider to establish  care? No .   Dental Screening: Recommended annual dental exams for proper oral hygiene  Community Resource Referral / Chronic Care Management: CRR required this visit?  No   CCM required this visit?  No      Plan:     I have personally reviewed and noted the following in the patient's chart:   Medical and social history Use of alcohol, tobacco or illicit drugs  Current medications and supplements including opioid prescriptions. Patient is not currently taking opioid prescriptions. Functional ability and status Nutritional status Physical activity Advanced directives List of other physicians Hospitalizations, surgeries, and ER visits in previous 12 months Vitals Screenings to include cognitive, depression, and falls Referrals and appointments  In addition, I have reviewed and discussed with patient certain preventive protocols, quality metrics, and best practice recommendations. A written personalized care plan for preventive services as well as general preventive health recommendations were provided to patient.     Sue Lush, LPN   03/20/2955   Nurse Notes: The patient states they are doing well and has no questions at this time. Pt does relay two incidents in the last two weeks of nausea when doing his stretching exercises. Pt has appt with PCP next month. Pt asked to keep log of any more incidents (what he is doing when occurs and before occurs). Also to keep the record of his Bps he takes to bring to his visit.

## 2022-12-30 ENCOUNTER — Other Ambulatory Visit: Payer: Self-pay | Admitting: Family Medicine

## 2022-12-30 DIAGNOSIS — E785 Hyperlipidemia, unspecified: Secondary | ICD-10-CM

## 2023-01-10 ENCOUNTER — Other Ambulatory Visit: Payer: Self-pay

## 2023-01-10 MED ORDER — TRUEDRAW LANCING DEVICE MISC: 1.0000 | Freq: Once | 0 refills | Status: AC

## 2023-01-10 NOTE — Telephone Encounter (Signed)
Left voicemail to confirm patient needs a new lancing device as [facility] sent over a prescription request on his behalf.

## 2023-01-26 DIAGNOSIS — C44222 Squamous cell carcinoma of skin of right ear and external auricular canal: Secondary | ICD-10-CM | POA: Diagnosis not present

## 2023-01-26 DIAGNOSIS — D0421 Carcinoma in situ of skin of right ear and external auricular canal: Secondary | ICD-10-CM | POA: Diagnosis not present

## 2023-01-26 NOTE — Progress Notes (Signed)
Name: Tracy [name].   MRN: [id number]    DOB: [birth year]   Date:01/27/2023       Progress Note  Subjective  Chief Complaint  Follow Up  HPI  GERD: we switched from Pepcid to PPI back in March 2021 ,he is currently on Omeprazole and doing well at this time. 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 He has been doing well since, denies chest pain, SOB or orthopnea. Denies decrease in exercise tolerance. But he feels tired and has been napping more than usual.   Change in bowel movements: he was seen by Dr. Tobi Bastos, had colonoscopy, he is now taking otc Clear Lax once a day and bowel movements back to normal and is doing well, with bowel movements once a day and smooth, bristol 4, he had some linzess 145 mg but it caused diarrhea    DMII: he sees Dr. Tiburcio Pea at [facility] usually once a year, taking Lantus 46 units and Jardiance, A1C today is down from 7.6 ^ to 7.3 % , he states his diet is balanced, has hypoglycemic episodes at most once a month and drinks orange juice. . He has dyslipidemia but LDL is at goal, CKI stage III and is taking Jardiance and ACE. Last urine micro was elevated Eye exam is up to date    Hyperlipidemia: taking medication and denies side effects, LDL has been at goal, triglycerides slightly elevated   HTN: he is compliant with his medications,  no chest pain or palpitation, no dizziness. BP is towards low end of normal , he feels tired , more than usual but still able to walk and go to the gym. We will stop Cardura today and he will return in 1 month to recheck BP , if still low we will adjust dose of Lotrel    Hearing loss sudden, happened after an URI back in Dec 2020  seen by Dr. Jenne Campus and had steroid and is using a hearing aid - but did not wear it today because it is bothersome in a smaller room. Stable    Osteoarthritis: he has OA of left thumb, intermittent medial knee pain when moving/walking  but responding to topical medication Taking Tylenol and voltaren ge prn. Stable   Gout: under control with allopurinol 300 mg daily ,reviewed results with patient    Senile purpura: on arms and it is stable , unchanged    History of skin cancer: he is compliant with dermatologist , had a procedure yesterday    BPH: sees Urologist, no nocturia, symptoms controlled. He is happy with medications - takes Cardura and finasteride  daily . He is 89  and we will not check PSA. BP is still low after we decreased dose of Cardura to 2 mg, we will stop it today   Patient Active Problem List   Diagnosis Date Noted   Coronary artery disease 03/17/2022   Hypertrophy foliate papillae 03/17/2022   Stage 3a chronic kidney disease (HCC) 09/22/2021   Numbness and tingling of both feet 10/23/2020   Chronic ischemic heart disease 10/19/2020   Hypertension associated with stage 3a chronic kidney disease due to type 2 diabetes mellitus (HCC) 10/19/2020   Anemia of chronic disease 11/12/2019   Hearing loss on right 11/26/2018   Labyrinthitis of right ear 10/25/2018   Spinal stenosis of lumbar region with neurogenic claudication 05/03/2017   Type 2 diabetes mellitus without complication, with  long-term current use of insulin (HCC) 05/03/2017   Left hip pain 07/28/2016   ALT (SGPT) level raised 07/13/2015   At risk for falling 07/13/2015   Arthritis of hand, degenerative 07/13/2015   Gout 07/13/2015   Glaucoma 07/13/2015   Diabetic neuropathy (HCC) 07/13/2015   Hypertension 04/09/2015   Type 2 diabetes mellitus with microalbuminuria, with long-term current use of insulin (HCC) 04/09/2015   Dyslipidemia 04/09/2015   Hypertriglyceridemia 04/09/2015   Benign fibroma of prostate 04/09/2015   Acid reflux 04/09/2015   History of melanoma excision 04/09/2015   History of squamous cell carcinoma 05/23/2013   H/O adenomatous polyp of colon 08/25/2010   History of MI (myocardial infarction) 08/27/1979    Past  Surgical History:  Procedure Laterality Date   CATARACT EXTRACTION W/ INTRAOCULAR LENS  IMPLANT, BILATERAL     CIRCUMCISION     COLONOSCOPY WITH PROPOFOL N/A 11/17/2021   Procedure: COLONOSCOPY WITH PROPOFOL;  Surgeon: Wyline Mood, MD;  Location: [facility] ENDOSCOPY;  Service: Gastroenterology;  Laterality: N/A;   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/10/2019   GANGLION CYST EXCISION     LEFT HAND   LUMBAR LAMINECTOMY/DECOMPRESSION MICRODISCECTOMY  09/13/2017   Procedure: BILATERAL Lumbar three-four , Left  Lumbar four-five  AND Left  L5-S1, LAMINECTOMY/FORAMINOTOMY;  Surgeon: Donalee Citrin, MD;  Location: [facility] OR;  Service: Neurosurgery;;   MELANOMA EXCISION  07/2011   TONSILLECTOMY     VASECTOMY      Family History  Problem Relation Age of Onset   Heart disease Mother    Diabetes Mother        type 2   Angina Mother    Heart disease Brother    Pneumonia Father    Heart attack Father    Alcoholism Daughter    Cirrhosis Daughter     Social History   Tobacco Use   Smoking status: Former    Packs/day: 2.00    Years: 13.00    Additional pack years: 0.00    Total pack years: 26.00    Types: Cigarettes    Quit date: 1963    Years since quitting: 61.4   Smokeless tobacco: Never   Tobacco comments:    smoking cessation materials not required  Substance Use Topics   Alcohol use: Yes    Alcohol/week: 2.0 standard drinks of alcohol    Types: 2 Shots of liquor per week    Comment: DAILY     Current Outpatient Medications:    Accu-Chek FastClix Lancets MISC, CHECK  BLOOD  SUGAR TWICE DAILY, Disp: 204 each, Rfl: 1   allopurinol (ZYLOPRIM) 300 MG tablet, Take 1 tablet (300 mg total) by mouth daily., Disp: 90 tablet, Rfl: 1   amLODipine-benazepril (LOTREL) 10-40 MG capsule, TAKE 1 CAPSULE EVERY DAY, Disp: 90 capsule, Rfl: 0   aspirin 81 MG tablet, Take 81 mg by mouth., Disp: , Rfl:    Blood Glucose Monitoring Suppl (ACCU-CHEK NANO SMARTVIEW) W/DEVICE KIT, USE AS DIRECTED, Disp: 1 kit, Rfl: 0    Cholecalciferol (VITAMIN D) 2000 units tablet, Take 2,000 Units by mouth daily., Disp: , Rfl:    cyanocobalamin 2000 MCG tablet, Take 2,000 mcg by mouth daily., Disp: , Rfl:    dorzolamide-timolol (COSOPT) 22.3-6.8 MG/ML ophthalmic solution, Place 1 drop into the left eye daily at 2 PM., Disp: , Rfl:    finasteride (PROSCAR) 5 MG tablet, TAKE 1 TABLET (5 MG TOTAL) BY MOUTH AT BEDTIME., Disp: 90 tablet, Rfl: 10   fluticasone (FLONASE) 50 MCG/ACT nasal spray, 2  sprays daily., Disp: , Rfl:    Insulin Pen Needle (DROPLET PEN NEEDLES) 31G X 5 MM MISC, USE AS NEEDED., Disp: 100 each, Rfl: 3   Lancet Devices (TRUEDRAW LANCING DEVICE) MISC, 1 each by Does not apply route once for 1 dose., Disp: 1 each, Rfl: 0   Multiple Vitamins-Minerals (CENTRUM SILVER 50+MEN) TABS, Take 1 tablet by mouth daily., Disp: , Rfl:    Omega-3 Fatty Acids (FISH OIL) 1000 MG CPDR, Take 1 capsule by mouth 2 (two) times daily., Disp: 60 capsule, Rfl: 0   polyethylene glycol powder (GLYCOLAX/MIRALAX) 17 GM/SCOOP powder, Take 17 g by mouth daily., Disp: 3350 g, Rfl: 1   prednisoLONE acetate (PRED FORTE) 1 % ophthalmic suspension, Place into the right eye., Disp: , Rfl:    atorvastatin (LIPITOR) 10 MG tablet, Take 1 tablet (10 mg total) by mouth daily., Disp: 90 tablet, Rfl: 1   insulin glargine (LANTUS SOLOSTAR) 100 UNIT/ML Solostar Pen, Inject 46 Units into the skin daily., Disp: 45 mL, Rfl: 1   JARDIANCE 10 MG TABS tablet, Take 1 tablet (10 mg total) by mouth daily., Disp: 90 tablet, Rfl: 1   omeprazole (PRILOSEC) 40 MG capsule, Take 1 capsule (40 mg total) by mouth daily., Disp: 90 capsule, Rfl: 1  Allergies  Allergen Reactions   Tetracyclines & Related Other (See Comments)    UNSPECIFIED REACTION  other    I personally reviewed active problem list, medication list, allergies, family history, social history, health maintenance with the patient/caregiver today.   ROS  Constitutional: Negative for fever , positive for mild   weight change.  Respiratory: Negative for cough and shortness of breath.   Cardiovascular: Negative for chest pain or palpitations.  Gastrointestinal: Negative for abdominal pain, no bowel changes.  Musculoskeletal: Negative for gait problem or joint swelling.  Skin: Negative for rash.  Neurological: Negative for dizziness or headache.  No other specific complaints in a complete review of systems (except as listed in HPI above).   Objective  Vitals:   01/27/23 0840  BP: 124/66  Pulse: 73  Resp: 16  SpO2: 96%  Weight: 169 lb (76.7 kg)  Height: 5\' 10"  (1.778 m)    Body mass index is 24.25 kg/m.  Physical Exam  Constitutional: Patient appears well-developed and well-nourished.  No distress.  HEENT: head atraumatic, normocephalic, pupils equal and reactive to light, neck supple Cardiovascular: Normal rate, regular rhythm and normal heart sounds.  No murmur heard. No BLE edema. Pulmonary/Chest: Effort normal and breath sounds normal. No respiratory distress. Abdominal: Soft.  There is no tenderness. Psychiatric: Patient has a normal mood and affect. behavior is normal. Judgment and thought content normal.   Recent Results (from the past 2160 hour(s))  HM DIABETES EYE EXAM     Status: None   Collection Time: 12/05/22 12:00 AM  Result Value Ref Range   HM Diabetic Eye Exam No Retinopathy No Retinopathy  POCT HgB A1C     Status: Abnormal   Collection Time: 01/27/23  8:43 AM  Result Value Ref Range   Hemoglobin A1C 7.3 (A) 4.0 - 5.6 %   HbA1c POC (<> result, manual entry)     HbA1c, POC (prediabetic range)     HbA1c, POC (controlled diabetic range)      Diabetic Foot Exam: Diabetic Foot Exam - Simple   Simple Foot Form Visual Inspection No deformities, no ulcerations, no other skin breakdown bilaterally: Yes Sensation Testing Intact to touch and monofilament testing bilaterally: Yes Pulse Check Posterior  Tibialis and Dorsalis pulse intact bilaterally: Yes Comments      PHQ2/9:    01/27/2023    8:39 AM 12/16/2022   10:51 AM 09/28/2022    9:21 AM 08/22/2022   10:02 AM 03/29/2022    9:16 AM  Depression screen PHQ 2/9  Decreased Interest 0 0 0 0 0  Down, Depressed, Hopeless 0 0 0 0 0  PHQ - 2 Score 0 0 0 0 0  Altered sleeping 0  0 0   Tired, decreased energy 0  0 0   Change in appetite 0  0 0   Feeling bad or failure about yourself  0  0 0   Trouble concentrating 0  0 0   Moving slowly or fidgety/restless 0  0 0   Suicidal thoughts 0  0 0   PHQ-9 Score 0  0 0     phq 9 is negative   Fall Risk:    01/27/2023    8:39 AM 12/16/2022   10:48 AM 09/28/2022    9:21 AM 08/22/2022   10:02 AM 03/29/2022    9:16 AM  Fall Risk   Falls in the past year? 1 1 1 1  0  Comment  dec 23:going up stairs too fast     Number falls in past yr: 0 0 1 0 0  Injury with Fall? 1 0 0 1 0  Risk for fall due to : No Fall Risks No Fall Risks No Fall Risks History of fall(s) No Fall Risks  Follow up Falls prevention discussed Education provided;Falls prevention discussed Falls prevention discussed Education provided;Falls prevention discussed;Falls evaluation completed Falls prevention discussed      Functional Status Survey: Is the patient deaf or have difficulty hearing?: Yes Does the patient have difficulty seeing, even when wearing glasses/contacts?: No Does the patient have difficulty concentrating, remembering, or making decisions?: No Does the patient have difficulty walking or climbing stairs?: Yes Does the patient have difficulty dressing or bathing?: No Does the patient have difficulty doing errands alone such as visiting a doctor's office or shopping?: No    Assessment & Plan  1. Controlled type 2 diabetes mellitus with microalbuminuria, with long-term current use of insulin (HCC)  - HM Diabetes Foot Exam - JARDIANCE 10 MG TABS tablet; Take 1 tablet (10 mg total) by mouth daily.  Dispense: 90 tablet; Refill: 1  2. Senile purpura (HCC)  Reassurance  given   3. Hypertension associated with stage 3a chronic kidney disease due to type 2 diabetes mellitus (HCC)  BP is low , we will stop doxazosin  - POCT HgB A1C  4. Stage 3a chronic kidney disease (HCC)  Continue Jardiance and ACE  5. GERD without esophagitis  - omeprazole (PRILOSEC) 40 MG capsule; Take 1 capsule (40 mg total) by mouth daily.  Dispense: 90 capsule; Refill: 1  6. Controlled gout  Doing well, last uric acid at goal   7. BPH associated with nocturia  Stable, we will stop Cardura due to low bp   8. Anemia of chronic disease   9. History of MI (myocardial infarction)   10. Dyslipidemia associated with type 2 diabetes mellitus (HCC)  - atorvastatin (LIPITOR) 10 MG tablet; Take 1 tablet (10 mg total) by mouth daily.  Dispense: 90 tablet; Refill: 1 - insulin glargine (LANTUS SOLOSTAR) 100 UNIT/ML Solostar Pen; Inject 46 Units into the skin daily.  Dispense: 45 mL; Refill: 1

## 2023-01-27 ENCOUNTER — Encounter: Payer: Self-pay | Admitting: Family Medicine

## 2023-01-27 ENCOUNTER — Ambulatory Visit (INDEPENDENT_AMBULATORY_CARE_PROVIDER_SITE_OTHER): Payer: Medicare HMO | Admitting: Family Medicine

## 2023-01-27 DIAGNOSIS — K219 Gastro-esophageal reflux disease without esophagitis: Secondary | ICD-10-CM

## 2023-01-27 DIAGNOSIS — D692 Other nonthrombocytopenic purpura: Secondary | ICD-10-CM | POA: Diagnosis not present

## 2023-01-27 DIAGNOSIS — I129 Hypertensive chronic kidney disease with stage 1 through stage 4 chronic kidney disease, or unspecified chronic kidney disease: Secondary | ICD-10-CM | POA: Diagnosis not present

## 2023-01-27 DIAGNOSIS — E785 Hyperlipidemia, unspecified: Secondary | ICD-10-CM

## 2023-01-27 DIAGNOSIS — M109 Gout, unspecified: Secondary | ICD-10-CM | POA: Diagnosis not present

## 2023-01-27 DIAGNOSIS — N401 Enlarged prostate with lower urinary tract symptoms: Secondary | ICD-10-CM

## 2023-01-27 DIAGNOSIS — I252 Old myocardial infarction: Secondary | ICD-10-CM | POA: Diagnosis not present

## 2023-01-27 DIAGNOSIS — E1122 Type 2 diabetes mellitus with diabetic chronic kidney disease: Secondary | ICD-10-CM

## 2023-01-27 DIAGNOSIS — E1169 Type 2 diabetes mellitus with other specified complication: Secondary | ICD-10-CM

## 2023-01-27 DIAGNOSIS — N1831 Chronic kidney disease, stage 3a: Secondary | ICD-10-CM | POA: Diagnosis not present

## 2023-01-27 DIAGNOSIS — D638 Anemia in other chronic diseases classified elsewhere: Secondary | ICD-10-CM

## 2023-01-27 DIAGNOSIS — Z794 Long term (current) use of insulin: Secondary | ICD-10-CM

## 2023-01-27 DIAGNOSIS — E1129 Type 2 diabetes mellitus with other diabetic kidney complication: Secondary | ICD-10-CM | POA: Diagnosis not present

## 2023-01-27 LAB — POCT GLYCOSYLATED HEMOGLOBIN (HGB A1C): Hemoglobin A1C: 7.3 % — AB (ref 4.0–5.6)

## 2023-01-27 MED ORDER — ATORVASTATIN CALCIUM 10 MG PO TABS: 10.0000 mg | ORAL_TABLET | Freq: Every day | ORAL | 1 refills | Status: DC

## 2023-01-27 MED ORDER — JARDIANCE 10 MG PO TABS: 10.0000 mg | ORAL_TABLET | Freq: Every day | ORAL | 1 refills | Status: DC

## 2023-01-27 MED ORDER — OMEPRAZOLE 40 MG PO CPDR: 40.0000 mg | DELAYED_RELEASE_CAPSULE | Freq: Every day | ORAL | 1 refills | Status: DC

## 2023-01-27 MED ORDER — LANTUS SOLOSTAR 100 UNIT/ML ~~LOC~~ SOPN: 46.0000 [IU] | PEN_INJECTOR | Freq: Every day | SUBCUTANEOUS | 1 refills | Status: DC

## 2023-02-27 ENCOUNTER — Ambulatory Visit: Payer: Medicare HMO

## 2023-02-27 ENCOUNTER — Telehealth: Payer: Self-pay

## 2023-02-27 NOTE — Telephone Encounter (Signed)
Patient aware.

## 2023-02-27 NOTE — Progress Notes (Signed)
Patient presented for BP check only. Bp was obtained and patient's recordings of home testings were copied for chart.

## 2023-03-02 ENCOUNTER — Other Ambulatory Visit: Payer: Self-pay | Admitting: Family Medicine

## 2023-03-02 DIAGNOSIS — E1122 Type 2 diabetes mellitus with diabetic chronic kidney disease: Secondary | ICD-10-CM

## 2023-03-28 DIAGNOSIS — H903 Sensorineural hearing loss, bilateral: Secondary | ICD-10-CM | POA: Diagnosis not present

## 2023-03-28 DIAGNOSIS — H6123 Impacted cerumen, bilateral: Secondary | ICD-10-CM | POA: Diagnosis not present

## 2023-04-27 DIAGNOSIS — E160 Drug-induced hypoglycemia without coma: Secondary | ICD-10-CM | POA: Diagnosis not present

## 2023-04-27 DIAGNOSIS — Z794 Long term (current) use of insulin: Secondary | ICD-10-CM | POA: Diagnosis not present

## 2023-04-27 DIAGNOSIS — R809 Proteinuria, unspecified: Secondary | ICD-10-CM | POA: Diagnosis not present

## 2023-04-27 DIAGNOSIS — I10 Essential (primary) hypertension: Secondary | ICD-10-CM | POA: Diagnosis not present

## 2023-04-27 DIAGNOSIS — T383X5A Adverse effect of insulin and oral hypoglycemic [antidiabetic] drugs, initial encounter: Secondary | ICD-10-CM | POA: Diagnosis not present

## 2023-04-27 DIAGNOSIS — E1129 Type 2 diabetes mellitus with other diabetic kidney complication: Secondary | ICD-10-CM | POA: Diagnosis not present

## 2023-05-03 ENCOUNTER — Encounter: Payer: Self-pay | Admitting: Family Medicine

## 2023-05-03 ENCOUNTER — Ambulatory Visit (INDEPENDENT_AMBULATORY_CARE_PROVIDER_SITE_OTHER): Payer: Medicare HMO | Admitting: Family Medicine

## 2023-05-03 VITALS — BP 122/66 | HR 77 | Temp 97.8°F | Resp 16 | Ht 71.0 in | Wt 166.2 lb

## 2023-05-03 DIAGNOSIS — E1129 Type 2 diabetes mellitus with other diabetic kidney complication: Secondary | ICD-10-CM | POA: Diagnosis not present

## 2023-05-03 DIAGNOSIS — Z794 Long term (current) use of insulin: Secondary | ICD-10-CM | POA: Diagnosis not present

## 2023-05-03 DIAGNOSIS — R809 Proteinuria, unspecified: Secondary | ICD-10-CM

## 2023-05-03 DIAGNOSIS — L02818 Cutaneous abscess of other sites: Secondary | ICD-10-CM

## 2023-05-03 MED ORDER — DOXYCYCLINE HYCLATE 100 MG PO TABS: 100.0000 mg | ORAL_TABLET | Freq: Two times a day (BID) | ORAL | 0 refills | Status: DC

## 2023-05-03 NOTE — Progress Notes (Signed)
Name: Tracy [name].   MRN: [id number]    DOB: 01-Apr-1930   Date:05/03/2023       Progress Note  Subjective  Chief Complaint  Possible Infection  HPI  Patient saw Endo and they applied a Dexcom device to his left posterior upper arm on 08/15, the glucose reading were not correct, he called Dexcom on 08/18 and was instructed to remove the device. He noticed a tender bump on the area yesterday and today it was draining a little bit of pus. No fever or chills. Some redness around the area. Glucose has been well controlled, this am it was 120. Normal appetite, no systemic problems.   Patient Active Problem List   Diagnosis Date Noted   Coronary artery disease 03/17/2022   Hypertrophy foliate papillae 03/17/2022   Stage 3a chronic kidney disease (HCC) 09/22/2021   Numbness and tingling of both feet 10/23/2020   Chronic ischemic heart disease 10/19/2020   Hypertension associated with stage 3a chronic kidney disease due to type 2 diabetes mellitus (HCC) 10/19/2020   Anemia of chronic disease 11/12/2019   Hearing loss on right 11/26/2018   Labyrinthitis of right ear 10/25/2018   Spinal stenosis of lumbar region with neurogenic claudication 05/03/2017   Type 2 diabetes mellitus without complication, with long-term current use of insulin (HCC) 05/03/2017   Left hip pain 07/28/2016   ALT (SGPT) level raised 07/13/2015   At risk for falling 07/13/2015   Arthritis of hand, degenerative 07/13/2015   Gout 07/13/2015   Glaucoma 07/13/2015   Diabetic neuropathy (HCC) 07/13/2015   Hypertension 04/09/2015   Type 2 diabetes mellitus with microalbuminuria, with long-term current use of insulin (HCC) 04/09/2015   Dyslipidemia 04/09/2015   Hypertriglyceridemia 04/09/2015   Benign fibroma of prostate 04/09/2015   Acid reflux 04/09/2015   History of melanoma excision 04/09/2015   History of squamous cell carcinoma 05/23/2013   H/O adenomatous polyp of colon 08/25/2010   History of MI  (myocardial infarction) 08/27/1979    Past Surgical History:  Procedure Laterality Date   CATARACT EXTRACTION W/ INTRAOCULAR LENS  IMPLANT, BILATERAL     CIRCUMCISION     COLONOSCOPY WITH PROPOFOL N/A 11/17/2021   Procedure: COLONOSCOPY WITH PROPOFOL;  Surgeon: Wyline Mood, MD;  Location: [facility] ENDOSCOPY;  Service: Gastroenterology;  Laterality: N/A;   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/10/2019   GANGLION CYST EXCISION     LEFT HAND   LUMBAR LAMINECTOMY/DECOMPRESSION MICRODISCECTOMY  09/13/2017   Procedure: BILATERAL Lumbar three-four , Left  Lumbar four-five  AND Left  L5-S1, LAMINECTOMY/FORAMINOTOMY;  Surgeon: Donalee Citrin, MD;  Location: [facility] OR;  Service: Neurosurgery;;   MELANOMA EXCISION  07/2011   TONSILLECTOMY     VASECTOMY      Family History  Problem Relation Age of Onset   Heart disease Mother    Diabetes Mother        type 2   Angina Mother    Heart disease Brother    Pneumonia Father    Heart attack Father    Alcoholism Daughter    Cirrhosis Daughter     Social History   Tobacco Use   Smoking status: Former    Current packs/day: 0.00    Average packs/day: 2.0 packs/day for 13.0 years (26.0 ttl pk-yrs)    Types: Cigarettes    Start date: 61    Quit date: 1963    Years since quitting: 61.6   Smokeless tobacco: Never   Tobacco comments:    smoking cessation materials  not required  Substance Use Topics   Alcohol use: Yes    Alcohol/week: 2.0 standard drinks of alcohol    Types: 2 Shots of liquor per week    Comment: DAILY     Current Outpatient Medications:    Accu-Chek FastClix Lancets MISC, CHECK  BLOOD  SUGAR TWICE DAILY, Disp: 204 each, Rfl: 1   allopurinol (ZYLOPRIM) 300 MG tablet, Take 1 tablet (300 mg total) by mouth daily., Disp: 90 tablet, Rfl: 1   amLODipine-benazepril (LOTREL) 10-40 MG capsule, TAKE 1 CAPSULE EVERY DAY, Disp: 90 capsule, Rfl: 0   aspirin 81 MG tablet, Take 81 mg by mouth., Disp: , Rfl:    atorvastatin (LIPITOR) 10 MG tablet, Take 1  tablet (10 mg total) by mouth daily., Disp: 90 tablet, Rfl: 1   Blood Glucose Monitoring Suppl (ACCU-CHEK NANO SMARTVIEW) W/DEVICE KIT, USE AS DIRECTED, Disp: 1 kit, Rfl: 0   Cholecalciferol (VITAMIN D) 2000 units tablet, Take 2,000 Units by mouth daily., Disp: , Rfl:    cyanocobalamin 2000 MCG tablet, Take 2,000 mcg by mouth daily., Disp: , Rfl:    dorzolamide-timolol (COSOPT) 22.3-6.8 MG/ML ophthalmic solution, Place 1 drop into the left eye daily at 2 PM., Disp: , Rfl:    doxycycline (VIBRA-TABS) 100 MG tablet, Take 1 tablet (100 mg total) by mouth 2 (two) times daily., Disp: 14 tablet, Rfl: 0   finasteride (PROSCAR) 5 MG tablet, TAKE 1 TABLET (5 MG TOTAL) BY MOUTH AT BEDTIME., Disp: 90 tablet, Rfl: 10   fluticasone (FLONASE) 50 MCG/ACT nasal spray, 2 sprays daily., Disp: , Rfl:    insulin glargine (LANTUS SOLOSTAR) 100 UNIT/ML Solostar Pen, Inject 46 Units into the skin daily., Disp: 45 mL, Rfl: 1   Insulin Pen Needle (DROPLET PEN NEEDLES) 31G X 5 MM MISC, USE AS NEEDED., Disp: 100 each, Rfl: 3   JARDIANCE 10 MG TABS tablet, Take 1 tablet (10 mg total) by mouth daily., Disp: 90 tablet, Rfl: 1   Lancet Devices (TRUEDRAW LANCING DEVICE) MISC, 1 each by Does not apply route once for 1 dose., Disp: 1 each, Rfl: 0   Multiple Vitamins-Minerals (CENTRUM SILVER 50+MEN) TABS, Take 1 tablet by mouth daily., Disp: , Rfl:    Omega-3 Fatty Acids (FISH OIL) 1000 MG CPDR, Take 1 capsule by mouth 2 (two) times daily., Disp: 60 capsule, Rfl: 0   omeprazole (PRILOSEC) 40 MG capsule, Take 1 capsule (40 mg total) by mouth daily., Disp: 90 capsule, Rfl: 1   polyethylene glycol powder (GLYCOLAX/MIRALAX) 17 GM/SCOOP powder, Take 17 g by mouth daily., Disp: 3350 g, Rfl: 1   prednisoLONE acetate (PRED FORTE) 1 % ophthalmic suspension, Place into the right eye., Disp: , Rfl:   Allergies  Allergen Reactions   Tetracyclines & Related Other (See Comments)    UNSPECIFIED REACTION  other    I personally reviewed  active problem list, medication list, allergies, family history, social history, health maintenance with the patient/caregiver today.   ROS  Ten systems reviewed and is negative except as mentioned in HPI    Objective  Vitals:   05/03/23 1012  BP: 122/66  Pulse: 77  Resp: 16  Temp: 97.8 F (36.6 C)  TempSrc: Oral  SpO2: 97%  Weight: 166 lb 3.2 oz (75.4 kg)  Height: 5\' 11"  (1.803 m)    Body mass index is 23.18 kg/m.  Physical Exam  Constitutional: Patient appears well-developed and well-nourished. Obese  No distress.  Cardiovascular: Normal rate, regular rhythm and normal heart sounds.  No murmur heard. No BLE edema. Skin: abscess on posterior upper arm, able to squeeze purulent discharge when squeezed the area, about a quarter in size, some erythema surrounding induration.  Pulmonary/Chest: Effort normal and breath sounds normal. No respiratory distress.Psychiatric: Patient has a normal mood and affect. behavior is normal. Judgment and thought content normal.    PHQ2/9:    05/03/2023   10:16 AM 01/27/2023    8:39 AM 12/16/2022   10:51 AM 09/28/2022    9:21 AM 08/22/2022   10:02 AM  Depression screen PHQ 2/9  Decreased Interest 0 0 0 0 0  Down, Depressed, Hopeless 0 0 0 0 0  PHQ - 2 Score 0 0 0 0 0  Altered sleeping 0 0  0 0  Tired, decreased energy 0 0  0 0  Change in appetite 0 0  0 0  Feeling bad or failure about yourself  0 0  0 0  Trouble concentrating 0 0  0 0  Moving slowly or fidgety/restless 0 0  0 0  Suicidal thoughts 0 0  0 0  PHQ-9 Score 0 0  0 0    phq 9 is negative   Fall Risk:    05/03/2023   10:16 AM 01/27/2023    8:39 AM 12/16/2022   10:48 AM 09/28/2022    9:21 AM 08/22/2022   10:02 AM  Fall Risk   Falls in the past year? 1 1 1 1 1   Comment   dec 23:going up stairs too fast    Number falls in past yr: 0 0 0 1 0  Injury with Fall? 0 1 0 0 1  Risk for fall due to : History of fall(s) No Fall Risks No Fall Risks No Fall Risks History of fall(s)   Follow up Falls prevention discussed;Falls evaluation completed;Education provided Falls prevention discussed Education provided;Falls prevention discussed Falls prevention discussed Education provided;Falls prevention discussed;Falls evaluation completed     Assessment & Plan  1. Cutaneous abscess of other site  - doxycycline (VIBRA-TABS) 100 MG tablet; Take 1 tablet (100 mg total) by mouth 2 (two) times daily.  Dispense: 14 tablet; Refill: 0  It says allergic to tetracycline, it was an eye drop that he used many years ago and eyes were red.   Culture sent to lab   2. Controlled type 2 diabetes mellitus with microalbuminuria, with long-term current use of insulin (HCC)  Glucose is at goal

## 2023-05-06 LAB — AEROBIC CULTURE
MICRO NUMBER:: 15363799
SPECIMEN QUALITY:: ADEQUATE

## 2023-05-22 DIAGNOSIS — Z85820 Personal history of malignant melanoma of skin: Secondary | ICD-10-CM | POA: Diagnosis not present

## 2023-05-22 DIAGNOSIS — C44319 Basal cell carcinoma of skin of other parts of face: Secondary | ICD-10-CM | POA: Diagnosis not present

## 2023-05-22 DIAGNOSIS — D2262 Melanocytic nevi of left upper limb, including shoulder: Secondary | ICD-10-CM | POA: Diagnosis not present

## 2023-05-22 DIAGNOSIS — D2271 Melanocytic nevi of right lower limb, including hip: Secondary | ICD-10-CM | POA: Diagnosis not present

## 2023-05-22 DIAGNOSIS — L258 Unspecified contact dermatitis due to other agents: Secondary | ICD-10-CM | POA: Diagnosis not present

## 2023-05-22 DIAGNOSIS — D2261 Melanocytic nevi of right upper limb, including shoulder: Secondary | ICD-10-CM | POA: Diagnosis not present

## 2023-05-22 DIAGNOSIS — Z85828 Personal history of other malignant neoplasm of skin: Secondary | ICD-10-CM | POA: Diagnosis not present

## 2023-05-22 DIAGNOSIS — D2272 Melanocytic nevi of left lower limb, including hip: Secondary | ICD-10-CM | POA: Diagnosis not present

## 2023-05-22 DIAGNOSIS — L309 Dermatitis, unspecified: Secondary | ICD-10-CM | POA: Diagnosis not present

## 2023-05-22 DIAGNOSIS — D225 Melanocytic nevi of trunk: Secondary | ICD-10-CM | POA: Diagnosis not present

## 2023-05-22 DIAGNOSIS — D485 Neoplasm of uncertain behavior of skin: Secondary | ICD-10-CM | POA: Diagnosis not present

## 2023-05-23 ENCOUNTER — Other Ambulatory Visit: Payer: Self-pay | Admitting: Family Medicine

## 2023-05-23 DIAGNOSIS — M109 Gout, unspecified: Secondary | ICD-10-CM

## 2023-05-26 NOTE — Progress Notes (Unsigned)
Name: Tracy [name].   MRN: [id number]    DOB: 1930/04/19   Date:05/29/2023       Progress Note  Subjective  Chief Complaint  Follow Up  HPI  GERD: we switched from Pepcid to PPI back in March 2021 ,he is currently on Omeprazole and symptoms are controlled  Rash: he is under the care of Dermatologist had a biopsy done last week,  Dermatologist thinks it may be an allergic reaction and since he started taking a collagen supplement about 90 days ago rash started and he will try to stop taking that. Discussed plant base protein instead.    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 He has been doing well since, denies chest pain, SOB or orthopnea. He is feeling back to normal, no longer feeling tired like last year    Change in bowel movements: he was seen by Dr. Tobi Bastos, had colonoscopy, he is now taking otc Clear Lax twice a week now and bowel movements are  daily , Bristol scale 5   DMII: he sees Dr. Tiburcio Pea at [facility] usually once a year, taking Lantus and is down to 42  units and Jardiance, A1C today is down from 7.6 to 7.3 % and today is 6.8 %, he states his diet is balanced, has hypoglycemic episodes at most once a month and drinks orange juice. He has dyslipidemia but LDL is at goal, CKI stage III and is taking Jardiance and ACE. Last urine micro was elevated Eye exam is up to date . He has hypoglycemic episodes about once a month at most.    Hyperlipidemia: taking medication and denies side effects, LDL has been at goal  HTN: he is compliant with his medications,  no chest pain or palpitation, no dizziness. BP is towards low end of normal , he feels tired , more than usual but still able to walk and go to the gym. We stopped Cardura, bp today still towards low end of normal but higher at home, he will bring his monitor for calibration. Goal bp for him is around 130-140 systolic. He gets a little dizzy when standing up after doing  stretching exercises on the floor    Hearing loss sudden, happened after an URI back in Dec 2020  seen by Dr. Jenne Campus and had steroid and is wearing his hearing aids    Osteoarthritis: he has OA of left thumb, intermittent medial knee pain when moving/walking but responding to topical medication Taking Tylenol and voltaren ge prn. Unchanged   Gout: under control with allopurinol 300 mg daily ,reviewed results with patient    Senile purpura: on arms and it is stable , reassurance given    History of skin cancer: he is compliant with dermatologist , had a procedure on face recently    BPH: sees Urologist, no nocturia, symptoms controlled. He is happy with medications - currently on Finasteride only but not change . He is 89  and we will not check PSA. He is off Cardura due to low bp   Patient Active Problem List   Diagnosis Date Noted   Coronary artery disease 03/17/2022   Hypertrophy foliate papillae 03/17/2022   Stage 3a chronic kidney disease (HCC) 09/22/2021   Numbness and tingling of both feet 10/23/2020   Chronic ischemic heart disease 10/19/2020   Hypertension associated with stage 3a chronic kidney disease due to type 2 diabetes mellitus (HCC) 10/19/2020   Anemia  of chronic disease 11/12/2019   Hearing loss on right 11/26/2018   Labyrinthitis of right ear 10/25/2018   Spinal stenosis of lumbar region with neurogenic claudication 05/03/2017   Type 2 diabetes mellitus without complication, with long-term current use of insulin (HCC) 05/03/2017   Left hip pain 07/28/2016   ALT (SGPT) level raised 07/13/2015   At risk for falling 07/13/2015   Arthritis of hand, degenerative 07/13/2015   Gout 07/13/2015   Glaucoma 07/13/2015   Diabetic neuropathy (HCC) 07/13/2015   Hypertension 04/09/2015   Type 2 diabetes mellitus with microalbuminuria, with long-term current use of insulin (HCC) 04/09/2015   Dyslipidemia 04/09/2015   Hypertriglyceridemia 04/09/2015   Benign fibroma of  prostate 04/09/2015   Acid reflux 04/09/2015   History of melanoma excision 04/09/2015   History of squamous cell carcinoma 05/23/2013   H/O adenomatous polyp of colon 08/25/2010   History of MI (myocardial infarction) 08/27/1979    Past Surgical History:  Procedure Laterality Date   CATARACT EXTRACTION W/ INTRAOCULAR LENS  IMPLANT, BILATERAL     CIRCUMCISION     COLONOSCOPY WITH PROPOFOL N/A 11/17/2021   Procedure: COLONOSCOPY WITH PROPOFOL;  Surgeon: Wyline Mood, MD;  Location: [facility] ENDOSCOPY;  Service: Gastroenterology;  Laterality: N/A;   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/10/2019   GANGLION CYST EXCISION     LEFT HAND   LUMBAR LAMINECTOMY/DECOMPRESSION MICRODISCECTOMY  09/13/2017   Procedure: BILATERAL Lumbar three-four , Left  Lumbar four-five  AND Left  L5-S1, LAMINECTOMY/FORAMINOTOMY;  Surgeon: Donalee Citrin, MD;  Location: [facility] OR;  Service: Neurosurgery;;   MELANOMA EXCISION  07/2011   TONSILLECTOMY     VASECTOMY      Family History  Problem Relation Age of Onset   Heart disease Mother    Diabetes Mother        type 2   Angina Mother    Heart disease Brother    Pneumonia Father    Heart attack Father    Alcoholism Daughter    Cirrhosis Daughter     Social History   Tobacco Use   Smoking status: Former    Current packs/day: 0.00    Average packs/day: 2.0 packs/day for 13.0 years (26.0 ttl pk-yrs)    Types: Cigarettes    Start date: 29    Quit date: 1963    Years since quitting: 61.7   Smokeless tobacco: Never   Tobacco comments:    smoking cessation materials not required  Substance Use Topics   Alcohol use: Yes    Alcohol/week: 2.0 standard drinks of alcohol    Types: 2 Shots of liquor per week    Comment: DAILY     Current Outpatient Medications:    Accu-Chek FastClix Lancets MISC, CHECK  BLOOD  SUGAR TWICE DAILY, Disp: 204 each, Rfl: 1   allopurinol (ZYLOPRIM) 300 MG tablet, TAKE 1 TABLET EVERY DAY, Disp: 90 tablet, Rfl: 3   aspirin 81 MG tablet, Take 81 mg  by mouth., Disp: , Rfl:    atorvastatin (LIPITOR) 10 MG tablet, Take 1 tablet (10 mg total) by mouth daily., Disp: 90 tablet, Rfl: 1   Blood Glucose Monitoring Suppl (ACCU-CHEK NANO SMARTVIEW) W/DEVICE KIT, USE AS DIRECTED, Disp: 1 kit, Rfl: 0   Cholecalciferol (VITAMIN D) 2000 units tablet, Take 2,000 Units by mouth daily., Disp: , Rfl:    clobetasol ointment (TEMOVATE) 0.05 %, Apply topically 2 (two) times daily., Disp: , Rfl:    cyanocobalamin 2000 MCG tablet, Take 2,000 mcg by mouth daily., Disp: ,  Rfl:    dorzolamide-timolol (COSOPT) 22.3-6.8 MG/ML ophthalmic solution, Place 1 drop into the left eye daily at 2 PM., Disp: , Rfl:    fluticasone (FLONASE) 50 MCG/ACT nasal spray, 2 sprays daily., Disp: , Rfl:    insulin glargine (LANTUS SOLOSTAR) 100 UNIT/ML Solostar Pen, Inject 46 Units into the skin daily., Disp: 45 mL, Rfl: 1   Insulin Pen Needle (DROPLET PEN NEEDLES) 31G X 5 MM MISC, USE AS NEEDED., Disp: 100 each, Rfl: 3   JARDIANCE 10 MG TABS tablet, Take 1 tablet (10 mg total) by mouth daily., Disp: 90 tablet, Rfl: 1   Lancet Devices (TRUEDRAW LANCING DEVICE) MISC, 1 each by Does not apply route once for 1 dose., Disp: 1 each, Rfl: 0   Multiple Vitamins-Minerals (CENTRUM SILVER 50+MEN) TABS, Take 1 tablet by mouth daily., Disp: , Rfl:    Omega-3 Fatty Acids (FISH OIL) 1000 MG CPDR, Take 1 capsule by mouth 2 (two) times daily., Disp: 60 capsule, Rfl: 0   omeprazole (PRILOSEC) 40 MG capsule, Take 1 capsule (40 mg total) by mouth daily., Disp: 90 capsule, Rfl: 1   polyethylene glycol powder (GLYCOLAX/MIRALAX) 17 GM/SCOOP powder, Take 17 g by mouth daily., Disp: 3350 g, Rfl: 1   prednisoLONE acetate (PRED FORTE) 1 % ophthalmic suspension, Place into the right eye., Disp: , Rfl:    amLODipine-benazepril (LOTREL) 10-40 MG capsule, Take 1 capsule by mouth daily., Disp: 90 capsule, Rfl: 1   finasteride (PROSCAR) 5 MG tablet, Take 1 tablet (5 mg total) by mouth at bedtime., Disp: 90 tablet, Rfl:  1  Allergies  Allergen Reactions   Tetracyclines & Related Other (See Comments)    UNSPECIFIED REACTION  other    I personally reviewed active problem list, medication list, allergies, family history, social history, health maintenance with the patient/caregiver today.   ROS  Ten systems reviewed and is negative except as mentioned in HPI    Objective  Vitals:   05/29/23 0759  BP: 122/66  Pulse: 82  Resp: 16  Temp: 97.7 F (36.5 C)  TempSrc: Oral  SpO2: 94%  Weight: 170 lb 9.6 oz (77.4 kg)  Height: 5\' 11"  (1.803 m)    Body mass index is 23.79 kg/m.  Physical Exam  Constitutional: Patient appears well-developed and well-nourished. No distress.  HEENT: head atraumatic, normocephalic, pupils equal and reactive to light, neck supple Cardiovascular: Normal rate, regular rhythm and normal heart sounds.  No murmur heard. No BLE edema. Pulmonary/Chest: Effort normal and breath sounds normal. No respiratory distress. Skin: erythematous rash, clear in the center Abdominal: Soft.  There is no tenderness. Psychiatric: Patient has a normal mood and affect. behavior is normal. Judgment and thought content normal.    PHQ2/9:    05/29/2023    8:02 AM 05/03/2023   10:16 AM 01/27/2023    8:39 AM 12/16/2022   10:51 AM 09/28/2022    9:21 AM  Depression screen PHQ 2/9  Decreased Interest 0 0 0 0 0  Down, Depressed, Hopeless 0 0 0 0 0  PHQ - 2 Score 0 0 0 0 0  Altered sleeping 0 0 0  0  Tired, decreased energy 0 0 0  0  Change in appetite 0 0 0  0  Feeling bad or failure about yourself  0 0 0  0  Trouble concentrating 0 0 0  0  Moving slowly or fidgety/restless 0 0 0  0  Suicidal thoughts 0 0 0  0  PHQ-9 Score 0 0  0  0    phq 9 is negative   Fall Risk:    05/29/2023    8:02 AM 05/03/2023   10:16 AM 01/27/2023    8:39 AM 12/16/2022   10:48 AM 09/28/2022    9:21 AM  Fall Risk   Falls in the past year? 1 1 1 1 1   Comment    dec 23:going up stairs too fast   Number falls in  past yr: 0 0 0 0 1  Injury with Fall? 0 0 1 0 0  Risk for fall due to : History of fall(s) History of fall(s) No Fall Risks No Fall Risks No Fall Risks  Follow up Education provided;Falls evaluation completed;Falls prevention discussed Falls prevention discussed;Falls evaluation completed;Education provided Falls prevention discussed Education provided;Falls prevention discussed Falls prevention discussed      Functional Status Survey: Is the patient deaf or have difficulty hearing?: Yes Does the patient have difficulty seeing, even when wearing glasses/contacts?: No Does the patient have difficulty concentrating, remembering, or making decisions?: No Does the patient have difficulty walking or climbing stairs?: No Does the patient have difficulty dressing or bathing?: No Does the patient have difficulty doing errands alone such as visiting a doctor's office or shopping?: No    Assessment & Plan  1. Controlled type 2 diabetes mellitus with microalbuminuria, with long-term current use of insulin (HCC)  - POCT HgB A1C  2. Hypertension associated with stage 3a chronic kidney disease due to type 2 diabetes mellitus (HCC)  - amLODipine-benazepril (LOTREL) 10-40 MG capsule; Take 1 capsule by mouth daily.  Dispense: 90 capsule; Refill: 1  3. Dyslipidemia associated with type 2 diabetes mellitus (HCC)  On statin therapy   4. Senile purpura (HCC)  Reassurance given   5. Stage 3a chronic kidney disease (HCC)  Monitoring   6. GERD without esophagitis  Controlled   7. BPH associated with nocturia  - finasteride (PROSCAR) 5 MG tablet; Take 1 tablet (5 mg total) by mouth at bedtime.  Dispense: 90 tablet; Refill: 1  8. Anemia of chronic disease  Stable  9. History of MI (myocardial infarction)  On statin therapy   10. Rash   Seeing Dermatologist

## 2023-05-29 ENCOUNTER — Ambulatory Visit (INDEPENDENT_AMBULATORY_CARE_PROVIDER_SITE_OTHER): Payer: Medicare HMO | Admitting: Family Medicine

## 2023-05-29 ENCOUNTER — Encounter: Payer: Self-pay | Admitting: Family Medicine

## 2023-05-29 VITALS — BP 122/66 | HR 82 | Temp 97.7°F | Resp 16 | Ht 71.0 in | Wt 170.6 lb

## 2023-05-29 DIAGNOSIS — Z794 Long term (current) use of insulin: Secondary | ICD-10-CM

## 2023-05-29 DIAGNOSIS — D638 Anemia in other chronic diseases classified elsewhere: Secondary | ICD-10-CM

## 2023-05-29 DIAGNOSIS — E1122 Type 2 diabetes mellitus with diabetic chronic kidney disease: Secondary | ICD-10-CM | POA: Diagnosis not present

## 2023-05-29 DIAGNOSIS — E1129 Type 2 diabetes mellitus with other diabetic kidney complication: Secondary | ICD-10-CM | POA: Diagnosis not present

## 2023-05-29 DIAGNOSIS — D692 Other nonthrombocytopenic purpura: Secondary | ICD-10-CM

## 2023-05-29 DIAGNOSIS — E1169 Type 2 diabetes mellitus with other specified complication: Secondary | ICD-10-CM | POA: Diagnosis not present

## 2023-05-29 DIAGNOSIS — K219 Gastro-esophageal reflux disease without esophagitis: Secondary | ICD-10-CM | POA: Diagnosis not present

## 2023-05-29 DIAGNOSIS — N1831 Chronic kidney disease, stage 3a: Secondary | ICD-10-CM | POA: Diagnosis not present

## 2023-05-29 DIAGNOSIS — R21 Rash and other nonspecific skin eruption: Secondary | ICD-10-CM

## 2023-05-29 DIAGNOSIS — I129 Hypertensive chronic kidney disease with stage 1 through stage 4 chronic kidney disease, or unspecified chronic kidney disease: Secondary | ICD-10-CM

## 2023-05-29 DIAGNOSIS — N401 Enlarged prostate with lower urinary tract symptoms: Secondary | ICD-10-CM | POA: Diagnosis not present

## 2023-05-29 DIAGNOSIS — I252 Old myocardial infarction: Secondary | ICD-10-CM

## 2023-05-29 LAB — POCT GLYCOSYLATED HEMOGLOBIN (HGB A1C): Hemoglobin A1C: 6.8 % — AB (ref 4.0–5.6)

## 2023-05-29 MED ORDER — FINASTERIDE 5 MG PO TABS: 5.0000 mg | ORAL_TABLET | Freq: Every day | ORAL | 1 refills | Status: DC

## 2023-05-29 MED ORDER — AMLODIPINE BESY-BENAZEPRIL HCL 10-40 MG PO CAPS: 1.0000 | ORAL_CAPSULE | Freq: Every day | ORAL | 1 refills | Status: DC

## 2023-06-12 DIAGNOSIS — H401132 Primary open-angle glaucoma, bilateral, moderate stage: Secondary | ICD-10-CM | POA: Diagnosis not present

## 2023-06-12 DIAGNOSIS — Z0100 Encounter for examination of eyes and vision without abnormal findings: Secondary | ICD-10-CM | POA: Diagnosis not present

## 2023-06-12 DIAGNOSIS — Z947 Corneal transplant status: Secondary | ICD-10-CM | POA: Diagnosis not present

## 2023-06-12 DIAGNOSIS — Z961 Presence of intraocular lens: Secondary | ICD-10-CM | POA: Diagnosis not present

## 2023-06-23 ENCOUNTER — Ambulatory Visit (INDEPENDENT_AMBULATORY_CARE_PROVIDER_SITE_OTHER): Payer: Medicare HMO

## 2023-06-23 VITALS — BP 132/88

## 2023-06-23 DIAGNOSIS — Z23 Encounter for immunization: Secondary | ICD-10-CM | POA: Diagnosis not present

## 2023-06-23 NOTE — Progress Notes (Signed)
Patient presented to clininc in expressed good health for HD Flu vaccine, Blood pressure check/Home monitor calibration and education on Dexcom7 and app installation and education.   Patient previously had an infected Dexcom site when applied at endocrinology per patient and was very attentive and compliant today with learning and receiving education materials. He was apprehensive about using "another app," but was reassured and I set it up with him step-by-step. We did encounter a technical issue and had to reach out to [facility] technical support. After 3 attempts with different support numbers we reached an agent and issue was resolved in 26 minutes.   Everything set up and patient felt confident in usage.  Patient tolerated vaccine well with no adverse effected noted at time of clinic departure.   Visit was around 1hr 30 min.

## 2023-06-26 DIAGNOSIS — C44319 Basal cell carcinoma of skin of other parts of face: Secondary | ICD-10-CM | POA: Diagnosis not present

## 2023-06-28 ENCOUNTER — Other Ambulatory Visit: Payer: Self-pay | Admitting: Family Medicine

## 2023-07-03 ENCOUNTER — Telehealth: Payer: Self-pay | Admitting: Family Medicine

## 2023-07-03 ENCOUNTER — Telehealth: Payer: Self-pay

## 2023-07-03 NOTE — Telephone Encounter (Signed)
Spoke with patient and per Dr. Carlynn Purl he needed to contact his endocrinologist for [facility] sensor prescription. Patient verbalized understanding and will let us know if he has any problems.

## 2023-07-03 NOTE — Telephone Encounter (Signed)
Pt is calling to speak to Dois Davenport reporting that his dexcom is reporting to expire in 2 hours. And he does not have another one. Please advise [phone]- [phone]

## 2023-07-04 ENCOUNTER — Telehealth: Payer: Self-pay | Admitting: Family Medicine

## 2023-07-04 ENCOUNTER — Ambulatory Visit: Payer: Medicare HMO

## 2023-07-04 NOTE — Telephone Encounter (Signed)
Pt stated he called his endocrinologist yesterday, and twice this morning he left a message and has not heard back. Asked if it is okay to wait 3 days. He left a message and was told it could take up to 3 days for him to hear back.  I called pt endocrinologist office Tiburcio Pea, Oscar La, MD. The office stated they would call pt right back.  Please review.

## 2023-07-04 NOTE — Telephone Encounter (Signed)
Copied from CRM [id number]. Topic: General - Other >> Jul 04, 2023  8:43 AM Franchot Heidelberg wrote: Reason for CRM: Pt is requesting an appt with Dois Davenport sometime this afternoon because he will be receiving his sensor sometime today. Please advise  Best contact: [phone] >> Jul 04, 2023 11:16 AM Cassandra G wrote: Lvm for pt to return call. Per Dois Davenport we can place him on the nurse schedule but we need to ask [place] Dec how to bill him

## 2023-07-04 NOTE — Telephone Encounter (Signed)
Pt returned call and nurse visit is sch'd for this afternoon

## 2023-07-04 NOTE — Progress Notes (Signed)
Patient presented to clinic for [facility] assistance. Assisted patient with changing and pairing sensor with app. Patient is showing more confidence since last week and expressed appreciation for our assistance as he navigates this "new technology".

## 2023-07-06 NOTE — Telephone Encounter (Signed)
Copied from CRM [id number]. Topic: General - Other >> Jul 06, 2023  1:22 PM Phill Myron wrote: Attn: Freada Bergeron.Marland KitchenMarland KitchenPlease call Tracy [name] he has a question for you (declined to say what about)

## 2023-07-07 ENCOUNTER — Ambulatory Visit: Payer: Medicare HMO

## 2023-07-07 ENCOUNTER — Other Ambulatory Visit: Payer: Self-pay

## 2023-07-07 MED ORDER — DEXCOM G7 SENSOR MISC: 1.0000 | 5 refills | Status: DC

## 2023-07-07 NOTE — Telephone Encounter (Signed)
Pt states dexcom to expensive at [facility] but contacted [facility]com and they state send to [facility] for much cheaper price?

## 2023-07-10 DIAGNOSIS — L300 Nummular dermatitis: Secondary | ICD-10-CM | POA: Diagnosis not present

## 2023-07-19 DIAGNOSIS — H903 Sensorineural hearing loss, bilateral: Secondary | ICD-10-CM | POA: Diagnosis not present

## 2023-07-19 DIAGNOSIS — H6123 Impacted cerumen, bilateral: Secondary | ICD-10-CM | POA: Diagnosis not present

## 2023-08-09 ENCOUNTER — Other Ambulatory Visit: Payer: Self-pay | Admitting: Family Medicine

## 2023-08-09 DIAGNOSIS — E1169 Type 2 diabetes mellitus with other specified complication: Secondary | ICD-10-CM

## 2023-08-16 ENCOUNTER — Other Ambulatory Visit: Payer: Self-pay

## 2023-08-16 ENCOUNTER — Telehealth: Payer: Self-pay | Admitting: Family Medicine

## 2023-08-16 DIAGNOSIS — Z85820 Personal history of malignant melanoma of skin: Secondary | ICD-10-CM

## 2023-08-16 NOTE — Telephone Encounter (Unsigned)
Copied from CRM [id number]. Topic: General - Inquiry >> Aug 16, 2023  9:39 AM De Blanch wrote: Reason for CRM: Pt is requesting a callback from Freada Bergeron. Stated it's not urgent; just wants to speak with her.  Declined to provide further details. Please advise.

## 2023-08-21 NOTE — Progress Notes (Signed)
Name: Tracy [name].   MRN: [id number]    DOB: 23-Sep-1929   Date:09/04/2023       Progress Note  Subjective  Chief Complaint  Chief Complaint  Patient presents with   Medical Management of Chronic Issues    Pt would like to know more about RSV shot    HPI  Discussed the use of AI scribe software for clinical note transcription with the patient, who gave verbal consent to proceed.  History of Present Illness   The patient, with a history of basal cell carcinoma, presents with a persistent soreness at the site of a recent surgical excision on the head, which was his eleventh such procedure. He also reports the emergence of small, itchy bumps on his skin, which have been present for approximately six months. These lesions initially appeared on the feet and ankles and have since spread to other areas of the body, including the thighs and back. The lesions are described as transient, appearing and disappearing in various locations, and evolving from itchy bumps to hard, crusty, brown spots over a period of about two weeks.  The patient sought treatment for these skin changes from a dermatologist, Dr. Adolphus Birchwood, who prescribed clobetasol and later mometasone, neither of which improved the condition. An antipruritic medication, was also prescribed but did not alleviate the itching. The patient denies any changes in medication or potential allergen exposure that could account for the rash.   The patient also has a history of diabetes, which is monitored using a Dexcom 7 sensor. He reports some difficulty with the sensor's adhesive cover detaching prematurely, but the sensor itself remains secure. His blood glucose levels have been somewhat elevated, with a recent reading of 244 mg/dL after a breakfast of oatmeal and cheese toast.  In addition to these concerns, the patient has been managing hypertension and an enlarged prostate with medication, including Cardura. However, he has experienced  episodes of dizziness upon standing, particularly after floor exercises. The patient's blood pressure readings have been on the lower end of normal, prompting a discussion about potentially discontinuing Cardura to prevent orthostatic hypotension.         Patient Active Problem List   Diagnosis Date Noted   Coronary artery disease 03/17/2022   Hypertrophy foliate papillae 03/17/2022   Stage 3a chronic kidney disease (HCC) 09/22/2021   Numbness and tingling of both feet 10/23/2020   Chronic ischemic heart disease 10/19/2020   Hypertension associated with stage 3a chronic kidney disease due to type 2 diabetes mellitus (HCC) 10/19/2020   Anemia of chronic disease 11/12/2019   Hearing loss on right 11/26/2018   Labyrinthitis of right ear 10/25/2018   Spinal stenosis of lumbar region with neurogenic claudication 05/03/2017   Type 2 diabetes mellitus without complication, with long-term current use of insulin (HCC) 05/03/2017   Left hip pain 07/28/2016   ALT (SGPT) level raised 07/13/2015   At risk for falling 07/13/2015   Arthritis of hand, degenerative 07/13/2015   Gout 07/13/2015   Glaucoma 07/13/2015   Diabetic neuropathy (HCC) 07/13/2015   Hypertension 04/09/2015   Type 2 diabetes mellitus with microalbuminuria, with long-term current use of insulin (HCC) 04/09/2015   Dyslipidemia 04/09/2015   Hypertriglyceridemia 04/09/2015   Benign fibroma of prostate 04/09/2015   Acid reflux 04/09/2015   History of melanoma excision 04/09/2015   History of squamous cell carcinoma 05/23/2013   H/O adenomatous polyp of colon 08/25/2010   History of MI (myocardial infarction) 08/27/1979    Past Surgical  History:  Procedure Laterality Date   CATARACT EXTRACTION W/ INTRAOCULAR LENS  IMPLANT, BILATERAL     CIRCUMCISION     COLONOSCOPY WITH PROPOFOL N/A 11/17/2021   Procedure: COLONOSCOPY WITH PROPOFOL;  Surgeon: Wyline Mood, MD;  Location: [facility] ENDOSCOPY;  Service: Gastroenterology;  Laterality:  N/A;   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/10/2019   GANGLION CYST EXCISION     LEFT HAND   LUMBAR LAMINECTOMY/DECOMPRESSION MICRODISCECTOMY  09/13/2017   Procedure: BILATERAL Lumbar three-four , Left  Lumbar four-five  AND Left  L5-S1, LAMINECTOMY/FORAMINOTOMY;  Surgeon: Donalee Citrin, MD;  Location[facility] OR;  Service: Neurosurgery;;   MELANOMA EXCISION  07/2011   TONSILLECTOMY     VASECTOMY      Family History  Problem Relation Age of Onset   Heart disease Mother    Diabetes Mother        type 2   Angina Mother    Heart disease Brother    Pneumonia Father    Heart attack Father    Alcoholism Daughter    Cirrhosis Daughter     Social History   Tobacco Use   Smoking status: Former    Current packs/day: 0.00    Average packs/day: 2.0 packs/day for 13.0 years (26.0 ttl pk-yrs)    Types: Cigarettes    Start date: 75    Quit date: 1963    Years since quitting: 62.0   Smokeless tobacco: Never   Tobacco comments:    smoking cessation materials not required  Substance Use Topics   Alcohol use: Yes    Alcohol/week: 2.0 standard drinks of alcohol    Types: 2 Shots of liquor per week    Comment: DAILY     Current Outpatient Medications:    Accu-Chek FastClix Lancets MISC, CHECK  BLOOD  SUGAR TWICE DAILY, Disp: 204 each, Rfl: 1   allopurinol (ZYLOPRIM) 300 MG tablet, TAKE 1 TABLET EVERY DAY, Disp: 90 tablet, Rfl: 3   amLODipine-benazepril (LOTREL) 10-40 MG capsule, Take 1 capsule by mouth daily., Disp: 90 capsule, Rfl: 1   aspirin 81 MG tablet, Take 81 mg by mouth., Disp: , Rfl:    atorvastatin (LIPITOR) 10 MG tablet, TAKE 1 TABLET DAILY AT 6 PM., Disp: 90 tablet, Rfl: 3   Blood Glucose Monitoring Suppl (ACCU-CHEK NANO SMARTVIEW) W/DEVICE KIT, USE AS DIRECTED, Disp: 1 kit, Rfl: 0   Cholecalciferol (VITAMIN D) 2000 units tablet, Take 2,000 Units by mouth daily., Disp: , Rfl:    clobetasol ointment (TEMOVATE) 0.05 %, Apply topically 2 (two) times daily., Disp: , Rfl:    Continuous  Glucose Sensor (DEXCOM G7 SENSOR) MISC, 1 each by Does not apply route as directed., Disp: 3 each, Rfl: 5   cyanocobalamin 2000 MCG tablet, Take 2,000 mcg by mouth daily., Disp: , Rfl:    dorzolamide-timolol (COSOPT) 22.3-6.8 MG/ML ophthalmic solution, Place 1 drop into the left eye daily at 2 PM., Disp: , Rfl:    doxazosin (CARDURA) 2 MG tablet, TAKE 1 TABLET AT BEDTIME, Disp: 90 tablet, Rfl: 0   finasteride (PROSCAR) 5 MG tablet, Take 1 tablet (5 mg total) by mouth at bedtime., Disp: 90 tablet, Rfl: 1   fluticasone (FLONASE) 50 MCG/ACT nasal spray, 2 sprays daily., Disp: , Rfl:    insulin glargine (LANTUS SOLOSTAR) 100 UNIT/ML Solostar Pen, Inject 46 Units into the skin daily., Disp: 45 mL, Rfl: 1   Insulin Pen Needle (DROPLET PEN NEEDLES) 31G X 5 MM MISC, USE AS NEEDED., Disp: 100 each, Rfl: 3  JARDIANCE 10 MG TABS tablet, Take 1 tablet (10 mg total) by mouth daily., Disp: 90 tablet, Rfl: 1   Lancet Devices (TRUEDRAW LANCING DEVICE) MISC, 1 each by Does not apply route once for 1 dose., Disp: 1 each, Rfl: 0   Multiple Vitamins-Minerals (CENTRUM SILVER 50+MEN) TABS, Take 1 tablet by mouth daily., Disp: , Rfl:    Omega-3 Fatty Acids (FISH OIL) 1000 MG CPDR, Take 1 capsule by mouth 2 (two) times daily., Disp: 60 capsule, Rfl: 0   omeprazole (PRILOSEC) 40 MG capsule, Take 1 capsule (40 mg total) by mouth daily., Disp: 90 capsule, Rfl: 1   polyethylene glycol powder (GLYCOLAX/MIRALAX) 17 GM/SCOOP powder, Take 17 g by mouth daily., Disp: 3350 g, Rfl: 1   prednisoLONE acetate (PRED FORTE) 1 % ophthalmic suspension, Place into the right eye., Disp: , Rfl:   Allergies  Allergen Reactions   Tetracyclines & Related Other (See Comments)    UNSPECIFIED REACTION  other    I personally reviewed active problem list, medication list, allergies with the patient/caregiver today.   ROS  Ten systems reviewed and is negative except as mentioned in HPI    Objective  Vitals:   09/04/23 0759  BP: 122/68   Pulse: 91  Resp: 16  Temp: 97.7 F (36.5 C)  TempSrc: Oral  SpO2: 99%  Weight: 167 lb 6.4 oz (75.9 kg)  Height: 5\' 11"  (1.803 m)    Body mass index is 23.35 kg/m.  Physical Exam  Constitutional: Patient appears well-developed and well-nourished. No distress.  HEENT: head atraumatic, normocephalic, pupils equal and reactive to light, neck supple Cardiovascular: Normal rate, regular rhythm and normal heart sounds.  No murmur heard. No BLE edema. Pulmonary/Chest: Effort normal and breath sounds normal. No respiratory distress. Abdominal: Soft.  There is no tenderness. Psychiatric: Patient has a normal mood and affect. behavior is normal. Judgment and thought content normal.     PHQ2/9:    09/04/2023    7:57 AM 05/29/2023    8:02 AM 05/03/2023   10:16 AM 01/27/2023    8:39 AM 12/16/2022   10:51 AM  Depression screen PHQ 2/9  Decreased Interest 0 0 0 0 0  Down, Depressed, Hopeless 0 0 0 0 0  PHQ - 2 Score 0 0 0 0 0  Altered sleeping 0 0 0 0   Tired, decreased energy 0 0 0 0   Change in appetite 0 0 0 0   Feeling bad or failure about yourself  0 0 0 0   Trouble concentrating 0 0 0 0   Moving slowly or fidgety/restless 0 0 0 0   Suicidal thoughts 0 0 0 0   PHQ-9 Score 0 0 0 0   Difficult doing work/chores Not difficult at all        phq 9 is negative   Fall Risk:    09/04/2023    7:56 AM 05/29/2023    8:02 AM 05/03/2023   10:16 AM 01/27/2023    8:39 AM 12/16/2022   10:48 AM  Fall Risk   Falls in the past year? 1 1 1 1 1   Comment     dec 23:going up stairs too fast  Number falls in past yr: 1 0 0 0 0  Injury with Fall? 0 0 0 1 0  Risk for fall due to : Impaired balance/gait History of fall(s) History of fall(s) No Fall Risks No Fall Risks  Follow up Falls prevention discussed;Education provided;Falls evaluation completed Education provided;Falls evaluation  completed;Falls prevention discussed Falls prevention discussed;Falls evaluation completed;Education provided Falls  prevention discussed Education provided;Falls prevention discussed     Assessment & Plan  Assessment and Plan    Pruritic Rash Chronic, itchy, papular rash of 6 months duration, unresponsive to topical clobetasol and mometasone. Lesions appear to evolve from itchy papules to brown, crusty patches. No known new medications or allergen exposures. -Schedule for punch biopsy of both new and old lesions as soon as possible. -Continue current topical medications, but avoid applying to biopsy sites on day of procedure. -Continue with scheduled with new dermatologist on March 25th.  Skin cancers Recent surgical removal of lesion on head, with residual soreness. Another lesion under observation. -Continue follow-up with dermatologist.  Hypertension Blood pressure on the lower end of normal, with some dizziness on standing. Currently on finasteride and doxazosin (Cardura) for prostate management. -Discontinue Cardura to allow for slight increase in blood pressure and reduce risk of dizziness on standing. Monitor for any worsening of prostate symptoms.  Diabetes type 2  Last A1C was 6.8. Recent blood glucose reading was 244 post-breakfast. Patient uses Dexcom 7 for glucose monitoring. -Advise on reducing carbohydrate intake at breakfast, particularly avoiding combination of oatmeal and bread. -Continue current diabetes management and glucose monitoring. -Address Dexcom 7 cover issue:reassure that cover is not necessary if sensor is staying in place.  Follow-up Schedule for punch biopsy as soon as possible. Regular follow-up appointment in one month.

## 2023-09-04 ENCOUNTER — Ambulatory Visit (INDEPENDENT_AMBULATORY_CARE_PROVIDER_SITE_OTHER): Payer: Medicare HMO | Admitting: Family Medicine

## 2023-09-04 ENCOUNTER — Encounter: Payer: Self-pay | Admitting: Family Medicine

## 2023-09-04 VITALS — BP 122/68 | HR 91 | Temp 97.7°F | Resp 16 | Ht 71.0 in | Wt 167.4 lb

## 2023-09-04 DIAGNOSIS — E1129 Type 2 diabetes mellitus with other diabetic kidney complication: Secondary | ICD-10-CM

## 2023-09-04 DIAGNOSIS — R809 Proteinuria, unspecified: Secondary | ICD-10-CM

## 2023-09-04 DIAGNOSIS — L282 Other prurigo: Secondary | ICD-10-CM

## 2023-09-04 DIAGNOSIS — E785 Hyperlipidemia, unspecified: Secondary | ICD-10-CM | POA: Diagnosis not present

## 2023-09-04 DIAGNOSIS — E1169 Type 2 diabetes mellitus with other specified complication: Secondary | ICD-10-CM | POA: Diagnosis not present

## 2023-09-04 DIAGNOSIS — Z85828 Personal history of other malignant neoplasm of skin: Secondary | ICD-10-CM

## 2023-09-04 DIAGNOSIS — Z794 Long term (current) use of insulin: Secondary | ICD-10-CM

## 2023-09-04 DIAGNOSIS — I10 Essential (primary) hypertension: Secondary | ICD-10-CM | POA: Diagnosis not present

## 2023-09-13 LAB — HEMOGLOBIN A1C: Hemoglobin A1C: 7.1

## 2023-09-28 ENCOUNTER — Other Ambulatory Visit (HOSPITAL_COMMUNITY)
Admission: RE | Admit: 2023-09-28 | Discharge: 2023-09-28 | Disposition: A | Discharge status: Home / Self Care | Payer: Medicare HMO | Source: Ambulatory Visit | Attending: Family Medicine | Admitting: Family Medicine

## 2023-09-28 ENCOUNTER — Ambulatory Visit: Payer: Medicare HMO | Admitting: Family Medicine

## 2023-09-28 ENCOUNTER — Encounter: Payer: Self-pay | Admitting: Family Medicine

## 2023-09-28 VITALS — BP 130/72 | HR 76 | Temp 97.9°F | Resp 16 | Ht 71.0 in | Wt 167.2 lb

## 2023-09-28 DIAGNOSIS — R3916 Straining to void: Secondary | ICD-10-CM

## 2023-09-28 DIAGNOSIS — N401 Enlarged prostate with lower urinary tract symptoms: Secondary | ICD-10-CM | POA: Diagnosis not present

## 2023-09-28 DIAGNOSIS — L282 Other prurigo: Secondary | ICD-10-CM

## 2023-09-28 DIAGNOSIS — L308 Other specified dermatitis: Secondary | ICD-10-CM | POA: Diagnosis not present

## 2023-09-28 MED ORDER — LIDOCAINE HCL 1 % IJ SOLN
1.0000 mL | Freq: Once | INTRAMUSCULAR | Status: AC
  Administered 2023-09-28: 1 mL

## 2023-09-28 MED ORDER — DOXAZOSIN MESYLATE 1 MG PO TABS: 1.0000 mg | ORAL_TABLET | Freq: Every day | ORAL | 0 refills | Status: DC

## 2023-09-28 NOTE — Addendum Note (Signed)
Addended by: Forde Radon on: 09/28/2023 08:46 AM   Modules accepted: Orders

## 2023-09-28 NOTE — Progress Notes (Signed)
Name: Tracy [name].   MRN: [id number]    DOB: 06/06/1930   Date:09/28/2023       Progress Note  Subjective  Chief Complaint  Chief Complaint  Patient presents with   punch biopsy skin   HPI   Patient came in today for biopsy of pruriginous rash that has been going on for at least 6  months, he has seen dermatologist but topical steroids have not improved symptoms , Sarna has been slightly helpful but he continues to have new rashes. Wife does not have any symptoms. Last time he travelled was about one year ago. No guests. He will see a new dermatologist in March but wants answers.  BPH: we stopped cardura due to lower bp but he states having difficulty voiding specially in am, we will resume medication at lower dose , 1 mg daily and monitor for orthostatic changes  Patient Active Problem List   Diagnosis Date Noted   Coronary artery disease 03/17/2022   Hypertrophy foliate papillae 03/17/2022   Stage 3a chronic kidney disease (HCC) 09/22/2021   Numbness and tingling of both feet 10/23/2020   Chronic ischemic heart disease 10/19/2020   Hypertension associated with stage 3a chronic kidney disease due to type 2 diabetes mellitus (HCC) 10/19/2020   Anemia of chronic disease 11/12/2019   Hearing loss on right 11/26/2018   Labyrinthitis of right ear 10/25/2018   Spinal stenosis of lumbar region with neurogenic claudication 05/03/2017   Left hip pain 07/28/2016   ALT (SGPT) level raised 07/13/2015   At risk for falling 07/13/2015   Arthritis of hand, degenerative 07/13/2015   Gout 07/13/2015   Glaucoma 07/13/2015   Diabetic neuropathy (HCC) 07/13/2015   Hypertension 04/09/2015   Type 2 diabetes mellitus with microalbuminuria, with long-term current use of insulin (HCC) 04/09/2015   Dyslipidemia 04/09/2015   Hypertriglyceridemia 04/09/2015   Benign fibroma of prostate 04/09/2015   Acid reflux 04/09/2015   History of melanoma excision 04/09/2015   History of squamous cell  carcinoma 05/23/2013   H/O adenomatous polyp of colon 08/25/2010   History of MI (myocardial infarction) 08/27/1979    Past Surgical History:  Procedure Laterality Date   CATARACT EXTRACTION W/ INTRAOCULAR LENS  IMPLANT, BILATERAL     CIRCUMCISION     COLONOSCOPY WITH PROPOFOL N/A 11/17/2021   Procedure: COLONOSCOPY WITH PROPOFOL;  Surgeon: Wyline Mood, MD;  Location: [facility] ENDOSCOPY;  Service: Gastroenterology;  Laterality: N/A;   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/10/2019   GANGLION CYST EXCISION     LEFT HAND   LUMBAR LAMINECTOMY/DECOMPRESSION MICRODISCECTOMY  09/13/2017   Procedure: BILATERAL Lumbar three-four , Left  Lumbar four-five  AND Left  L5-S1, LAMINECTOMY/FORAMINOTOMY;  Surgeon: Donalee Citrin, MD;  Location: [facility] OR;  Service: Neurosurgery;;   MELANOMA EXCISION  07/2011   TONSILLECTOMY     VASECTOMY      Family History  Problem Relation Age of Onset   Heart disease Mother    Diabetes Mother        type 2   Angina Mother    Heart disease Brother    Pneumonia Father    Heart attack Father    Alcoholism Daughter    Cirrhosis Daughter     Social History   Tobacco Use   Smoking status: Former    Current packs/day: 0.00    Average packs/day: 2.0 packs/day for 13.0 years (26.0 ttl pk-yrs)    Types: Cigarettes    Start date: 21    Quit date: 1963  Years since quitting: 62.0   Smokeless tobacco: Never   Tobacco comments:    smoking cessation materials not required  Substance Use Topics   Alcohol use: Yes    Alcohol/week: 2.0 standard drinks of alcohol    Types: 2 Shots of liquor per week    Comment: DAILY     Current Outpatient Medications:    Accu-Chek FastClix Lancets MISC, CHECK  BLOOD  SUGAR TWICE DAILY, Disp: 204 each, Rfl: 1   allopurinol (ZYLOPRIM) 300 MG tablet, TAKE 1 TABLET EVERY DAY, Disp: 90 tablet, Rfl: 3   amLODipine-benazepril (LOTREL) 10-40 MG capsule, Take 1 capsule by mouth daily., Disp: 90 capsule, Rfl: 1   aspirin 81 MG tablet, Take 81 mg by  mouth., Disp: , Rfl:    atorvastatin (LIPITOR) 10 MG tablet, TAKE 1 TABLET DAILY AT 6 PM., Disp: 90 tablet, Rfl: 3   Blood Glucose Monitoring Suppl (ACCU-CHEK NANO SMARTVIEW) W/DEVICE KIT, USE AS DIRECTED, Disp: 1 kit, Rfl: 0   Cholecalciferol (VITAMIN D) 2000 units tablet, Take 2,000 Units by mouth daily., Disp: , Rfl:    clobetasol ointment (TEMOVATE) 0.05 %, Apply topically 2 (two) times daily., Disp: , Rfl:    Continuous Glucose Sensor (DEXCOM G7 SENSOR) MISC, 1 each by Does not apply route as directed., Disp: 3 each, Rfl: 5   cyanocobalamin 2000 MCG tablet, Take 2,000 mcg by mouth daily., Disp: , Rfl:    dorzolamide-timolol (COSOPT) 22.3-6.8 MG/ML ophthalmic solution, Place 1 drop into the left eye daily at 2 PM., Disp: , Rfl:    finasteride (PROSCAR) 5 MG tablet, Take 1 tablet (5 mg total) by mouth at bedtime., Disp: 90 tablet, Rfl: 1   fluticasone (FLONASE) 50 MCG/ACT nasal spray, 2 sprays daily., Disp: , Rfl:    insulin glargine (LANTUS SOLOSTAR) 100 UNIT/ML Solostar Pen, Inject 46 Units into the skin daily., Disp: 45 mL, Rfl: 1   Insulin Pen Needle (DROPLET PEN NEEDLES) 31G X 5 MM MISC, USE AS NEEDED., Disp: 100 each, Rfl: 3   JARDIANCE 10 MG TABS tablet, Take 1 tablet (10 mg total) by mouth daily., Disp: 90 tablet, Rfl: 1   Lancet Devices (TRUEDRAW LANCING DEVICE) MISC, 1 each by Does not apply route once for 1 dose., Disp: 1 each, Rfl: 0   Multiple Vitamins-Minerals (CENTRUM SILVER 50+MEN) TABS, Take 1 tablet by mouth daily., Disp: , Rfl:    Omega-3 Fatty Acids (FISH OIL) 1000 MG CPDR, Take 1 capsule by mouth 2 (two) times daily., Disp: 60 capsule, Rfl: 0   omeprazole (PRILOSEC) 40 MG capsule, Take 1 capsule (40 mg total) by mouth daily., Disp: 90 capsule, Rfl: 1   polyethylene glycol powder (GLYCOLAX/MIRALAX) 17 GM/SCOOP powder, Take 17 g by mouth daily., Disp: 3350 g, Rfl: 1   prednisoLONE acetate (PRED FORTE) 1 % ophthalmic suspension, Place into the right eye., Disp: , Rfl:    Allergies  Allergen Reactions   Tetracyclines & Related Other (See Comments)    UNSPECIFIED REACTION  other    I personally reviewed active problem list, medication list, allergies with the patient/caregiver today.   ROS  Ten systems reviewed and is negative except as mentioned in HPI    Objective  Vitals:   09/28/23 0754  BP: 130/72  Pulse: 76  Resp: 16  Temp: 97.9 F (36.6 C)  TempSrc: Oral  SpO2: 97%  Weight: 167 lb 3.2 oz (75.8 kg)  Height: 5\' 11"  (1.803 m)    Body mass index is 23.32 kg/m.  Physical Exam  Constitutional: Patient appears well-developed and well-nourished.  No distress.  HEENT: head atraumatic, neck supple Cardiovascular: Normal rate, regular rhythm and normal heart sounds.  No murmur heard. No BLE edema. Pulmonary/Chest: Effort normal and breath sounds normal. No respiratory distress. Skin: erythematous rash, papules in different sizes , some have a small crusting.  Psychiatric: Patient has a normal mood and affect. behavior is normal. Judgment and thought content normal.   Diabetic Foot Exam:     PHQ2/9:    09/28/2023    7:48 AM 09/04/2023    7:57 AM 05/29/2023    8:02 AM 05/03/2023   10:16 AM 01/27/2023    8:39 AM  Depression screen PHQ 2/9  Decreased Interest 0 0 0 0 0  Down, Depressed, Hopeless 0 0 0 0 0  PHQ - 2 Score 0 0 0 0 0  Altered sleeping 0 0 0 0 0  Tired, decreased energy 0 0 0 0 0  Change in appetite 0 0 0 0 0  Feeling bad or failure about yourself  0 0 0 0 0  Trouble concentrating 0 0 0 0 0  Moving slowly or fidgety/restless 0 0 0 0 0  Suicidal thoughts 0 0 0 0 0  PHQ-9 Score 0 0 0 0 0  Difficult doing work/chores Not difficult at all Not difficult at all       phq 9 is negative  Fall Risk:    09/28/2023    7:48 AM 09/04/2023    7:56 AM 05/29/2023    8:02 AM 05/03/2023   10:16 AM 01/27/2023    8:39 AM  Fall Risk   Falls in the past year? 1 1 1 1 1   Number falls in past yr: 1 1 0 0 0  Injury with Fall? 1 0  0 0 1  Risk for fall due to : Impaired balance/gait Impaired balance/gait History of fall(s) History of fall(s) No Fall Risks  Follow up Education provided;Falls evaluation completed;Falls prevention discussed Falls prevention discussed;Education provided;Falls evaluation completed Education provided;Falls evaluation completed;Falls prevention discussed Falls prevention discussed;Falls evaluation completed;Education provided Falls prevention discussed     Assessment & Plan  1. Pruritic rash (Primary)  - Surgical pathology   Consent signed: YES  Procedure: Skin Mass Removal Location: left upper back   Equipment used: punch biopsy blade 4 mm  Anesthesia: 1% Lidocaine w/o Epinephrine  Cleaned and prepped: alcohol   After consent signed, are of skin prepped with betadine. Lidocaine w/o epinephrine injected into skin underneath skin mass. After properly numbed sterile equipment used to remove tag.  Specimen sent for pathology analysis. Instructed on proper care to allow for proper healing. F/U for nursing visit if needed.   2. Benign prostatic hyperplasia (BPH) with straining on urination  - doxazosin (CARDURA) 1 MG tablet; Take 1 tablet (1 mg total) by mouth daily.  Dispense: 30 tablet; Refill: 0

## 2023-10-03 ENCOUNTER — Other Ambulatory Visit: Payer: Self-pay | Admitting: Family Medicine

## 2023-10-03 MED ORDER — DEXCOM G7 SENSOR MISC: 1.0000 | 5 refills | Status: DC

## 2023-10-03 NOTE — Telephone Encounter (Signed)
Requested Prescriptions  Pending Prescriptions Disp Refills   Continuous Glucose Sensor (DEXCOM G7 SENSOR) MISC 3 each 5    Sig: 1 each by Does not apply route as directed.     Endocrinology: Diabetes - Testing Supplies Passed - 10/03/2023  1:45 PM      Passed - Valid encounter within last 12 months    Recent Outpatient Visits           5 days ago Pruritic rash   [facility] Alba Cory, MD   4 weeks ago Pruritic rash   [facility] Alba Cory, MD   4 months ago Controlled type 2 diabetes mellitus with microalbuminuria, with long-term current use of insulin [facility])   [facility] Alba Cory, MD   5 months ago Cutaneous abscess of other site   [facility] Alba Cory, MD   8 months ago Controlled type 2 diabetes mellitus with microalbuminuria, with long-term current use of insulin [facility])   [facility] Alba Cory, MD       Future Appointments             In 2 days Alba Cory, MD [facility]   In 2 months Elie Goody, MD [facility]

## 2023-10-03 NOTE — Telephone Encounter (Signed)
Medication Refill -  Most Recent Primary Care Visit:  Provider: Alba Cory  Department: [facility]  Visit Type: OFFICE VISIT  Date: 09/28/2023  Medication: Continuous Glucose Sensor (DEXCOM G7 SENSOR) MISC  [place] with [facility] pharmacy requesting fill, pharmacy had sent request to office on 09/22/2023 and have not heard back.  Has the patient contacted their pharmacy? Yes  Is this the correct pharmacy for this prescription? Yes This is the patient's preferred pharmacy:  [facility] - [place], [place] - [address] [place] [address] Phone: [phone] Fax: [phone]   Has the prescription been filled recently? No  Is the patient out of the medication? Unsure  Has the patient been seen for an appointment in the last year OR does the patient have an upcoming appointment? Yes  Can we respond through MyChart? Yes  Agent: Please be advised that Rx refills may take up to 3 business days. We ask that you follow-up with your pharmacy.

## 2023-10-04 ENCOUNTER — Encounter: Payer: Self-pay | Admitting: Family Medicine

## 2023-10-04 LAB — SURGICAL PATHOLOGY

## 2023-10-05 ENCOUNTER — Encounter: Payer: Self-pay | Admitting: Family Medicine

## 2023-10-05 ENCOUNTER — Ambulatory Visit (INDEPENDENT_AMBULATORY_CARE_PROVIDER_SITE_OTHER): Payer: Medicare HMO | Admitting: Family Medicine

## 2023-10-05 VITALS — BP 132/74 | HR 78 | Temp 97.9°F | Resp 16 | Ht 71.0 in | Wt 167.8 lb

## 2023-10-05 DIAGNOSIS — E538 Deficiency of other specified B group vitamins: Secondary | ICD-10-CM

## 2023-10-05 DIAGNOSIS — E785 Hyperlipidemia, unspecified: Secondary | ICD-10-CM

## 2023-10-05 DIAGNOSIS — E559 Vitamin D deficiency, unspecified: Secondary | ICD-10-CM | POA: Diagnosis not present

## 2023-10-05 DIAGNOSIS — D692 Other nonthrombocytopenic purpura: Secondary | ICD-10-CM | POA: Diagnosis not present

## 2023-10-05 DIAGNOSIS — I10 Essential (primary) hypertension: Secondary | ICD-10-CM

## 2023-10-05 DIAGNOSIS — N1831 Chronic kidney disease, stage 3a: Secondary | ICD-10-CM | POA: Diagnosis not present

## 2023-10-05 DIAGNOSIS — M109 Gout, unspecified: Secondary | ICD-10-CM | POA: Diagnosis not present

## 2023-10-05 DIAGNOSIS — D696 Thrombocytopenia, unspecified: Secondary | ICD-10-CM | POA: Diagnosis not present

## 2023-10-05 DIAGNOSIS — N401 Enlarged prostate with lower urinary tract symptoms: Secondary | ICD-10-CM

## 2023-10-05 DIAGNOSIS — E1169 Type 2 diabetes mellitus with other specified complication: Secondary | ICD-10-CM

## 2023-10-05 DIAGNOSIS — L308 Other specified dermatitis: Secondary | ICD-10-CM

## 2023-10-05 DIAGNOSIS — K219 Gastro-esophageal reflux disease without esophagitis: Secondary | ICD-10-CM | POA: Diagnosis not present

## 2023-10-05 LAB — POCT GLYCOSYLATED HEMOGLOBIN (HGB A1C): Hemoglobin A1C: 7.3 % — AB (ref 4.0–5.6)

## 2023-10-05 MED ORDER — JARDIANCE 10 MG PO TABS: 10.0000 mg | ORAL_TABLET | Freq: Every day | ORAL | 1 refills | Status: DC

## 2023-10-05 MED ORDER — OMEPRAZOLE 40 MG PO CPDR: 40.0000 mg | DELAYED_RELEASE_CAPSULE | Freq: Every day | ORAL | 1 refills | Status: DC

## 2023-10-05 NOTE — Progress Notes (Addendum)
Name: Tracy [name].   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 He has been doing well since, denies chest pain, SOB or orthopnea.    DMII: he was seeing  Dr. Tiburcio Pea at [facility] but no longer going since A1C has been stable. He is compliant with lantus and Marcelline Deist, denies hypoglycemic episodes.  He has dyslipidemia but LDL is at goal, CKI stage III and is taking Jardiance and ACE. Eye exam is up to date Denies polyphagia, polydipsia or polyuria Dexcom 7 average glucose of 159 and A1C of 7.1 % , A1C in our office is 7.3 %    Hyperlipidemia: taking medication and denies side effects, LDL has been at goal. Unchanged   Recent fall: he was listening to the radio, he dozed off, phone went off , he jumped out of the chair , missed the phone and hit the table with his stomach 3 days ago, he has a large bruise but states pain has subsided now    HTN: he is compliant with his medications,  no chest pain or palpitation, no dizziness. BP today is at goal for him. No longer having orthostatic changes   Thrombocytopenia: we will recheck labs today    Hearing loss sudden, happened after an URI back in Dec 2020  seen by Dr. Jenne Campus and had steroid and is wearing his hearing aids . Unchanged   Osteoarthritis: he has OA of left thumb, intermittent medial knee pain when moving/walking but responding to topical medication Taking Tylenol and voltaren gel prn. Discussed going back to Ortho but he states pain is under control at this time   Gout: under control with allopurinol 300 mg daily ,no recent problems     Senile purpura: on arms and it is stable , reassurance given    BPH: we had stopped cardura about one month ago , still on finasteride , however noticed difficulty voiding in the morning and incomplete void, he has a cardura 1 mg dose on the way to his house, advised to try lower dose first since higher dose caused hypotension and we will see if it works    Patient Active Problem List   Diagnosis Date Noted   Coronary artery disease 03/17/2022   Hypertrophy foliate papillae 03/17/2022   Stage 3a chronic kidney disease (HCC) 09/22/2021   Numbness and tingling of both feet 10/23/2020   Chronic ischemic heart disease 10/19/2020   Hypertension associated with stage 3a chronic kidney disease due to type 2 diabetes mellitus (HCC) 10/19/2020   Anemia of chronic disease 11/12/2019   Hearing loss on right 11/26/2018   Labyrinthitis of right ear 10/25/2018   Spinal stenosis of lumbar region with neurogenic claudication 05/03/2017   Left hip pain 07/28/2016   ALT (SGPT) level raised 07/13/2015   At risk for falling 07/13/2015   Arthritis of hand, degenerative 07/13/2015   Gout 07/13/2015   Glaucoma 07/13/2015   Diabetic neuropathy (HCC) 07/13/2015   Hypertension 04/09/2015   Type 2 diabetes mellitus with microalbuminuria, with long-term  current use of insulin (HCC) 04/09/2015   Dyslipidemia 04/09/2015   Hypertriglyceridemia 04/09/2015   Benign fibroma of prostate 04/09/2015   Acid reflux 04/09/2015   History of melanoma excision 04/09/2015   History of squamous cell carcinoma 05/23/2013   H/O adenomatous polyp of colon 08/25/2010   History of MI (myocardial infarction) 08/27/1979    Past Surgical History:  Procedure Laterality Date   CATARACT EXTRACTION W/ INTRAOCULAR LENS  IMPLANT, BILATERAL     CIRCUMCISION     COLONOSCOPY WITH PROPOFOL N/A 11/17/2021   Procedure: COLONOSCOPY WITH PROPOFOL;  Surgeon: Wyline Mood, MD;  Location: [facility] ENDOSCOPY;  Service: Gastroenterology;  Laterality:  N/A;   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/10/2019   GANGLION CYST EXCISION     LEFT HAND   LUMBAR LAMINECTOMY/DECOMPRESSION MICRODISCECTOMY  09/13/2017   Procedure: BILATERAL Lumbar three-four , Left  Lumbar four-five  AND Left  L5-S1, LAMINECTOMY/FORAMINOTOMY;  Surgeon: Donalee Citrin, MD;  Location: [facility] OR;  Service: Neurosurgery;;   MELANOMA EXCISION  07/2011   TONSILLECTOMY     VASECTOMY      Family History  Problem Relation Age of Onset   Heart disease Mother    Diabetes Mother        type 2   Angina Mother    Heart disease Brother    Pneumonia Father    Heart attack Father    Alcoholism Daughter    Cirrhosis Daughter     Social History   Tobacco Use   Smoking status: Former    Current packs/day: 0.00    Average packs/day: 2.0 packs/day for 13.0 years (26.0 ttl pk-yrs)    Types: Cigarettes    Start date: 87    Quit date: 1963    Years since quitting: 62.1   Smokeless tobacco: Never   Tobacco comments:    smoking cessation materials not required  Substance Use Topics   Alcohol use: Yes    Alcohol/week: 2.0 standard drinks of alcohol    Types: 2 Shots of liquor per week    Comment: DAILY     Current Outpatient Medications:    Accu-Chek FastClix Lancets MISC, CHECK  BLOOD  SUGAR TWICE DAILY, Disp: 204 each, Rfl: 1   allopurinol (ZYLOPRIM) 300 MG tablet, TAKE 1 TABLET EVERY DAY, Disp: 90 tablet, Rfl: 3   amLODipine-benazepril (LOTREL) 10-40 MG capsule, Take 1 capsule by mouth daily., Disp: 90 capsule, Rfl: 1   aspirin 81 MG tablet, Take 81 mg by mouth., Disp: , Rfl:    atorvastatin (LIPITOR) 10 MG tablet, TAKE 1 TABLET DAILY AT 6 PM., Disp: 90 tablet, Rfl: 3   Blood Glucose Monitoring Suppl (ACCU-CHEK NANO SMARTVIEW) W/DEVICE KIT, USE AS DIRECTED, Disp: 1 kit, Rfl: 0   Cholecalciferol (VITAMIN D) 2000 units tablet, Take 2,000 Units by mouth daily., Disp: , Rfl:    clobetasol ointment (TEMOVATE) 0.05 %, Apply topically 2 (two) times daily., Disp: , Rfl:    Continuous  Glucose Sensor (DEXCOM G7 SENSOR) MISC, 1 each by Does not apply route as directed., Disp: 3 each, Rfl: 5   cyanocobalamin 2000 MCG tablet, Take 2,000 mcg by mouth daily., Disp: , Rfl:    dorzolamide-timolol (COSOPT) 22.3-6.8 MG/ML ophthalmic solution, Place 1 drop into the left eye daily at 2 PM., Disp: , Rfl:    doxazosin (CARDURA) 1 MG tablet, Take 1 tablet (1 mg total) by mouth daily., Disp: 30 tablet, Rfl: 0   finasteride (PROSCAR) 5 MG tablet, Take 1 tablet (5 mg total) by mouth  at bedtime., Disp: 90 tablet, Rfl: 1   fluticasone (FLONASE) 50 MCG/ACT nasal spray, 2 sprays daily., Disp: , Rfl:    insulin glargine (LANTUS SOLOSTAR) 100 UNIT/ML Solostar Pen, Inject 46 Units into the skin daily., Disp: 45 mL, Rfl: 1   Insulin Pen Needle (DROPLET PEN NEEDLES) 31G X 5 MM MISC, USE AS NEEDED., Disp: 100 each, Rfl: 3   JARDIANCE 10 MG TABS tablet, Take 1 tablet (10 mg total) by mouth daily., Disp: 90 tablet, Rfl: 1   Lancet Devices (TRUEDRAW LANCING DEVICE) MISC, 1 each by Does not apply route once for 1 dose., Disp: 1 each, Rfl: 0   Multiple Vitamins-Minerals (CENTRUM SILVER 50+MEN) TABS, Take 1 tablet by mouth daily., Disp: , Rfl:    Omega-3 Fatty Acids (FISH OIL) 1000 MG CPDR, Take 1 capsule by mouth 2 (two) times daily., Disp: 60 capsule, Rfl: 0   omeprazole (PRILOSEC) 40 MG capsule, Take 1 capsule (40 mg total) by mouth daily., Disp: 90 capsule, Rfl: 1   polyethylene glycol powder (GLYCOLAX/MIRALAX) 17 GM/SCOOP powder, Take 17 g by mouth daily., Disp: 3350 g, Rfl: 1   prednisoLONE acetate (PRED FORTE) 1 % ophthalmic suspension, Place into the right eye., Disp: , Rfl:   Allergies  Allergen Reactions   Tetracyclines & Related Other (See Comments)    UNSPECIFIED REACTION  other    I personally reviewed active problem list, medication list, allergies, family history with the patient/caregiver today.   ROS  Constitutional: Negative for fever or weight change.  Respiratory: Negative for  cough and shortness of breath.   Cardiovascular: Negative for chest pain or palpitations.  Gastrointestinal: Negative for abdominal pain, no bowel changes.  Musculoskeletal: Negative for gait problem or joint swelling.  Skin: Negative for rash.  Neurological: Negative for dizziness or headache.  No other specific complaints in a complete review of systems (except as listed in HPI above).   Objective  Vitals:   10/05/23 0842  BP: 132/74  Pulse: 78  Resp: 16  Temp: 97.9 F (36.6 C)  TempSrc: Oral  SpO2: 94%  Weight: 167 lb 12.8 oz (76.1 kg)  Height: 5\' 11"  (1.803 m)    Body mass index is 23.4 kg/m.  Physical Exam  Constitutional: Patient appears well-developed and well-nourished.  No distress.  HEENT: head atraumatic, normocephalic, pupils equal and reactive to light,, neck supple Cardiovascular: Normal rate, regular rhythm and normal heart sounds.  No murmur heard. No BLE edema. Pulmonary/Chest: Effort normal and breath sounds normal. No respiratory distress. Abdominal: Soft.  There is no tenderness. Psychiatric: Patient has a normal mood and affect. behavior is normal. Judgment and thought content normal.   Recent Results (from the past 2160 hours)  Surgical pathology     Status: None   Collection Time: 09/28/23  8:37 AM  Result Value Ref Range   SURGICAL PATHOLOGY      SURGICAL PATHOLOGY CASE: [id number] PATIENT: Huriel Stanis Surgical Pathology Report     Clinical History: pruritic rash (cf)     FINAL MICROSCOPIC DIAGNOSIS:  A.   SKIN, BIOPSY: SPONGIOTIC DERMATITIS  MICROSCOPIC DESCRIPTION: There is a superficial perivascular inflammatory infiltrate composed predominantly of lymphocytes. Lymphocytes extend to the epidermis where there is spongiosis and overlying parakeratosis.  Following review of the hematoxylin and eosin sections, a PAS stain was obtained to exclude a fungal infection.  The PAS stain is negative for fungal organisms.  The  findings are most consistent with an eczematous dermatitis such as contact or nummular dermatitis.  GROSS DESCRIPTION:  Received in formalin is a tan, soft tissue fragment that is submitted in toto.  Size: 0.2 x 0.2 x 0.1 cm, 1 block submitted. (KW, 09/28/2023)  Final Diagnosis performed by Darryll Capers, MD.   Electronically signed 10/04/2023 Technical component pe rformed at [employer]. [facility], [address] [place], [place] [address].  Professional component performed at [place]. [address], [place], [place] [address].  Immunohistochemistry Technical component (if applicable) was performed at [facility]. [address], [place], [place] [address].   IMMUNOHISTOCHEMISTRY DISCLAIMER (if applicable): Some of these immunohistochemical stains may have been developed and the performance characteristics determine by [facility]. Some may not have been cleared or approved by the U.S. Food and Drug Administration. The FDA has determined that such clearance or approval is not necessary. This test is used for clinical purposes. It should not be regarded as investigational or for research. This laboratory is certified under the Clinical Laboratory Improvement Amendments of 1988 (CLIA-88) as qualified to perform high complexity clinical laboratory testi ng.  The controls stained appropriately.   IHC stains are performed on formalin fixed, paraffin embedded tissue using a 3,3"diaminobenzidine (DAB) chromogen and Leica Bond Autostainer System. The staining intensity of the nucleus is score manually and is reported as the percentage of tumor cell nuclei demonstrating specific nuclear staining. The specimens are fixed in 10% Neutral Formalin for at least 6 hours and up to 72hrs. These tests are validated on decalcified tissue. Results should be interpreted with caution given the possibility of  false negative results on decalcified specimens. Antibody Clones are as follows ER-clone [id number], PR-clone 16, Ki67- clone [id number]. Some of these immunohistochemical stains may have been developed and the performance characteristics determined by [facility].   POCT glycosylated hemoglobin (Hb A1C)     Status: Abnormal   Collection Time: 10/05/23  8:50 AM  Result Value Ref Range   Hemoglobin A1C 7.3 (A) 4.0 - 5.6 %   HbA1c POC (<> result, manual entry)     HbA1c, POC (prediabetic range)     HbA1c, POC (controlled diabetic range)      Diabetic Foot Exam:     PHQ2/9:    09/28/2023    7:48 AM 09/04/2023    7:57 AM 05/29/2023    8:02 AM 05/03/2023   10:16 AM 01/27/2023    8:39 AM  Depression screen PHQ 2/9  Decreased Interest 0 0 0 0 0  Down, Depressed, Hopeless 0 0 0 0 0  PHQ - 2 Score 0 0 0 0 0  Altered sleeping 0 0 0 0 0  Tired, decreased energy 0 0 0 0 0  Change in appetite 0 0 0 0 0  Feeling bad or failure about yourself  0 0 0 0 0  Trouble concentrating 0 0 0 0 0  Moving slowly or fidgety/restless 0 0 0 0 0  Suicidal thoughts 0 0 0 0 0  PHQ-9 Score 0 0 0 0 0  Difficult doing work/chores Not difficult at all Not difficult at all       phq 9 is negative  Fall Risk:    09/28/2023    7:48 AM 09/04/2023    7:56 AM 05/29/2023    8:02 AM 05/03/2023   10:16 AM 01/27/2023    8:39 AM  Fall Risk   Falls in the past year? 1 1 1 1 1   Number falls in past yr: 1 1  0 0 0  Injury with Fall? 1 0 0 0 1  Risk for fall due to : Impaired balance/gait Impaired balance/gait History of fall(s) History of fall(s) No Fall Risks  Follow up Education provided;Falls evaluation completed;Falls prevention discussed Falls prevention discussed;Education provided;Falls evaluation completed Education provided;Falls evaluation completed;Falls prevention discussed Falls prevention discussed;Falls evaluation completed;Education provided Falls prevention discussed     Assessment & Plan  1.  Dyslipidemia associated with type 2 diabetes mellitus (HCC) (Primary)  - POCT glycosylated hemoglobin (Hb A1C) - JARDIANCE 10 MG TABS tablet; Take 1 tablet (10 mg total) by mouth daily.  Dispense: 90 tablet; Refill: 1 - Lipid panel - Microalbumin / creatinine urine ratio - COMPLETE METABOLIC PANEL WITH GFR  2. Stage 3a chronic kidney disease (HCC)  - JARDIANCE 10 MG TABS tablet; Take 1 tablet (10 mg total) by mouth daily.  Dispense: 90 tablet; Refill: 1 - Microalbumin / creatinine urine ratio  3. Senile purpura (HCC)  Reassurance given   4. Thrombocytopenia (HCC)  - CBC with Differential/Platelet  5. Spongiotic dermatitis  Keep visit with dermatologist , stop unnecessary supplements  6. BPH associated with nocturia  Resume cardura at lower dose  7. GERD without esophagitis  - omeprazole (PRILOSEC) 40 MG capsule; Take 1 capsule (40 mg total) by mouth daily.  Dispense: 90 capsule; Refill: 1  8. Controlled gout  On allopurinol   9. Hypertension, benign  At goal   10. Vitamin D deficiency  - VITAMIN D 25 Hydroxy (Vit-D Deficiency, Fractures)  11. Low serum vitamin B12  - B12 and Folate Panel

## 2023-10-06 ENCOUNTER — Encounter: Payer: Self-pay | Admitting: Family Medicine

## 2023-10-06 LAB — MICROALBUMIN / CREATININE URINE RATIO
Creatinine, Urine: 59 mg/dL (ref 20–320)
Microalb Creat Ratio: 139 mg/g{creat} — ABNORMAL HIGH (ref <30)
Microalb, Ur: 8.2 mg/dL

## 2023-10-06 LAB — LIPID PANEL
Cholesterol: 121 mg/dL (ref <200)
HDL: 49 mg/dL (ref >=40)
LDL Cholesterol (Calc): 44 mg/dL
Non-HDL Cholesterol (Calc): 72 mg/dL (ref <130)
Total CHOL/HDL Ratio: 2.5 (calc) (ref <5.0)
Triglycerides: 222 mg/dL — ABNORMAL HIGH (ref <150)

## 2023-10-06 LAB — CBC WITH DIFFERENTIAL/PLATELET
Absolute Lymphocytes: 1741 {cells}/uL (ref 850–3900)
Absolute Monocytes: 653 {cells}/uL (ref 200–950)
Basophils Absolute: 38 {cells}/uL (ref 0–200)
Basophils Relative: 0.6 %
Eosinophils Absolute: 493 {cells}/uL (ref 15–500)
Eosinophils Relative: 7.7 %
HCT: 41 % (ref 38.5–50.0)
Hemoglobin: 13.6 g/dL (ref 13.2–17.1)
MCH: 33.3 pg — ABNORMAL HIGH (ref 27.0–33.0)
MCHC: 33.2 g/dL (ref 32.0–36.0)
MCV: 100.2 fL — ABNORMAL HIGH (ref 80.0–100.0)
MPV: 12.2 fL (ref 7.5–12.5)
Monocytes Relative: 10.2 %
Neutro Abs: 3475 {cells}/uL (ref 1500–7800)
Neutrophils Relative %: 54.3 %
Platelets: 141 10*3/uL (ref 140–400)
RBC: 4.09 10*6/uL — ABNORMAL LOW (ref 4.20–5.80)
RDW: 13.5 % (ref 11.0–15.0)
Total Lymphocyte: 27.2 %
WBC: 6.4 10*3/uL (ref 3.8–10.8)

## 2023-10-06 LAB — COMPLETE METABOLIC PANEL WITH GFR
AG Ratio: 1.6 (calc) (ref 1.0–2.5)
ALT: 22 U/L (ref 9–46)
AST: 20 U/L (ref 10–35)
Albumin: 3.9 g/dL (ref 3.6–5.1)
Alkaline phosphatase (APISO): 46 U/L (ref 35–144)
BUN/Creatinine Ratio: 20 (calc) (ref 6–22)
BUN: 26 mg/dL — ABNORMAL HIGH (ref 7–25)
CO2: 26 mmol/L (ref 20–32)
Calcium: 9 mg/dL (ref 8.6–10.3)
Chloride: 107 mmol/L (ref 98–110)
Creat: 1.29 mg/dL — ABNORMAL HIGH (ref 0.70–1.22)
Globulin: 2.5 g/dL (ref 1.9–3.7)
Glucose, Bld: 137 mg/dL — ABNORMAL HIGH (ref 65–99)
Potassium: 4.5 mmol/L (ref 3.5–5.3)
Sodium: 141 mmol/L (ref 135–146)
Total Bilirubin: 0.5 mg/dL (ref 0.2–1.2)
Total Protein: 6.4 g/dL (ref 6.1–8.1)
eGFR: 52 mL/min/{1.73_m2} — ABNORMAL LOW (ref >=60)

## 2023-10-06 LAB — VITAMIN D 25 HYDROXY (VIT D DEFICIENCY, FRACTURES): Vit D, 25-Hydroxy: 57 ng/mL (ref 30–100)

## 2023-10-06 LAB — B12 AND FOLATE PANEL
Folate: >24 ng/mL
Vitamin B-12: >2000 pg/mL — ABNORMAL HIGH (ref 200–1100)

## 2023-10-23 ENCOUNTER — Other Ambulatory Visit: Payer: Self-pay | Admitting: Family Medicine

## 2023-10-23 DIAGNOSIS — N401 Enlarged prostate with lower urinary tract symptoms: Secondary | ICD-10-CM

## 2023-10-24 NOTE — Telephone Encounter (Signed)
Requested medications are due for refill today.  yes  Requested medications are on the active medications list.  yes  Last refill. 09/28/2023 #30 0 rf  Future visit scheduled.   yes  Notes to clinic.  New medication to this pt. Please review for refill.    Requested Prescriptions  Pending Prescriptions Disp Refills   doxazosin (CARDURA) 1 MG tablet [Pharmacy Med Name: Doxazosin Mesylate Oral Tablet 1 MG] 30 tablet 11    Sig: TAKE 1 TABLET EVERY DAY     Cardiovascular:  Alpha Blockers Passed - 10/24/2023  2:24 PM      Passed - Last BP in normal range    BP Readings from Last 1 Encounters:  10/05/23 132/74         Passed - Valid encounter within last 6 months    Recent Outpatient Visits           2 weeks ago Dyslipidemia associated with type 2 diabetes mellitus [facility])   [place] [facility] Alba Cory, MD   3 weeks ago Pruritic rash   [facility] Alba Cory, MD   1 month ago Pruritic rash   [facility] [place], Danna Hefty, MD   4 months ago Controlled type 2 diabetes mellitus with microalbuminuria, with long-term current use of insulin [facility])   [place] [facility] Alba Cory, MD   5 months ago Cutaneous abscess of other site   [facility] Alba Cory, MD       Future Appointments             In 1 month Elie Goody, MD [facility]   In 2 months Alba Cory, MD [facility]

## 2023-10-27 ENCOUNTER — Ambulatory Visit: Payer: Medicare HMO | Admitting: Nurse Practitioner

## 2023-10-30 ENCOUNTER — Telehealth: Payer: Self-pay

## 2023-10-30 DIAGNOSIS — L570 Actinic keratosis: Secondary | ICD-10-CM | POA: Diagnosis not present

## 2023-10-30 DIAGNOSIS — L2089 Other atopic dermatitis: Secondary | ICD-10-CM | POA: Diagnosis not present

## 2023-10-30 DIAGNOSIS — D2261 Melanocytic nevi of right upper limb, including shoulder: Secondary | ICD-10-CM | POA: Diagnosis not present

## 2023-10-30 DIAGNOSIS — D2272 Melanocytic nevi of left lower limb, including hip: Secondary | ICD-10-CM | POA: Diagnosis not present

## 2023-10-30 DIAGNOSIS — D225 Melanocytic nevi of trunk: Secondary | ICD-10-CM | POA: Diagnosis not present

## 2023-10-30 DIAGNOSIS — D2271 Melanocytic nevi of right lower limb, including hip: Secondary | ICD-10-CM | POA: Diagnosis not present

## 2023-10-30 DIAGNOSIS — D2262 Melanocytic nevi of left upper limb, including shoulder: Secondary | ICD-10-CM | POA: Diagnosis not present

## 2023-10-30 DIAGNOSIS — L821 Other seborrheic keratosis: Secondary | ICD-10-CM | POA: Diagnosis not present

## 2023-10-30 NOTE — Telephone Encounter (Signed)
 notified

## 2023-10-30 NOTE — Telephone Encounter (Signed)
 Copied from CRM [id number]. Topic: General - Other >> Oct 30, 2023  8:39 AM Tracy [name] wrote: Patient has concerns that some information in his chart may be incorrect or belongs to another patient. Patient states that the notes in the message from 10/06/23 state he was a 89 yo male and is is not 49. Please review and advise patient.

## 2023-11-13 ENCOUNTER — Other Ambulatory Visit: Payer: Self-pay | Admitting: Family Medicine

## 2023-11-13 DIAGNOSIS — Z794 Long term (current) use of insulin: Secondary | ICD-10-CM

## 2023-11-13 DIAGNOSIS — E1169 Type 2 diabetes mellitus with other specified complication: Secondary | ICD-10-CM

## 2023-11-22 LAB — HM DIABETES EYE EXAM

## 2023-12-05 ENCOUNTER — Ambulatory Visit: Payer: Medicare HMO | Admitting: Dermatology

## 2023-12-14 ENCOUNTER — Other Ambulatory Visit: Payer: Self-pay | Admitting: Family Medicine

## 2023-12-14 DIAGNOSIS — I129 Hypertensive chronic kidney disease with stage 1 through stage 4 chronic kidney disease, or unspecified chronic kidney disease: Secondary | ICD-10-CM

## 2024-01-04 ENCOUNTER — Ambulatory Visit: Payer: Self-pay | Admitting: Family Medicine

## 2024-01-10 ENCOUNTER — Ambulatory Visit: Admitting: Family Medicine

## 2024-01-10 ENCOUNTER — Encounter: Payer: Self-pay | Admitting: Family Medicine

## 2024-01-10 VITALS — BP 124/74 | HR 76 | Resp 16 | Ht 71.0 in | Wt 166.7 lb

## 2024-01-10 DIAGNOSIS — L308 Other specified dermatitis: Secondary | ICD-10-CM

## 2024-01-10 DIAGNOSIS — E1122 Type 2 diabetes mellitus with diabetic chronic kidney disease: Secondary | ICD-10-CM

## 2024-01-10 DIAGNOSIS — N1831 Chronic kidney disease, stage 3a: Secondary | ICD-10-CM | POA: Diagnosis not present

## 2024-01-10 DIAGNOSIS — I129 Hypertensive chronic kidney disease with stage 1 through stage 4 chronic kidney disease, or unspecified chronic kidney disease: Secondary | ICD-10-CM | POA: Diagnosis not present

## 2024-01-10 MED ORDER — AMLODIPINE BESY-BENAZEPRIL HCL 10-20 MG PO CAPS
1.0000 | ORAL_CAPSULE | Freq: Every day | ORAL | 0 refills | Status: DC
Start: 2024-01-10 — End: 2024-04-01

## 2024-01-10 NOTE — Progress Notes (Signed)
 Name: Tracy G Laspina Jr.   MRN: [id number]    DOB: April 06, 1930   Date:01/10/2024       Progress Note  Subjective  Chief Complaint  Chief Complaint  Patient presents with   Medical Management of Chronic Issues   Discussed the use of AI scribe software for clinical note transcription with the patient, who gave verbal consent to proceed.  History of Present Illness Tracy Tellinghuisen. is a 89 year old male who presents for diabetes management and eczema follow-up.  He has been managing his diabetes with a continuous glucose monitor, showing an A1c of 7.2% over the past three months. He is compliant with the sensor usage, using it 89 out of 90 days. His glucose levels are in range 68% of the time, with less than 1% below range, 28% high, and 4% very high. His average glucose over 90 days is 164 mg/dL, and over the past 14 days, it has improved to an average of 149 mg/dL with an J4N of 8.2%. He experiences occasional high glucose levels after eating, which he manages by drinking water. No excessive hunger or thirst, and he eats mostly because it is time to eat. He has maintained his weight around 166 lbs since January.  He has a history of persistent and severe eczema. He previously tried three different topical treatments without success. Recently, he started on Dupixent, receiving two initial shots in the office and subsequent doses every other week since November 24, 2023. He reports significant improvement, stating the eczema is 'almost a hundred percent better' and currently 'seventy percent better' than two months ago. The eczema had previously caused severe itching, especially at night, affecting his sleep. His back, which was previously covered in rashes, is no longer red or itchy.  He experiences episodes of dizziness and nausea upon standing, which he attributes to his blood pressure medication. He takes Cardura  for prostate issues and amlodipine -benazepril  for blood pressure management. He  exercises every morning and experiences dizziness when getting up from a lying position.  His family history includes the recent death of his 60 year old son from liver cancer and a daughter who died three to four years ago from cirrhosis of the liver. Both children had a history of alcoholism.    Patient Active Problem List   Diagnosis Date Noted   Coronary artery disease 03/17/2022   Hypertrophy foliate papillae 03/17/2022   Stage 3a chronic kidney disease (HCC) 09/22/2021   Numbness and tingling of both feet 10/23/2020   Chronic ischemic heart disease 10/19/2020   Hypertension associated with stage 3a chronic kidney disease due to type 2 diabetes mellitus (HCC) 10/19/2020   Anemia of chronic disease 11/12/2019   Hearing loss on right 11/26/2018   Labyrinthitis of right ear 10/25/2018   Spinal stenosis of lumbar region with neurogenic claudication 05/03/2017   Left hip pain 07/28/2016   ALT (SGPT) level raised 07/13/2015   At risk for falling 07/13/2015   Arthritis of hand, degenerative 07/13/2015   Gout 07/13/2015   Glaucoma 07/13/2015   Diabetic neuropathy (HCC) 07/13/2015   Hypertension 04/09/2015   Type 2 diabetes mellitus with microalbuminuria, with long-term current use of insulin  (HCC) 04/09/2015   Dyslipidemia 04/09/2015   Hypertriglyceridemia 04/09/2015   Benign fibroma of prostate 04/09/2015   Acid reflux 04/09/2015   History of melanoma excision 04/09/2015   History of squamous cell carcinoma 05/23/2013   H/O adenomatous polyp of colon 08/25/2010   History of MI (myocardial infarction) 08/27/1979  Past Surgical History:  Procedure Laterality Date   CATARACT EXTRACTION W/ INTRAOCULAR LENS  IMPLANT, BILATERAL     CIRCUMCISION     COLONOSCOPY WITH PROPOFOL  N/A 11/17/2021   Procedure: COLONOSCOPY WITH PROPOFOL ;  Surgeon: Luke Salaam, MD;  Location: [facility] END[facility]COPY;  Service: Gastroenterology;  Laterality: N/A;   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/10/2019   GANGLION CYST  EXCISION     LEFT HAND   LUMBAR LAMINECTOMY/DECOMPRESSION MICRODISCECTOMY  09/13/2017   Procedure: BILATERAL Lumbar three-four , Left  Lumbar four-five  AND Left  L5-S1, LAMINECTOMY/FORAMINOTOMY;  Surgeon: Gearl Keens, MD;  Location: [facility] OR;  Service: Neurosurgery;;   MELANOMA EXCISION  07/2011   TONSILLECTOMY     VASECTOMY      Family History  Problem Relation Age of Onset   Heart disease Mother    Diabetes Mother        type 2   Angina Mother    Heart disease Brother    Pneumonia Father    Heart attack Father    Alcoholism Daughter    Cirrhosis Daughter     Social History   Tobacco Use   Smoking status: Former    Current packs/day: 0.00    Average packs/day: 2.0 packs/day for 13.0 years (26.0 ttl pk-yrs)    Types: Cigarettes    Start date: 58    Quit date: 1963    Years since quitting: 62.3   Smokeless tobacco: Never   Tobacco comments:    smoking cessation materials not required  Substance Use Topics   Alcohol use: Yes    Alcohol/week: 2.0 standard drinks of alcohol    Types: 2 Shots of liquor per week    Comment: DAILY     Current Outpatient Medications:    Accu-Chek FastClix Lancets MISC, CHECK  BLOOD  SUGAR TWICE DAILY, Disp: 204 each, Rfl: 1   allopurinol  (ZYLOPRIM ) 300 MG tablet, TAKE 1 TABLET EVERY DAY, Disp: 90 tablet, Rfl: 3   amLODipine -benazepril  (LOTREL) 10-40 MG capsule, TAKE 1 CAPSULE EVERY DAY, Disp: 90 capsule, Rfl: 0   aspirin  81 MG tablet, Take 81 mg by mouth., Disp: , Rfl:    atorvastatin  (LIPITOR) 10 MG tablet, TAKE 1 TABLET DAILY AT 6 PM., Disp: 90 tablet, Rfl: 3   Blood Glucose Monitoring Suppl (ACCU-CHEK NANO SMARTVIEW) W/DEVICE KIT, USE AS DIRECTED, Disp: 1 kit, Rfl: 0   Cholecalciferol  (VITAMIN D ) 2000 units tablet, Take 2,000 Units by mouth daily., Disp: , Rfl:    clobetasol ointment (TEMOVATE) 0.05 %, Apply topically 2 (two) times daily., Disp: , Rfl:    Continuous Glucose Sensor (DEXCOM G7 SENSOR) MISC, 1 each by Does not apply route as  directed., Disp: 3 each, Rfl: 5   dorzolamide -timolol  (COSOPT) 22.3-6.8 MG/ML ophthalmic solution, Place 1 drop into the left eye daily at 2 PM., Disp: , Rfl:    doxazosin  (CARDURA ) 1 MG tablet, TAKE 1 TABLET EVERY DAY, Disp: 90 tablet, Rfl: 1   DROPLET PEN NEEDLES 31G X 5 MM MISC, USE AS NEEDED., Disp: 100 each, Rfl: 3   DUPIXENT 300 MG/2ML SOAJ, , Disp: , Rfl:    finasteride  (PROSCAR ) 5 MG tablet, Take 1 tablet (5 mg total) by mouth at bedtime., Disp: 90 tablet, Rfl: 1   fluticasone  (FLONASE ) 50 MCG/ACT nasal spray, 2 sprays daily., Disp: , Rfl:    JARDIANCE  10 MG TABS tablet, Take 1 tablet (10 mg total) by mouth daily., Disp: 90 tablet, Rfl: 1   Lancet Devices (TRUEDRAW LANCING DEVICE) MISC, 1  each by Does not apply route once for 1 dose., Disp: 1 each, Rfl: 0   LANTUS  SOLOSTAR 100 UNIT/ML Solostar Pen, INJECT 46 UNITS INTO THE SKIN DAILY., Disp: 45 mL, Rfl: 3   Multiple Vitamins-Minerals (CENTRUM SILVER 50+MEN) TABS, Take 1 tablet by mouth daily., Disp: , Rfl:    omeprazole  (PRILOSEC) 40 MG capsule, Take 1 capsule (40 mg total) by mouth daily., Disp: 90 capsule, Rfl: 1   polyethylene glycol powder (GLYCOLAX /MIRALAX ) 17 GM/SCOOP powder, Take 17 g by mouth daily., Disp: 3350 g, Rfl: 1   prednisoLONE acetate (PRED FORTE) 1 % ophthalmic suspension, Place into the right eye., Disp: , Rfl:   Allergies  Allergen Reactions   Tetracyclines & Related Other (See Comments)    UNSPECIFIED REACTION  other    I personally reviewed active problem list, medication list, allergies with the patient/caregiver today.   ROS  Ten systems reviewed and is negative except as mentioned in HPI    Objective Physical Exam Constitutional: Patient appears well-developed and well-nourished. No distress.  HEENT: head atraumatic, normocephalic, pupils equal and reactive to light, neck supple Cardiovascular: Normal rate, regular rhythm and normal heart sounds.  No murmur heard. No BLE edema. Pulmonary/Chest:  Effort normal and breath sounds normal. No respiratory distress. Abdominal: Soft.  There is no tenderness. Psychiatric: Patient has a normal mood and affect. behavior is normal. Judgment and thought content normal.   Vitals:   01/10/24 0842  BP: 124/74  Pulse: 76  Resp: 16  SpO2: 96%  Weight: 166 lb 11.2 oz (75.6 kg)  Height: 5\' 11"  (1.803 m)    Body mass index is 23.25 kg/m.  Recent Results (from the past 2160 hours)  HM DIABETES EYE EXAM     Status: None   Collection Time: 11/22/23 11:48 AM  Result Value Ref Range   HM Diabetic Eye Exam No Retinopathy No Retinopathy    Comment: Abstracted by HIM     PHQ2/9:    01/10/2024    8:43 AM 09/28/2023    7:48 AM 09/04/2023    7:57 AM 05/29/2023    8:02 AM 05/03/2023   10:16 AM  Depression screen PHQ 2/9  Decreased Interest 0 0 0 0 0  Down, Depressed, Hopeless 0 0 0 0 0  PHQ - 2 Score 0 0 0 0 0  Altered sleeping 0 0 0 0 0  Tired, decreased energy 0 0 0 0 0  Change in appetite 0 0 0 0 0  Feeling bad or failure about yourself  0 0 0 0 0  Trouble concentrating 0 0 0 0 0  Moving slowly or fidgety/restless 0 0 0 0 0  Suicidal thoughts 0 0 0 0 0  PHQ-9 Score 0 0 0 0 0  Difficult doing work/chores Not difficult at all Not difficult at all Not difficult at all      phq 9 is negative  Fall Risk:    01/10/2024    8:43 AM 09/28/2023    7:48 AM 09/04/2023    7:56 AM 05/29/2023    8:02 AM 05/03/2023   10:16 AM  Fall Risk   Falls in the past year? 0 1 1 1 1   Number falls in past yr: 0 1 1 0 0  Injury with Fall? 0 1 0 0 0  Risk for fall due to : No Fall Risks Impaired balance/gait Impaired balance/gait History of fall(s) History of fall(s)  Follow up Falls prevention discussed;Education provided;Falls evaluation completed Education provided;Falls evaluation  completed;Falls prevention discussed Falls prevention discussed;Education provided;Falls evaluation completed Education provided;Falls evaluation completed;Falls prevention  discussed Falls prevention discussed;Falls evaluation completed;Education provided    Assessment & Plan Essential hypertension Blood pressure controlled at 124/74 mmHg. Dizziness and nausea suggest orthostatic hypotension, possibly due to current medication regimen. Adjusted amlodipine /benazepril  to reduce symptoms and fall risk. - Decrease amlodipine /benazepril  dose from 10/40 mg to 10/20 mg. - Send new prescription to mail order pharmacy. - Follow up in one month to reassess blood pressure and symptoms.  Type 2 diabetes mellitus Glucose control is good with A1c of 7.2% over 90 days based on GCM data  and 6.9% over 14 days. High compliance with continuous glucose monitoring. Occasional postprandial highs but overall satisfactory management. - Continue current diabetes management regimen.  Eczema Chronic eczema improved by 70% with Dupixent. Previous treatments ineffective. Dupixent significantly reduced symptoms and improved quality of life. - Continue Dupixent 300 mg every other week. - Follow up with dermatologist as needed.

## 2024-01-25 ENCOUNTER — Other Ambulatory Visit: Payer: Self-pay | Admitting: Family Medicine

## 2024-01-25 DIAGNOSIS — N401 Enlarged prostate with lower urinary tract symptoms: Secondary | ICD-10-CM

## 2024-02-09 ENCOUNTER — Encounter: Payer: Self-pay | Admitting: Family Medicine

## 2024-02-09 ENCOUNTER — Other Ambulatory Visit: Payer: Self-pay | Admitting: Family Medicine

## 2024-02-09 ENCOUNTER — Ambulatory Visit: Admitting: Family Medicine

## 2024-02-09 VITALS — BP 134/72 | HR 80 | Resp 16 | Ht 71.0 in | Wt 165.6 lb

## 2024-02-09 DIAGNOSIS — E559 Vitamin D deficiency, unspecified: Secondary | ICD-10-CM

## 2024-02-09 DIAGNOSIS — E1169 Type 2 diabetes mellitus with other specified complication: Secondary | ICD-10-CM | POA: Diagnosis not present

## 2024-02-09 DIAGNOSIS — N401 Enlarged prostate with lower urinary tract symptoms: Secondary | ICD-10-CM

## 2024-02-09 DIAGNOSIS — E1122 Type 2 diabetes mellitus with diabetic chronic kidney disease: Secondary | ICD-10-CM | POA: Diagnosis not present

## 2024-02-09 DIAGNOSIS — E538 Deficiency of other specified B group vitamins: Secondary | ICD-10-CM

## 2024-02-09 DIAGNOSIS — L308 Other specified dermatitis: Secondary | ICD-10-CM

## 2024-02-09 DIAGNOSIS — Z23 Encounter for immunization: Secondary | ICD-10-CM

## 2024-02-09 DIAGNOSIS — E785 Hyperlipidemia, unspecified: Secondary | ICD-10-CM

## 2024-02-09 DIAGNOSIS — M109 Gout, unspecified: Secondary | ICD-10-CM | POA: Diagnosis not present

## 2024-02-09 DIAGNOSIS — I129 Hypertensive chronic kidney disease with stage 1 through stage 4 chronic kidney disease, or unspecified chronic kidney disease: Secondary | ICD-10-CM

## 2024-02-09 DIAGNOSIS — D696 Thrombocytopenia, unspecified: Secondary | ICD-10-CM

## 2024-02-09 DIAGNOSIS — N1831 Chronic kidney disease, stage 3a: Secondary | ICD-10-CM | POA: Diagnosis not present

## 2024-02-09 DIAGNOSIS — L309 Dermatitis, unspecified: Secondary | ICD-10-CM | POA: Insufficient documentation

## 2024-02-09 LAB — POCT GLYCOSYLATED HEMOGLOBIN (HGB A1C): Hemoglobin A1C: 7.1 % — AB (ref 4.0–5.6)

## 2024-02-09 NOTE — Progress Notes (Signed)
 Name: Tracy G Ranieri Jr.   MRN: [id number]    DOB: 02-01-1930   Date:02/09/2024       Progress Note  Subjective  Chief Complaint  Chief Complaint  Patient presents with   Medical Management of Chronic Issues    DM   Discussed the use of AI scribe software for clinical note transcription with the patient, who gave verbal consent to proceed.  History of Present Illness Orry Sigl. is a 89 year old male with diabetes and hypertension who presents for a follow-up visit.  He is primarily focused on his diabetes management. His A1c has improved from 7.3% in January to 7.1% currently. He uses a Dexcom device for glucose monitoring but faces challenges with the Clarity app due to phone compatibility issues. He experienced a hypoglycemic episode at 4 AM with a glucose level of 60 mg/dL, which he managed with orange juice and a banana. He generally feels his glucose levels are well-controlled, with morning readings around 120 mg/dL. His current medications include Lantus  42 units and Jardiance  10 mg. He has associated HTN, CKI stage IIIa and dyslipidemia. He is compliant with medications and denies side effects  Regarding hypertension, he reports no dizziness or nausea upon standing since October. amlodipine -benazepril  for blood pressure, having reduced his dose from 10/40 to 10/20. He does not regularly check his blood pressure at home but feels well.  He has a history of gout but has not experienced an attack in years and continues to take allopurinol  daily. He reports increased thirst in the mornings and tries to maintain adequate hydration throughout the day.  His eczema has improved significantly with Dupixent, taken every other week, resulting in no visible spots and significant relief from itching.  He remains active, playing golf once a week and walking daily for 20-25 minutes without leg pain. He experienced fatigue after playing golf twice in one week but otherwise reports no  issues.  He has a history of B12 deficiency and is currently taking a B12 supplement once a day. He also takes atorvastatin  10 mg for hyperlipidemia and finasteride  and cardura  for BPH, with no nocturia reported.    Patient Active Problem List   Diagnosis Date Noted   Thrombocytopenia (HCC) 02/09/2024   Low serum vitamin B12 02/09/2024   Vitamin D  deficiency 02/09/2024   BPH associated with nocturia 02/09/2024   Eczema 02/09/2024   Coronary artery disease 03/17/2022   Hypertrophy foliate papillae 03/17/2022   Stage 3a chronic kidney disease (HCC) 09/22/2021   Numbness and tingling of both feet 10/23/2020   Chronic ischemic heart disease 10/19/2020   Hypertension associated with stage 3a chronic kidney disease due to type 2 diabetes mellitus (HCC) 10/19/2020   Anemia of chronic disease 11/12/2019   Hearing loss on right 11/26/2018   Labyrinthitis of right ear 10/25/2018   Spinal stenosis of lumbar region with neurogenic claudication 05/03/2017   Left hip pain 07/28/2016   At risk for falling 07/13/2015   Arthritis of hand, degenerative 07/13/2015   Controlled gout 07/13/2015   Glaucoma 07/13/2015   Diabetic neuropathy (HCC) 07/13/2015   Type 2 diabetes mellitus with microalbuminuria, with long-term current use of insulin  (HCC) 04/09/2015   Dyslipidemia 04/09/2015   Benign fibroma of prostate 04/09/2015   Acid reflux 04/09/2015   History of melanoma excision 04/09/2015   History of squamous cell carcinoma 05/23/2013   H/O adenomatous polyp of colon 08/25/2010   History of MI (myocardial infarction) 08/27/1979    Past Surgical  History:  Procedure Laterality Date   CATARACT EXTRACTION W/ INTRAOCULAR LENS  IMPLANT, BILATERAL     CIRCUMCISION     COLONOSCOPY WITH PROPOFOL  N/A 11/17/2021   Procedure: COLONOSCOPY WITH PROPOFOL ;  Surgeon: Luke Salaam, MD;  Location: [facility] ENDOSCOPY;  Service: Gastroenterology;  Laterality: N/A;   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/10/2019   GANGLION CYST  EXCISION     LEFT HAND   LUMBAR LAMINECTOMY/DECOMPRESSION MICRODISCECTOMY  09/13/2017   Procedure: BILATERAL Lumbar three-four , Left  Lumbar four-five  AND Left  L5-S1, LAMINECTOMY/FORAMINOTOMY;  Surgeon: Gearl Keens, MD;  Location: [facility] OR;  Service: Neurosurgery;;   MELANOMA EXCISION  07/2011   TONSILLECTOMY     VASECTOMY      Family History  Problem Relation Age of Onset   Heart disease Mother    Diabetes Mother        type 2   Angina Mother    Pneumonia Father    Heart attack Father    Heart disease Brother    Alcoholism Daughter    Cirrhosis Daughter    Cancer Son 92       liver cancer    Social History   Tobacco Use   Smoking status: Former    Current packs/day: 0.00    Average packs/day: 2.0 packs/day for 13.0 years (26.0 ttl pk-yrs)    Types: Cigarettes    Start date: 88    Quit date: 1963    Years since quitting: 62.4   Smokeless tobacco: Never   Tobacco comments:    smoking cessation materials not required  Substance Use Topics   Alcohol use: Yes    Alcohol/week: 2.0 standard drinks of alcohol    Types: 2 Shots of liquor per week    Comment: DAILY     Current Outpatient Medications:    Accu-Chek FastClix Lancets MISC, CHECK  BLOOD  SUGAR TWICE DAILY, Disp: 204 each, Rfl: 1   allopurinol  (ZYLOPRIM ) 300 MG tablet, TAKE 1 TABLET EVERY DAY, Disp: 90 tablet, Rfl: 3   amLODipine -benazepril  (LOTREL) 10-20 MG capsule, Take 1 capsule by mouth daily., Disp: 90 capsule, Rfl: 0   aspirin  81 MG tablet, Take 81 mg by mouth., Disp: , Rfl:    atorvastatin  (LIPITOR) 10 MG tablet, TAKE 1 TABLET DAILY AT 6 PM., Disp: 90 tablet, Rfl: 3   Blood Glucose Monitoring Suppl (ACCU-CHEK NANO SMARTVIEW) W/DEVICE KIT, USE AS DIRECTED, Disp: 1 kit, Rfl: 0   Cholecalciferol  (VITAMIN D ) 2000 units tablet, Take 2,000 Units by mouth daily., Disp: , Rfl:    clobetasol ointment (TEMOVATE) 0.05 %, Apply topically 2 (two) times daily., Disp: , Rfl:    Continuous Glucose Sensor (DEXCOM G7  SENSOR) MISC, 1 each by Does not apply route as directed., Disp: 3 each, Rfl: 5   dorzolamide -timolol  (COSOPT) 22.3-6.8 MG/ML ophthalmic solution, Place 1 drop into the left eye daily at 2 PM., Disp: , Rfl:    doxazosin  (CARDURA ) 1 MG tablet, TAKE 1 TABLET EVERY DAY, Disp: 90 tablet, Rfl: 1   DROPLET PEN NEEDLES 31G X 5 MM MISC, USE AS NEEDED., Disp: 100 each, Rfl: 3   Dupilumab (DUPIXENT) 300 MG/2ML SOAJ, Inject 2 mLs into the skin every 14 (fourteen) days., Disp: , Rfl:    finasteride  (PROSCAR ) 5 MG tablet, TAKE 1 TABLET AT BEDTIME, Disp: 90 tablet, Rfl: 0   fluticasone  (FLONASE ) 50 MCG/ACT nasal spray, 2 sprays daily., Disp: , Rfl:    JARDIANCE  10 MG TABS tablet, Take 1 tablet (10 mg total)  by mouth daily., Disp: 90 tablet, Rfl: 1   Lancet Devices (TRUEDRAW LANCING DEVICE) MISC, 1 each by Does not apply route once for 1 dose., Disp: 1 each, Rfl: 0   LANTUS  SOLOSTAR 100 UNIT/ML Solostar Pen, INJECT 46 UNITS INTO THE SKIN DAILY., Disp: 45 mL, Rfl: 3   Multiple Vitamins-Minerals (CENTRUM SILVER 50+MEN) TABS, Take 1 tablet by mouth daily., Disp: , Rfl:    omeprazole  (PRILOSEC) 40 MG capsule, Take 1 capsule (40 mg total) by mouth daily., Disp: 90 capsule, Rfl: 1   polyethylene glycol powder (GLYCOLAX /MIRALAX ) 17 GM/SCOOP powder, Take 17 g by mouth daily., Disp: 3350 g, Rfl: 1   prednisoLONE acetate (PRED FORTE) 1 % ophthalmic suspension, Place into the right eye., Disp: , Rfl:   Allergies  Allergen Reactions   Tetracyclines & Related Other (See Comments)    UNSPECIFIED REACTION  other    I personally reviewed active problem list, medication list, allergies with the patient/caregiver today.   ROS  Ten systems reviewed and is negative except as mentioned in HPI    Objective Physical Exam Constitutional: Patient appears well-developed and well-nourished.  No distress.  HEENT: head atraumatic, normocephalic, pupils equal and reactive to light,, neck supple, throat within normal  limits Cardiovascular: Normal rate, regular rhythm and normal heart sounds.  No murmur heard. No BLE edema. Pulmonary/Chest: Effort normal and breath sounds normal. No respiratory distress. Abdominal: Soft.  There is no tenderness. Psychiatric: Patient has a normal mood and affect. behavior is normal. Judgment and thought content normal.     Vitals:   02/09/24 1051  BP: 134/72  Pulse: 80  Resp: 16  SpO2: 95%  Weight: 165 lb 9.6 oz (75.1 kg)  Height: 5\' 11"  (1.803 m)    Body mass index is 23.1 kg/m.  Recent Results (from the past 2160 hours)  HM DIABETES EYE EXAM     Status: None   Collection Time: 11/22/23 11:48 AM  Result Value Ref Range   HM Diabetic Eye Exam No Retinopathy No Retinopathy    Comment: Abstracted by HIM  POCT glycosylated hemoglobin (Hb A1C)     Status: Abnormal   Collection Time: 02/09/24 10:58 AM  Result Value Ref Range   Hemoglobin A1C 7.1 (A) 4.0 - 5.6 %   HbA1c POC (<> result, manual entry)     HbA1c, POC (prediabetic range)     HbA1c, POC (controlled diabetic range)      Diabetic Foot Exam:  Diabetic foot exam was performed with the following findings:   No deformities, ulcerations, or other skin breakdown Normal sensation of 10g monofilament Intact posterior tibialis and dorsalis pedis pulses      PHQ2/9:    02/09/2024   10:43 AM 01/10/2024    8:43 AM 09/28/2023    7:48 AM 09/04/2023    7:57 AM 05/29/2023    8:02 AM  Depression screen PHQ 2/9  Decreased Interest 0 0 0 0 0  Down, Depressed, Hopeless 0 0 0 0 0  PHQ - 2 Score 0 0 0 0 0  Altered sleeping 0 0 0 0 0  Tired, decreased energy 0 0 0 0 0  Change in appetite 0 0 0 0 0  Feeling bad or failure about yourself  0 0 0 0 0  Trouble concentrating 0 0 0 0 0  Moving slowly or fidgety/restless 0 0 0 0 0  Suicidal thoughts 0 0 0 0 0  PHQ-9 Score 0 0 0 0 0  Difficult doing work/chores  Not difficult at all Not difficult at all Not difficult at all Not difficult at all     phq 9 is  negative  Fall Risk:    02/09/2024   10:43 AM 01/10/2024    8:43 AM 09/28/2023    7:48 AM 09/04/2023    7:56 AM 05/29/2023    8:02 AM  Fall Risk   Falls in the past year? 0 0 1 1 1   Number falls in past yr: 0 0 1 1 0  Injury with Fall? 0 0 1 0 0  Risk for fall due to : No Fall Risks No Fall Risks Impaired balance/gait Impaired balance/gait History of fall(s)  Follow up Falls prevention discussed;Education provided;Falls evaluation completed Falls prevention discussed;Education provided;Falls evaluation completed Education provided;Falls evaluation completed;Falls prevention discussed Falls prevention discussed;Education provided;Falls evaluation completed Education provided;Falls evaluation completed;Falls prevention discussed      Assessment & Plan Type 2 diabetes mellitus with associated dyslipidemia and HTN  Type 2 diabetes with recent hypoglycemic episode. A1c improved to 7.1%. Managed hypoglycemia with dietary intervention. Morning thirst likely due to Jardiance . No frequent hypoglycemia. Discussed Clarity app for glucose monitoring. - Continue Lantus  42 units. - Continue Jardiance  10 mg. - Monitor blood glucose levels. - Adjust diet to prevent nocturnal hypoglycemia. - Use Clarity app for glucose monitoring. - Consult with family regarding app compatibility.  Chronic kidney disease, stage 3a Chronic kidney disease stage 3a with GFR of 52. Current management appropriate. - Continue Jardiance  10 mg. - Continue ACE inhibitor. - Monitor renal function.  Essential hypertension Essential hypertension well-controlled. Blood pressure stable at 134/72 mmHg. - Continue current antihypertensive regimen. Lotrel 10/20 mg  - Monitor blood pressure at home.  Eczema Eczema well-controlled with Dupixent. No lesions or itching. - Continue Dupixent every other week.  Benign prostatic hyperplasia Benign prostatic hyperplasia managed effectively. No urinary symptoms. - Continue  Cardura . - Continue finasteride .  Thrombocytopenia We will continue to monitor, last level improved  Neurogenic claudication Neurogenic claudication resolved.  General Health Maintenance Agreed to receive newer pneumonia vaccine for illness prevention. - Administer newer pneumonia vaccine.

## 2024-02-22 ENCOUNTER — Ambulatory Visit: Payer: Self-pay

## 2024-02-22 DIAGNOSIS — E1169 Type 2 diabetes mellitus with other specified complication: Secondary | ICD-10-CM

## 2024-02-22 DIAGNOSIS — Z0001 Encounter for general adult medical examination with abnormal findings: Secondary | ICD-10-CM | POA: Diagnosis not present

## 2024-02-22 DIAGNOSIS — Z0000 Encounter for general adult medical examination without abnormal findings: Secondary | ICD-10-CM

## 2024-02-22 NOTE — Patient Instructions (Addendum)
 Tracy [name] , Thank you for taking time out of your busy schedule to complete your Annual Wellness Visit with me. I enjoyed our conversation and look forward to speaking with you again next year. I, as well as your care team,  appreciate your ongoing commitment to your health goals. Please review the following plan we discussed and let me know if I can assist you in the future.   Follow up Visits: Next Medicare AWV with our clinical staff:   03/06/25 @ 12:40 PM BY PHONE Have you seen your provider in the last 6 months (3 months if uncontrolled diabetes)? Yes   Clinician Recommendations:  Aim for 30 minutes of exercise or brisk walking, 6-8 glasses of water, and 5 servings of fruits and vegetables each day. TAKE CARE!      This is a list of the screening recommended for you and due dates:  Health Maintenance  Topic Date Due   Complete foot exam   01/27/2024   COVID-19 Vaccine (4 - 2024-25 season) 02/25/2024*   Flu Shot  04/12/2024   Hemoglobin A1C  08/11/2024   Eye exam for diabetics  11/21/2024   Medicare Annual Wellness Visit  02/21/2025   DTaP/Tdap/Td vaccine (2 - Td or Tdap) 11/29/2026   Pneumococcal Vaccine for age over 48  Completed   Zoster (Shingles) Vaccine  Completed   HPV Vaccine  Aged Out   Meningitis B Vaccine  Aged Out  *Topic was postponed. The date shown is not the original due date.    Advanced directives: (In Chart) A copy of your advanced directives are scanned into your chart should your provider ever need it. Advance Care Planning is important because it:  [x]  Makes sure you receive the medical care that is consistent with your values, goals, and preferences  [x]  It provides guidance to your family and loved ones and reduces their decisional burden about whether or not they are making the right decisions based on your wishes.  Follow the link provided in your after visit summary or read over the paperwork we have mailed to you to help you started getting your  Advance Directives in place. If you need assistance in completing these, please reach out to us  so that we can help you!

## 2024-02-22 NOTE — Progress Notes (Signed)
 Subjective:   Tracy Willert. is a 89 y.o. who presents for a Medicare Wellness preventive visit.  As a reminder, Annual Wellness Visits don't include a physical exam, and some assessments may be limited, especially if this visit is performed virtually. We may recommend an in-person follow-up visit with your provider if needed.  Visit Complete: Virtual I connected with  Tracy G Frankum Jr. on 02/22/24 by a audio enabled telemedicine application and verified that I am speaking with the correct person using two identifiers.  Patient Location: Home  Provider Location: Office/Clinic  I discussed the limitations of evaluation and management by telemedicine. The patient expressed understanding and agreed to proceed.  Vital Signs: Because this visit was a virtual/telehealth visit, some criteria may be missing or patient reported. Any vitals not documented were not able to be obtained and vitals that have been documented are patient reported.  VideoDeclined- This patient declined Librarian, academic. Therefore the visit was completed with audio only.  Persons Participating in Visit: Patient.  AWV Questionnaire: No: Patient Medicare AWV questionnaire was not completed prior to this visit.  Cardiac Risk Factors include: advanced age (>68men, >36 women);diabetes mellitus;hypertension;male gender;dyslipidemia     Objective:    There were no vitals filed for this visit. There is no height or weight on file to calculate BMI.     02/22/2024    3:33 PM 12/16/2022   10:53 AM 12/14/2021   10:39 AM 11/17/2021    9:26 AM 11/17/2020    8:49 AM 11/14/2019    9:00 AM 11/09/2018    9:04 AM  Advanced Directives  Does Patient Have a Medical Advance Directive? Yes Yes Yes Yes Yes Yes Yes   Type of Estate agent of [place];Living will  Living will  Healthcare Power of [place];Living will Healthcare Power of [place];Living will Healthcare Power of  [place];Living will  Does patient want to make changes to medical advance directive? No - Patient declined        Copy of Healthcare Power of Attorney in Chart? Yes - validated most recent copy scanned in chart (See row information)    No - copy requested No - copy requested No - copy requested      Data saved with a previous flowsheet row definition    Current Medications (verified) Outpatient Encounter Medications as of 02/22/2024  Medication Sig   Accu-Chek FastClix Lancets MISC CHECK  BLOOD  SUGAR TWICE DAILY   allopurinol  (ZYLOPRIM ) 300 MG tablet TAKE 1 TABLET EVERY DAY   amLODipine -benazepril  (LOTREL) 10-20 MG capsule Take 1 capsule by mouth daily.   aspirin  81 MG tablet Take 81 mg by mouth.   atorvastatin  (LIPITOR) 10 MG tablet TAKE 1 TABLET DAILY AT 6 PM.   Blood Glucose Monitoring Suppl (ACCU-CHEK NANO SMARTVIEW) W/DEVICE KIT USE AS DIRECTED   Cholecalciferol  (VITAMIN D ) 2000 units tablet Take 2,000 Units by mouth daily.   clobetasol ointment (TEMOVATE) 0.05 % Apply topically 2 (two) times daily.   Continuous Glucose Sensor (DEXCOM G7 SENSOR) MISC 1 each by Does not apply route as directed.   dorzolamide -timolol  (COSOPT) 22.3-6.8 MG/ML ophthalmic solution Place 1 drop into the left eye daily at 2 PM.   doxazosin  (CARDURA ) 1 MG tablet TAKE 1 TABLET EVERY DAY   DROPLET PEN NEEDLES 31G X 5 MM MISC USE AS NEEDED.   Dupilumab (DUPIXENT) 300 MG/2ML SOAJ Inject 2 mLs into the skin every 14 (fourteen) days.   finasteride  (PROSCAR ) 5 MG tablet  TAKE 1 TABLET AT BEDTIME   fluticasone  (FLONASE ) 50 MCG/ACT nasal spray 2 sprays daily. TAKES PRN   JARDIANCE  10 MG TABS tablet Take 1 tablet (10 mg total) by mouth daily.   Lancet Devices (TRUEDRAW LANCING DEVICE) MISC 1 each by Does not apply route once for 1 dose.   LANTUS  SOLOSTAR 100 UNIT/ML Solostar Pen INJECT 46 UNITS INTO THE SKIN DAILY.   Multiple Vitamins-Minerals (CENTRUM SILVER 50+MEN) TABS Take 1 tablet by mouth daily.   omeprazole   (PRILOSEC) 40 MG capsule Take 1 capsule (40 mg total) by mouth daily.   polyethylene glycol powder (GLYCOLAX /MIRALAX ) 17 GM/SCOOP powder Take 17 g by mouth daily.   prednisoLONE acetate (PRED FORTE) 1 % ophthalmic suspension Place into the right eye.   No facility-administered encounter medications on file as of 02/22/2024.    Allergies (verified) Tetracyclines & related   History: Past Medical History:  Diagnosis Date   Arthritis    Cancer (HCC)    SKIN CANCER melanoma 2 sites and multiple squamous cell   Diabetes mellitus without complication (HCC)    GERD (gastroesophageal reflux disease)    Glaucoma    Hyperlipidemia    Hypertension    Myocardial infarction (HCC)    08/27/1979   Vertigo    Past Surgical History:  Procedure Laterality Date   CATARACT EXTRACTION W/ INTRAOCULAR LENS  IMPLANT, BILATERAL     CIRCUMCISION     COLONOSCOPY WITH PROPOFOL  N/A 11/17/2021   Procedure: COLONOSCOPY WITH PROPOFOL ;  Surgeon: Luke Salaam, MD;  Location: [facility] ENDOSCOPY;  Service: Gastroenterology;  Laterality: N/A;   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/10/2019   GANGLION CYST EXCISION     LEFT HAND   LUMBAR LAMINECTOMY/DECOMPRESSION MICRODISCECTOMY  09/13/2017   Procedure: BILATERAL Lumbar three-four , Left  Lumbar four-five  AND Left  L5-S1, LAMINECTOMY/FORAMINOTOMY;  Surgeon: Gearl Keens, MD;  Location: [facility] OR;  Service: Neurosurgery;;   MELANOMA EXCISION  07/2011   TONSILLECTOMY     VASECTOMY     Family History  Problem Relation Age of Onset   Heart disease Mother    Diabetes Mother        type 2   Angina Mother    Pneumonia Father    Heart attack Father    Heart disease Brother    Alcoholism Daughter    Cirrhosis Daughter    Cancer Son 52       liver cancer   Social History   Socioeconomic History   Marital status: Married    Spouse name: Haskell Linker   Number of children: 4   Years of education: Not on file   Highest education level: Bachelor's degree (e.g., BA, AB, BS)  Occupational  History   Occupation: Retired  Tobacco Use   Smoking status: Former    Current packs/day: 0.00    Average packs/day: 2.0 packs/day for 13.0 years (26.0 ttl pk-yrs)    Types: Cigarettes    Start date: 39    Quit date: 1963    Years since quitting: 62.4   Smokeless tobacco: Never   Tobacco comments:    smoking cessation materials not required  Vaping Use   Vaping status: Never Used  Substance and Sexual Activity   Alcohol use: Yes    Alcohol/week: 2.0 standard drinks of alcohol    Types: 2 Shots of liquor per week    Comment: DAILY   Drug use: No   Sexual activity: Not Currently  Other Topics Concern   Not on file  Social  History Narrative   Daughter Amalia Badder passed away 10-08-18.   Social Drivers of Corporate investment banker Strain: Low Risk  (02/22/2024)   Overall Financial Resource Strain (CARDIA)    Difficulty of Paying Living Expenses: Not hard at all  Food Insecurity: No Food Insecurity (02/22/2024)   Hunger Vital Sign    Worried About Running Out of Food in the Last Year: Never true    Ran Out of Food in the Last Year: Never true  Transportation Needs: No Transportation Needs (02/22/2024)   PRAPARE - Administrator, Civil Service (Medical): No    Lack of Transportation (Non-Medical): No  Physical Activity: Sufficiently Active (02/22/2024)   Exercise Vital Sign    Days of Exercise per Week: 5 days    Minutes of Exercise per Session: 60 min  Stress: No Stress Concern Present (02/22/2024)   [employer] of Occupational Health - Occupational Stress Questionnaire    Feeling of Stress: Not at all  Social Connections: Moderately Isolated (02/22/2024)   Social Connection and Isolation Panel    Frequency of Communication with Friends and Family: More than three times a week    Frequency of Social Gatherings with Friends and Family: Three times a week    Attends Religious Services: Never    Active Member of Clubs or Organizations: No    Attends Tax inspector Meetings: Never    Marital Status: Married    Tobacco Counseling Counseling given: Not Answered Tobacco comments: smoking cessation materials not required    Clinical Intake:  Pre-visit preparation completed: Yes  Pain : No/denies pain     BMI - recorded: 23 Nutritional Status: BMI of 19-24  Normal Nutritional Risks: None Diabetes: Yes CBG done?: No Did pt. bring in CBG monitor from home?: No  Lab Results  Component Value Date   HGBA1C 7.1 (A) 02/09/2024   HGBA1C 7.3 (A) 10/05/2023   HGBA1C 6.8 (A) 05/29/2023     How often do you need to have someone help you when you read instructions, pamphlets, or other written materials from your doctor or pharmacy?: 1 - Never  Interpreter Needed?: No  Information entered by :: Dellie Fergusson, LPN   Activities of Daily Living    02/22/2024    3:35 PM 05/29/2023    8:02 AM  In your present state of health, do you have any difficulty performing the following activities:  Hearing? 1 1  Vision? 0 0  Difficulty concentrating or making decisions? 0 0  Walking or climbing stairs? 0 0  Dressing or bathing? 0 0  Doing errands, shopping? 0 0  Preparing Food and eating ? N   Using the Toilet? N   In the past six months, have you accidently leaked urine? N   Do you have problems with loss of bowel control? N   Managing your Medications? N   Managing your Finances? N   Housekeeping or managing your Housekeeping? N     Patient Care Team: Sowles, Krichna, MD as PCP - General (Family Medicine) Dasher, Margette Sheldon, MD (Dermatology) Senora Dame, MD as Referring Physician (Endocrinology) Lesly Raspberry, MD (Otolaryngology) Clemetine Cypher, [place] as Consulting Physician (Podiatry) Dingeldein, Landon Pinion, MD (Ophthalmology) Luke Salaam, MD as Consulting Physician (Gastroenterology)  I have updated your Care Teams any recent Medical Services you may have received from other providers in the past year.     Assessment:    This is a routine wellness examination for [facility].  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 DINGELDEIN   Goals Addressed             This Visit's Progress    DIET - EAT MORE FRUITS AND VEGETABLES         Depression Screen     02/22/2024    3:31 PM 02/09/2024   10:43 AM 01/10/2024    8:43 AM 09/28/2023    7:48 AM 09/04/2023    7:57 AM 05/29/2023    8:02 AM 05/03/2023   10:16 AM  PHQ 2/9 Scores  PHQ - 2 Score 0 0 0 0 0 0 0  PHQ- 9 Score 0 0 0 0 0 0 0    Fall Risk     02/22/2024    3:34 PM 02/09/2024   10:43 AM 01/10/2024    8:43 AM 09/28/2023    7:48 AM 09/04/2023    7:56 AM  Fall Risk   Falls in the past year? 1 0 0 1 1  Number falls in past yr: 1 0 0 1 1  Injury with Fall? 0 0 0 1 0  Risk for fall due to :  No Fall Risks No Fall Risks Impaired balance/gait Impaired balance/gait  Follow up Falls evaluation completed;Falls prevention discussed Falls prevention discussed;Education provided;Falls evaluation completed Falls prevention discussed;Education provided;Falls evaluation completed Education provided;Falls evaluation completed;Falls prevention discussed Falls prevention discussed;Education provided;Falls evaluation completed    MEDICARE RISK AT HOME:  Medicare Risk at Home Any stairs in or around the home?: Yes If so, are there any without handrails?: No Home free of loose throw rugs in walkways, pet beds, electrical cords, etc?: Yes Adequate lighting in your home to reduce risk of falls?: Yes Life alert?: No Use of a cane, walker or w/c?: No Grab bars in the bathroom?: Yes Shower chair or bench in shower?: No Elevated toilet seat or a handicapped toilet?: No  TIMED UP AND GO:  Was the test performed?  No  Cognitive Function: 6CIT completed        02/22/2024    3:37 PM 12/16/2022   10:57 AM 11/14/2019    9:02 AM 11/09/2018    9:11 AM 11/07/2017     9:09 AM  6CIT Screen  What Year? 0 points 0 points 0 points 0 points 0 points  What month? 0 points 0 points 0 points 0 points 0 points  What time? 0 points 0 points 0 points 0 points 0 points  Count back from 20 0 points 0 points 0 points 0 points 0 points  Months in reverse 0 points 0 points 0 points 0 points 0 points  Repeat phrase 0 points 0 points 0 points 0 points 2 points  Total Score 0 points 0 points 0 points 0 points 2 points    Immunizations Immunization History  Administered Date(s) Administered   Fluad Quad(high Dose 65+) 07/09/2019, 06/17/2020, 06/14/2021, 09/28/2022   Fluad Trivalent(High Dose 65+) 06/23/2023   Influenza, High Dose Seasonal PF 07/07/2014, 07/28/2016, 06/27/2017, 07/26/2018   Influenza, Seasonal, Injecte, Preservative Fre 07/16/2012   Influenza,inj,Quad PF,6+ Mos 06/18/2013, 06/30/2015   Influenza-Unspecified 07/13/2002, 07/21/2003, 07/12/2004, 06/12/2005, 06/26/2006, 07/18/2007, 07/13/2008, 07/22/2009, 07/16/2015   PFIZER(Purple Top)SARS-COV-2 Vaccination 09/27/2019, 10/18/2019, 06/08/2020   PNEUMOCOCCAL CONJUGATE-20 02/09/2024   Pneumococcal Conjugate-13 07/07/2014   Pneumococcal Polysaccharide-23 07/13/2001, 07/26/2010   Tdap 11/28/2016   Zoster Recombinant(Shingrix) 12/02/2020, 06/07/2021    Screening Tests Health Maintenance  Topic Date  Due   FOOT EXAM  01/27/2024   COVID-19 Vaccine (4 - 2024-25 season) 02/25/2024 (Originally 05/14/2023)   INFLUENZA VACCINE  04/12/2024   HEMOGLOBIN A1C  08/11/2024   OPHTHALMOLOGY EXAM  11/21/2024   Medicare Annual Wellness (AWV)  02/21/2025   DTaP/Tdap/Td (2 - Td or Tdap) 11/29/2026   Pneumococcal Vaccine: 50+ Years  Completed   Zoster Vaccines- Shingrix  Completed   HPV VACCINES  Aged Out   Meningococcal B Vaccine  Aged Out    Health Maintenance  Health Maintenance Due  Topic Date Due   FOOT EXAM  01/27/2024   Health Maintenance Items Addressed: UP TO DATE ON SHOTS EXCEPT COVID  Additional  Screening:  Vision Screening: Recommended annual ophthalmology exams for early detection of glaucoma and other disorders of the eye. Would you like a referral to an eye doctor? No    Dental Screening: Recommended annual dental exams for proper oral hygiene  Community Resource Referral / Chronic Care Management: CRR required this visit?  No   CCM required this visit?  No   Plan:    I have personally reviewed and noted the following in the patient's chart:   Medical and social history Use of alcohol, tobacco or illicit drugs  Current medications and supplements including opioid prescriptions. Patient is not currently taking opioid prescriptions. Functional ability and status Nutritional status Physical activity Advanced directives List of other physicians Hospitalizations, surgeries, and ER visits in previous 12 months Vitals Screenings to include cognitive, depression, and falls Referrals and appointments  In addition, I have reviewed and discussed with patient certain preventive protocols, quality metrics, and best practice recommendations. A written personalized care plan for preventive services as well as general preventive health recommendations were provided to patient.   Pinky Bright, LPN   03/11/5283   After Visit Summary: (MyChart) Due to this being a telephonic visit, the after visit summary with patients personalized plan was offered to patient via MyChart   Notes: Nothing significant to report at this time.

## 2024-03-17 ENCOUNTER — Other Ambulatory Visit: Payer: Self-pay | Admitting: Family Medicine

## 2024-03-17 DIAGNOSIS — N401 Enlarged prostate with lower urinary tract symptoms: Secondary | ICD-10-CM

## 2024-03-17 DIAGNOSIS — K219 Gastro-esophageal reflux disease without esophagitis: Secondary | ICD-10-CM

## 2024-03-17 DIAGNOSIS — E1169 Type 2 diabetes mellitus with other specified complication: Secondary | ICD-10-CM

## 2024-03-17 DIAGNOSIS — N1831 Chronic kidney disease, stage 3a: Secondary | ICD-10-CM

## 2024-03-18 ENCOUNTER — Other Ambulatory Visit: Payer: Self-pay | Admitting: Family Medicine

## 2024-03-18 DIAGNOSIS — E1122 Type 2 diabetes mellitus with diabetic chronic kidney disease: Secondary | ICD-10-CM

## 2024-04-01 ENCOUNTER — Other Ambulatory Visit: Payer: Self-pay | Admitting: Family Medicine

## 2024-04-01 DIAGNOSIS — N1831 Chronic kidney disease, stage 3a: Secondary | ICD-10-CM

## 2024-04-01 DIAGNOSIS — E1122 Type 2 diabetes mellitus with diabetic chronic kidney disease: Secondary | ICD-10-CM

## 2024-04-01 DIAGNOSIS — E1169 Type 2 diabetes mellitus with other specified complication: Secondary | ICD-10-CM

## 2024-04-07 ENCOUNTER — Other Ambulatory Visit: Payer: Self-pay | Admitting: Family Medicine

## 2024-04-07 DIAGNOSIS — N401 Enlarged prostate with lower urinary tract symptoms: Secondary | ICD-10-CM

## 2024-04-11 ENCOUNTER — Other Ambulatory Visit: Payer: Self-pay | Admitting: Family Medicine

## 2024-04-11 DIAGNOSIS — K219 Gastro-esophageal reflux disease without esophagitis: Secondary | ICD-10-CM

## 2024-04-11 DIAGNOSIS — N401 Enlarged prostate with lower urinary tract symptoms: Secondary | ICD-10-CM

## 2024-04-23 DIAGNOSIS — H1131 Conjunctival hemorrhage, right eye: Secondary | ICD-10-CM | POA: Diagnosis not present

## 2024-05-04 ENCOUNTER — Emergency Department
Admission: EM | Admit: 2024-05-04 | Discharge: 2024-05-04 | Disposition: A | Discharge status: Home / Self Care | Attending: Emergency Medicine | Admitting: Emergency Medicine

## 2024-05-04 ENCOUNTER — Other Ambulatory Visit: Payer: Self-pay

## 2024-05-04 DIAGNOSIS — E1165 Type 2 diabetes mellitus with hyperglycemia: Secondary | ICD-10-CM | POA: Diagnosis not present

## 2024-05-04 DIAGNOSIS — I10 Essential (primary) hypertension: Secondary | ICD-10-CM | POA: Insufficient documentation

## 2024-05-04 DIAGNOSIS — R739 Hyperglycemia, unspecified: Secondary | ICD-10-CM

## 2024-05-04 LAB — CBC
HCT: 38.6 % — ABNORMAL LOW (ref 39.0–52.0)
Hemoglobin: 13.4 g/dL (ref 13.0–17.0)
MCH: 34.2 pg — ABNORMAL HIGH (ref 26.0–34.0)
MCHC: 34.7 g/dL (ref 30.0–36.0)
MCV: 98.5 fL (ref 80.0–100.0)
Platelets: 139 K/uL — ABNORMAL LOW (ref 150–400)
RBC: 3.92 MIL/uL — ABNORMAL LOW (ref 4.22–5.81)
RDW: 14 % (ref 11.5–15.5)
WBC: 6.7 K/uL (ref 4.0–10.5)
nRBC: 0 % (ref 0.0–0.2)

## 2024-05-04 LAB — BASIC METABOLIC PANEL WITH GFR
Anion gap: 10 (ref 5–15)
BUN: 24 mg/dL — ABNORMAL HIGH (ref 8–23)
CO2: 21 mmol/L — ABNORMAL LOW (ref 22–32)
Calcium: 8.7 mg/dL — ABNORMAL LOW (ref 8.9–10.3)
Chloride: 107 mmol/L (ref 98–111)
Creatinine, Ser: 1.07 mg/dL (ref 0.61–1.24)
GFR, Estimated: >60 mL/min (ref >60)
Glucose, Bld: 253 mg/dL — ABNORMAL HIGH (ref 70–99)
Potassium: 4 mmol/L (ref 3.5–5.1)
Sodium: 138 mmol/L (ref 135–145)

## 2024-05-04 LAB — URINALYSIS, ROUTINE W REFLEX MICROSCOPIC
Bacteria, UA: NONE SEEN
Bilirubin Urine: NEGATIVE
Glucose, UA: >=500 mg/dL — AB
Hgb urine dipstick: NEGATIVE
Ketones, ur: 5 mg/dL — AB
Leukocytes,Ua: NEGATIVE
Nitrite: NEGATIVE
Protein, ur: NEGATIVE mg/dL
RBC / HPF: 0 RBC/hpf (ref 0–5)
Specific Gravity, Urine: 1.023 (ref 1.005–1.030)
WBC, UA: 0 WBC/hpf (ref 0–5)
pH: 5 (ref 5.0–8.0)

## 2024-05-04 LAB — CBG MONITORING, ED
Glucose-Capillary: 266 mg/dL — ABNORMAL HIGH (ref 70–99)
Glucose-Capillary: 106 mg/dL — ABNORMAL HIGH (ref 70–99)

## 2024-05-04 MED ORDER — SODIUM CHLORIDE 0.9 % IV BOLUS
1000.0000 mL | Freq: Once | INTRAVENOUS | Status: AC
  Administered 2024-05-04: 1000 mL via INTRAVENOUS

## 2024-05-04 MED ORDER — INSULIN ASPART 100 UNIT/ML IJ SOLN
4.0000 [IU] | Freq: Once | INTRAMUSCULAR | Status: AC
  Administered 2024-05-04: 4 [IU] via SUBCUTANEOUS
  Filled 2024-05-04: qty 4

## 2024-05-04 NOTE — ED Provider Notes (Signed)
 Tracy [name] Provider Note    Event Date/Time   First MD Initiated Contact with Patient 05/04/24 1637     (approximate)   History   Hyperglycemia   HPI  Tracy [name]. is a 89 y.o. male with a history of diabetes, hypertension, CKI, and dyslipidemia who presents with hyperglycemia since last night.  The patient states that he ate a pasta dish and since that time his sugar has been over 200, which is very unusual for him.  The patient states that today he has been feeling somewhat lightheaded and weak.  He denies any nausea or vomiting.  He has no chest pain or difficulty breathing.  He has no urinary symptoms.  He takes Lantus  and Jardiance  and said he took his normal doses.  He denies any other acute symptoms.  I reviewed the past medical records.  The patient was last seen by Dr. Glenard from primary care on 5/30 for management of his diabetes.  At that time, he was on Lantus  and Jardiance .   Physical Exam   Triage Vital Signs: ED Triage Vitals  Encounter Vitals Group     BP 05/04/24 1623 (!) 143/75     Girls Systolic BP Percentile --      Girls Diastolic BP Percentile --      Boys Systolic BP Percentile --      Boys Diastolic BP Percentile --      Pulse Rate 05/04/24 1623 84     Resp 05/04/24 1623 20     Temp 05/04/24 1623 98.3 F (36.8 C)     Temp Source 05/04/24 1623 Oral     SpO2 05/04/24 1623 96 %     Weight 05/04/24 1621 165 lb (74.8 kg)     Height 05/04/24 1621 5' 10 (1.778 m)     Head Circumference --      Peak Flow --      Pain Score 05/04/24 1619 4     Pain Loc --      Pain Education --      Exclude from Growth Chart --     Most recent vital signs: Vitals:   05/04/24 2115 05/04/24 2124  BP: (!) 159/91 (!) 159/91  Pulse: (!) 54 (!) 54  Resp: 14 16  Temp:  98.3 F (36.8 C)  SpO2: 97% 97%     General: Awake, no distress.  CV:  Good peripheral perfusion.  Resp:  Normal effort.  Abd:  No distention.  Other:  Moist  mucous membranes.   ED Results / Procedures / Treatments   Labs (all labs ordered are listed, but only abnormal results are displayed) Labs Reviewed  BASIC METABOLIC PANEL WITH GFR - Abnormal; Notable for the following components:      Result Value   CO2 21 (*)    Glucose, Bld 253 (*)    BUN 24 (*)    Calcium  8.7 (*)    All other components within normal limits  CBC - Abnormal; Notable for the following components:   RBC 3.92 (*)    HCT 38.6 (*)    MCH 34.2 (*)    Platelets 139 (*)    All other components within normal limits  URINALYSIS, ROUTINE W REFLEX MICROSCOPIC - Abnormal; Notable for the following components:   Color, Urine STRAW (*)    APPearance CLEAR (*)    Glucose, UA >=500 (*)    Ketones, ur 5 (*)    All other components within  normal limits  CBG MONITORING, ED - Abnormal; Notable for the following components:   Glucose-Capillary 266 (*)    All other components within normal limits  CBG MONITORING, ED - Abnormal; Notable for the following components:   Glucose-Capillary 106 (*)    All other components within normal limits  CBG MONITORING, ED     EKG  ED ECG REPORT I, Tracy [name], the attending physician, personally viewed and interpreted this ECG.  Date: 05/04/2024 EKG Time: 1805 Rate: 56 Rhythm: normal sinus rhythm QRS Axis: normal Intervals: LAFB ST/T Wave abnormalities: normal Narrative Interpretation: no evidence of acute ischemia   RADIOLOGY    PROCEDURES:  Critical Care performed: No  Procedures   MEDICATIONS ORDERED IN ED: Medications  sodium chloride  0.9 % bolus 1,000 mL (0 mLs Intravenous Stopped 05/04/24 2123)  insulin  aspart (novoLOG ) injection 4 Units (4 Units Subcutaneous Given 05/04/24 1808)     IMPRESSION / MDM / ASSESSMENT AND PLAN / ED COURSE  I reviewed the triage vital signs and the nursing notes.  89 year old male with PMH as noted above presents with elevated blood glucose since eating dinner last night.   He reports some lightheadedness but denies other acute symptoms.  On exam his vital signs are normal.  He is well-appearing.  Physical exam is unremarkable for acute findings.  Differential diagnosis includes, but is not limited to, uncomplicated hyperglycemia, less likely hyperglycemic crisis, UTI, dehydration.  We will give fluids and a low-dose of insulin , obtain lab workup, EKG, and reassess.  Patient's presentation is most consistent with acute complicated illness / injury requiring diagnostic workup.  ----------------------------------------- 8:51 PM on 05/04/2024 -----------------------------------------  The blood glucose is improved to 100.  Lab workup is unremarkable.  BMP shows normal anion gap.  CBC shows no leukocytosis.  Urinalysis demonstrates no signs of infection.  The patient is asymptomatic.  FINAL CLINICAL IMPRESSION(S) / ED DIAGNOSES   Final diagnoses:  Hyperglycemia     Rx / DC Orders   ED Discharge Orders     None        Note:  This document was prepared using Dragon voice recognition software and may include unintentional dictation errors.    [name] Waylon, MD 05/04/24 [phone]

## 2024-05-04 NOTE — ED Triage Notes (Signed)
 Pt to ED from home, drove self to ED. Pt states ate pasta last night and his BG has been reading >200 since 1130pm last night. Pt is ambulatory with steady gait, alert and oriented with unlabored respirations and dry skin. Denies recent illness.  CBG is 266. Pt is slightly HOH.

## 2024-05-04 NOTE — Discharge Instructions (Addendum)
 Take your Lantus  today as normal.  Check your blood sugar regularly over the next few days.  Make an appointment to follow-up with your primary care provider.  Return to the ER for new, worsening, or persistent severe abnormal blood sugar levels, weakness or lightheadedness, or any other new or worsening symptoms that concern you.

## 2024-05-04 NOTE — ED Notes (Signed)
Attempted to call lab with no answer.

## 2024-05-23 ENCOUNTER — Telehealth: Payer: Self-pay

## 2024-05-23 DIAGNOSIS — M109 Gout, unspecified: Secondary | ICD-10-CM

## 2024-05-23 MED ORDER — ALLOPURINOL 300 MG PO TABS: 300.0000 mg | ORAL_TABLET | Freq: Every day | ORAL | 1 refills | Status: DC

## 2024-05-23 NOTE — Telephone Encounter (Signed)
 Copied from CRM [id number]. Topic: Clinical - Prescription Issue >> May 23, 2024  8:48 AM Pinkey ORN wrote: Reason for CRM: allopurinol  (ZYLOPRIM ) 300 MG tablet >> May 23, 2024  8:50 AM Pinkey ORN wrote: [facility] states they sent a fax over to refill patient's allopurinol  (ZYLOPRIM ) 300 MG tablet but hasn't gotten a response back. Please follow up at [phone] / fax # [phone]

## 2024-05-23 NOTE — Telephone Encounter (Signed)
 [facility]

## 2024-05-29 DIAGNOSIS — Z961 Presence of intraocular lens: Secondary | ICD-10-CM | POA: Diagnosis not present

## 2024-05-29 DIAGNOSIS — Z947 Corneal transplant status: Secondary | ICD-10-CM | POA: Diagnosis not present

## 2024-05-29 DIAGNOSIS — H401132 Primary open-angle glaucoma, bilateral, moderate stage: Secondary | ICD-10-CM | POA: Diagnosis not present

## 2024-06-05 DIAGNOSIS — H6123 Impacted cerumen, bilateral: Secondary | ICD-10-CM | POA: Diagnosis not present

## 2024-06-05 DIAGNOSIS — H903 Sensorineural hearing loss, bilateral: Secondary | ICD-10-CM | POA: Diagnosis not present

## 2024-06-14 ENCOUNTER — Encounter: Payer: Self-pay | Admitting: Family Medicine

## 2024-06-14 ENCOUNTER — Ambulatory Visit: Admitting: Family Medicine

## 2024-06-14 VITALS — BP 120/70 | HR 82 | Resp 16 | Ht 70.0 in | Wt 163.6 lb

## 2024-06-14 DIAGNOSIS — E1129 Type 2 diabetes mellitus with other diabetic kidney complication: Secondary | ICD-10-CM

## 2024-06-14 DIAGNOSIS — D696 Thrombocytopenia, unspecified: Secondary | ICD-10-CM

## 2024-06-14 DIAGNOSIS — N401 Enlarged prostate with lower urinary tract symptoms: Secondary | ICD-10-CM

## 2024-06-14 DIAGNOSIS — E1169 Type 2 diabetes mellitus with other specified complication: Secondary | ICD-10-CM | POA: Diagnosis not present

## 2024-06-14 DIAGNOSIS — E1122 Type 2 diabetes mellitus with diabetic chronic kidney disease: Secondary | ICD-10-CM

## 2024-06-14 DIAGNOSIS — Z7984 Long term (current) use of oral hypoglycemic drugs: Secondary | ICD-10-CM

## 2024-06-14 DIAGNOSIS — Z23 Encounter for immunization: Secondary | ICD-10-CM

## 2024-06-14 DIAGNOSIS — E785 Hyperlipidemia, unspecified: Secondary | ICD-10-CM | POA: Diagnosis not present

## 2024-06-14 DIAGNOSIS — K219 Gastro-esophageal reflux disease without esophagitis: Secondary | ICD-10-CM

## 2024-06-14 DIAGNOSIS — I129 Hypertensive chronic kidney disease with stage 1 through stage 4 chronic kidney disease, or unspecified chronic kidney disease: Secondary | ICD-10-CM

## 2024-06-14 DIAGNOSIS — M109 Gout, unspecified: Secondary | ICD-10-CM | POA: Diagnosis not present

## 2024-06-14 DIAGNOSIS — N1831 Chronic kidney disease, stage 3a: Secondary | ICD-10-CM | POA: Diagnosis not present

## 2024-06-14 DIAGNOSIS — M7552 Bursitis of left shoulder: Secondary | ICD-10-CM

## 2024-06-14 LAB — POCT GLYCOSYLATED HEMOGLOBIN (HGB A1C): Hemoglobin A1C: 6.9 % — AB (ref 4.0–5.6)

## 2024-06-14 MED ORDER — AMLODIPINE BESY-BENAZEPRIL HCL 10-20 MG PO CAPS: 1.0000 | ORAL_CAPSULE | Freq: Every day | ORAL | 1 refills | Status: DC

## 2024-06-14 MED ORDER — FINASTERIDE 5 MG PO TABS: 5.0000 mg | ORAL_TABLET | Freq: Every day | ORAL | 1 refills | Status: AC

## 2024-06-14 MED ORDER — ATORVASTATIN CALCIUM 10 MG PO TABS: 10.0000 mg | ORAL_TABLET | Freq: Every day | ORAL | 3 refills | Status: AC

## 2024-06-14 MED ORDER — OMEPRAZOLE 20 MG PO CPDR: 20.0000 mg | DELAYED_RELEASE_CAPSULE | Freq: Every day | ORAL | 1 refills | Status: AC

## 2024-06-14 MED ORDER — EMPAGLIFLOZIN 25 MG PO TABS: 25.0000 mg | ORAL_TABLET | Freq: Every day | ORAL | 1 refills | Status: AC

## 2024-06-14 MED ORDER — CELECOXIB 100 MG PO CAPS: 100.0000 mg | ORAL_CAPSULE | Freq: Two times a day (BID) | ORAL | 0 refills | Status: DC

## 2024-06-14 NOTE — Progress Notes (Signed)
 Name: Tracy G Valko Jr.   MRN: [id number]    DOB: 02/29/1930   Date:06/14/2024       Progress Note  Subjective  Chief Complaint  Chief Complaint  Patient presents with   Medical Management of Chronic Issues   Discussed the use of AI scribe software for clinical note transcription with the patient, who gave verbal consent to proceed.  History of Present Illness Tracy Neubert. is a 89 year old male who presents for a regular follow-up visit.  He consumes alcohol daily, typically two drinks per day, consisting of Scotch on weekends and rum with sugar-free lemon juice during the week. He has maintained this habit for a long time without experiencing dizziness or significant issues, and his A1c is under control.  He experiences shoulder pain, specifically in the deltoid area, described as soreness when leaning out. He notes that topical medications have not been effective for his shoulder pain. He plays golf every Saturday and lifts weights daily, but the shoulder pain affects his ability to perform certain movements.  He has a history of arthritis and has been taking calcium  supplements for about a year. He inquires whether additional calcium  intake could worsen his arthritis.  He has a history of reflux, which has been well-controlled with omeprazole  40 mg He has not experienced symptoms as long as he takes the medication daily.  He has a history of diabetes, currently managed with Jardiance  10 mg and Lantus  42 units. His recent A1c was 6.9%, and he reports no recent hypoglycemic episodes.  He has a history of hypertension associated with diabetes and chronic kidney disease, managed with amlodipine  and benazepril . No chest pain, palpitations, or lightheadedness.  He has a history of hyperlipidemia, managed with atorvastatin  10 mg, and reports no side effects.  He has a history of benign prostatic hyperplasia, managed with finasteride , and reports improvement in symptoms.  He has  a history of gout, managed with allopurinol , and reports no recent gout attacks.   Date of Download: 06/14/2024 % Time CGM is active: 93 % Average Glucose: 171  mg/dL Glucose Management Indicator:  Dexcom Glucose Variability: 30.2  (goal <36%) Time in Goal:  - Time in range 70-180: 65  % - Time above range: 35 % - Time below range: 0 %    Patient Active Problem List   Diagnosis Date Noted   Thrombocytopenia 02/09/2024   Low serum vitamin B12 02/09/2024   Vitamin D  deficiency 02/09/2024   BPH associated with nocturia 02/09/2024   Eczema 02/09/2024   Coronary artery disease 03/17/2022   Hypertrophy foliate papillae 03/17/2022   Stage 3a chronic kidney disease (HCC) 09/22/2021   Numbness and tingling of both feet 10/23/2020   Chronic ischemic heart disease 10/19/2020   Hypertension associated with stage 3a chronic kidney disease due to type 2 diabetes mellitus (HCC) 10/19/2020   Anemia of chronic disease 11/12/2019   Hearing loss on right 11/26/2018   Labyrinthitis of right ear 10/25/2018   Spinal stenosis of lumbar region with neurogenic claudication 05/03/2017   Left hip pain 07/28/2016   At risk for falling 07/13/2015   Arthritis of hand, degenerative 07/13/2015   Controlled gout 07/13/2015   Glaucoma 07/13/2015   Diabetic neuropathy (HCC) 07/13/2015   Type 2 diabetes mellitus with microalbuminuria, with long-term current use of insulin  (HCC) 04/09/2015   Dyslipidemia 04/09/2015   Benign fibroma of prostate 04/09/2015   Acid reflux 04/09/2015   History of melanoma excision 04/09/2015   History of  squamous cell carcinoma 05/23/2013   H/O adenomatous polyp of colon 08/25/2010   History of MI (myocardial infarction) 08/27/1979    Past Surgical History:  Procedure Laterality Date   CATARACT EXTRACTION W/ INTRAOCULAR LENS  IMPLANT, BILATERAL     CIRCUMCISION     COLONOSCOPY WITH PROPOFOL  N/A 11/17/2021   Procedure: COLONOSCOPY WITH PROPOFOL ;  Surgeon: Therisa Bi, MD;   Location: [facility] ENDOSCOPY;  Service: Gastroenterology;  Laterality: N/A;   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/10/2019   GANGLION CYST EXCISION     LEFT HAND   LUMBAR LAMINECTOMY/DECOMPRESSION MICRODISCECTOMY  09/13/2017   Procedure: BILATERAL Lumbar three-four , Left  Lumbar four-five  AND Left  L5-S1, LAMINECTOMY/FORAMINOTOMY;  Surgeon: Onetha Kuba, MD;  Location: [facility] OR;  Service: Neurosurgery;;   MELANOMA EXCISION  07/2011   TONSILLECTOMY     VASECTOMY      Family History  Problem Relation Age of Onset   Heart disease Mother    Diabetes Mother        type 2   Angina Mother    Pneumonia Father    Heart attack Father    Heart disease Brother    Alcoholism Daughter    Cirrhosis Daughter    Cancer Son 98       liver cancer    Social History   Tobacco Use   Smoking status: Former    Current packs/day: 0.00    Average packs/day: 2.0 packs/day for 13.0 years (26.0 ttl pk-yrs)    Types: Cigarettes    Start date: 58    Quit date: 1963    Years since quitting: 62.7   Smokeless tobacco: Never   Tobacco comments:    smoking cessation materials not required  Substance Use Topics   Alcohol use: Yes    Alcohol/week: 2.0 standard drinks of alcohol    Types: 2 Shots of liquor per week    Comment: DAILY     Current Outpatient Medications:    Accu-Chek FastClix Lancets MISC, CHECK  BLOOD  SUGAR TWICE DAILY, Disp: 204 each, Rfl: 1   allopurinol  (ZYLOPRIM ) 300 MG tablet, Take 1 tablet (300 mg total) by mouth daily., Disp: 90 tablet, Rfl: 1   amLODipine -benazepril  (LOTREL) 10-20 MG capsule, TAKE 1 CAPSULE EVERY DAY (DOSE CHANGE), Disp: 90 capsule, Rfl: 0   aspirin  81 MG tablet, Take 81 mg by mouth., Disp: , Rfl:    atorvastatin  (LIPITOR) 10 MG tablet, TAKE 1 TABLET DAILY AT 6 PM., Disp: 90 tablet, Rfl: 3   Blood Glucose Monitoring Suppl (ACCU-CHEK NANO SMARTVIEW) W/DEVICE KIT, USE AS DIRECTED, Disp: 1 kit, Rfl: 0   Cholecalciferol  (VITAMIN D ) 2000 units tablet, Take 2,000 Units by mouth  daily., Disp: , Rfl:    clobetasol ointment (TEMOVATE) 0.05 %, Apply topically 2 (two) times daily., Disp: , Rfl:    Continuous Glucose Sensor (DEXCOM G7 SENSOR) MISC, USE AS DIRECTED. CHANGE SENSOR EVERY 10 DAYS AS DIRECTED, Disp: 9 each, Rfl: 0   dorzolamide -timolol  (COSOPT) 22.3-6.8 MG/ML ophthalmic solution, Place 1 drop into the left eye daily at 2 PM., Disp: , Rfl:    doxazosin  (CARDURA ) 1 MG tablet, TAKE 1 TABLET EVERY DAY, Disp: 90 tablet, Rfl: 0   DROPLET PEN NEEDLES 31G X 5 MM MISC, USE AS NEEDED., Disp: 100 each, Rfl: 3   finasteride  (PROSCAR ) 5 MG tablet, TAKE 1 TABLET AT BEDTIME, Disp: 90 tablet, Rfl: 0   fluticasone  (FLONASE ) 50 MCG/ACT nasal spray, 2 sprays daily. TAKES PRN, Disp: , Rfl:  JARDIANCE  10 MG TABS tablet, TAKE 1 TABLET EVERY DAY, Disp: 90 tablet, Rfl: 0   Lancet Devices (TRUEDRAW LANCING DEVICE) MISC, 1 each by Does not apply route once for 1 dose., Disp: 1 each, Rfl: 0   LANTUS  SOLOSTAR 100 UNIT/ML Solostar Pen, INJECT 46 UNITS INTO THE SKIN DAILY., Disp: 45 mL, Rfl: 3   Multiple Vitamins-Minerals (CENTRUM SILVER 50+MEN) TABS, Take 1 tablet by mouth daily., Disp: , Rfl:    omeprazole  (PRILOSEC) 40 MG capsule, TAKE 1 CAPSULE EVERY DAY, Disp: 90 capsule, Rfl: 0   polyethylene glycol powder (GLYCOLAX /MIRALAX ) 17 GM/SCOOP powder, Take 17 g by mouth daily., Disp: 3350 g, Rfl: 1   prednisoLONE acetate (PRED FORTE) 1 % ophthalmic suspension, Place into the right eye., Disp: , Rfl:    Dupilumab (DUPIXENT) 300 MG/2ML SOAJ, Inject 2 mLs into the skin every 14 (fourteen) days. (Patient not taking: Reported on 06/14/2024), Disp: , Rfl:   Allergies  Allergen Reactions   Tetracyclines & Related Other (See Comments)    UNSPECIFIED REACTION  other    I personally reviewed active problem list, medication list, allergies, family history with the patient/caregiver today.   ROS  Ten systems reviewed and is negative except as mentioned in HPI    Objective Physical  Exam CONSTITUTIONAL: Patient appears well-developed and well-nourished. No distress. HEENT: Head atraumatic, normocephalic, neck supple. CARDIOVASCULAR: Normal rate, regular rhythm and normal heart sounds. No murmur heard. No BLE edema. PULMONARY: Effort normal and breath sounds normal. No respiratory distress. ABDOMINAL: There is no tenderness or distention. MUSCULOSKELETAL: Normal gait. Without gross motor or sensory deficit. Shoulder, elbow, hand, spine, and hip examined and appear normal. PSYCHIATRIC: Patient has a normal mood and affect. Behavior is normal. Judgment and thought content normal.  Vitals:   06/14/24 0829  BP: 120/70  Pulse: 82  Resp: 16  SpO2: 97%  Weight: 163 lb 9.6 oz (74.2 kg)  Height: 5' 10 (1.778 m)    Body mass index is 23.47 kg/m.  Recent Results (from the past 2160 hours)  CBG monitoring, ED     Status: Abnormal   Collection Time: 05/04/24  4:19 PM  Result Value Ref Range   Glucose-Capillary 266 (H) 70 - 99 mg/dL    Comment: Glucose reference range applies only to samples taken after fasting for at least 8 hours.  Basic metabolic panel     Status: Abnormal   Collection Time: 05/04/24  4:22 PM  Result Value Ref Range   Sodium 138 135 - 145 mmol/L   Potassium 4.0 3.5 - 5.1 mmol/L   Chloride 107 98 - 111 mmol/L   CO2 21 (L) 22 - 32 mmol/L   Glucose, Bld 253 (H) 70 - 99 mg/dL    Comment: Glucose reference range applies only to samples taken after fasting for at least 8 hours.   BUN 24 (H) 8 - 23 mg/dL   Creatinine, Ser 8.92 0.61 - 1.24 mg/dL   Calcium  8.7 (L) 8.9 - 10.3 mg/dL   GFR, Estimated >39 >39 mL/min    Comment: (NOTE) Calculated using the CKD-EPI Creatinine Equation (2021)    Anion gap 10 5 - 15    Comment: Performed at [facility], [address] [place], [place] [address]  CBC     Status: Abnormal   Collection Time: 05/04/24  4:22 PM  Result Value Ref Range   WBC 6.7 4.0 - 10.5 K/uL   RBC 3.92 (L) 4.22 - 5.81 MIL/uL    Hemoglobin 13.4  13.0 - 17.0 g/dL   HCT 61.3 (L) 60.9 - 47.9 %   MCV 98.5 80.0 - 100.0 fL   MCH 34.2 (H) 26.0 - 34.0 pg   MCHC 34.7 30.0 - 36.0 g/dL   RDW 85.9 88.4 - 84.4 %   Platelets 139 (L) 150 - 400 K/uL   nRBC 0.0 0.0 - 0.2 %    Comment: Performed at [facility], [address] [place], [place] [address]  Urinalysis, Routine w reflex microscopic -Urine, Clean Catch     Status: Abnormal   Collection Time: 05/04/24  6:16 PM  Result Value Ref Range   Color, Urine STRAW (A) YELLOW   APPearance CLEAR (A) CLEAR   Specific Gravity, Urine 1.023 1.005 - 1.030   pH 5.0 5.0 - 8.0   Glucose, UA >=500 (A) NEGATIVE mg/dL   Hgb urine dipstick NEGATIVE NEGATIVE   Bilirubin Urine NEGATIVE NEGATIVE   Ketones, ur 5 (A) NEGATIVE mg/dL   Protein, ur NEGATIVE NEGATIVE mg/dL   Nitrite NEGATIVE NEGATIVE   Leukocytes,Ua NEGATIVE NEGATIVE   RBC / HPF 0 0 - 5 RBC/hpf   WBC, UA 0 0 - 5 WBC/hpf   Bacteria, UA NONE SEEN NONE SEEN   Squamous Epithelial / HPF 0-5 0 - 5 /HPF    Comment: Performed at [facility], [address] [place], [place] [address]  CBG monitoring, ED     Status: Abnormal   Collection Time: 05/04/24  8:45 PM  Result Value Ref Range   Glucose-Capillary 106 (H) 70 - 99 mg/dL    Comment: Glucose reference range applies only to samples taken after fasting for at least 8 hours.  POCT glycosylated hemoglobin (Hb A1C)     Status: Abnormal   Collection Time: 06/14/24  8:31 AM  Result Value Ref Range   Hemoglobin A1C 6.9 (A) 4.0 - 5.6 %   HbA1c POC (<> result, manual entry)     HbA1c, POC (prediabetic range)     HbA1c, POC (controlled diabetic range)      Diabetic Foot Exam:     PHQ2/9:    06/14/2024    8:27 AM 02/22/2024    3:31 PM 02/09/2024   10:43 AM 01/10/2024    8:43 AM 09/28/2023    7:48 AM  Depression screen PHQ 2/9  Decreased Interest 0 0 0 0 0  Down, Depressed, Hopeless 0 0 0 0 0  PHQ - 2 Score 0 0 0 0 0  Altered sleeping  0 0 0 0  Tired,  decreased energy  0 0 0 0  Change in appetite  0 0 0 0  Feeling bad or failure about yourself   0 0 0 0  Trouble concentrating  0 0 0 0  Moving slowly or fidgety/restless  0 0 0 0  Suicidal thoughts  0 0 0 0  PHQ-9 Score  0 0 0 0  Difficult doing work/chores  Not difficult at all Not difficult at all Not difficult at all Not difficult at all    phq 9 is negative  Fall Risk:    06/14/2024    8:26 AM 02/22/2024    3:34 PM 02/09/2024   10:43 AM 01/10/2024    8:43 AM 09/28/2023    7:48 AM  Fall Risk   Falls in the past year? 0 1 0 0 1  Number falls in past yr: 0 1 0 0 1  Injury with Fall? 0 0 0 0 1  Risk for fall  due to : No Fall Risks  No Fall Risks No Fall Risks Impaired balance/gait  Follow up Falls evaluation completed Falls evaluation completed;Falls prevention discussed Falls prevention discussed;Education provided;Falls evaluation completed Falls prevention discussed;Education provided;Falls evaluation completed Education provided;Falls evaluation completed;Falls prevention discussed      Assessment & Plan Type 2 diabetes mellitus with chronic kidney disease stage 3 a, HTN and microalbuminuria and dyslipidemia Well-controlled with Hemoglobin A1c at 6.9%. - Increase Jardiance  to 25 mg daily. - Continue Lantus  42 units daily. - Follow up with endocrinologist in December.  Deltoid bursitis, right shoulder Causing pain, cortisone injection not preferred due to potential impact on blood glucose levels. - Prescribe Celebrex for one week for inflammation and pain management. - Advise to avoid movements that exacerbate pain and apply ice to the affected area. - Send Celebrex prescription to mail order pharmacy.  Gastroesophageal reflux disease Well-controlled with current medication regimen. - Continue omeprazole  40 mg daily while taking Celebrex to protect the stomach. - Reduce omeprazole  from 40 mg to 20 mg daily after completing Celebrex course.  Benign prostatic hyperplasia  with lower urinary tract symptoms Symptoms well-controlled with finasteride .  Gout Well-controlled with allopurinol .  Thrombocytopenia We will recheck labs next visit   General Health Maintenance Received flu vaccination. Discussed dietary calcium  intake for bone health. - Encourage high calcium  diet with foods like broccoli and fortified orange juice.

## 2024-06-20 ENCOUNTER — Other Ambulatory Visit: Payer: Self-pay | Admitting: Family Medicine

## 2024-06-20 DIAGNOSIS — N401 Enlarged prostate with lower urinary tract symptoms: Secondary | ICD-10-CM

## 2024-06-29 ENCOUNTER — Other Ambulatory Visit: Payer: Self-pay | Admitting: Family Medicine

## 2024-07-15 ENCOUNTER — Other Ambulatory Visit: Payer: Self-pay | Admitting: Family Medicine

## 2024-07-15 DIAGNOSIS — M7552 Bursitis of left shoulder: Secondary | ICD-10-CM

## 2024-07-15 DIAGNOSIS — N401 Enlarged prostate with lower urinary tract symptoms: Secondary | ICD-10-CM

## 2024-07-24 DIAGNOSIS — R809 Proteinuria, unspecified: Secondary | ICD-10-CM | POA: Diagnosis not present

## 2024-07-24 DIAGNOSIS — Z794 Long term (current) use of insulin: Secondary | ICD-10-CM | POA: Diagnosis not present

## 2024-07-24 DIAGNOSIS — E1129 Type 2 diabetes mellitus with other diabetic kidney complication: Secondary | ICD-10-CM | POA: Diagnosis not present

## 2024-07-24 LAB — HEMOGLOBIN A1C: Hemoglobin A1C: 6.7

## 2024-08-13 DIAGNOSIS — L2089 Other atopic dermatitis: Secondary | ICD-10-CM | POA: Diagnosis not present

## 2024-08-13 DIAGNOSIS — L570 Actinic keratosis: Secondary | ICD-10-CM | POA: Diagnosis not present

## 2024-09-07 ENCOUNTER — Other Ambulatory Visit: Payer: Self-pay | Admitting: Family Medicine

## 2024-09-07 DIAGNOSIS — M7552 Bursitis of left shoulder: Secondary | ICD-10-CM

## 2024-09-10 NOTE — Telephone Encounter (Signed)
 Requested Prescriptions  Pending Prescriptions Disp Refills   celecoxib  (CELEBREX ) 100 MG capsule [Pharmacy Med Name: CELECOXIB  100 MG Oral Capsule] 180 capsule 0    Sig: TAKE 1 CAPSULE TWICE DAILY AS NEEDED FOR BURSITIS OR JOINT PAINS     Analgesics:  COX2 Inhibitors Failed - 09/10/2024  9:42 AM      Failed - Manual Review: Labs are only required if the patient has taken medication for more than 8 weeks.      Failed - HCT in normal range and within 360 days    HCT  Date Value Ref Range Status  05/04/2024 38.6 (L) 39.0 - 52.0 % Final  02/11/2009 38.5 (L) 38.7 - 49.9 % Final         Passed - HGB in normal range and within 360 days    Hemoglobin  Date Value Ref Range Status  05/04/2024 13.4 13.0 - 17.0 g/dL Final   HGB  Date Value Ref Range Status  02/11/2009 13.7 13.0 - 17.1 g/dL Final         Passed - Cr in normal range and within 360 days    Creat  Date Value Ref Range Status  10/05/2023 1.29 (H) 0.70 - 1.22 mg/dL Final   Creatinine, Ser  Date Value Ref Range Status  05/04/2024 1.07 0.61 - 1.24 mg/dL Final   Creatinine, Urine  Date Value Ref Range Status  10/05/2023 59 20 - 320 mg/dL Final         Passed - AST in normal range and within 360 days    AST  Date Value Ref Range Status  10/05/2023 20 10 - 35 U/L Final  02/11/2009 38 11 - 38 U/L Final         Passed - ALT in normal range and within 360 days    ALT  Date Value Ref Range Status  10/05/2023 22 9 - 46 U/L Final   ALT(SGPT)  Date Value Ref Range Status  02/11/2009 63 (H) 10 - 47 U/L Final         Passed - eGFR is 30 or above and within 360 days    GFR, Est African American  Date Value Ref Range Status  06/17/2020 70 > OR = 60 mL/min/1.42m2 Final   GFR, Est Non African American  Date Value Ref Range Status  06/17/2020 60 > OR = 60 mL/min/1.51m2 Final   GFR, Estimated  Date Value Ref Range Status  05/04/2024 >60 >60 mL/min Final    Comment:    (NOTE) Calculated using the CKD-EPI Creatinine  Equation (2021)    eGFR  Date Value Ref Range Status  10/05/2023 52 (L) > OR = 60 mL/min/1.81m2 Final         Passed - Patient is not pregnant      Passed - Valid encounter within last 12 months    Recent Outpatient Visits           2 months ago Hypertension associated with stage 3a chronic kidney disease due to type 2 diabetes mellitus [facility])   [facility] Glenard Mire, MD   7 months ago Hypertension associated with stage 3a chronic kidney disease due to type 2 diabetes mellitus [facility])   [facility] Glenard Mire, MD   8 months ago Hypertension associated with stage 3a chronic kidney disease due to type 2 diabetes mellitus [facility])   [facility] Sowles, Krichna, MD

## 2024-10-15 ENCOUNTER — Ambulatory Visit: Admitting: Family Medicine

## 2024-10-15 ENCOUNTER — Encounter: Payer: Self-pay | Admitting: Family Medicine

## 2024-10-15 VITALS — BP 116/74 | HR 80 | Resp 16 | Ht 70.0 in | Wt 166.8 lb

## 2024-10-15 DIAGNOSIS — N1831 Chronic kidney disease, stage 3a: Secondary | ICD-10-CM | POA: Diagnosis not present

## 2024-10-15 DIAGNOSIS — Z794 Long term (current) use of insulin: Secondary | ICD-10-CM | POA: Diagnosis not present

## 2024-10-15 DIAGNOSIS — M109 Gout, unspecified: Secondary | ICD-10-CM | POA: Diagnosis not present

## 2024-10-15 DIAGNOSIS — N401 Enlarged prostate with lower urinary tract symptoms: Secondary | ICD-10-CM | POA: Diagnosis not present

## 2024-10-15 DIAGNOSIS — K219 Gastro-esophageal reflux disease without esophagitis: Secondary | ICD-10-CM

## 2024-10-15 DIAGNOSIS — E538 Deficiency of other specified B group vitamins: Secondary | ICD-10-CM

## 2024-10-15 DIAGNOSIS — I129 Hypertensive chronic kidney disease with stage 1 through stage 4 chronic kidney disease, or unspecified chronic kidney disease: Secondary | ICD-10-CM | POA: Diagnosis not present

## 2024-10-15 DIAGNOSIS — M150 Primary generalized (osteo)arthritis: Secondary | ICD-10-CM | POA: Diagnosis not present

## 2024-10-15 DIAGNOSIS — E1122 Type 2 diabetes mellitus with diabetic chronic kidney disease: Secondary | ICD-10-CM

## 2024-10-15 DIAGNOSIS — Z7984 Long term (current) use of oral hypoglycemic drugs: Secondary | ICD-10-CM | POA: Diagnosis not present

## 2024-10-15 DIAGNOSIS — L308 Other specified dermatitis: Secondary | ICD-10-CM

## 2024-10-15 DIAGNOSIS — E785 Hyperlipidemia, unspecified: Secondary | ICD-10-CM

## 2024-10-15 DIAGNOSIS — E1169 Type 2 diabetes mellitus with other specified complication: Secondary | ICD-10-CM

## 2024-10-15 LAB — MICROALBUMIN / CREATININE URINE RATIO
Creatinine, Urine: 67 mg/dL (ref 20–320)
Microalb Creat Ratio: 109 mg/g{creat} — ABNORMAL HIGH
Microalb, Ur: 7.3 mg/dL

## 2024-10-15 MED ORDER — AMLODIPINE BESY-BENAZEPRIL HCL 5-40 MG PO CAPS: 1.0000 | ORAL_CAPSULE | Freq: Every day | ORAL | 0 refills | Status: AC

## 2024-10-15 MED ORDER — ACETAMINOPHEN 500 MG PO TABS: 500.0000 mg | ORAL_TABLET | Freq: Two times a day (BID) | ORAL | 1 refills | Status: AC

## 2024-10-15 MED ORDER — DEXCOM G7 15 DAY SENSOR MISC: 1.0000 | 1 refills | Status: AC

## 2024-10-15 MED ORDER — DICLOFENAC SODIUM 1 % EX GEL: 2.0000 g | Freq: Four times a day (QID) | CUTANEOUS | 1 refills | Status: AC

## 2024-10-15 MED ORDER — LANTUS SOLOSTAR 100 UNIT/ML ~~LOC~~ SOPN: 38.0000 [IU] | PEN_INJECTOR | Freq: Every day | SUBCUTANEOUS | 1 refills | Status: AC

## 2024-10-15 MED ORDER — ALLOPURINOL 300 MG PO TABS: 300.0000 mg | ORAL_TABLET | Freq: Every day | ORAL | 1 refills | Status: AC

## 2024-10-16 ENCOUNTER — Ambulatory Visit: Payer: Self-pay | Admitting: Family Medicine

## 2024-10-16 LAB — LIPID PANEL
Cholesterol: 127 mg/dL
HDL: 50 mg/dL
LDL Cholesterol (Calc): 53 mg/dL
Non-HDL Cholesterol (Calc): 77 mg/dL
Total CHOL/HDL Ratio: 2.5 (calc)
Triglycerides: 165 mg/dL — ABNORMAL HIGH

## 2024-10-16 LAB — CBC WITH DIFFERENTIAL/PLATELET
Absolute Lymphocytes: 2158 {cells}/uL (ref 850–3900)
Absolute Monocytes: 660 {cells}/uL (ref 200–950)
Basophils Absolute: 57 {cells}/uL (ref 0–200)
Basophils Relative: 0.8 %
Eosinophils Absolute: 362 {cells}/uL (ref 15–500)
Eosinophils Relative: 5.1 %
HCT: 42.2 % (ref 39.4–51.1)
Hemoglobin: 14 g/dL (ref 13.2–17.1)
MCH: 34 pg — ABNORMAL HIGH (ref 27.0–33.0)
MCHC: 33.2 g/dL (ref 31.6–35.4)
MCV: 102.4 fL — ABNORMAL HIGH (ref 81.4–101.7)
MPV: 11.9 fL (ref 7.5–12.5)
Monocytes Relative: 9.3 %
Neutro Abs: 3862 {cells}/uL (ref 1500–7800)
Neutrophils Relative %: 54.4 %
Platelets: 166 10*3/uL (ref 140–400)
RBC: 4.12 Million/uL — ABNORMAL LOW (ref 4.20–5.80)
RDW: 13.3 % (ref 11.0–15.0)
Total Lymphocyte: 30.4 %
WBC: 7.1 10*3/uL (ref 3.8–10.8)

## 2024-10-16 LAB — B12 AND FOLATE PANEL
Folate: >24 ng/mL
Vitamin B-12: 671 pg/mL (ref 200–1100)

## 2024-10-16 LAB — COMPREHENSIVE METABOLIC PANEL WITH GFR
AG Ratio: 1.9 (calc) (ref 1.0–2.5)
ALT: 26 U/L (ref 9–46)
AST: 22 U/L (ref 10–35)
Albumin: 4.3 g/dL (ref 3.6–5.1)
Alkaline phosphatase (APISO): 62 U/L (ref 35–144)
BUN/Creatinine Ratio: 27 (calc) — ABNORMAL HIGH (ref 6–22)
BUN: 37 mg/dL — ABNORMAL HIGH (ref 7–25)
CO2: 25 mmol/L (ref 20–32)
Calcium: 9.1 mg/dL (ref 8.6–10.3)
Chloride: 106 mmol/L (ref 98–110)
Creat: 1.37 mg/dL — ABNORMAL HIGH (ref 0.70–1.22)
Globulin: 2.3 g/dL (ref 1.9–3.7)
Glucose, Bld: 156 mg/dL — ABNORMAL HIGH (ref 65–99)
Potassium: 4.5 mmol/L (ref 3.5–5.3)
Sodium: 138 mmol/L (ref 135–146)
Total Bilirubin: 0.5 mg/dL (ref 0.2–1.2)
Total Protein: 6.6 g/dL (ref 6.1–8.1)
eGFR: 48 mL/min/{1.73_m2} — ABNORMAL LOW

## 2024-10-16 LAB — VITAMIN D 25 HYDROXY (VIT D DEFICIENCY, FRACTURES): Vit D, 25-Hydroxy: 45 ng/mL (ref 30–100)

## 2025-03-06 ENCOUNTER — Ambulatory Visit
